# Patient Record
Sex: Female | Born: 1950 | Race: White | Hispanic: No | Marital: Married | Smoking: Never smoker
Health system: Southern US, Community
[De-identification: ages and names within clinical notes are randomized; demographics above are authoritative.]

## PROBLEM LIST (undated history)

## (undated) DIAGNOSIS — I10 Essential (primary) hypertension: Secondary | ICD-10-CM

## (undated) DIAGNOSIS — Z8719 Personal history of other diseases of the digestive system: Secondary | ICD-10-CM

## (undated) DIAGNOSIS — E7800 Pure hypercholesterolemia, unspecified: Secondary | ICD-10-CM

## (undated) DIAGNOSIS — M1990 Unspecified osteoarthritis, unspecified site: Secondary | ICD-10-CM

## (undated) DIAGNOSIS — E119 Type 2 diabetes mellitus without complications: Secondary | ICD-10-CM

## (undated) DIAGNOSIS — E785 Hyperlipidemia, unspecified: Secondary | ICD-10-CM

## (undated) HISTORY — PX: CHOLECYSTECTOMY: SHX55

## (undated) HISTORY — PX: TUBAL LIGATION: SHX77

---

## 2010-07-04 ENCOUNTER — Ambulatory Visit (HOSPITAL_COMMUNITY)
Admission: RE | Admit: 2010-07-04 | Discharge: 2010-07-04 | Payer: Self-pay | Source: Home / Self Care | Attending: Family Medicine | Admitting: Family Medicine

## 2010-07-18 ENCOUNTER — Other Ambulatory Visit: Payer: Self-pay | Admitting: Family Medicine

## 2010-07-18 DIAGNOSIS — R928 Other abnormal and inconclusive findings on diagnostic imaging of breast: Secondary | ICD-10-CM

## 2010-07-24 ENCOUNTER — Ambulatory Visit (HOSPITAL_COMMUNITY)
Admission: RE | Admit: 2010-07-24 | Discharge: 2010-07-24 | Disposition: A | Discharge status: Home / Self Care | Payer: BC Managed Care – PPO | Source: Ambulatory Visit | Attending: Family Medicine | Admitting: Family Medicine

## 2010-07-24 DIAGNOSIS — R928 Other abnormal and inconclusive findings on diagnostic imaging of breast: Secondary | ICD-10-CM

## 2011-02-06 ENCOUNTER — Other Ambulatory Visit: Payer: Self-pay | Admitting: Family Medicine

## 2011-02-06 DIAGNOSIS — R928 Other abnormal and inconclusive findings on diagnostic imaging of breast: Secondary | ICD-10-CM

## 2011-02-20 ENCOUNTER — Ambulatory Visit
Admission: RE | Admit: 2011-02-20 | Discharge: 2011-02-20 | Disposition: A | Discharge status: Home / Self Care | Payer: BC Managed Care – PPO | Source: Ambulatory Visit | Attending: Family Medicine | Admitting: Family Medicine

## 2011-02-20 DIAGNOSIS — R928 Other abnormal and inconclusive findings on diagnostic imaging of breast: Secondary | ICD-10-CM

## 2013-02-23 ENCOUNTER — Other Ambulatory Visit (HOSPITAL_COMMUNITY): Payer: Self-pay | Admitting: Family Medicine

## 2013-04-08 ENCOUNTER — Telehealth: Payer: Self-pay | Admitting: Family Medicine

## 2013-04-08 ENCOUNTER — Other Ambulatory Visit (HOSPITAL_COMMUNITY): Payer: Self-pay | Admitting: Family Medicine

## 2013-04-08 NOTE — Telephone Encounter (Signed)
Patient would like to know if Dr. Gerda Diss would refill lisinopril-hydrochlorothiazide (PRINZIDE,ZESTORETIC) 10-12.5 MG per tablet until the first of the year.  States her Insurance will be changing as well as she has an appointment elsewhere in November and she would like to bring those results with her as well to the office visit.  Call patient to discuss further.

## 2013-04-08 NOTE — Telephone Encounter (Signed)
Pt has not been seen since EPIC. We last refilled this prescription on 02/23/13 stating she needs an office visit for further refills.

## 2013-04-13 MED ORDER — LISINOPRIL-HYDROCHLOROTHIAZIDE 10-12.5 MG PO TABS: ORAL_TABLET | ORAL | Status: DC

## 2013-04-13 NOTE — Telephone Encounter (Signed)
Rx sent electronically to pharmacy. Patient notified. 

## 2013-04-13 NOTE — Telephone Encounter (Signed)
Tell her generally we want to see htn pts q 6 mos but will do this time

## 2013-05-04 ENCOUNTER — Encounter: Payer: Self-pay | Admitting: Family Medicine

## 2013-05-04 ENCOUNTER — Ambulatory Visit (INDEPENDENT_AMBULATORY_CARE_PROVIDER_SITE_OTHER): Payer: BC Managed Care – PPO | Admitting: Family Medicine

## 2013-05-04 VITALS — BP 132/80 | Ht 63.75 in | Wt 191.4 lb

## 2013-05-04 DIAGNOSIS — E781 Pure hyperglyceridemia: Secondary | ICD-10-CM | POA: Insufficient documentation

## 2013-05-04 DIAGNOSIS — Z23 Encounter for immunization: Secondary | ICD-10-CM

## 2013-05-04 DIAGNOSIS — E785 Hyperlipidemia, unspecified: Secondary | ICD-10-CM

## 2013-05-04 DIAGNOSIS — I10 Essential (primary) hypertension: Secondary | ICD-10-CM | POA: Insufficient documentation

## 2013-05-04 MED ORDER — LISINOPRIL-HYDROCHLOROTHIAZIDE 10-12.5 MG PO TABS: ORAL_TABLET | ORAL | Status: DC

## 2013-05-04 NOTE — Progress Notes (Signed)
  Subjective:    Patient ID: ARTRICE KRAKER, female    DOB: November 29, 1950, 62 y.o.   MRN: [id number]  HPI  Patient arrives to follow up on blood pressure. Bp elsewhere is genrally good when on the med, not when not. Trying to watch salt intake. Not exercising as much as she had hoped.  Not so good on diet,  Five and two keeps kids during the day.  No problems or concerns.  Exercising by staying active  History of elevated triglycerides and cholesterol. We have spoken with her multiple times about this in the past. She has always been reluctant to get further testing. There is family history of hyperlipidemia.  Review of Systems No chest pain no headache no back pain no abdominal pain ROS otherwise negative    Objective:   Physical Exam Alert HEENT normal. Lungs clear. Heart regular in rhythm. Ankles no significant edema pulses good sensation good       Assessment & Plan:  Impression 1 hypertension good control. #2 hyperlipidemia status uncertain long discussion held. Patient's triglycerides often exceed 4-500. This is definitely a big risk factor. Plan appropriate blood work your triglycerides still up patient will and a take medicine. 25 minutes spent most in discussion flu shot today. Check every 6 months WSL

## 2014-04-12 ENCOUNTER — Ambulatory Visit (INDEPENDENT_AMBULATORY_CARE_PROVIDER_SITE_OTHER): Payer: 59 | Admitting: Family Medicine

## 2014-04-12 ENCOUNTER — Encounter: Payer: Self-pay | Admitting: Family Medicine

## 2014-04-12 VITALS — BP 130/90 | Ht 63.75 in | Wt 190.5 lb

## 2014-04-12 DIAGNOSIS — Z23 Encounter for immunization: Secondary | ICD-10-CM

## 2014-04-12 DIAGNOSIS — J310 Chronic rhinitis: Secondary | ICD-10-CM

## 2014-04-12 DIAGNOSIS — I10 Essential (primary) hypertension: Secondary | ICD-10-CM

## 2014-04-12 DIAGNOSIS — J329 Chronic sinusitis, unspecified: Secondary | ICD-10-CM

## 2014-04-12 DIAGNOSIS — Z79899 Other long term (current) drug therapy: Secondary | ICD-10-CM

## 2014-04-12 DIAGNOSIS — E781 Pure hyperglyceridemia: Secondary | ICD-10-CM

## 2014-04-12 LAB — BASIC METABOLIC PANEL
BUN: 19 mg/dL (ref 6–23)
CHLORIDE: 101 meq/L (ref 96–112)
CO2: 28 mEq/L (ref 19–32)
Calcium: 10.2 mg/dL (ref 8.4–10.5)
Creat: 0.88 mg/dL (ref 0.50–1.10)
Glucose, Bld: 93 mg/dL (ref 70–99)
POTASSIUM: 5.1 meq/L (ref 3.5–5.3)
Sodium: 139 mEq/L (ref 135–145)

## 2014-04-12 LAB — LIPID PANEL
Cholesterol: 326 mg/dL — ABNORMAL HIGH (ref 0–200)
HDL: 45 mg/dL (ref >39)
TRIGLYCERIDES: 464 mg/dL — AB (ref <150)
Total CHOL/HDL Ratio: 7.2 Ratio

## 2014-04-12 LAB — HEPATIC FUNCTION PANEL
ALBUMIN: 4.6 g/dL (ref 3.5–5.2)
ALT: 18 U/L (ref 0–35)
AST: 19 U/L (ref 0–37)
Alkaline Phosphatase: 56 U/L (ref 39–117)
Bilirubin, Direct: <0.1 mg/dL (ref 0.0–0.3)
TOTAL PROTEIN: 7.4 g/dL (ref 6.0–8.3)
Total Bilirubin: 0.3 mg/dL (ref 0.2–1.2)

## 2014-04-12 MED ORDER — GENTAMICIN SULFATE 0.3 % OP SOLN: 2.0000 [drp] | Freq: Four times a day (QID) | OPHTHALMIC | Status: DC

## 2014-04-12 MED ORDER — LISINOPRIL-HYDROCHLOROTHIAZIDE 10-12.5 MG PO TABS: ORAL_TABLET | ORAL | Status: DC

## 2014-04-12 MED ORDER — AZITHROMYCIN 250 MG PO TABS: ORAL_TABLET | ORAL | Status: DC

## 2014-04-12 NOTE — Progress Notes (Signed)
   Subjective:    Patient ID: Diane Watkins, female    DOB: Jun 02, 1951, 63 y.o.   MRN: [id number]  Hypertension This is a chronic problem. The current episode started more than 1 year ago. The problem has been gradually improving since onset. The problem is controlled. There are no associated agents to hypertension. There are no known risk factors for coronary artery disease. Treatments tried: lisinopril-hctz. The current treatment provides significant improvement. There are no compliance problems.    Patient states that her left eye has redness that has been present for about 3 days. 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  Needs b w,   Hx of high triglycerides, not watching diet til recently. Not exercising that much. Compliant with diet only recently. Some family history of elevated triglycerides also.  Pt took flu shot  Tod flu shot    No knoewn exposureallergies act up,   Review of Systems No headache no chest pain and back pain abdominal pain no change in bowel habits no blood in stool ROS otherwise negative    Objective:   Physical Exam Alert no acute distress. Blood pressure good on repeat.HEENT moderate nasal congestion frontal tenderness left eye somewhat crusty pharynx normal neck supple. Lungs clear heart regular rate and rhythm.       Assessment & Plan:  Impression 1 hypertension good control #2 hyperlipidemia status uncertain. #3 rhinosinusitis with accompanying conjunctivitis discuss plan antibiotics prescribed. Symptomatically care discussed. Appropriate blood work. Medications refilled. Diet exercise discussed. WS L

## 2014-04-21 ENCOUNTER — Other Ambulatory Visit: Payer: Self-pay | Admitting: *Deleted

## 2014-04-21 DIAGNOSIS — E785 Hyperlipidemia, unspecified: Secondary | ICD-10-CM

## 2014-04-21 DIAGNOSIS — Z79899 Other long term (current) drug therapy: Secondary | ICD-10-CM

## 2014-04-21 MED ORDER — ATORVASTATIN CALCIUM 40 MG PO TABS: 40.0000 mg | ORAL_TABLET | Freq: Every day | ORAL | Status: DC

## 2014-04-24 ENCOUNTER — Telehealth: Payer: Self-pay | Admitting: Family Medicine

## 2014-04-24 NOTE — Telephone Encounter (Signed)
Her chol is more dangerous than her trigly, but we'll honor her choice. Fenofibrate 160 mg qhs numb thirty six ref

## 2014-04-24 NOTE — Telephone Encounter (Signed)
Pt states she did not fill her lipitor script but she has decided that she does not want to take this  Med and wants to try the generic for [place]

## 2014-04-25 MED ORDER — FENOFIBRATE 160 MG PO TABS: 160.0000 mg | ORAL_TABLET | Freq: Every day | ORAL | Status: DC

## 2014-04-26 NOTE — Telephone Encounter (Signed)
Fenofibrate was ordered on 11/10

## 2014-04-28 NOTE — Telephone Encounter (Signed)
Discussed with patient. Patient verbalized understanding. 

## 2014-05-03 ENCOUNTER — Telehealth: Payer: Self-pay | Admitting: Family Medicine

## 2014-05-03 NOTE — Telephone Encounter (Signed)
As long as pt understands her current chol and trigly level increases risk of stroke and heart attack we will honor her decision

## 2014-05-03 NOTE — Telephone Encounter (Signed)
Patient called stating medication you prescribe was too expensive to get and she was going to exercise more and eat more vegetable for now.

## 2014-05-03 NOTE — Telephone Encounter (Signed)
Patient advised her current chol and trigly level increases risk of stroke and heart attack. Patient verbalized understanding and stated she wants to hold on meds at this time.

## 2014-05-16 ENCOUNTER — Encounter: Payer: 59 | Admitting: Nurse Practitioner

## 2014-05-17 ENCOUNTER — Other Ambulatory Visit: Payer: Self-pay | Admitting: Nurse Practitioner

## 2014-05-17 ENCOUNTER — Ambulatory Visit (INDEPENDENT_AMBULATORY_CARE_PROVIDER_SITE_OTHER): Payer: 59 | Admitting: Nurse Practitioner

## 2014-05-17 ENCOUNTER — Encounter: Payer: Self-pay | Admitting: Nurse Practitioner

## 2014-05-17 VITALS — BP 128/82 | Ht 63.75 in | Wt 188.8 lb

## 2014-05-17 DIAGNOSIS — E781 Pure hyperglyceridemia: Secondary | ICD-10-CM

## 2014-05-17 DIAGNOSIS — M8580 Other specified disorders of bone density and structure, unspecified site: Secondary | ICD-10-CM

## 2014-05-17 DIAGNOSIS — Z124 Encounter for screening for malignant neoplasm of cervix: Secondary | ICD-10-CM

## 2014-05-17 DIAGNOSIS — Z1231 Encounter for screening mammogram for malignant neoplasm of breast: Secondary | ICD-10-CM

## 2014-05-17 DIAGNOSIS — Z0000 Encounter for general adult medical examination without abnormal findings: Secondary | ICD-10-CM

## 2014-05-17 NOTE — Progress Notes (Signed)
   Subjective:    Patient ID: Diane Watkins, female    DOB: 1951-05-25, 63 y.o.   MRN: [id number]  HPI presents for her wellness physical. Same sexual partner. No vaginal bleeding or pelvic pain. Regular vision and dental exams. Has had a skin cancer screening. Very active lifestyle. Overall healthy diet. Insurance will change in January, not sure what will be covered.    Review of Systems  Constitutional: Negative for fever, activity change, appetite change and fatigue.  HENT: Negative for dental problem, ear pain, sinus pressure and sore throat.   Respiratory: Negative for cough, chest tightness, shortness of breath and wheezing.   Cardiovascular: Negative for chest pain.  Gastrointestinal: Negative for nausea, vomiting, abdominal pain, diarrhea, constipation and abdominal distention.  Genitourinary: Negative for dysuria, urgency, frequency, vaginal bleeding, vaginal discharge, enuresis, difficulty urinating, genital sores, vaginal pain and pelvic pain.  Musculoskeletal: Positive for arthralgias.       Objective:   Physical Exam  Constitutional: She is oriented to person, place, and time. She appears well-developed. No distress.  HENT:  Right Ear: External ear normal.  Left Ear: External ear normal.  Mouth/Throat: Oropharynx is clear and moist.  Neck: Normal range of motion. Neck supple. No tracheal deviation present. No thyromegaly present.  Cardiovascular: Normal rate, regular rhythm and normal heart sounds.  Exam reveals no gallop.   No murmur heard. Pulmonary/Chest: Effort normal and breath sounds normal.  Abdominal: Soft. She exhibits no distension. There is no tenderness.  Genitourinary: Vagina normal and uterus normal. No vaginal discharge found.  External GU: pale and dry; vagina no discharge mild irritation.no CMT; bimanual exam: no tenderness or obvious masses; ovaries nonpalp but exam limited due to abd girth. Rectal exam: no masses; no stool for hemoccult.    Musculoskeletal: She exhibits no edema.  Lymphadenopathy:    She has no cervical adenopathy.  Neurological: She is alert and oriented to person, place, and time.  Skin: Skin is warm and dry. No rash noted.  Psychiatric: She has a normal mood and affect. Her behavior is normal. Thought content normal.  Vitals reviewed. Breasts: slightly dense tissue; no masses; axillae no adenopathy.        Assessment & Plan:   Problem List Items Addressed This Visit      Musculoskeletal and Integument   Osteopenia   Relevant Orders      DG Bone Density     Other   Hypertriglyceridemia    Other Visit Diagnoses    Routine general medical examination at a health care facility    -  Primary    Screening for cervical cancer        Relevant Orders       Pap IG w/ reflex to HPV when ASC-U       Lengthy discussion about TG. Minimal improvement in 3 years. Still above 400. Strongly recommend medication. Could not afford Fenofibrate. Reluctant to take Lipitor due to potential side effects. Explained that benefits outweigh risk. Risk of uncontrolled TG include stroke and MI. Also, considering her father's history this is probably genetic. May consider Crestor depending on cost. Will check into this. Defers colonoscopy and Zostavax but given Rx and info. Also plans to get DT at pharmacy. Recommend regular exercise, healthy low fat diet and weight loss. Continue daily low dose ASA.  Return in about 6 months (around 11/16/2014).

## 2014-05-18 LAB — PAP IG W/ RFLX HPV ASCU

## 2014-05-23 ENCOUNTER — Ambulatory Visit (HOSPITAL_COMMUNITY)
Admission: RE | Admit: 2014-05-23 | Discharge: 2014-05-23 | Disposition: A | Discharge status: Home / Self Care | Payer: 59 | Source: Ambulatory Visit | Attending: Nurse Practitioner | Admitting: Nurse Practitioner

## 2014-05-23 DIAGNOSIS — M8580 Other specified disorders of bone density and structure, unspecified site: Secondary | ICD-10-CM | POA: Diagnosis not present

## 2014-06-01 ENCOUNTER — Other Ambulatory Visit: Payer: Self-pay | Admitting: Nurse Practitioner

## 2014-06-01 ENCOUNTER — Ambulatory Visit
Admission: RE | Admit: 2014-06-01 | Discharge: 2014-06-01 | Disposition: A | Discharge status: Home / Self Care | Payer: 59 | Source: Ambulatory Visit | Attending: Nurse Practitioner | Admitting: Nurse Practitioner

## 2014-06-01 ENCOUNTER — Encounter (INDEPENDENT_AMBULATORY_CARE_PROVIDER_SITE_OTHER): Payer: Self-pay

## 2014-06-01 DIAGNOSIS — Z1231 Encounter for screening mammogram for malignant neoplasm of breast: Secondary | ICD-10-CM

## 2014-08-31 ENCOUNTER — Telehealth: Payer: Self-pay | Admitting: Family Medicine

## 2014-08-31 DIAGNOSIS — Z79899 Other long term (current) drug therapy: Secondary | ICD-10-CM

## 2014-08-31 DIAGNOSIS — E785 Hyperlipidemia, unspecified: Secondary | ICD-10-CM

## 2014-08-31 NOTE — Telephone Encounter (Signed)
[place] bw orders are in. Need to let pt know to go to [facility]

## 2014-08-31 NOTE — Telephone Encounter (Signed)
Pt has 6 month follow up, questions if she'll need lab work, please advise and call pt when done Appt here is 10/02/14

## 2014-08-31 NOTE — Telephone Encounter (Signed)
Last labs 04/12/14 lipid, liver, bmp. Was told to repeat lip and liver in 3 months. Does she need any additional labs

## 2014-08-31 NOTE — Telephone Encounter (Signed)
Ov lipo lliv

## 2014-09-01 NOTE — Telephone Encounter (Signed)
[facility]

## 2014-09-04 NOTE — Telephone Encounter (Signed)
Notified patient that blood work has been ordered and to report to [facility].

## 2014-09-27 LAB — LIPID PANEL
CHOL/HDL RATIO: 6.4 ratio — AB (ref 0.0–4.4)
Cholesterol, Total: 255 mg/dL — ABNORMAL HIGH (ref 100–199)
HDL: 40 mg/dL (ref >39)
LDL CALC: 144 mg/dL — AB (ref 0–99)
Triglycerides: 355 mg/dL — ABNORMAL HIGH (ref 0–149)
VLDL CHOLESTEROL CAL: 71 mg/dL — AB (ref 5–40)

## 2014-09-27 LAB — HEPATIC FUNCTION PANEL
ALBUMIN: 4.2 g/dL (ref 3.6–4.8)
ALK PHOS: 55 IU/L (ref 39–117)
ALT: 18 IU/L (ref 0–32)
AST: 20 IU/L (ref 0–40)
BILIRUBIN, DIRECT: 0.07 mg/dL (ref 0.00–0.40)
Bilirubin Total: 0.3 mg/dL (ref 0.0–1.2)
TOTAL PROTEIN: 6.6 g/dL (ref 6.0–8.5)

## 2014-10-02 ENCOUNTER — Encounter: Payer: Self-pay | Admitting: Family Medicine

## 2014-10-02 ENCOUNTER — Ambulatory Visit (INDEPENDENT_AMBULATORY_CARE_PROVIDER_SITE_OTHER): Payer: 59 | Admitting: Family Medicine

## 2014-10-02 ENCOUNTER — Ambulatory Visit (HOSPITAL_COMMUNITY)
Admission: RE | Admit: 2014-10-02 | Discharge: 2014-10-02 | Disposition: A | Discharge status: Home / Self Care | Payer: 59 | Source: Ambulatory Visit | Attending: Family Medicine | Admitting: Family Medicine

## 2014-10-02 VITALS — BP 132/80 | Ht 63.75 in | Wt 186.2 lb

## 2014-10-02 DIAGNOSIS — M25562 Pain in left knee: Secondary | ICD-10-CM

## 2014-10-02 DIAGNOSIS — E781 Pure hyperglyceridemia: Secondary | ICD-10-CM

## 2014-10-02 DIAGNOSIS — M25572 Pain in left ankle and joints of left foot: Secondary | ICD-10-CM

## 2014-10-02 DIAGNOSIS — M25472 Effusion, left ankle: Secondary | ICD-10-CM | POA: Insufficient documentation

## 2014-10-02 DIAGNOSIS — I10 Essential (primary) hypertension: Secondary | ICD-10-CM | POA: Diagnosis not present

## 2014-10-02 MED ORDER — DICLOFENAC SODIUM 75 MG PO TBEC: 75.0000 mg | DELAYED_RELEASE_TABLET | Freq: Two times a day (BID) | ORAL | Status: DC

## 2014-10-02 MED ORDER — LISINOPRIL-HYDROCHLOROTHIAZIDE 10-12.5 MG PO TABS: ORAL_TABLET | ORAL | Status: DC

## 2014-10-02 NOTE — Progress Notes (Signed)
   Subjective:    Patient ID: Diane Watkins, female    DOB: 18-Apr-1951, 64 y.o.   MRN: [id number]  Hypertension This is a chronic problem. The current episode started more than 1 year ago. The problem has been gradually improving since onset. The problem is controlled. There are no associated agents to hypertension. There are no known risk factors for coronary artery disease. Treatments tried: lisinopril-hctz. The current treatment provides significant improvement. There are no compliance problems.    Patient states that she has been having pain in her left knee and left ankle. This has been present for about 4 months now. Happened right after christmas, playing with the grandkids, seemed to twist knee, very painful, actually used a walker few days   Patient trying to work on cholesterol intake. Has cut down fats. Also taking omega supplements. Not able to exercise much with knee pain.   Still painful tho better, wear s knee brace,  otc med tried some ibuprofen and used prn, only when bad,   Mom has arthritis issues  Results for orders placed or performed in visit on 08/31/14  Lipid panel  Result Value Ref Range   Cholesterol, Total 255 (H) 100 - 199 mg/dL   Triglycerides 355 (H) 0 - 149 mg/dL   HDL 40 >39 mg/dL   VLDL Cholesterol Cal 71 (H) 5 - 40 mg/dL   LDL Calculated 144 (H) 0 - 99 mg/dL   Chol/HDL Ratio 6.4 (H) 0.0 - 4.4 ratio units  Hepatic function panel  Result Value Ref Range   Total Protein 6.6 6.0 - 8.5 g/dL   Albumin 4.2 3.6 - 4.8 g/dL   Bilirubin Total 0.3 0.0 - 1.2 mg/dL   Bilirubin, Direct 0.07 0.00 - 0.40 mg/dL   Alkaline Phosphatase 55 39 - 117 IU/L   AST 20 0 - 40 IU/L   ALT 18 0 - 32 IU/L     Review of Systems No headache no chest pain no back pain no abdominal pain no change in bowel habits    Objective:   Physical Exam  Alert no acute distress. HEENT normal. Lungs clear. Heart regular rate and rhythm. Ankles without edema Left knee positive  crepitations no obvious effusion or joint line tenderness or laxity     Assessment & Plan:  Impression #1 hypertension good control discussed #2 hyperlipidemia not good control also other risk factors and family history of heart disease discussed at length #3 progressive knee pain likely arthritis, though with flare occurring may well have experienced a meniscal or ligament strain plan patient declines cholesterol medicine. Maintain blood pressure medicine. X-ray involved knee. Voltaren twice a day with food when necessary. WSL

## 2015-04-03 ENCOUNTER — Ambulatory Visit: Payer: 59 | Admitting: Family Medicine

## 2015-05-01 ENCOUNTER — Ambulatory Visit (INDEPENDENT_AMBULATORY_CARE_PROVIDER_SITE_OTHER): Payer: 59 | Admitting: Family Medicine

## 2015-05-01 ENCOUNTER — Encounter: Payer: Self-pay | Admitting: Family Medicine

## 2015-05-01 VITALS — BP 128/82 | Ht 63.75 in | Wt 194.2 lb

## 2015-05-01 DIAGNOSIS — I10 Essential (primary) hypertension: Secondary | ICD-10-CM

## 2015-05-01 DIAGNOSIS — Z23 Encounter for immunization: Secondary | ICD-10-CM | POA: Diagnosis not present

## 2015-05-01 DIAGNOSIS — E785 Hyperlipidemia, unspecified: Secondary | ICD-10-CM

## 2015-05-01 DIAGNOSIS — M129 Arthropathy, unspecified: Secondary | ICD-10-CM | POA: Diagnosis not present

## 2015-05-01 DIAGNOSIS — M8580 Other specified disorders of bone density and structure, unspecified site: Secondary | ICD-10-CM | POA: Diagnosis not present

## 2015-05-01 DIAGNOSIS — M170 Bilateral primary osteoarthritis of knee: Secondary | ICD-10-CM

## 2015-05-01 MED ORDER — LISINOPRIL 20 MG PO TABS: 20.0000 mg | ORAL_TABLET | Freq: Every day | ORAL | Status: DC

## 2015-05-01 NOTE — Progress Notes (Signed)
   Subjective:    Patient ID: Diane Watkins, female    DOB: 07/12/1950, 64 y.o.   MRN: [id number]  Hypertension This is a chronic problem. The current episode started more than 1 year ago. Risk factors for coronary artery disease include post-menopausal state. Treatments tried: lisinopril/hctz. There are no compliance problems.     Patient would like to take off the hctz component to help improve her joint pain./  Has gained weight. Patient realizes she has a cholesterol issue. Unfortunately still eating a lot of fatty foods and not exercising.  Both knees aching and hurting  Using no sig meds, then if bad take s antiinflam. Both knees hurt although left more than right. At times needs even swell up. Wonders if HCTZ may be related  Tries not to take meds  Review of Systems No headache no chest pain no back pain abdominal pain no change in bowel habits ROS otherwise negative    Objective:   Physical Exam  Alert vital stable blood pressure 138/76 on repeat HEENT normal lungs clear heart regular in rhythm. Knees crepitations evident left greater than right no obvious effusion no joint laxity      Assessment & Plan:  Impression 1 hypertension decent control, meds reviewed #2 progressive arthritis discussed #3 perceived HCTZ side effect with patient claiming this causes increased arthritis pain #4 hyperlipidemia discussed multiple questions answered plan 25 minutes spent most in discussion. Stop HCTZ component though ACE inhibitor component diet exercise discussed Tylenol when necessary for knee pain prescription anti-inflammatory when necessary for more severe pain follow-up in 6 months lipid panel that if numbers no better will need medication rationale discussed WSL

## 2015-05-01 NOTE — Patient Instructions (Signed)
Call us about a week ahead of next visit so we can do fasting blood work

## 2015-05-16 ENCOUNTER — Other Ambulatory Visit: Payer: Self-pay

## 2015-05-16 DIAGNOSIS — Z1231 Encounter for screening mammogram for malignant neoplasm of breast: Secondary | ICD-10-CM

## 2015-06-13 ENCOUNTER — Ambulatory Visit: Payer: 59

## 2015-06-28 ENCOUNTER — Ambulatory Visit: Payer: 59

## 2015-07-02 ENCOUNTER — Ambulatory Visit (INDEPENDENT_AMBULATORY_CARE_PROVIDER_SITE_OTHER): Payer: BLUE CROSS/BLUE SHIELD | Admitting: Family Medicine

## 2015-07-02 ENCOUNTER — Encounter: Payer: Self-pay | Admitting: Family Medicine

## 2015-07-02 VITALS — BP 132/88 | Temp 98.7°F | Ht 63.0 in | Wt 197.0 lb

## 2015-07-02 DIAGNOSIS — R21 Rash and other nonspecific skin eruption: Secondary | ICD-10-CM

## 2015-07-02 MED ORDER — DOXYCYCLINE HYCLATE 100 MG PO TABS: 100.0000 mg | ORAL_TABLET | Freq: Two times a day (BID) | ORAL | Status: DC

## 2015-07-02 MED ORDER — TRIAMCINOLONE ACETONIDE 0.1 % EX CREA: 1.0000 "application " | TOPICAL_CREAM | Freq: Two times a day (BID) | CUTANEOUS | Status: DC

## 2015-07-02 NOTE — Progress Notes (Signed)
   Subjective:    Patient ID: Diane Watkins, female    DOB: 1950-11-01, 65 y.o.   MRN: [id number]  HPI Itchy rash on right arm. Came up 2 weeks before christmas. Using itch cream. Antibiotic cream and ointment, soap and water, and alcohol.    the original Spot started on left leg  Left a scar starts as a small bump. Erythematous. Sometimes tender swells more with the skin itchy slight discharge and skin breakdown X  Is now developed numerous spots on arms wonders whether she is getting bit by a spider etc.  Review of Systems  no fever no chills no cough no vomiting no diarrhea    Objective:   Physical Exam    alert vitals stable lungs clear heart rare rhythm H&T normal skin multiple discrete erythematous patches with excoriation and moderate amount of tenderness     Assessment & Plan:   impression skin structure infection/folliculitis with secondary cutaneous reaction plan antibiotics prescribed. Local measures discussed twice a day triamcinolone WSL

## 2015-11-19 ENCOUNTER — Ambulatory Visit: Payer: 59 | Admitting: Family Medicine

## 2015-11-20 ENCOUNTER — Telehealth: Payer: Self-pay | Admitting: Family Medicine

## 2015-11-20 DIAGNOSIS — I10 Essential (primary) hypertension: Secondary | ICD-10-CM

## 2015-11-20 DIAGNOSIS — E781 Pure hyperglyceridemia: Secondary | ICD-10-CM

## 2015-11-20 NOTE — Telephone Encounter (Signed)
Lip liv m7 

## 2015-11-20 NOTE — Telephone Encounter (Signed)
Pt is requesting lab orders to be sent over for an upcoming appt. Last labs per epic were: lipid and hepatic on 09/26/14

## 2015-11-20 NOTE — Telephone Encounter (Signed)
Blood work ordered in EPIC. Patient notified. 

## 2015-11-24 LAB — LIPID PANEL
CHOLESTEROL TOTAL: 315 mg/dL — AB (ref 100–199)
Chol/HDL Ratio: 7.7 ratio units — ABNORMAL HIGH (ref 0.0–4.4)
HDL: 41 mg/dL (ref >39)
Triglycerides: 421 mg/dL — ABNORMAL HIGH (ref 0–149)

## 2015-11-24 LAB — HEPATIC FUNCTION PANEL
ALBUMIN: 4.4 g/dL (ref 3.6–4.8)
ALK PHOS: 57 IU/L (ref 39–117)
ALT: 19 IU/L (ref 0–32)
AST: 20 IU/L (ref 0–40)
BILIRUBIN TOTAL: 0.3 mg/dL (ref 0.0–1.2)
BILIRUBIN, DIRECT: 0.09 mg/dL (ref 0.00–0.40)
Total Protein: 6.9 g/dL (ref 6.0–8.5)

## 2015-11-24 LAB — BASIC METABOLIC PANEL
BUN / CREAT RATIO: 21 (ref 12–28)
BUN: 19 mg/dL (ref 8–27)
CALCIUM: 10.2 mg/dL (ref 8.7–10.3)
CO2: 24 mmol/L (ref 18–29)
Chloride: 102 mmol/L (ref 96–106)
Creatinine, Ser: 0.92 mg/dL (ref 0.57–1.00)
GFR, EST AFRICAN AMERICAN: 77 mL/min/{1.73_m2} (ref >59)
GFR, EST NON AFRICAN AMERICAN: 67 mL/min/{1.73_m2} (ref >59)
Glucose: 88 mg/dL (ref 65–99)
POTASSIUM: 5.5 mmol/L — AB (ref 3.5–5.2)
SODIUM: 142 mmol/L (ref 134–144)

## 2015-11-28 ENCOUNTER — Ambulatory Visit (INDEPENDENT_AMBULATORY_CARE_PROVIDER_SITE_OTHER): Payer: BLUE CROSS/BLUE SHIELD | Admitting: Family Medicine

## 2015-11-28 ENCOUNTER — Encounter: Payer: Self-pay | Admitting: Family Medicine

## 2015-11-28 VITALS — BP 124/80 | Ht 63.0 in | Wt 193.2 lb

## 2015-11-28 DIAGNOSIS — E781 Pure hyperglyceridemia: Secondary | ICD-10-CM

## 2015-11-28 DIAGNOSIS — M129 Arthropathy, unspecified: Secondary | ICD-10-CM

## 2015-11-28 DIAGNOSIS — R21 Rash and other nonspecific skin eruption: Secondary | ICD-10-CM | POA: Diagnosis not present

## 2015-11-28 DIAGNOSIS — I10 Essential (primary) hypertension: Secondary | ICD-10-CM

## 2015-11-28 DIAGNOSIS — M170 Bilateral primary osteoarthritis of knee: Secondary | ICD-10-CM

## 2015-11-28 MED ORDER — LISINOPRIL 20 MG PO TABS: 20.0000 mg | ORAL_TABLET | Freq: Every day | ORAL | Status: DC

## 2015-11-28 MED ORDER — DICLOFENAC SODIUM 75 MG PO TBEC: 75.0000 mg | DELAYED_RELEASE_TABLET | Freq: Two times a day (BID) | ORAL | Status: DC

## 2015-11-28 NOTE — Progress Notes (Signed)
   Subjective:    Patient ID: Diane Watkins, female    DOB: [birth year], 65 y.o.   MRN: [id number]  Hypertension This is a chronic problem. The current episode started more than 1 year ago. The problem has been gradually improving since onset. There are no associated agents to hypertension. There are no known risk factors for coronary artery disease. Treatments tried: lisinopril. The current treatment provides moderate improvement. There are no compliance problems.    Patient states she has a tick bite on her left knee and she wants the doctor to take a look at it today.Left a small rash on the. No headache no fever no pain   bp meds faithfully , numb at home overall quite good 130 syst or low 80s. Does not miss a dose of meds. No obvious side effects from medications.  Realizes she has a difficult with lipid control left a small rash. Pruritic in nature no pain no fever no chills no headache. Admits to not working on her diet very well. Also not exercising  Results for orders placed or performed in visit on 11/20/15  Lipid panel  Result Value Ref Range   Cholesterol, Total 315 (H) 100 - 199 mg/dL   Triglycerides 421 (H) 0 - 149 mg/dL   HDL 41 >39 mg/dL   VLDL Cholesterol Cal Comment 5 - 40 mg/dL   LDL Calculated Comment 0 - 99 mg/dL   Chol/HDL Ratio 7.7 (H) 0.0 - 4.4 ratio units  Hepatic function panel  Result Value Ref Range   Total Protein 6.9 6.0 - 8.5 g/dL   Albumin 4.4 3.6 - 4.8 g/dL   Bilirubin Total 0.3 0.0 - 1.2 mg/dL   Bilirubin, Direct 0.09 0.00 - 0.40 mg/dL   Alkaline Phosphatase 57 39 - 117 IU/L   AST 20 0 - 40 IU/L   ALT 19 0 - 32 IU/L  Basic metabolic panel  Result Value Ref Range   Glucose 88 65 - 99 mg/dL   BUN 19 8 - 27 mg/dL   Creatinine, Ser 0.92 0.57 - 1.00 mg/dL   GFR calc non Af Amer 67 >59 mL/min/1.73   GFR calc Af Amer 77 >59 mL/min/1.73   BUN/Creatinine Ratio 21 12 - 28   Sodium 142 134 - 144 mmol/L   Potassium 5.5 (H) 3.5 - 5.2 mmol/L   Chloride  102 96 - 106 mmol/L   CO2 24 18 - 29 mmol/L   Calcium 10.2 8.7 - 10.3 mg/dL    Good sized tick on left kne Review of Systems No headache, no major weight loss or weight gain, no chest pain no back pain abdominal pain no change in bowel habits complete ROS otherwise negative     Objective:   Physical Exam  Alert vitals stable blood pressure excellent on repeat HEENT normal lungs clear heart rare rhythm hands [place] nodes knees positive crepitations left medial knee tick bite small erythematous patch immediately at site      Assessment & Plan:  Impression 1 hypertension good control discussed maintain same #2 osteoarthritis responding to diclofenac ongoing challenges discussed #3 tick bite warning signs discussed no treatment for current rash #4 hyperlipidemia discussed at length patient wishes to hold off on meds now claims she is can work hard on things plan appropriate diet exercise discussed. All medications refilled recheck in 6 months WSL

## 2016-04-05 IMAGING — DX DG ANKLE COMPLETE 3+V*L*
3 series · 3 of 3 positions shown · non-contrast
Comparison: None.

CLINICAL DATA: Left ankle pain and swelling for 4 months. No known
injury.

EXAM:
LEFT ANKLE COMPLETE - 3+ VIEW

[ankle ap]
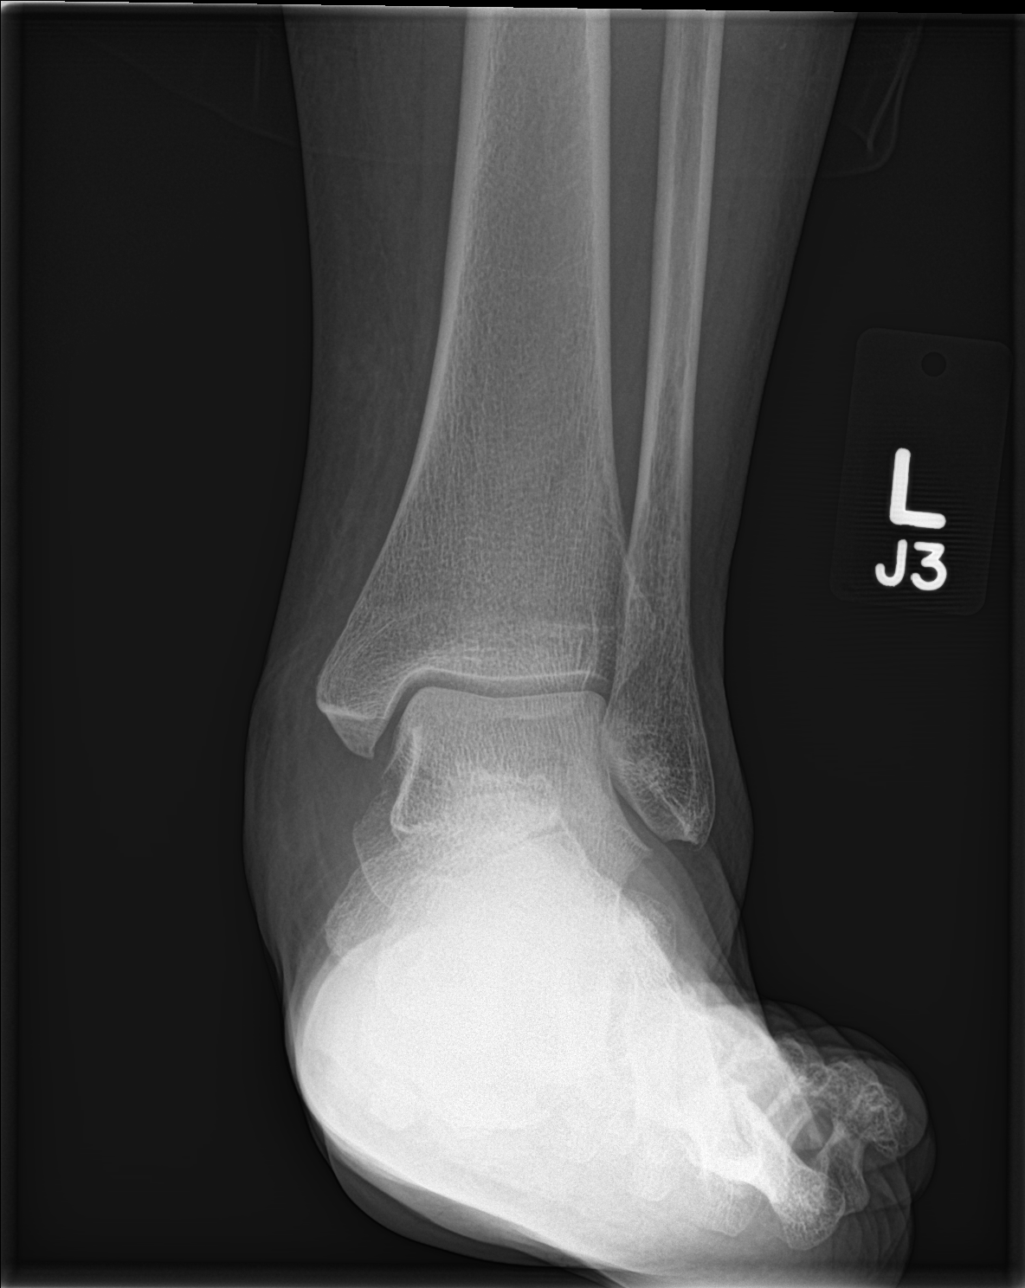

[ankle obl]
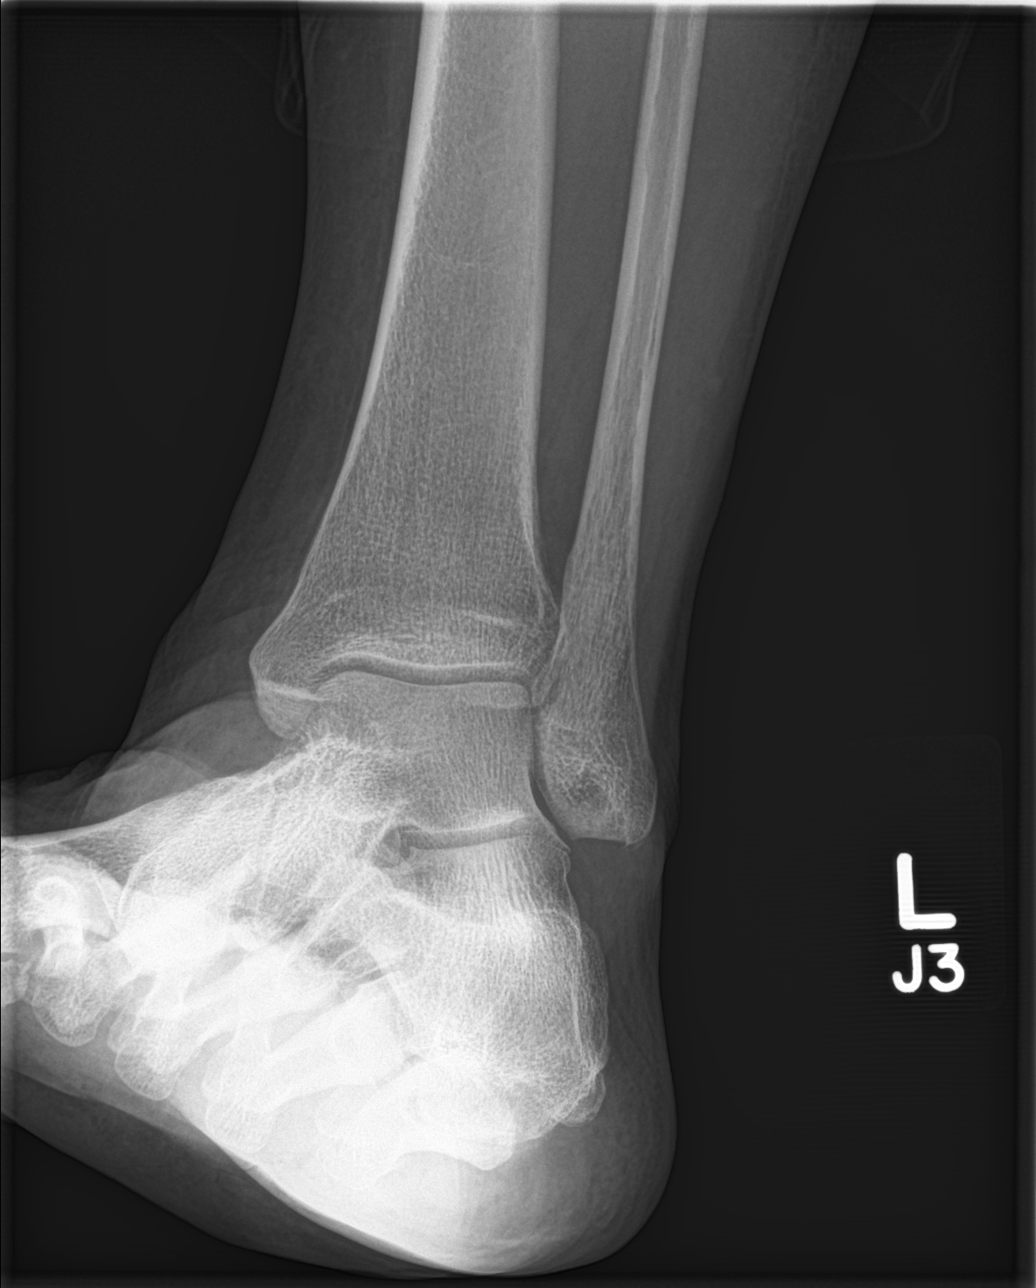

[ankle lat]
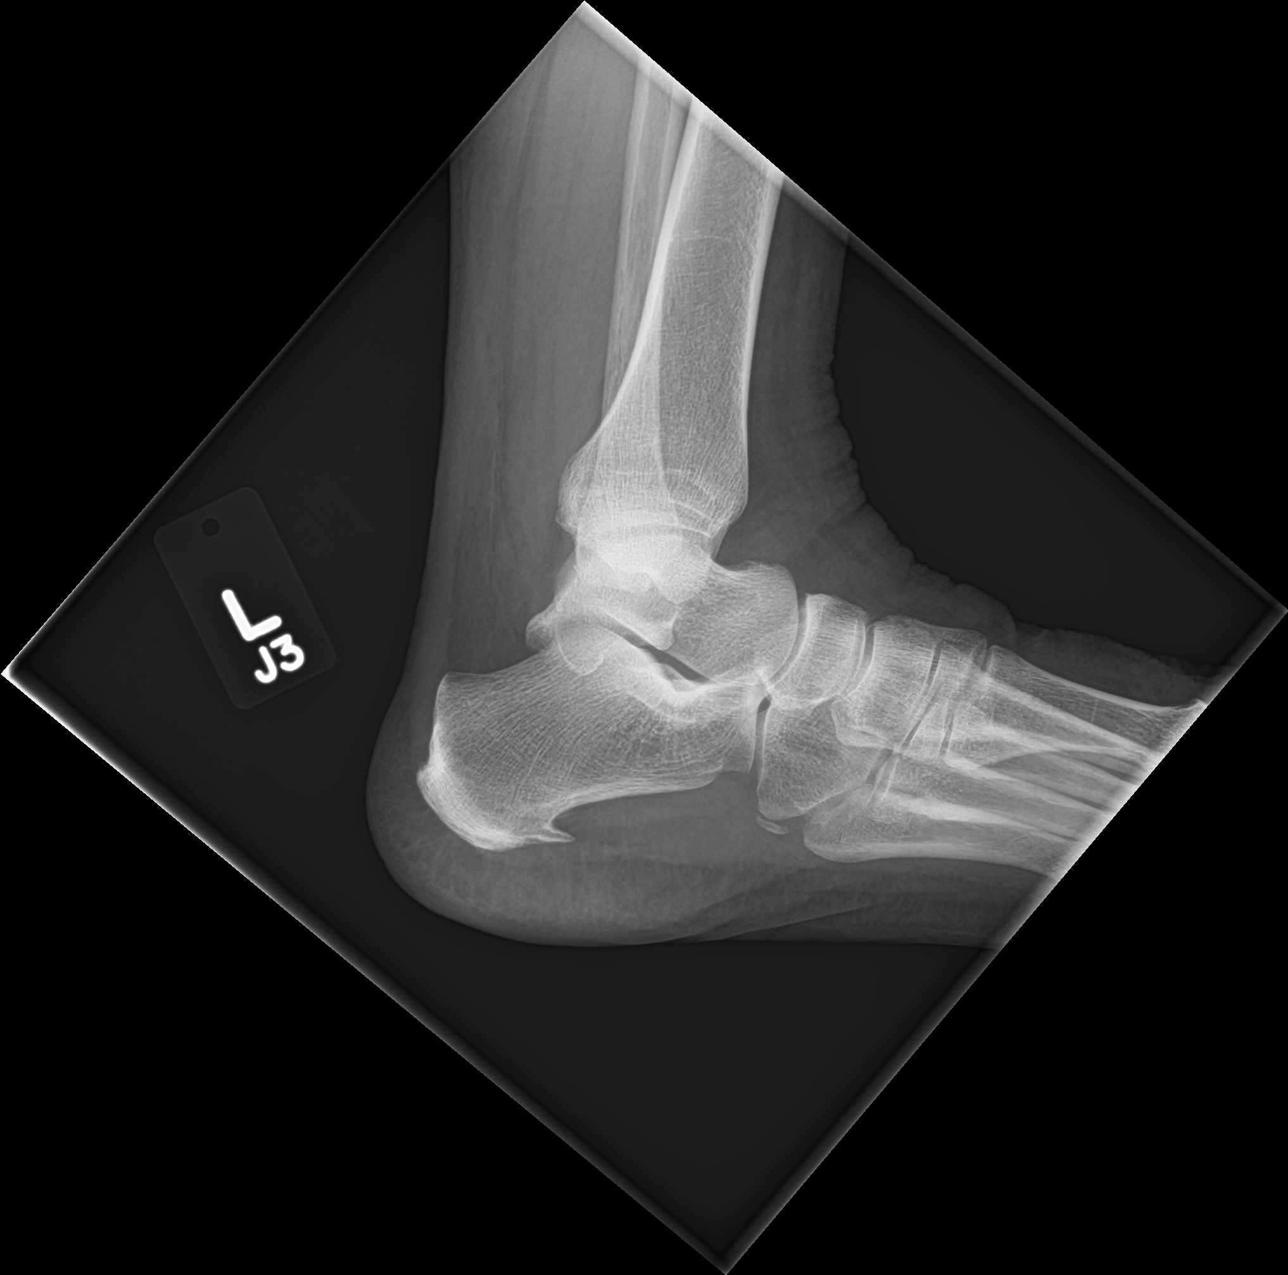

[3 of 3 positions shown; findings below may reference images not displayed]

FINDINGS: There is no evidence of fracture, subluxation or joint effusion.

The joint spaces are unremarkable.

A moderate calcaneal spur is present.

No other focal bony abnormalities are identified.

There may be soft tissue swelling present.
IMPRESSION: Question soft tissue swelling.

Moderate calcaneal spur.

No other abnormalities identified.

## 2016-07-08 ENCOUNTER — Other Ambulatory Visit: Payer: Self-pay | Admitting: Family Medicine

## 2016-07-12 ENCOUNTER — Other Ambulatory Visit: Payer: Self-pay | Admitting: Family Medicine

## 2016-07-15 ENCOUNTER — Other Ambulatory Visit: Payer: Self-pay | Admitting: Family Medicine

## 2016-12-05 ENCOUNTER — Telehealth: Payer: Self-pay | Admitting: Nurse Practitioner

## 2016-12-05 DIAGNOSIS — E781 Pure hyperglyceridemia: Secondary | ICD-10-CM

## 2016-12-05 DIAGNOSIS — I10 Essential (primary) hypertension: Secondary | ICD-10-CM

## 2016-12-05 DIAGNOSIS — Z79899 Other long term (current) drug therapy: Secondary | ICD-10-CM

## 2016-12-05 DIAGNOSIS — M8580 Other specified disorders of bone density and structure, unspecified site: Secondary | ICD-10-CM

## 2016-12-05 NOTE — Telephone Encounter (Signed)
Orders put in. Vit d put on separate order and pt advised to call insurance to see if they will cover before doing bloodwork.

## 2016-12-05 NOTE — Telephone Encounter (Signed)
Met 7, Lipid, liver (vitamin D if insurance will cover); fine to get labs anytime but recommend fasting if possible.

## 2016-12-05 NOTE — Telephone Encounter (Signed)
Patient has an appointment on 12/10/16 with Hoyle Sauer.  She is wanting to know if she needs to have blood work done and also, if she can have this done today?

## 2016-12-06 LAB — BASIC METABOLIC PANEL
BUN / CREAT RATIO: 19 (ref 12–28)
BUN: 18 mg/dL (ref 8–27)
CO2: 25 mmol/L (ref 20–29)
CREATININE: 0.96 mg/dL (ref 0.57–1.00)
Calcium: 10.1 mg/dL (ref 8.7–10.3)
Chloride: 101 mmol/L (ref 96–106)
GFR calc Af Amer: 73 mL/min/{1.73_m2} (ref >59)
GFR calc non Af Amer: 63 mL/min/{1.73_m2} (ref >59)
GLUCOSE: 91 mg/dL (ref 65–99)
Potassium: 5.3 mmol/L — ABNORMAL HIGH (ref 3.5–5.2)
Sodium: 140 mmol/L (ref 134–144)

## 2016-12-06 LAB — HEPATIC FUNCTION PANEL
ALT: 19 IU/L (ref 0–32)
AST: 19 IU/L (ref 0–40)
Albumin: 4.5 g/dL (ref 3.6–4.8)
Alkaline Phosphatase: 68 IU/L (ref 39–117)
BILIRUBIN TOTAL: 0.3 mg/dL (ref 0.0–1.2)
BILIRUBIN, DIRECT: 0.08 mg/dL (ref 0.00–0.40)
TOTAL PROTEIN: 7.2 g/dL (ref 6.0–8.5)

## 2016-12-06 LAB — LIPID PANEL
CHOL/HDL RATIO: 8.1 ratio — AB (ref 0.0–4.4)
Cholesterol, Total: 341 mg/dL — ABNORMAL HIGH (ref 100–199)
HDL: 42 mg/dL (ref >39)
Triglycerides: 438 mg/dL — ABNORMAL HIGH (ref 0–149)

## 2016-12-06 LAB — VITAMIN D 25 HYDROXY (VIT D DEFICIENCY, FRACTURES): Vit D, 25-Hydroxy: 31.2 ng/mL (ref 30.0–100.0)

## 2016-12-10 ENCOUNTER — Ambulatory Visit (INDEPENDENT_AMBULATORY_CARE_PROVIDER_SITE_OTHER): Payer: BLUE CROSS/BLUE SHIELD | Admitting: Nurse Practitioner

## 2016-12-10 ENCOUNTER — Encounter: Payer: Self-pay | Admitting: Nurse Practitioner

## 2016-12-10 VITALS — BP 116/88 | Temp 99.1°F | Ht 63.75 in | Wt 189.0 lb

## 2016-12-10 DIAGNOSIS — E781 Pure hyperglyceridemia: Secondary | ICD-10-CM

## 2016-12-10 DIAGNOSIS — Z79899 Other long term (current) drug therapy: Secondary | ICD-10-CM

## 2016-12-10 DIAGNOSIS — I10 Essential (primary) hypertension: Secondary | ICD-10-CM | POA: Diagnosis not present

## 2016-12-10 MED ORDER — ROSUVASTATIN CALCIUM 10 MG PO TABS: 10.0000 mg | ORAL_TABLET | Freq: Every day | ORAL | 2 refills | Status: DC

## 2016-12-10 MED ORDER — LISINOPRIL 20 MG PO TABS: 20.0000 mg | ORAL_TABLET | Freq: Every day | ORAL | 0 refills | Status: DC

## 2016-12-10 MED ORDER — OMEGA-3-ACID ETHYL ESTERS 1 G PO CAPS: 1.0000 g | ORAL_CAPSULE | Freq: Two times a day (BID) | ORAL | 2 refills | Status: DC

## 2016-12-11 ENCOUNTER — Encounter: Payer: Self-pay | Admitting: Nurse Practitioner

## 2016-12-11 NOTE — Progress Notes (Signed)
Subjective:  Presents for recheck on HTN. Compliant with medication. No CP/ischemic type pain or SOB. No edema. Active. Working in her garden.   Objective:   BP 116/88   Temp 99.1 F (37.3 C) (Oral)   Ht 5' 3.75" (1.619 m)   Wt 189 lb 0.6 oz (85.7 kg)   BMI 32.70 kg/m  NAD. Alert, oriented. Lungs clear. Heart RRR.  Results for orders placed or performed in visit on 12/05/16  VITAMIN D 25 Hydroxy (Vit-D Deficiency, Fractures)  Result Value Ref Range   Vit D, 25-Hydroxy 31.2 30.0 - 100.0 ng/mL  Lipid panel  Result Value Ref Range   Cholesterol, Total 341 (H) 100 - 199 mg/dL   Triglycerides 438 (H) 0 - 149 mg/dL   HDL 42 >39 mg/dL   VLDL Cholesterol Cal Comment 5 - 40 mg/dL   LDL Calculated Comment 0 - 99 mg/dL   Chol/HDL Ratio 8.1 (H) 0.0 - 4.4 ratio  Hepatic function panel  Result Value Ref Range   Total Protein 7.2 6.0 - 8.5 g/dL   Albumin 4.5 3.6 - 4.8 g/dL   Bilirubin Total 0.3 0.0 - 1.2 mg/dL   Bilirubin, Direct 0.08 0.00 - 0.40 mg/dL   Alkaline Phosphatase 68 39 - 117 IU/L   AST 19 0 - 40 IU/L   ALT 19 0 - 32 IU/L  Basic metabolic panel  Result Value Ref Range   Glucose 91 65 - 99 mg/dL   BUN 18 8 - 27 mg/dL   Creatinine, Ser 0.96 0.57 - 1.00 mg/dL   GFR calc non Af Amer 63 >59 mL/min/1.73   GFR calc Af Amer 73 >59 mL/min/1.73   BUN/Creatinine Ratio 19 12 - 28   Sodium 140 134 - 144 mmol/L   Potassium 5.3 (H) 3.5 - 5.2 mmol/L   Chloride 101 96 - 106 mmol/L   CO2 25 20 - 29 mmol/L   Calcium 10.1 8.7 - 10.3 mg/dL   TG have remain elevated over the past 2 years.   Assessment:   Problem List Items Addressed This Visit      Cardiovascular and Mediastinum   Essential hypertension, benign   Relevant Medications   omega-3 acid ethyl esters (LOVAZA) 1 g capsule   lisinopril (PRINIVIL,ZESTRIL) 20 MG tablet   rosuvastatin (CRESTOR) 10 MG tablet     Other   Hypertriglyceridemia - Primary   Relevant Medications   omega-3 acid ethyl esters (LOVAZA) 1 g capsule   lisinopril (PRINIVIL,ZESTRIL) 20 MG tablet   rosuvastatin (CRESTOR) 10 MG tablet   Other Relevant Orders   Lipid panel    Other Visit Diagnoses    High risk medication use       Relevant Orders   Hepatic function panel       Plan:   Meds ordered this encounter  Medications  . omega-3 acid ethyl esters (LOVAZA) 1 g capsule    Sig: Take 1 capsule (1 g total) by mouth 2 (two) times daily.    Dispense:  60 capsule    Refill:  2    Order Specific Question:   Supervising Provider    Answer:   Mikey Kirschner [[id number]]  . lisinopril (PRINIVIL,ZESTRIL) 20 MG tablet    Sig: Take 1 tablet (20 mg total) by mouth daily.    Dispense:  90 tablet    Refill:  0    Order Specific Question:   Supervising Provider    Answer:   Mikey Kirschner [[id number]]  . rosuvastatin (  CRESTOR) 10 MG tablet    Sig: Take 1 tablet (10 mg total) by mouth daily. For cholesterol    Dispense:  30 tablet    Refill:  2    Order Specific Question:   Supervising Provider    Answer:   Mikey Kirschner [[id number]]   Discussed risks associated with uncontrolled TG and lipids.  Agrees to start Crestor. Start Omega 3 either OTC or Lovaza depending on costs. Repeat labs in 6-8 weeks. Recommend preventive health physical. Return in about 6 months (around 06/11/2017) for BP and cholesterol check up; consider preventive health physical .

## 2017-02-13 LAB — HEPATIC FUNCTION PANEL
ALK PHOS: 55 IU/L (ref 39–117)
ALT: 18 IU/L (ref 0–32)
AST: 19 IU/L (ref 0–40)
Albumin: 4.5 g/dL (ref 3.6–4.8)
Bilirubin Total: 0.2 mg/dL (ref 0.0–1.2)
Bilirubin, Direct: 0.07 mg/dL (ref 0.00–0.40)
Total Protein: 7.1 g/dL (ref 6.0–8.5)

## 2017-02-13 LAB — LIPID PANEL
CHOLESTEROL TOTAL: 227 mg/dL — AB (ref 100–199)
Chol/HDL Ratio: 4.6 ratio — ABNORMAL HIGH (ref 0.0–4.4)
HDL: 49 mg/dL (ref >39)
LDL CALC: 115 mg/dL — AB (ref 0–99)
TRIGLYCERIDES: 314 mg/dL — AB (ref 0–149)
VLDL CHOLESTEROL CAL: 63 mg/dL — AB (ref 5–40)

## 2017-03-06 ENCOUNTER — Other Ambulatory Visit: Payer: Self-pay | Admitting: Nurse Practitioner

## 2017-03-22 ENCOUNTER — Other Ambulatory Visit: Payer: Self-pay | Admitting: Nurse Practitioner

## 2017-03-31 ENCOUNTER — Other Ambulatory Visit: Payer: Self-pay | Admitting: Nurse Practitioner

## 2017-06-17 ENCOUNTER — Telehealth: Payer: Self-pay | Admitting: Nurse Practitioner

## 2017-06-17 ENCOUNTER — Other Ambulatory Visit: Payer: Self-pay | Admitting: Family Medicine

## 2017-06-17 ENCOUNTER — Other Ambulatory Visit: Payer: Self-pay | Admitting: Nurse Practitioner

## 2017-06-17 DIAGNOSIS — Z79899 Other long term (current) drug therapy: Secondary | ICD-10-CM

## 2017-06-17 DIAGNOSIS — E781 Pure hyperglyceridemia: Secondary | ICD-10-CM

## 2017-06-17 NOTE — Telephone Encounter (Signed)
Lipid, liver and met 7. Thanks.

## 2017-06-17 NOTE — Telephone Encounter (Signed)
Orders sent

## 2017-06-17 NOTE — Telephone Encounter (Signed)
Pt has an upcoming physical scheduled for 07/15/17 with Hoyle Sauer and is needing lab orders sent over. Last labs per Epic were: hepatic and lipid on 02/12/17

## 2017-06-17 NOTE — Telephone Encounter (Signed)
Left a message asked that pt r/c.

## 2017-06-23 NOTE — Telephone Encounter (Signed)
Patient notified and verbalized understanding. 

## 2017-07-04 LAB — BASIC METABOLIC PANEL
BUN/Creatinine Ratio: 14 (ref 12–28)
BUN: 13 mg/dL (ref 8–27)
CALCIUM: 9.8 mg/dL (ref 8.7–10.3)
CO2: 24 mmol/L (ref 20–29)
CREATININE: 0.95 mg/dL (ref 0.57–1.00)
Chloride: 103 mmol/L (ref 96–106)
GFR calc Af Amer: 73 mL/min/{1.73_m2} (ref >59)
GFR, EST NON AFRICAN AMERICAN: 63 mL/min/{1.73_m2} (ref >59)
GLUCOSE: 102 mg/dL — AB (ref 65–99)
Potassium: 4.9 mmol/L (ref 3.5–5.2)
Sodium: 142 mmol/L (ref 134–144)

## 2017-07-04 LAB — LIPID PANEL
CHOLESTEROL TOTAL: 216 mg/dL — AB (ref 100–199)
Chol/HDL Ratio: 4.6 ratio — ABNORMAL HIGH (ref 0.0–4.4)
HDL: 47 mg/dL (ref >39)
LDL Calculated: 111 mg/dL — ABNORMAL HIGH (ref 0–99)
Triglycerides: 292 mg/dL — ABNORMAL HIGH (ref 0–149)
VLDL Cholesterol Cal: 58 mg/dL — ABNORMAL HIGH (ref 5–40)

## 2017-07-04 LAB — HEPATIC FUNCTION PANEL
ALK PHOS: 55 IU/L (ref 39–117)
ALT: 21 IU/L (ref 0–32)
AST: 19 IU/L (ref 0–40)
Albumin: 4.5 g/dL (ref 3.6–4.8)
Bilirubin Total: 0.2 mg/dL (ref 0.0–1.2)
Bilirubin, Direct: 0.06 mg/dL (ref 0.00–0.40)
Total Protein: 7 g/dL (ref 6.0–8.5)

## 2017-07-15 ENCOUNTER — Ambulatory Visit: Payer: BLUE CROSS/BLUE SHIELD | Admitting: Nurse Practitioner

## 2017-07-15 ENCOUNTER — Encounter: Payer: Self-pay | Admitting: Nurse Practitioner

## 2017-07-15 ENCOUNTER — Other Ambulatory Visit: Payer: Self-pay | Admitting: Nurse Practitioner

## 2017-07-15 VITALS — BP 122/82 | Ht 62.0 in | Wt 199.4 lb

## 2017-07-15 DIAGNOSIS — Z124 Encounter for screening for malignant neoplasm of cervix: Secondary | ICD-10-CM

## 2017-07-15 DIAGNOSIS — M8580 Other specified disorders of bone density and structure, unspecified site: Secondary | ICD-10-CM | POA: Diagnosis not present

## 2017-07-15 DIAGNOSIS — Z01419 Encounter for gynecological examination (general) (routine) without abnormal findings: Secondary | ICD-10-CM

## 2017-07-15 DIAGNOSIS — Z1151 Encounter for screening for human papillomavirus (HPV): Secondary | ICD-10-CM | POA: Diagnosis not present

## 2017-07-15 DIAGNOSIS — Z1231 Encounter for screening mammogram for malignant neoplasm of breast: Secondary | ICD-10-CM | POA: Diagnosis not present

## 2017-07-15 NOTE — Patient Instructions (Signed)
Low dose CT scan of the lungs

## 2017-07-17 LAB — PAP IG AND HPV HIGH-RISK
HPV, high-risk: NEGATIVE
PAP Smear Comment: 0

## 2017-07-18 ENCOUNTER — Encounter: Payer: Self-pay | Admitting: Nurse Practitioner

## 2017-07-18 NOTE — Progress Notes (Addendum)
Subjective:    Patient ID: Diane Watkins, female    DOB: 1951-04-10, 67 y.o.   MRN: [id number]  HPI Presents for her wellness exam. No vaginal bleeding or pelvic pain. Same sexual partner. Needs eye exam. Regular dental care. Just started regular exercise. Joined local [employer].     Review of Systems  Constitutional: Negative for activity change, appetite change and fatigue.  HENT: Negative for dental problem, ear pain, sinus pressure and sore throat.   Respiratory: Negative for cough, chest tightness, shortness of breath and wheezing.   Cardiovascular: Negative for chest pain.  Gastrointestinal: Negative for abdominal distention, abdominal pain, blood in stool, constipation, diarrhea, nausea and vomiting.  Genitourinary: Negative for difficulty urinating, dysuria, enuresis, frequency, genital sores, pelvic pain, urgency, vaginal bleeding and vaginal discharge.   Depression screen PHQ 2/9 07/18/2017  Decreased Interest 0  Down, Depressed, Hopeless 0  PHQ - 2 Score 0        Objective:   Physical Exam  Constitutional: She is oriented to person, place, and time. She appears well-developed. No distress.  Has gained 10 lbs since June.   HENT:  Right Ear: External ear normal.  Left Ear: External ear normal.  Mouth/Throat: Oropharynx is clear and moist.  Neck: Normal range of motion. Neck supple. No tracheal deviation present. No thyromegaly present.  Cardiovascular: Normal rate, regular rhythm and normal heart sounds. Exam reveals no gallop.  No murmur heard. Pulmonary/Chest: Effort normal and breath sounds normal. Right breast exhibits no inverted nipple, no mass, no skin change and no tenderness. Left breast exhibits no mass, no skin change and no tenderness. Breasts are symmetrical.  Axillae no adenopathy.   Abdominal: Soft. She exhibits no distension. There is no tenderness.  Genitourinary: Vagina normal and uterus normal. No vaginal discharge found.  Genitourinary Comments:  External GU: no rashes or lesions. Vagina: no discharge. Bimanual exam: no tenderness or obvious masses. Cervix: small pink polyp noted near os at 3 o'clock. Small open area of erythema on medial part. Sample of area included in PAP smear.   Musculoskeletal: She exhibits no edema.  Lymphadenopathy:    She has no cervical adenopathy.  Neurological: She is alert and oriented to person, place, and time.  Skin: Skin is warm and dry. No rash noted.  Psychiatric: She has a normal mood and affect. Her behavior is normal.  Vitals reviewed.  Recent Results (from the past 2160 hour(s))  Hepatic function panel     Status: None   Collection Time: 07/03/17  8:46 AM  Result Value Ref Range   Total Protein 7.0 6.0 - 8.5 g/dL   Albumin 4.5 3.6 - 4.8 g/dL   Bilirubin Total 0.2 0.0 - 1.2 mg/dL   Bilirubin, Direct 0.06 0.00 - 0.40 mg/dL   Alkaline Phosphatase 55 39 - 117 IU/L   AST 19 0 - 40 IU/L   ALT 21 0 - 32 IU/L  Lipid panel     Status: Abnormal   Collection Time: 07/03/17  8:46 AM  Result Value Ref Range   Cholesterol, Total 216 (H) 100 - 199 mg/dL   Triglycerides 292 (H) 0 - 149 mg/dL   HDL 47 >39 mg/dL   VLDL Cholesterol Cal 58 (H) 5 - 40 mg/dL   LDL Calculated 111 (H) 0 - 99 mg/dL   Chol/HDL Ratio 4.6 (H) 0.0 - 4.4 ratio    Comment:  T. Chol/HDL Ratio                                             Men  Women                               1/2 Avg.Risk  3.4    3.3                                   Avg.Risk  5.0    4.4                                2X Avg.Risk  9.6    7.1                                3X Avg.Risk 23.4   33.3   Basic metabolic panel     Status: Abnormal   Collection Time: 07/03/17  8:46 AM  Result Value Ref Range   Glucose 102 (H) 65 - 99 mg/dL   BUN 13 8 - 27 mg/dL   Creatinine, Ser 0.95 0.57 - 1.00 mg/dL   GFR calc non Af Amer 63 >59 mL/min/1.73   GFR calc Af Amer 73 >59 mL/min/1.73   BUN/Creatinine Ratio 14 12 - 28   Sodium 142 134 -  144 mmol/L   Potassium 4.9 3.5 - 5.2 mmol/L   Chloride 103 96 - 106 mmol/L   CO2 24 20 - 29 mmol/L   Calcium 9.8 8.7 - 10.3 mg/dL           Assessment & Plan:   Problem List Items Addressed This Visit      Musculoskeletal and Integument   Osteopenia   Relevant Orders   DG Bone Density    Other Visit Diagnoses    Well woman exam    -  Primary   Relevant Orders   Pap IG and HPV (high risk) DNA detection (Completed)   Screening for cervical cancer       Relevant Orders   Pap IG and HPV (high risk) DNA detection (Completed)   Screening for HPV (human papillomavirus)       Relevant Orders   Pap IG and HPV (high risk) DNA detection (Completed)   Screening mammogram, encounter for         TG continue to improve. Down from 438. Continue Crestor and repeat labs in 6 months to include A1C since FBS 102. Encouraged regular exercise, healthy diet and weight loss. Continue daily Omega 3 supplement. Declines iFOBT or colonoscopy.  Return in about 6 months (around 01/12/2018) for recheck.

## 2017-07-20 ENCOUNTER — Other Ambulatory Visit: Payer: Self-pay | Admitting: Family Medicine

## 2017-08-04 ENCOUNTER — Ambulatory Visit
Admission: RE | Admit: 2017-08-04 | Discharge: 2017-08-04 | Disposition: A | Discharge status: Home / Self Care | Payer: BLUE CROSS/BLUE SHIELD | Source: Ambulatory Visit | Attending: Nurse Practitioner | Admitting: Nurse Practitioner

## 2017-08-04 DIAGNOSIS — Z1231 Encounter for screening mammogram for malignant neoplasm of breast: Secondary | ICD-10-CM

## 2017-08-04 DIAGNOSIS — M8580 Other specified disorders of bone density and structure, unspecified site: Secondary | ICD-10-CM

## 2017-09-27 ENCOUNTER — Encounter (HOSPITAL_COMMUNITY): Payer: Self-pay | Admitting: Emergency Medicine

## 2017-09-27 ENCOUNTER — Emergency Department (HOSPITAL_COMMUNITY)
Admission: EM | Admit: 2017-09-27 | Discharge: 2017-09-27 | Disposition: A | Discharge status: Home / Self Care | Payer: BLUE CROSS/BLUE SHIELD | Attending: Emergency Medicine | Admitting: Emergency Medicine

## 2017-09-27 ENCOUNTER — Other Ambulatory Visit: Payer: Self-pay

## 2017-09-27 ENCOUNTER — Emergency Department (HOSPITAL_COMMUNITY): Payer: BLUE CROSS/BLUE SHIELD

## 2017-09-27 DIAGNOSIS — Y939 Activity, unspecified: Secondary | ICD-10-CM | POA: Diagnosis not present

## 2017-09-27 DIAGNOSIS — S7002XA Contusion of left hip, initial encounter: Secondary | ICD-10-CM | POA: Insufficient documentation

## 2017-09-27 DIAGNOSIS — I10 Essential (primary) hypertension: Secondary | ICD-10-CM | POA: Diagnosis not present

## 2017-09-27 DIAGNOSIS — Z79899 Other long term (current) drug therapy: Secondary | ICD-10-CM | POA: Diagnosis not present

## 2017-09-27 DIAGNOSIS — E7800 Pure hypercholesterolemia, unspecified: Secondary | ICD-10-CM | POA: Insufficient documentation

## 2017-09-27 DIAGNOSIS — R55 Syncope and collapse: Secondary | ICD-10-CM | POA: Diagnosis not present

## 2017-09-27 DIAGNOSIS — Z7982 Long term (current) use of aspirin: Secondary | ICD-10-CM | POA: Diagnosis not present

## 2017-09-27 DIAGNOSIS — Y92410 Unspecified street and highway as the place of occurrence of the external cause: Secondary | ICD-10-CM | POA: Diagnosis not present

## 2017-09-27 DIAGNOSIS — S40012A Contusion of left shoulder, initial encounter: Secondary | ICD-10-CM | POA: Insufficient documentation

## 2017-09-27 DIAGNOSIS — Y998 Other external cause status: Secondary | ICD-10-CM | POA: Diagnosis not present

## 2017-09-27 DIAGNOSIS — S79912A Unspecified injury of left hip, initial encounter: Secondary | ICD-10-CM | POA: Diagnosis present

## 2017-09-27 HISTORY — DX: Essential (primary) hypertension: I10

## 2017-09-27 HISTORY — DX: Pure hypercholesterolemia, unspecified: E78.00

## 2017-09-27 MED ORDER — TRAMADOL HCL 50 MG PO TABS: 50.0000 mg | ORAL_TABLET | Freq: Four times a day (QID) | ORAL | 0 refills | Status: DC | PRN

## 2017-09-27 NOTE — Discharge Instructions (Addendum)
Follow-up with your family doctor next week for recheck.  Take the Ultram if Tylenol or Motrin does not help with the discomfort
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
## 2017-10-19 ENCOUNTER — Encounter: Payer: Self-pay | Admitting: Nurse Practitioner

## 2017-10-19 ENCOUNTER — Ambulatory Visit: Payer: BLUE CROSS/BLUE SHIELD | Admitting: Nurse Practitioner

## 2017-10-19 VITALS — BP 162/84 | Ht 62.0 in | Wt 196.1 lb

## 2017-10-19 DIAGNOSIS — M25561 Pain in right knee: Secondary | ICD-10-CM

## 2017-10-19 MED ORDER — MELOXICAM 15 MG PO TABS: 15.0000 mg | ORAL_TABLET | Freq: Every day | ORAL | 0 refills | Status: DC

## 2017-10-19 NOTE — Patient Instructions (Signed)
Apply ice or heat Consider using Biofreeze or Lidocaine patch

## 2017-10-20 ENCOUNTER — Encounter: Payer: Self-pay | Admitting: Nurse Practitioner
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
## 2017-10-21 ENCOUNTER — Other Ambulatory Visit (HOSPITAL_COMMUNITY): Payer: Self-pay | Admitting: Orthopedic Surgery

## 2017-10-21 DIAGNOSIS — M25561 Pain in right knee: Secondary | ICD-10-CM

## 2017-10-27 ENCOUNTER — Ambulatory Visit (HOSPITAL_COMMUNITY)
Admission: RE | Admit: 2017-10-27 | Discharge: 2017-10-27 | Disposition: A | Discharge status: Home / Self Care | Payer: BLUE CROSS/BLUE SHIELD | Source: Ambulatory Visit | Attending: Orthopedic Surgery | Admitting: Orthopedic Surgery

## 2017-10-27 DIAGNOSIS — S83241A Other tear of medial meniscus, current injury, right knee, initial encounter: Secondary | ICD-10-CM | POA: Insufficient documentation

## 2017-10-27 DIAGNOSIS — M25461 Effusion, right knee: Secondary | ICD-10-CM | POA: Insufficient documentation

## 2017-10-27 DIAGNOSIS — X58XXXA Exposure to other specified factors, initial encounter: Secondary | ICD-10-CM | POA: Insufficient documentation

## 2017-10-27 DIAGNOSIS — M25561 Pain in right knee: Secondary | ICD-10-CM | POA: Diagnosis present

## 2017-10-27 DIAGNOSIS — M1711 Unilateral primary osteoarthritis, right knee: Secondary | ICD-10-CM | POA: Diagnosis not present

## 2017-11-15 ENCOUNTER — Other Ambulatory Visit: Payer: Self-pay | Admitting: Nurse Practitioner

## 2017-12-29 ENCOUNTER — Other Ambulatory Visit: Payer: Self-pay | Admitting: Nurse Practitioner

## 2018-01-01 ENCOUNTER — Other Ambulatory Visit: Payer: Self-pay | Admitting: Nurse Practitioner

## 2018-01-05 ENCOUNTER — Other Ambulatory Visit: Payer: Self-pay | Admitting: Nurse Practitioner

## 2018-03-17 NOTE — Patient Instructions (Signed)
Your procedure is scheduled on: 03/29/2018  Report to [facility] at   48   AM.  Call this number if you have problems the morning of surgery: [phone]   Do not eat food or drink liquids :After Midnight.      Take these medicines the morning of surgery with A SIP OF WATER: allegra, mobic( if needed).   Do not wear jewelry, make-up or nail polish.  Do not wear lotions, powders, or perfumes. You may wear deodorant.  Do not shave 48 hours prior to surgery.  Do not bring valuables to the hospital.  Contacts, dentures or bridgework may not be worn into surgery.  Leave suitcase in the car. After surgery it may be brought to your room.  For patients admitted to the hospital, checkout time is 11:00 AM the day of discharge.   Patients discharged the day of surgery will not be allowed to drive home.  :     Please read over the following fact sheets that you were given: Coughing and Deep Breathing, Surgical Site Infection Prevention, Anesthesia Post-op Instructions and Care and Recovery After Surgery    Cataract A cataract is a clouding of the lens of the eye. When a lens becomes cloudy, vision is reduced based on the degree and nature of the clouding. Many cataracts reduce vision to some degree. Some cataracts make people more near-sighted as they develop. Other cataracts increase glare. Cataracts that are ignored and become worse can sometimes look white. The white color can be seen through the pupil. CAUSES   Aging. However, cataracts may occur at any age, even in newborns.   Certain drugs.   Trauma to the eye.   Certain diseases such as diabetes.   Specific eye diseases such as chronic inflammation inside the eye or a sudden attack of a rare form of glaucoma.   Inherited or acquired medical problems.  SYMPTOMS   Gradual, progressive drop in vision in the affected eye.   Severe, rapid visual loss. This most often happens when trauma is the cause.  DIAGNOSIS  To detect a cataract,  an eye doctor examines the lens. Cataracts are best diagnosed with an exam of the eyes with the pupils enlarged (dilated) by drops.  TREATMENT  For an early cataract, vision may improve by using different eyeglasses or stronger lighting. If that does not help your vision, surgery is the only effective treatment. A cataract needs to be surgically removed when vision loss interferes with your everyday activities, such as driving, reading, or watching TV. A cataract may also have to be removed if it prevents examination or treatment of another eye problem. Surgery removes the cloudy lens and usually replaces it with a substitute lens (intraocular lens, IOL).  At a time when both you and your doctor agree, the cataract will be surgically removed. If you have cataracts in both eyes, only one is usually removed at a time. This allows the operated eye to heal and be out of danger from any possible problems after surgery (such as infection or poor wound healing). In rare cases, a cataract may be doing damage to your eye. In these cases, your caregiver may advise surgical removal right away. The vast majority of people who have cataract surgery have better vision afterward. HOME CARE INSTRUCTIONS  If you are not planning surgery, you may be asked to do the following:  Use different eyeglasses.   Use stronger or brighter lighting.   Ask your eye doctor about  reducing your medicine dose or changing medicines if it is thought that a medicine caused your cataract. Changing medicines does not make the cataract go away on its own.   Become familiar with your surroundings. Poor vision can lead to injury. Avoid bumping into things on the affected side. You are at a higher risk for tripping or falling.   Exercise extreme care when driving or operating machinery.   Wear sunglasses if you are sensitive to bright light or experiencing problems with glare.  SEEK IMMEDIATE MEDICAL CARE IF:   You have a worsening or  sudden vision loss.   You notice redness, swelling, or increasing pain in the eye.   You have a fever.  Document Released: 06/02/2005 Document Revised: 05/22/2011 Document Reviewed: 01/24/2011 [facility] Patient Information 2012 [place].PATIENT INSTRUCTIONS POST-ANESTHESIA  IMMEDIATELY FOLLOWING SURGERY:  Do not drive or operate machinery for the first twenty four hours after surgery.  Do not make any important decisions for twenty four hours after surgery or while taking narcotic pain medications or sedatives.  If you develop intractable nausea and vomiting or a severe headache please notify your doctor immediately.  FOLLOW-UP:  Please make an appointment with your surgeon as instructed. You do not need to follow up with anesthesia unless specifically instructed to do so.  WOUND CARE INSTRUCTIONS (if applicable):  Keep a dry clean dressing on the anesthesia/puncture wound site if there is drainage.  Once the wound has quit draining you may leave it open to air.  Generally you should leave the bandage intact for twenty four hours unless there is drainage.  If the epidural site drains for more than 36-48 hours please call the anesthesia department.  QUESTIONS?:  Please feel free to call your physician or the hospital operator if you have any questions, and they will be happy to assist you.

## 2018-03-19 ENCOUNTER — Other Ambulatory Visit: Payer: Self-pay | Admitting: Family Medicine

## 2018-03-19 ENCOUNTER — Other Ambulatory Visit: Payer: Self-pay | Admitting: *Deleted

## 2018-03-19 MED ORDER — ROSUVASTATIN CALCIUM 10 MG PO TABS: 10.0000 mg | ORAL_TABLET | Freq: Every day | ORAL | 0 refills | Status: DC

## 2018-03-22 ENCOUNTER — Encounter (HOSPITAL_COMMUNITY)
Admission: RE | Admit: 2018-03-22 | Discharge: 2018-03-22 | Disposition: A | Discharge status: Home / Self Care | Payer: PPO | Source: Ambulatory Visit | Attending: Ophthalmology | Admitting: Ophthalmology

## 2018-03-22 ENCOUNTER — Encounter (HOSPITAL_COMMUNITY): Payer: Self-pay

## 2018-03-22 DIAGNOSIS — Z01818 Encounter for other preprocedural examination: Secondary | ICD-10-CM | POA: Diagnosis not present

## 2018-03-22 HISTORY — DX: Unspecified osteoarthritis, unspecified site: M19.90

## 2018-03-22 LAB — BASIC METABOLIC PANEL
Anion gap: 8 (ref 5–15)
BUN: 15 mg/dL (ref 8–23)
CHLORIDE: 106 mmol/L (ref 98–111)
CO2: 27 mmol/L (ref 22–32)
Calcium: 9.3 mg/dL (ref 8.9–10.3)
Creatinine, Ser: 0.94 mg/dL (ref 0.44–1.00)
GFR calc non Af Amer: >60 mL/min (ref >60)
GLUCOSE: 96 mg/dL (ref 70–99)
Potassium: 4.4 mmol/L (ref 3.5–5.1)
Sodium: 141 mmol/L (ref 135–145)

## 2018-03-22 LAB — CBC
HEMATOCRIT: 41.1 % (ref 36.0–46.0)
HEMOGLOBIN: 13.6 g/dL (ref 12.0–15.0)
MCH: 31.5 pg (ref 26.0–34.0)
MCHC: 33.1 g/dL (ref 30.0–36.0)
MCV: 95.1 fL (ref 78.0–100.0)
Platelets: 268 10*3/uL (ref 150–400)
RBC: 4.32 MIL/uL (ref 3.87–5.11)
RDW: 13.3 % (ref 11.5–15.5)
WBC: 8.1 10*3/uL (ref 4.0–10.5)

## 2018-03-26 MED ORDER — LIDOCAINE HCL (PF) 1 % IJ SOLN
INTRAMUSCULAR | Status: AC
  Filled 2018-03-26: qty 2

## 2018-03-26 MED ORDER — CYCLOPENTOLATE-PHENYLEPHRINE 0.2-1 % OP SOLN
OPHTHALMIC | Status: AC
  Filled 2018-03-26: qty 2

## 2018-03-26 MED ORDER — TETRACAINE HCL 0.5 % OP SOLN
OPHTHALMIC | Status: AC
  Filled 2018-03-26: qty 4

## 2018-03-26 MED ORDER — PHENYLEPHRINE HCL 2.5 % OP SOLN
OPHTHALMIC | Status: AC
  Filled 2018-03-26: qty 15

## 2018-03-26 MED ORDER — NEOMYCIN-POLYMYXIN-DEXAMETH 3.5-10000-0.1 OP SUSP
OPHTHALMIC | Status: AC
Start: 2018-03-26 — End: 2018-03-26
  Filled 2018-03-26: qty 5

## 2018-03-29 ENCOUNTER — Ambulatory Visit (HOSPITAL_COMMUNITY): Payer: PPO | Admitting: Anesthesiology

## 2018-03-29 ENCOUNTER — Encounter (HOSPITAL_COMMUNITY): Payer: Self-pay | Admitting: Anesthesiology

## 2018-03-29 ENCOUNTER — Encounter (HOSPITAL_COMMUNITY)
Admission: RE | Disposition: A | Discharge status: Home / Self Care | Payer: Self-pay | Source: Ambulatory Visit | Attending: Ophthalmology

## 2018-03-29 ENCOUNTER — Ambulatory Visit (HOSPITAL_COMMUNITY)
Admission: RE | Admit: 2018-03-29 | Discharge: 2018-03-29 | Disposition: A | Discharge status: Home / Self Care | Payer: PPO | Source: Ambulatory Visit | Attending: Ophthalmology | Admitting: Ophthalmology

## 2018-03-29 DIAGNOSIS — H2512 Age-related nuclear cataract, left eye: Secondary | ICD-10-CM | POA: Insufficient documentation

## 2018-03-29 DIAGNOSIS — I10 Essential (primary) hypertension: Secondary | ICD-10-CM | POA: Insufficient documentation

## 2018-03-29 DIAGNOSIS — Z7982 Long term (current) use of aspirin: Secondary | ICD-10-CM | POA: Diagnosis not present

## 2018-03-29 DIAGNOSIS — Z79899 Other long term (current) drug therapy: Secondary | ICD-10-CM | POA: Insufficient documentation

## 2018-03-29 HISTORY — PX: CATARACT EXTRACTION W/PHACO: SHX586

## 2018-03-29 SURGERY — PHACOEMULSIFICATION, CATARACT, WITH IOL INSERTION
Anesthesia: Monitor Anesthesia Care | Site: Eye | Laterality: Left

## 2018-03-29 MED ORDER — LIDOCAINE HCL (PF) 1 % IJ SOLN
INTRAMUSCULAR | Status: DC | PRN
  Administered 2018-03-29: .5 mL

## 2018-03-29 MED ORDER — MIDAZOLAM HCL 5 MG/5ML IJ SOLN
INTRAMUSCULAR | Status: DC | PRN
  Administered 2018-03-29: 2 mg via INTRAVENOUS

## 2018-03-29 MED ORDER — LACTATED RINGERS IV SOLN
INTRAVENOUS | Status: DC | PRN
  Administered 2018-03-29: 07:00:00 via INTRAVENOUS

## 2018-03-29 MED ORDER — PROVISC 10 MG/ML IO SOLN
INTRAOCULAR | Status: DC | PRN
  Administered 2018-03-29: 0.85 mL via INTRAOCULAR

## 2018-03-29 MED ORDER — CYCLOPENTOLATE-PHENYLEPHRINE 0.2-1 % OP SOLN
1.0000 [drp] | OPHTHALMIC | Status: AC
  Administered 2018-03-29 (×3): 1 [drp] via OPHTHALMIC

## 2018-03-29 MED ORDER — MIDAZOLAM HCL 2 MG/2ML IJ SOLN
INTRAMUSCULAR | Status: AC
  Filled 2018-03-29: qty 2

## 2018-03-29 MED ORDER — EPINEPHRINE PF 1 MG/ML IJ SOLN
INTRAMUSCULAR | Status: AC
  Filled 2018-03-29: qty 1

## 2018-03-29 MED ORDER — BSS IO SOLN
INTRAOCULAR | Status: DC | PRN
  Administered 2018-03-29: 15 mL via INTRAOCULAR

## 2018-03-29 MED ORDER — NEOMYCIN-POLYMYXIN-DEXAMETH 3.5-10000-0.1 OP SUSP
OPHTHALMIC | Status: DC | PRN
  Administered 2018-03-29: 2 [drp] via OPHTHALMIC

## 2018-03-29 MED ORDER — TETRACAINE HCL 0.5 % OP SOLN
1.0000 [drp] | OPHTHALMIC | Status: AC
  Administered 2018-03-29 (×3): 1 [drp] via OPHTHALMIC

## 2018-03-29 MED ORDER — EPINEPHRINE PF 1 MG/ML IJ SOLN
INTRAOCULAR | Status: DC | PRN
  Administered 2018-03-29: 500 mL

## 2018-03-29 MED ORDER — LIDOCAINE HCL 3.5 % OP GEL
1.0000 "application " | Freq: Once | OPHTHALMIC | Status: AC
  Administered 2018-03-29: 1 via OPHTHALMIC

## 2018-03-29 MED ORDER — PHENYLEPHRINE HCL 2.5 % OP SOLN
1.0000 [drp] | OPHTHALMIC | Status: AC
  Administered 2018-03-29 (×3): 1 [drp] via OPHTHALMIC

## 2018-03-29 MED ORDER — POVIDONE-IODINE 5 % OP SOLN
OPHTHALMIC | Status: DC | PRN
  Administered 2018-03-29: 1 via OPHTHALMIC

## 2018-03-29 SURGICAL SUPPLY — 12 items
CLOTH BEACON ORANGE TIMEOUT ST (SAFETY) ×1 IMPLANT
EYE SHIELD UNIVERSAL CLEAR (GAUZE/BANDAGES/DRESSINGS) ×1 IMPLANT
GLOVE BIOGEL PI IND STRL 7.0 (GLOVE) IMPLANT
GLOVE BIOGEL PI INDICATOR 7.0 (GLOVE) ×2
LENS ALC ACRYL/TECN (Ophthalmic Related) ×1 IMPLANT
NDL HYPO 18GX1.5 BLUNT FILL (NEEDLE) IMPLANT
NEEDLE HYPO 18GX1.5 BLUNT FILL (NEEDLE) ×2 IMPLANT
PAD ARMBOARD 7.5X6 YLW CONV (MISCELLANEOUS) ×1 IMPLANT
SYRINGE LUER LOK 1CC (MISCELLANEOUS) ×1 IMPLANT
TAPE SURG TRANSPORE 1 IN (GAUZE/BANDAGES/DRESSINGS) IMPLANT
TAPE SURGICAL TRANSPORE 1 IN (GAUZE/BANDAGES/DRESSINGS) ×1
WATER STERILE IRR 250ML POUR (IV SOLUTION) ×1 IMPLANT

## 2018-03-29 NOTE — Discharge Instructions (Signed)

## 2018-03-29 NOTE — Anesthesia Preprocedure Evaluation (Addendum)
Anesthesia Evaluation  Patient identified by MRN, date of birth, ID band Patient awake    Reviewed: Allergy & Precautions, H&P , NPO status , Patient's Chart, lab work & pertinent test results, reviewed documented beta blocker date and time   Airway Mallampati: II  TM Distance: >3 FB Neck ROM: full    Dental no notable dental hx.    Pulmonary neg pulmonary ROS,    Pulmonary exam normal breath sounds clear to auscultation       Cardiovascular Exercise Tolerance: Good hypertension, negative cardio ROS   Rhythm:regular Rate:Normal     Neuro/Psych negative neurological ROS  negative psych ROS   GI/Hepatic negative GI ROS, Neg liver ROS,   Endo/Other  negative endocrine ROS  Renal/GU negative Renal ROS  negative genitourinary   Musculoskeletal  (+) Arthritis ,   Abdominal   Peds  Hematology negative hematology ROS (+)   Anesthesia Other Findings   Reproductive/Obstetrics negative OB ROS                            Anesthesia Physical Anesthesia Plan  ASA: II  Anesthesia Plan: MAC   Post-op Pain Management:    Induction:   PONV Risk Score and Plan:   Airway Management Planned:   Additional Equipment:   Intra-op Plan:   Post-operative Plan:   Informed Consent: I have reviewed the patients History and Physical, chart, labs and discussed the procedure including the risks, benefits and alternatives for the proposed anesthesia with the patient or authorized representative who has indicated his/her understanding and acceptance.   Dental Advisory Given  Plan Discussed with: CRNA  Anesthesia Plan Comments:        Anesthesia Quick Evaluation

## 2018-03-29 NOTE — H&P (Signed)
I have reviewed the H&P, the patient was re-examined, and I have identified no interval changes in medical condition and plan of care since the history and physical of record  

## 2018-03-29 NOTE — Transfer of Care (Signed)
Immediate Anesthesia Transfer of Care Note  Patient: Diane Watkins  Procedure(s) Performed: CATARACT EXTRACTION PHACO AND INTRAOCULAR LENS PLACEMENT ([facility]) (Left Eye)  Patient Location: Short Stay  Anesthesia Type:MAC  Level of Consciousness: awake  Airway & Oxygen Therapy: Patient Spontanous Breathing  Post-op Assessment: Report given to RN  Post vital signs: Reviewed  Last Vitals:  Vitals Value Taken Time  BP    Temp    Pulse    Resp    SpO2      Last Pain:  Vitals:   03/29/18 0640  TempSrc: Oral  PainSc: 0-No pain      Patients Stated Pain Goal: 8 (51/46/04 7998)  Complications: No apparent anesthesia complications

## 2018-03-29 NOTE — Anesthesia Postprocedure Evaluation (Signed)
Anesthesia Post Note  Patient: Diane Watkins  Procedure(s) Performed: CATARACT EXTRACTION PHACO AND INTRAOCULAR LENS PLACEMENT ([place]) (Left Eye)  Patient location during evaluation: Short Stay Anesthesia Type: MAC Level of consciousness: awake and alert and oriented Pain management: pain level controlled Vital Signs Assessment: post-procedure vital signs reviewed and stable Respiratory status: spontaneous breathing Cardiovascular status: blood pressure returned to baseline and stable Postop Assessment: no apparent nausea or vomiting Anesthetic complications: no     Last Vitals:  Vitals:   03/29/18 0640  BP: 111/78  Pulse: 72  Resp: 16  Temp: 37 C  SpO2: 93%    Last Pain:  Vitals:   03/29/18 0640  TempSrc: Oral  PainSc: 0-No pain                 Durene Dodge

## 2018-03-29 NOTE — Op Note (Signed)
Date of Admission: 03/29/2018  Date of Surgery: 03/29/2018  Pre-Op Dx: Cataract Left  Eye  Post-Op Dx: Senile Nuclear Cataract  Left  Eye,  Dx Code H25.12  Surgeon: [name], M.D.  Assistants: None  Anesthesia: Topical with MAC  Indications: Painless, progressive loss of vision with compromise of daily activities.  Surgery: Cataract Extraction with Intraocular lens Implant Left Eye  Discription: The patient had dilating drops and viscous lidocaine placed into the Left eye in the pre-op holding area. After transfer to the operating room, a time out was performed. The patient was then prepped and draped. Beginning with a 26m blade a paracentesis port was made at the surgeon's 2 o'clock position. The anterior chamber was then filled with 1% non-preserved lidocaine. This was followed by filling the anterior chamber with Provisc.  A 2.465mkeratome blade was used to make a clear corneal incision at the temporal limbus.  A bent cystatome needle was used to create a continuous tear capsulotomy. Hydrodissection was performed with balanced salt solution on a Fine canula. The lens nucleus was then removed using the phacoemulsification handpiece. Residual cortex was removed with the I&A handpiece. The anterior chamber and capsular bag were refilled with Provisc. A posterior chamber intraocular lens was placed into the capsular bag with it's injector. The implant was positioned with the Kuglan hook. The Provisc was then removed from the anterior chamber and capsular bag with the I&A handpiece. Stromal hydration of the main incision and paracentesis port was performed with BSS on a Fine canula. The wounds were tested for leak which was negative. The patient tolerated the procedure well. There were no operative complications. The patient was then transferred to the recovery room in stable condition.  Complications: None  Specimen: None  EBL: None  Prosthetic device: J&J Technis, PCB00, power 23.5, SN  [id number]

## 2018-03-30 ENCOUNTER — Encounter (HOSPITAL_COMMUNITY): Payer: Self-pay | Admitting: Ophthalmology

## 2018-07-07 ENCOUNTER — Other Ambulatory Visit: Payer: Self-pay | Admitting: *Deleted

## 2018-07-07 ENCOUNTER — Other Ambulatory Visit: Payer: Self-pay | Admitting: Family Medicine

## 2018-07-07 MED ORDER — OMEGA-3-ACID ETHYL ESTERS 1 G PO CAPS: 1.0000 | ORAL_CAPSULE | Freq: Two times a day (BID) | ORAL | 0 refills | Status: DC

## 2018-10-03 ENCOUNTER — Other Ambulatory Visit: Payer: Self-pay | Admitting: Family Medicine

## 2018-10-04 NOTE — Telephone Encounter (Signed)
Left message to return call 

## 2018-10-04 NOTE — Telephone Encounter (Signed)
Need to sched 6 mo virt vist

## 2018-10-08 NOTE — Telephone Encounter (Signed)
Patient scheduled virtual visit with Dr Richardson Landry next week.

## 2018-10-12 ENCOUNTER — Other Ambulatory Visit: Payer: Self-pay | Admitting: Family Medicine

## 2018-10-12 ENCOUNTER — Other Ambulatory Visit: Payer: Self-pay

## 2018-10-12 ENCOUNTER — Encounter: Payer: Self-pay | Admitting: Family Medicine

## 2018-10-12 ENCOUNTER — Ambulatory Visit (INDEPENDENT_AMBULATORY_CARE_PROVIDER_SITE_OTHER): Payer: PPO | Admitting: Family Medicine

## 2018-10-12 DIAGNOSIS — I10 Essential (primary) hypertension: Secondary | ICD-10-CM

## 2018-10-12 DIAGNOSIS — E781 Pure hyperglyceridemia: Secondary | ICD-10-CM | POA: Diagnosis not present

## 2018-10-12 MED ORDER — LISINOPRIL 20 MG PO TABS: 20.0000 mg | ORAL_TABLET | Freq: Every day | ORAL | 1 refills | Status: DC

## 2018-10-12 MED ORDER — OMEGA-3-ACID ETHYL ESTERS 1 G PO CAPS: 1.0000 | ORAL_CAPSULE | Freq: Two times a day (BID) | ORAL | 1 refills | Status: DC

## 2018-10-12 NOTE — Progress Notes (Signed)
   Subjective:    Patient ID: Diane Watkins, female    DOB: 09/06/1950, 68 y.o.   MRN: [id number] Format -phone  Patient present at home Provider present at office Consent for interaction obtained Coronavirus outbreak made virtual visit necessary  Hyperlipidemia  This is a chronic problem. The current episode started more than 1 year ago. Compliance problems: stopped taking crestor about two months ago due to joint pain.   not doing any exercise due to gym being closed. She is active outside.   Virtual Visit via Video Note  I connected with Diane Watkins on 10/12/18 at  2:00 PM EDT by a video enabled telemedicine application and verified that I am speaking with the correct person using two identifiers.   I discussed the limitations of evaluation and management by telemedicine and the availability of in person appointments. The patient expressed understanding and agreed to proceed.  History of Present Illness:    Observations/Objective:   Assessment and Plan:   Follow Up Instructions:    I discussed the assessment and treatment plan with the patient. The patient was provided an opportunity to ask questions and all were answered. The patient agreed with the plan and demonstrated an understanding of the instructions.   The patient was advised to call back or seek an in-person evaluation if the symptoms worsen or if the condition fails to improve as anticipated.  I provided 25 minutes of non-face-to-face time during this encounter.    Blood pressure medicine and blood pressure levels reviewed today with patient. Compliant with blood pressure medicine. States does not miss a dose. No obvious side effects. Blood pressure generally good when checked elsewhere. Watching salt intake.   Patient continues to take lipid medication regularly. Fish oil only No obvious side effects from it. Generally does not miss Prior blood work results are reviewed with patient. Patient continues  to work on fat intake in diet       Review of Systems No headache, no major weight loss or weight gain, no chest pain no back pain abdominal pain no change in bowel habits complete ROS otherwise negative     Objective:   Physical Exam   Virtual visit     Assessment & Plan:  Impression hypertension.  Good control.  Discussed.  Compliance discussed maintain same meds  2.  Hyperlipidemia.  Watching diet.  Maintaining meds.  Handling well.  We will hold off with blood work this time rationale discussed  3.  Coronavirus concerns discussed questions answered  Greater than 50% of this 25 minute face to face visit was spent in counseling and discussion and coordination of care regarding the above diagnosis/diagnosies

## 2018-10-12 NOTE — Progress Notes (Signed)
   Subjective:    Patient ID: Diane Watkins, female    DOB: 17-Aug-1950, 68 y.o.   MRN: [id number]  Hyperlipidemia       Review of Systems     Objective:   Physical Exam        Assessment & Plan:

## 2018-10-13 ENCOUNTER — Encounter: Payer: Self-pay | Admitting: Family Medicine

## 2018-10-13 NOTE — Telephone Encounter (Signed)
Done yest at visit

## 2019-01-13 ENCOUNTER — Other Ambulatory Visit: Payer: Self-pay

## 2019-03-08 ENCOUNTER — Other Ambulatory Visit: Payer: Self-pay

## 2019-03-08 DIAGNOSIS — Z20822 Contact with and (suspected) exposure to covid-19: Secondary | ICD-10-CM

## 2019-03-08 DIAGNOSIS — R6889 Other general symptoms and signs: Secondary | ICD-10-CM | POA: Diagnosis not present

## 2019-03-10 ENCOUNTER — Telehealth: Payer: Self-pay | Admitting: Family Medicine

## 2019-03-10 ENCOUNTER — Ambulatory Visit (INDEPENDENT_AMBULATORY_CARE_PROVIDER_SITE_OTHER): Payer: PPO | Admitting: Family Medicine

## 2019-03-10 DIAGNOSIS — U071 COVID-19: Secondary | ICD-10-CM

## 2019-03-10 LAB — NOVEL CORONAVIRUS, NAA: SARS-CoV-2, NAA: DETECTED — AB

## 2019-03-10 MED ORDER — BENZONATATE 100 MG PO CAPS: ORAL_CAPSULE | ORAL | 0 refills | Status: DC

## 2019-03-10 NOTE — Telephone Encounter (Signed)
The cough is directly due to COVID Be certain to make sure she is not short of breath I would recommend Tessalon 100 mg, 1 taken 3 times daily, #15, this can take the edge off the cough, I also recommend a virtual visit toward the end of today either video visit or phone visit If patient having any red flags such as severe shortness of breath she may have to go to the ER

## 2019-03-10 NOTE — Telephone Encounter (Signed)
Pt contacted and verbalized understanding. Tessalon sent to [facility] [address] for patient. Pt placed on schedule for this afternoon and was informed that Dr.Scott would get in touch with her between 4-5pm. Pt verbalized understanding.

## 2019-03-10 NOTE — Progress Notes (Signed)
   Subjective:    Patient ID: Diane Watkins, female    DOB: 06-May-1951, 68 y.o.   MRN: [id number]  HPI Pt tested positive for COVID. Pt received her results this morning. Pt contacted office to due wanting something to take for cough. Pt did have headache and diarrhea. Pt is having some shortness of breath and cough. Pt has only been eating soup. Pt states that husband was tested today for COVID due to he having symptoms. Patient relates that she is having some coughing with some shortness of breath when she moves around but no shortness of breath with sitting still she gets out of energy quickly she relates some sweats and chills she denies high fever she states the headache that she initially had is getting much better PMH benign she does have some underlying risk factors Virtual Visit via Video Note  I connected with Diane Watkins on 03/10/19 at  4:10 PM EDT by a video enabled telemedicine application and verified that I am speaking with the correct person using two identifiers.  Location: Patient: home Provider: office   I discussed the limitations of evaluation and management by telemedicine and the availability of in person appointments. The patient expressed understanding and agreed to proceed.  History of Present Illness:    Observations/Objective:   Assessment and Plan:   Follow Up Instructions:    I discussed the assessment and treatment plan with the patient. The patient was provided an opportunity to ask questions and all were answered. The patient agreed with the plan and demonstrated an understanding of the instructions.   The patient was advised to call back or seek an in-person evaluation if the symptoms worsen or if the condition fails to improve as anticipated.  I provided 17 minutes of non-face-to-face time during this encounter.   Vicente Males, LPN    Review of Systems  Constitutional: Positive for chills and fatigue. Negative for activity change and  fever.  HENT: Positive for congestion and rhinorrhea. Negative for ear pain.   Eyes: Negative for discharge.  Respiratory: Positive for cough. Negative for shortness of breath and wheezing.   Cardiovascular: Negative for chest pain.       Objective:   Physical Exam  Patient had virtual visit Appears to be in no distress Atraumatic Neuro able to relate and oriented No apparent resp distress Color normal Patient does not appear to be out of breath on exam but she does appear that she is just not feeling well      Assessment & Plan:  COVID infection Patient does not have access to my chart I talked with her about warning signs what to watch for I will call her again tomorrow to check in with her and see how she is doing If she starts having progressive shortness of breath or feel like she is going to pass out for other major issues immediately go to the ER or call 911

## 2019-03-10 NOTE — Telephone Encounter (Signed)
Pt received positive results this morning. Please advise. Thank you

## 2019-03-10 NOTE — Telephone Encounter (Signed)
Pt tested positive for Covid, was told to take Tylenol & Delsym  Pt states this is not helping her cough, wonders of we can prescribe something else  Please advise & call pt    [facility]

## 2019-03-11 ENCOUNTER — Ambulatory Visit: Payer: PPO | Admitting: Family Medicine

## 2019-03-12 ENCOUNTER — Inpatient Hospital Stay (HOSPITAL_COMMUNITY)
Admission: EM | Admit: 2019-03-12 | Discharge: 2019-03-17 | DRG: 177 | Disposition: A | Discharge status: Home / Self Care | Payer: PPO | Attending: Internal Medicine | Admitting: Internal Medicine

## 2019-03-12 ENCOUNTER — Encounter (HOSPITAL_COMMUNITY): Payer: Self-pay | Admitting: Emergency Medicine

## 2019-03-12 ENCOUNTER — Other Ambulatory Visit: Payer: Self-pay

## 2019-03-12 ENCOUNTER — Emergency Department (HOSPITAL_COMMUNITY): Payer: PPO

## 2019-03-12 DIAGNOSIS — Z79899 Other long term (current) drug therapy: Secondary | ICD-10-CM

## 2019-03-12 DIAGNOSIS — Z811 Family history of alcohol abuse and dependence: Secondary | ICD-10-CM | POA: Diagnosis not present

## 2019-03-12 DIAGNOSIS — R51 Headache: Secondary | ICD-10-CM | POA: Diagnosis not present

## 2019-03-12 DIAGNOSIS — J1289 Other viral pneumonia: Secondary | ICD-10-CM | POA: Diagnosis present

## 2019-03-12 DIAGNOSIS — I10 Essential (primary) hypertension: Secondary | ICD-10-CM | POA: Diagnosis present

## 2019-03-12 DIAGNOSIS — E7800 Pure hypercholesterolemia, unspecified: Secondary | ICD-10-CM | POA: Diagnosis present

## 2019-03-12 DIAGNOSIS — Z791 Long term (current) use of non-steroidal anti-inflammatories (NSAID): Secondary | ICD-10-CM | POA: Diagnosis not present

## 2019-03-12 DIAGNOSIS — Z6835 Body mass index (BMI) 35.0-35.9, adult: Secondary | ICD-10-CM

## 2019-03-12 DIAGNOSIS — M1990 Unspecified osteoarthritis, unspecified site: Secondary | ICD-10-CM | POA: Diagnosis present

## 2019-03-12 DIAGNOSIS — E669 Obesity, unspecified: Secondary | ICD-10-CM | POA: Diagnosis present

## 2019-03-12 DIAGNOSIS — R0602 Shortness of breath: Secondary | ICD-10-CM | POA: Diagnosis not present

## 2019-03-12 DIAGNOSIS — R509 Fever, unspecified: Secondary | ICD-10-CM | POA: Diagnosis present

## 2019-03-12 DIAGNOSIS — R519 Headache, unspecified: Secondary | ICD-10-CM | POA: Diagnosis present

## 2019-03-12 DIAGNOSIS — Z8249 Family history of ischemic heart disease and other diseases of the circulatory system: Secondary | ICD-10-CM

## 2019-03-12 DIAGNOSIS — U071 COVID-19: Secondary | ICD-10-CM | POA: Diagnosis present

## 2019-03-12 DIAGNOSIS — Z7982 Long term (current) use of aspirin: Secondary | ICD-10-CM

## 2019-03-12 HISTORY — DX: Hyperlipidemia, unspecified: E78.5

## 2019-03-12 LAB — LACTATE DEHYDROGENASE: LDH: 263 U/L — ABNORMAL HIGH (ref 98–192)

## 2019-03-12 LAB — TROPONIN I (HIGH SENSITIVITY): Troponin I (High Sensitivity): 5 ng/L (ref <18)

## 2019-03-12 LAB — COMPREHENSIVE METABOLIC PANEL
ALT: 27 U/L (ref 0–44)
AST: 37 U/L (ref 15–41)
Albumin: 4.3 g/dL (ref 3.5–5.0)
Alkaline Phosphatase: 49 U/L (ref 38–126)
Anion gap: 12 (ref 5–15)
BUN: 10 mg/dL (ref 8–23)
CO2: 25 mmol/L (ref 22–32)
Calcium: 9.1 mg/dL (ref 8.9–10.3)
Chloride: 100 mmol/L (ref 98–111)
Creatinine, Ser: 0.85 mg/dL (ref 0.44–1.00)
GFR calc Af Amer: >60 mL/min (ref >60)
GFR calc non Af Amer: >60 mL/min (ref >60)
Glucose, Bld: 121 mg/dL — ABNORMAL HIGH (ref 70–99)
Potassium: 3.9 mmol/L (ref 3.5–5.1)
Sodium: 137 mmol/L (ref 135–145)
Total Bilirubin: 0.6 mg/dL (ref 0.3–1.2)
Total Protein: 7.5 g/dL (ref 6.5–8.1)

## 2019-03-12 LAB — D-DIMER, QUANTITATIVE: D-Dimer, Quant: 0.51 ug/mL-FEU — ABNORMAL HIGH (ref 0.00–0.50)

## 2019-03-12 LAB — CBC
HCT: 44.5 % (ref 36.0–46.0)
Hemoglobin: 15.3 g/dL — ABNORMAL HIGH (ref 12.0–15.0)
MCH: 31.4 pg (ref 26.0–34.0)
MCHC: 34.4 g/dL (ref 30.0–36.0)
MCV: 91.4 fL (ref 80.0–100.0)
Platelets: 203 10*3/uL (ref 150–400)
RBC: 4.87 MIL/uL (ref 3.87–5.11)
RDW: 13.3 % (ref 11.5–15.5)
WBC: 4.5 10*3/uL (ref 4.0–10.5)
nRBC: 0 % (ref 0.0–0.2)

## 2019-03-12 LAB — PROCALCITONIN: Procalcitonin: <0.1 ng/mL

## 2019-03-12 MED ORDER — ENOXAPARIN SODIUM 40 MG/0.4ML ~~LOC~~ SOLN
40.0000 mg | SUBCUTANEOUS | Status: DC
  Administered 2019-03-12 – 2019-03-16 (×5): 40 mg via SUBCUTANEOUS
  Filled 2019-03-12 (×5): qty 0.4

## 2019-03-12 MED ORDER — SODIUM CHLORIDE 0.9 % IV SOLN
1.0000 g | INTRAVENOUS | Status: DC
  Administered 2019-03-12: 1 g via INTRAVENOUS
  Filled 2019-03-12: qty 10

## 2019-03-12 MED ORDER — SODIUM CHLORIDE 0.9 % IV SOLN
500.0000 mg | INTRAVENOUS | Status: DC
  Administered 2019-03-13: 500 mg via INTRAVENOUS
  Filled 2019-03-12: qty 500

## 2019-03-12 MED ORDER — PREDNISONE 50 MG PO TABS
60.0000 mg | ORAL_TABLET | Freq: Once | ORAL | Status: AC
  Administered 2019-03-12: 60 mg via ORAL
  Filled 2019-03-12: qty 1

## 2019-03-12 MED ORDER — HYDROCOD POLST-CPM POLST ER 10-8 MG/5ML PO SUER
5.0000 mL | Freq: Once | ORAL | Status: AC
  Administered 2019-03-12: 5 mL via ORAL
  Filled 2019-03-12: qty 5

## 2019-03-12 MED ORDER — ALBUTEROL SULFATE HFA 108 (90 BASE) MCG/ACT IN AERS
2.0000 | INHALATION_SPRAY | RESPIRATORY_TRACT | Status: DC | PRN
  Administered 2019-03-12: 2 via RESPIRATORY_TRACT
  Filled 2019-03-12: qty 6.7

## 2019-03-12 MED ORDER — BENZONATATE 100 MG PO CAPS
100.0000 mg | ORAL_CAPSULE | Freq: Three times a day (TID) | ORAL | Status: DC | PRN
  Administered 2019-03-13 – 2019-03-16 (×5): 100 mg via ORAL
  Filled 2019-03-12 (×5): qty 1

## 2019-03-12 MED ORDER — LISINOPRIL 20 MG PO TABS
20.0000 mg | ORAL_TABLET | Freq: Every day | ORAL | Status: DC
  Administered 2019-03-13 – 2019-03-16 (×4): 20 mg via ORAL
  Filled 2019-03-12 (×5): qty 1

## 2019-03-12 MED ORDER — POLYVINYL ALCOHOL 1.4 % OP SOLN
1.0000 [drp] | Freq: Every day | OPHTHALMIC | Status: DC | PRN
  Filled 2019-03-12: qty 15

## 2019-03-12 MED ORDER — DEXAMETHASONE SODIUM PHOSPHATE 10 MG/ML IJ SOLN
6.0000 mg | INTRAMUSCULAR | Status: DC
  Administered 2019-03-12: 6 mg via INTRAVENOUS
  Filled 2019-03-12: qty 1

## 2019-03-12 MED ORDER — DIPHENHYDRAMINE HCL 25 MG PO CAPS
25.0000 mg | ORAL_CAPSULE | Freq: Every day | ORAL | Status: DC | PRN
  Administered 2019-03-13: 25 mg via ORAL
  Filled 2019-03-12 (×3): qty 1

## 2019-03-12 MED ORDER — ASPIRIN EC 81 MG PO TBEC
81.0000 mg | DELAYED_RELEASE_TABLET | Freq: Every day | ORAL | Status: DC
  Administered 2019-03-12 – 2019-03-16 (×5): 81 mg via ORAL
  Filled 2019-03-12 (×5): qty 1

## 2019-03-12 NOTE — H&P (Signed)
[facility] H&P    Patient Demographics:    Diane Watkins, is a 68 y.o. female  MRN: [id number]  DOB - [birth year]  Admit Date - 03/12/2019  Referring MD/NP/PA:  Denton Meek  Outpatient Primary MD for the patient is Luking, Grace Bushy, MD  Patient coming from:  home  Chief complaint-  Fever, cough, dyspnea   HPI:    Diane Watkins  is a 68 y.o. female,  w hypertension, covid -19 + on 03/08/2019,  apparently presents with c/o fever, cough, dyspnea.  Pt is covid-19 positive.  Pt states has had fever for the past 10 days.  Pt states has had cough, w white sputum as well. Pt noted today that she became more dyspneic.  Slight loose stool.  Pt denies alteration in sense of taste or smell.  Pt presented to ED due to dyspnea. Pt is not sure how she contracted covid-19  In ED,  T 100.2 P 95  R 22 Bp 18395 pox 97% on RA 87% on RA Wt 90.3kg  CXR IMPRESSION: There may be minimal opacity in the periphery of the left lung which is not definitive. No other abnormalities.  Wbc 4.5, Hgb 15.3, Plt 203 Na 137, K 3.9, Bun 10, Creatinine 0.85   Ast 37, Alt 27  Pt will be admitted for Covid -19 infection.    Review of systems:    In addition to the HPI above,    No Headache, No changes with Vision or hearing, No problems swallowing food or Liquids, No Chest pain,  No Abdominal pain, No Nausea or Vomiting,  No Blood in stool or Urine, No dysuria, No new skin rashes or bruises, No new joints pains-aches,  No new weakness, tingling, numbness in any extremity, No recent weight gain or loss, No polyuria, polydypsia or polyphagia, No significant Mental Stressors.  All other systems reviewed and are negative.    Past History of the following :    Past Medical History:  Diagnosis Date  . Arthritis   . High cholesterol   . Hypertension       Past Surgical History:  Procedure Laterality Date  . CATARACT  EXTRACTION W/PHACO Left 03/29/2018   Procedure: CATARACT EXTRACTION PHACO AND INTRAOCULAR LENS PLACEMENT ([facility]);  Surgeon: [name], MD;  Location: [facility];  Service: Ophthalmology;  Laterality: Left;  CDE: 10.13  . CHOLECYSTECTOMY    . TUBAL LIGATION        Social History:      Social History   Tobacco Use  . Smoking status: Never Smoker  . Smokeless tobacco: Never Used  Substance Use Topics  . Alcohol use: No       Family History :     Family History  Problem Relation Age of Onset  . Alcohol abuse Father   . Heart disease Father 47       MI       Home Medications:   Prior to Admission medications   Medication Sig Start Date End Date Taking? Authorizing Provider  acetaminophen (TYLENOL) 650 MG  CR tablet Take 650 mg by mouth daily as needed for pain.   Yes [provider]  aspirin 81 MG tablet Take 81 mg by mouth at bedtime.    Yes [provider]  benzonatate (TESSALON) 100 MG capsule Take one capsule TID Patient taking differently: Take 100 mg by mouth 3 (three) times daily as needed for cough.  03/10/19  Yes Kathyrn Drown, MD  diphenhydrAMINE (BENADRYL) 25 MG tablet Take 25 mg by mouth daily as needed for sleep.   Yes [provider]  ibuprofen (ADVIL,MOTRIN) 200 MG tablet Take 400 mg by mouth daily as needed for mild pain or moderate pain.    Yes [provider]  lisinopril (ZESTRIL) 20 MG tablet Take 1 tablet (20 mg total) by mouth daily. 10/12/18  Yes Mikey Kirschner, MD  omega-3 acid ethyl esters (LOVAZA) 1 g capsule Take 1 capsule (1 g total) by mouth 2 (two) times daily. 10/12/18  Yes Mikey Kirschner, MD  Polyethyl Glycol-Propyl Glycol (SYSTANE) 0.4-0.3 % SOLN Apply 1-2 drops to eye daily as needed (FOR DRY EYE RELIEF).   Yes [provider]     Allergies:     Allergies  Allergen Reactions  . Amoxil [Amoxicillin]     headaches Has patient had a PCN reaction causing immediate rash, facial/tongue/throat  swelling, SOB or lightheadedness with hypotension: No Has patient had a PCN reaction causing severe rash involving mucus membranes or skin necrosis: No Has patient had a PCN reaction that required hospitalization: No Has patient had a PCN reaction occurring within the last 10 years: No If all of the above answers are "NO", then may proceed with Cephalosporin use.      Physical Exam:   Vitals  Blood pressure (!) 152/76, pulse (!) 103, temperature 100.2 F (37.9 C), temperature source Oral, resp. rate (!) 25, weight 90.3 kg, SpO2 91 %.  1.  General: axoxo3  2. Psychiatric: euthymic  3. Neurologic: cn2-12 intact, reflexes 2+ symmetric, diffuse with no clonus, motor 5/5 in all 4 ext  4. HEENMT:  Anicteric, pupils 1.36m symmetric, direct, consensual, near Intact Neck: no jvd  5. Respiratory : Slight crackles left lung base, no wheezing  6. Cardiovascular : rrr s1, s2,   7. Gastrointestinal:  Abd: soft, nt, nd, +bs  8. Skin:  Ext: no c/c/e,  No rash  9.Musculoskeletal:  Good ROM    Data Review:    CBC Recent Labs  Lab 03/12/19 2028  WBC 4.5  HGB 15.3*  HCT 44.5  PLT 203  MCV 91.4  MCH 31.4  MCHC 34.4  RDW 13.3   ------------------------------------------------------------------------------------------------------------------  Results for orders placed or performed during the hospital encounter of 03/12/19 (from the past 48 hour(s))  CBC     Status: Abnormal   Collection Time: 03/12/19  8:28 PM  Result Value Ref Range   WBC 4.5 4.0 - 10.5 K/uL   RBC 4.87 3.87 - 5.11 MIL/uL   Hemoglobin 15.3 (H) 12.0 - 15.0 g/dL   HCT 44.5 36.0 - 46.0 %   MCV 91.4 80.0 - 100.0 fL   MCH 31.4 26.0 - 34.0 pg   MCHC 34.4 30.0 - 36.0 g/dL   RDW 13.3 11.5 - 15.5 %   Platelets 203 150 - 400 K/uL   nRBC 0.0 0.0 - 0.2 %    Comment: [facility] [address] [place] [address] Comprehensive metabolic panel     Status: Abnormal   Collection  Time:  03/12/19  8:28 PM  Result Value Ref Range   Sodium 137 135 - 145 mmol/L   Potassium 3.9 3.5 - 5.1 mmol/L   Chloride 100 98 - 111 mmol/L   CO2 25 22 - 32 mmol/L   Glucose, Bld 121 (H) 70 - 99 mg/dL   BUN 10 8 - 23 mg/dL   Creatinine, Ser 0.85 0.44 - 1.00 mg/dL   Calcium 9.1 8.9 - 10.3 mg/dL   Total Protein 7.5 6.5 - 8.1 g/dL   Albumin 4.3 3.5 - 5.0 g/dL   AST 37 15 - 41 U/L   ALT 27 0 - 44 U/L   Alkaline Phosphatase 49 38 - 126 U/L   Total Bilirubin 0.6 0.3 - 1.2 mg/dL   GFR calc non Af Amer >60 >60 mL/min   GFR calc Af Amer >60 >60 mL/min   Anion gap 12 5 - 15    Comment: Performed at [facility], [address] [place], [place] [address]    Chemistries  Recent Labs  Lab 03/12/19 2028  NA 137  K 3.9  CL 100  CO2 25  GLUCOSE 121*  BUN 10  CREATININE 0.85  CALCIUM 9.1  AST 37  ALT 27  ALKPHOS 49  BILITOT 0.6   ------------------------------------------------------------------------------------------------------------------  ------------------------------------------------------------------------------------------------------------------ GFR: CrCl cannot be calculated (Unknown ideal weight.). Liver Function Tests: Recent Labs  Lab 03/12/19 2028  AST 37  ALT 27  ALKPHOS 49  BILITOT 0.6  PROT 7.5  ALBUMIN 4.3   No results for input(s): LIPASE, AMYLASE in the last 168 hours. No results for input(s): AMMONIA in the last 168 hours. Coagulation Profile: No results for input(s): INR, PROTIME in the last 168 hours. Cardiac Enzymes: No results for input(s): CKTOTAL, CKMB, CKMBINDEX, TROPONINI in the last 168 hours. BNP (last 3 results) No results for input(s): PROBNP in the last 8760 hours. HbA1C: No results for input(s): HGBA1C in the last 72 hours. CBG: No results for input(s): GLUCAP in the last 168 hours. Lipid Profile: No results for input(s): CHOL, HDL, LDLCALC, TRIG, CHOLHDL, LDLDIRECT in the last 72 hours. Thyroid Function Tests: No results for  input(s): TSH, T4TOTAL, FREET4, T3FREE, THYROIDAB in the last 72 hours. Anemia Panel: No results for input(s): VITAMINB12, FOLATE, FERRITIN, TIBC, IRON, RETICCTPCT in the last 72 hours.  --------------------------------------------------------------------------------------------------------------- Urine analysis: No results found for: COLORURINE, APPEARANCEUR, LABSPEC, PHURINE, GLUCOSEU, HGBUR, BILIRUBINUR, KETONESUR, PROTEINUR, UROBILINOGEN, NITRITE, LEUKOCYTESUR    Imaging Results:    Dg Chest Portable 1 View  Result Date: 03/12/2019 CLINICAL DATA:  Shortness of breath.  Recent COVID-19 diagnosis. EXAM: PORTABLE CHEST 1 VIEW COMPARISON:  None. FINDINGS: The heart, hila, and mediastinum are normal. Right lung is clear. There may be minimal opacity in the periphery of the left lung which is not definitive. No other abnormalities. IMPRESSION: There may be minimal opacity in the periphery of the left lung which is not definitive. No other abnormalities. Electronically Signed   By: Dorise Bullion III M.D   On: 03/12/2019 20:53   nsr at 95, Lad, RBBB, LAFB   Assessment & Plan:    Principal Problem:   COVID-19 virus infection Active Problems:   Essential hypertension, benign   Fever  Covid -19 infection Check crp, esr, Ldh, D dimer, Ferritin, IL-6 Dexamethasone 76m iv qday Remdesivir pharmacy to dose   ? Infiltrate / pneumonia Blood culture x2 Urine strep antigen Urine legionella antigen Rocephin 1gm iv qday Zithromax 5025miv qday  Hypertension Cont Lisinopril 2078mo  qday   DVT Prophylaxis-   Lovenox - SCDs   AM Labs Ordered, also please review Full Orders  Family Communication: Admission, patients condition and plan of care including tests being ordered have been discussed with the patient  who indicate understanding and agree with the plan and Code Status.  Code Status:  FULL CODE per patient, husband present in ED  Admission status: Observation: Based on patients  clinical presentation and evaluation of above clinical data, I have made determination that patient meets observation criteria at this time.  Time spent in minutes : 55   Jani Gravel M.D on 03/12/2019 at 10:58 PM

## 2019-03-12 NOTE — ED Notes (Signed)
Walked with patient around room. Her O2 level went from a 93 to 88 .

## 2019-03-12 NOTE — ED Triage Notes (Signed)
Pt C/O SOB that began today. Pt states she was Dx with COVID19 on Thursday. PT reports loss of appetite and cough. Pt unsure of fever due to taking tylenol regularly.

## 2019-03-12 NOTE — ED Provider Notes (Signed)
[facility] EMERGENCY DEPARTMENT Provider Note   CSN: [id number] Arrival date & time: 03/12/19  1950     History   Chief Complaint Chief Complaint  Patient presents with   Shortness of Breath    HPI Diane Watkins is a 68 y.o. female.     Patient complains of shortness of breath.  She was diagnosed with COVID 2 days ago  The history is provided by the patient. No language interpreter was used.  Shortness of Breath Severity:  Moderate Onset quality:  Sudden Timing:  Constant Progression:  Worsening Chronicity:  New Context: activity   Relieved by:  Nothing Worsened by:  Coughing Ineffective treatments:  None tried Associated symptoms: no abdominal pain, no chest pain, no cough, no headaches and no rash     Past Medical History:  Diagnosis Date   Arthritis    High cholesterol    Hypertension     Patient Active Problem List   Diagnosis Date Noted   Arthritis of both knees 05/01/2015   Osteopenia 05/17/2014   Essential hypertension, benign 05/04/2013   Hypertriglyceridemia 05/04/2013    Past Surgical History:  Procedure Laterality Date   CATARACT EXTRACTION W/PHACO Left 03/29/2018   Procedure: CATARACT EXTRACTION PHACO AND INTRAOCULAR LENS PLACEMENT ([place]);  Surgeon: [name], MD;  Location: [facility];  Service: Ophthalmology;  Laterality: Left;  CDE: 10.13   CHOLECYSTECTOMY     TUBAL LIGATION       OB History    Gravida      Para      Term      Preterm      AB      Living  2     SAB      TAB      Ectopic      Multiple      Live Births               Home Medications    Prior to Admission medications   Medication Sig Start Date End Date Taking? Authorizing Provider  acetaminophen (TYLENOL) 650 MG CR tablet Take 650 mg by mouth daily as needed for pain.   Yes [provider]  aspirin 81 MG tablet Take 81 mg by mouth at bedtime.    Yes [provider]  benzonatate (TESSALON) 100 MG capsule Take one  capsule TID Patient taking differently: Take 100 mg by mouth 3 (three) times daily as needed for cough.  03/10/19  Yes Kathyrn Drown, MD  diphenhydrAMINE (BENADRYL) 25 MG tablet Take 25 mg by mouth daily as needed for sleep.   Yes [provider]  ibuprofen (ADVIL,MOTRIN) 200 MG tablet Take 400 mg by mouth daily as needed for mild pain or moderate pain.    Yes [provider]  lisinopril (ZESTRIL) 20 MG tablet Take 1 tablet (20 mg total) by mouth daily. 10/12/18  Yes Mikey Kirschner, MD  omega-3 acid ethyl esters (LOVAZA) 1 g capsule Take 1 capsule (1 g total) by mouth 2 (two) times daily. 10/12/18  Yes Mikey Kirschner, MD  Polyethyl Glycol-Propyl Glycol (SYSTANE) 0.4-0.3 % SOLN Apply 1-2 drops to eye daily as needed (FOR DRY EYE RELIEF).   Yes [provider]    Family History Family History  Problem Relation Age of Onset   Alcohol abuse Father    Heart disease Father 6       MI    Social History Social History   Tobacco Use   Smoking  status: Never Smoker   Smokeless tobacco: Never Used  Substance Use Topics   Alcohol use: No   Drug use: No     Allergies   Amoxil [amoxicillin]   Review of Systems Review of Systems  Constitutional: Negative for appetite change and fatigue.  HENT: Negative for congestion, ear discharge and sinus pressure.   Eyes: Negative for discharge.  Respiratory: Positive for shortness of breath. Negative for cough.   Cardiovascular: Negative for chest pain.  Gastrointestinal: Negative for abdominal pain and diarrhea.  Genitourinary: Negative for frequency and hematuria.  Musculoskeletal: Negative for back pain.  Skin: Negative for rash.  Neurological: Negative for seizures and headaches.  Psychiatric/Behavioral: Negative for hallucinations.     Physical Exam Updated Vital Signs BP (!) 183/95 (BP Location: Right Arm)    Pulse 95    Temp 100.2 F (37.9 C) (Oral)    Resp (!) 22    Wt 90.3 kg    SpO2 97%    BMI  35.25 kg/m   Physical Exam Vitals signs and nursing note reviewed.  Constitutional:      Appearance: She is well-developed.  HENT:     Head: Normocephalic.     Mouth/Throat:     Mouth: Mucous membranes are moist.  Eyes:     General: No scleral icterus.    Conjunctiva/sclera: Conjunctivae normal.  Neck:     Musculoskeletal: Neck supple.     Thyroid: No thyromegaly.  Cardiovascular:     Rate and Rhythm: Normal rate and regular rhythm.     Heart sounds: No murmur. No friction rub. No gallop.   Pulmonary:     Breath sounds: No stridor. No wheezing or rales.  Chest:     Chest wall: No tenderness.  Abdominal:     General: There is no distension.     Tenderness: There is no abdominal tenderness. There is no rebound.  Musculoskeletal: Normal range of motion.  Lymphadenopathy:     Cervical: No cervical adenopathy.  Skin:    Findings: No erythema or rash.  Neurological:     Mental Status: She is oriented to person, place, and time.     Motor: No abnormal muscle tone.     Coordination: Coordination normal.  Psychiatric:        Behavior: Behavior normal.      ED Treatments / Results  Labs (all labs ordered are listed, but only abnormal results are displayed) Labs Reviewed  CBC - Abnormal; Notable for the following components:      Result Value   Hemoglobin 15.3 (*)    All other components within normal limits  COMPREHENSIVE METABOLIC PANEL - Abnormal; Notable for the following components:   Glucose, Bld 121 (*)    All other components within normal limits    EKG None  Radiology Dg Chest Portable 1 View  Result Date: 03/12/2019 CLINICAL DATA:  Shortness of breath.  Recent COVID-19 diagnosis. EXAM: PORTABLE CHEST 1 VIEW COMPARISON:  None. FINDINGS: The heart, hila, and mediastinum are normal. Right lung is clear. There may be minimal opacity in the periphery of the left lung which is not definitive. No other abnormalities. IMPRESSION: There may be minimal opacity in the  periphery of the left lung which is not definitive. No other abnormalities. Electronically Signed   By: Dorise Bullion III M.D   On: 03/12/2019 20:53    Procedures Procedures (including critical care time)  Medications Ordered in ED Medications  albuterol (VENTOLIN HFA) 108 (90 Base) MCG/ACT inhaler  2 puff (2 puffs Inhalation Given 03/12/19 2052)  predniSONE (DELTASONE) tablet 60 mg (60 mg Oral Given 03/12/19 2050)  chlorpheniramine-HYDROcodone (TUSSIONEX) 10-8 MG/5ML suspension 5 mL (5 mLs Oral Given 03/12/19 2051)     Initial Impression / Assessment and Plan / ED Course  I have reviewed the triage vital signs and the nursing notes.  Pertinent labs & imaging results that were available during my care of the patient were reviewed by me and considered in my medical decision making (see chart for details).  TRAMEKA KOOI was evaluated in Emergency Department on 03/12/2019 for the symptoms described in the history of present illness. She was evaluated in the context of the global COVID-19 pandemic, which necessitated consideration that the patient might be at risk for infection with the SARS-CoV-2 virus that causes COVID-19. Institutional protocols and algorithms that pertain to the evaluation of patients at risk for COVID-19 are in a state of rapid change based on information released by regulatory bodies including the CDC and federal and state organizations. These policies and algorithms were followed during the patient's care in the ED. Marland Kitchen Patient was covered infection.  Patient had her oxygen dropped to 88% when she ambulated without oxygen.  She will be admitted to medicine        Final Clinical Impressions(s) / ED Diagnoses   Final diagnoses:  None    ED Discharge Orders    None       Milton Ferguson, MD 03/12/19 2246

## 2019-03-13 ENCOUNTER — Observation Stay (HOSPITAL_COMMUNITY): Payer: PPO

## 2019-03-13 ENCOUNTER — Encounter (HOSPITAL_COMMUNITY): Payer: Self-pay | Admitting: Internal Medicine

## 2019-03-13 DIAGNOSIS — Z6835 Body mass index (BMI) 35.0-35.9, adult: Secondary | ICD-10-CM | POA: Diagnosis not present

## 2019-03-13 DIAGNOSIS — M1990 Unspecified osteoarthritis, unspecified site: Secondary | ICD-10-CM | POA: Diagnosis not present

## 2019-03-13 DIAGNOSIS — U071 COVID-19: Secondary | ICD-10-CM | POA: Diagnosis present

## 2019-03-13 DIAGNOSIS — J1289 Other viral pneumonia: Secondary | ICD-10-CM | POA: Diagnosis not present

## 2019-03-13 DIAGNOSIS — R51 Headache: Secondary | ICD-10-CM | POA: Diagnosis not present

## 2019-03-13 DIAGNOSIS — Z79899 Other long term (current) drug therapy: Secondary | ICD-10-CM | POA: Diagnosis not present

## 2019-03-13 DIAGNOSIS — Z8249 Family history of ischemic heart disease and other diseases of the circulatory system: Secondary | ICD-10-CM | POA: Diagnosis not present

## 2019-03-13 DIAGNOSIS — Z7982 Long term (current) use of aspirin: Secondary | ICD-10-CM | POA: Diagnosis not present

## 2019-03-13 DIAGNOSIS — I10 Essential (primary) hypertension: Secondary | ICD-10-CM | POA: Diagnosis not present

## 2019-03-13 DIAGNOSIS — E7800 Pure hypercholesterolemia, unspecified: Secondary | ICD-10-CM | POA: Diagnosis not present

## 2019-03-13 DIAGNOSIS — Z791 Long term (current) use of non-steroidal anti-inflammatories (NSAID): Secondary | ICD-10-CM | POA: Diagnosis not present

## 2019-03-13 DIAGNOSIS — R0602 Shortness of breath: Secondary | ICD-10-CM | POA: Diagnosis not present

## 2019-03-13 DIAGNOSIS — R519 Headache, unspecified: Secondary | ICD-10-CM | POA: Diagnosis present

## 2019-03-13 DIAGNOSIS — Z811 Family history of alcohol abuse and dependence: Secondary | ICD-10-CM | POA: Diagnosis not present

## 2019-03-13 DIAGNOSIS — E669 Obesity, unspecified: Secondary | ICD-10-CM | POA: Diagnosis not present

## 2019-03-13 LAB — ABO/RH: ABO/RH(D): A POS

## 2019-03-13 LAB — CK TOTAL AND CKMB (NOT AT ARMC)
CK, MB: 2.2 ng/mL (ref 0.5–5.0)
Relative Index: 0.4 (ref 0.0–2.5)
Total CK: 596 U/L — ABNORMAL HIGH (ref 38–234)

## 2019-03-13 LAB — C-REACTIVE PROTEIN: CRP: 1.9 mg/dL — ABNORMAL HIGH (ref <1.0)

## 2019-03-13 LAB — TROPONIN I (HIGH SENSITIVITY): Troponin I (High Sensitivity): 6 ng/L (ref <18)

## 2019-03-13 LAB — BRAIN NATRIURETIC PEPTIDE: B Natriuretic Peptide: 10 pg/mL (ref 0.0–100.0)

## 2019-03-13 LAB — FERRITIN: Ferritin: 205 ng/mL (ref 11–307)

## 2019-03-13 MED ORDER — SODIUM CHLORIDE 0.9 % IV SOLN
100.0000 mg | INTRAVENOUS | Status: AC
  Administered 2019-03-14 – 2019-03-17 (×4): 100 mg via INTRAVENOUS
  Filled 2019-03-13 (×4): qty 20

## 2019-03-13 MED ORDER — GUAIFENESIN 100 MG/5ML PO SOLN
5.0000 mL | ORAL | Status: DC | PRN
  Administered 2019-03-13 – 2019-03-14 (×3): 100 mg via ORAL
  Administered 2019-03-15: 10:00:00 5 mL via ORAL
  Administered 2019-03-15 – 2019-03-16 (×3): 100 mg via ORAL
  Filled 2019-03-13 (×7): qty 15

## 2019-03-13 MED ORDER — LABETALOL HCL 5 MG/ML IV SOLN: 10.0000 mg | INTRAVENOUS | Status: DC | PRN

## 2019-03-13 MED ORDER — SODIUM CHLORIDE 0.9 % IV SOLN
200.0000 mg | Freq: Once | INTRAVENOUS | Status: AC
  Administered 2019-03-13: 06:00:00 200 mg via INTRAVENOUS
  Filled 2019-03-13: qty 40

## 2019-03-13 MED ORDER — GUAIFENESIN ER 600 MG PO TB12
1200.0000 mg | ORAL_TABLET | Freq: Two times a day (BID) | ORAL | Status: DC
  Administered 2019-03-13 – 2019-03-17 (×7): 1200 mg via ORAL
  Filled 2019-03-13 (×10): qty 2

## 2019-03-13 MED ORDER — ENSURE ENLIVE PO LIQD
237.0000 mL | Freq: Two times a day (BID) | ORAL | Status: DC
  Administered 2019-03-13 – 2019-03-17 (×3): 237 mL via ORAL

## 2019-03-13 MED ORDER — ACETAMINOPHEN 325 MG PO TABS
650.0000 mg | ORAL_TABLET | Freq: Four times a day (QID) | ORAL | Status: DC | PRN
  Administered 2019-03-13 – 2019-03-15 (×3): 650 mg via ORAL
  Filled 2019-03-13 (×3): qty 2

## 2019-03-13 MED ORDER — BUTALBITAL-APAP-CAFFEINE 50-325-40 MG PO TABS: 1.0000 | ORAL_TABLET | ORAL | Status: DC | PRN

## 2019-03-13 MED ORDER — ASPIRIN-ACETAMINOPHEN-CAFFEINE 250-250-65 MG PO TABS
2.0000 | ORAL_TABLET | Freq: Four times a day (QID) | ORAL | Status: DC | PRN
  Administered 2019-03-13 – 2019-03-14 (×2): 2 via ORAL
  Filled 2019-03-13 (×4): qty 2

## 2019-03-13 MED ORDER — IOHEXOL 350 MG/ML SOLN
100.0000 mL | Freq: Once | INTRAVENOUS | Status: AC | PRN
  Administered 2019-03-13: 100 mL via INTRAVENOUS

## 2019-03-13 MED ORDER — ADULT MULTIVITAMIN W/MINERALS CH
1.0000 | ORAL_TABLET | Freq: Every day | ORAL | Status: DC
  Administered 2019-03-13 – 2019-03-17 (×5): 1 via ORAL
  Filled 2019-03-13 (×5): qty 1

## 2019-03-13 NOTE — Progress Notes (Signed)
Initial Nutrition Assessment  DOCUMENTATION CODES:   Obesity unspecified  INTERVENTION:   -Ensure Enlive po BID, each supplement provides 350 kcal and 20 grams of protein -Multivitamin with minerals daily  -Patient receiving Vital Cuisine BID and Magic Cups BID with meals.  NUTRITION DIAGNOSIS:   Increased nutrient needs related to acute illness as evidenced by estimated needs.  GOAL:   Patient will meet greater than or equal to 90% of their needs  MONITOR:   PO intake, Supplement acceptance, Labs, Weight trends, I & O's  REASON FOR ASSESSMENT:   Malnutrition Screening Tool    ASSESSMENT:   68 y.o. female,  w hypertension, covid -19 + on 03/08/2019,  apparently presents with c/o fever, cough, dyspnea.  Pt is covid-19 positive.  **RD working remotely**  Patient tested positive for COVID-19 on 9/24. Pt reports poor appetite and diarrhea PTA. Pt is already receiving Vital Cuisine and Magic Cups with meals. Will also add Ensure supplements and daily MVI given increased needs from COVID-19 infection.  Per weight records, pt's weight has remained stable. Weight tends to fluctuate between 193-198 lbs over the past year.   Labs reviewed. Medications reviewed.  NUTRITION - FOCUSED PHYSICAL EXAM:  Unable to perform -working remotely.  Diet Order:   Diet Order            Diet Heart Room service appropriate? Yes; Fluid consistency: Thin  Diet effective now              EDUCATION NEEDS:   No education needs have been identified at this time  Skin:  Skin Assessment: Reviewed RN Assessment  Last BM:  9/26  Height:   Ht Readings from Last 1 Encounters:  03/13/19 5\' 3"  (1.6 m)    Weight:   Wt Readings from Last 1 Encounters:  03/13/19 87.5 kg    Ideal Body Weight:  52.3 kg  BMI:  Body mass index is 34.19 kg/m.  Estimated Nutritional Needs:   Kcal:  1600-1800  Protein:  65-75g  Fluid:  1.8L/day   Clayton Bibles, MS, RD, LDN Inpatient Clinical  Dietitian Pager: [phone] After Hours Pager: [phone]

## 2019-03-13 NOTE — TOC Initial Note (Signed)
Transition of Care [facility]) - Initial/Assessment Note    Patient Details  Name: Diane Watkins MRN: [id number] Date of Birth: 01-29-1951  [facility] CM/SW Contact:    Ninfa Meeker, RN Phone Number: [phone] (working remotely) 03/13/2019, 10:18 AM  Clinical Narrative:  68 yr old female from home being treated for COVID 19. Patient on Remdesivir, IV steriods. Case manager will follow for needs as patient medically recovers. May she be blessed to do so.                        Patient Goals and CMS Choice        Expected Discharge Plan and Services                                                Prior Living Arrangements/Services                       Activities of Daily Living Home Assistive Devices/Equipment: None ADL Screening (condition at time of admission) Patient's cognitive ability adequate to safely complete daily activities?: Yes Is the patient deaf or have difficulty hearing?: No Does the patient have difficulty seeing, even when wearing glasses/contacts?: No Does the patient have difficulty concentrating, remembering, or making decisions?: No Patient able to express need for assistance with ADLs?: Yes Does the patient have difficulty dressing or bathing?: No Independently performs ADLs?: Yes (appropriate for developmental age) Does the patient have difficulty walking or climbing stairs?: No Weakness of Legs: None Weakness of Arms/Hands: None  Permission Sought/Granted                  Emotional Assessment              Admission diagnosis:  SOB (shortness of breath) [R06.02] Patient Active Problem List   Diagnosis Date Noted  . COVID-19 virus infection 03/12/2019  . Fever 03/12/2019  . Arthritis of both knees 05/01/2015  . Osteopenia 05/17/2014  . Essential hypertension, benign 05/04/2013  . Hypertriglyceridemia 05/04/2013   PCP:  Mikey Kirschner, MD Pharmacy:   [facility], [place] [address] AT [place] [place] [address] Phone: [phone] Fax: [phone]     Social Determinants of Health (SDOH) Interventions    Readmission Risk Interventions No flowsheet data found.

## 2019-03-13 NOTE — Progress Notes (Signed)
MEDICATION RELATED CONSULT NOTE - INITIAL   Pharmacy Consult for remdesivir Indication: COVID-19  Assessment: 68 yo F presents with SOB and cough. COVID-19 positive. On RA satting in low 90s. CT shows opacities. ALT wnl.  Plan:  Give remdesivir 200mg  IV x 1, then start remdesivir 100mg  IV x 4 days Monitor clinical progress and ALT  Elenor Quinones, PharmD, BCPS, BCIDP Clinical Pharmacist 03/13/2019 3:24 AM

## 2019-03-13 NOTE — Progress Notes (Signed)
Patient arrived on floor alert and oriented x4, vitals stable satting 95% on room air. Pt's daughter's phone number is in the chart for updates. Daughter Diane Watkins was called and updated on patient status and plan of care. All questions were answered.  0620- On call MD paged with request for more cough medicine. No response

## 2019-03-13 NOTE — Progress Notes (Addendum)
Provider Notification Note  Provider Notified: Dr. Waldron Labs  Notification Mode: Secure Chat  Reason: Patient c/o severe headache not resolved with p.o. tylenol. BP 186/95. Lisinopril administered per EMAR.  Response: Fioricet ordered for H/A. PRN Labetalol ordered with parameters.   Additional Information: Orders acknowledged and implemented. Will continue to monitor.  Signed: Beulah Gandy, RN

## 2019-03-13 NOTE — Progress Notes (Signed)
Pt is not on any O2 support. Based on the NIH guideline and the Recovery Trial, steroids are not recommended. Ok to dc dexamethasone per Dr. Waldron Labs.   Onnie Boer, PharmD, BCIDP, AAHIVP, CPP Infectious Disease Pharmacist 03/13/2019 2:06 PM

## 2019-03-13 NOTE — Progress Notes (Signed)
Updated daughter Colletta [place]. Questions encouraged and answered.

## 2019-03-13 NOTE — Plan of Care (Signed)
Problem: Elimination: Goal: Will not experience complications related to urinary retention 03/13/2019 [id number] by Rush Farmer, RN Outcome: Progressing 03/13/2019 123XX123 by Rush Farmer, RN Outcome: Progressing   Problem: Elimination: Goal: Will not experience complications related to urinary retention 03/13/2019 123XX123 by Rush Farmer, RN Outcome: Progressing 03/13/2019 123XX123 by Rush Farmer, RN Outcome: Progressing   Problem: Education: Goal: Knowledge of General Education information will improve Description: Including pain rating scale, medication(s)/side effects and non-pharmacologic comfort measures 03/13/2019 123XX123 by Rush Farmer, RN Outcome: Progressing 03/13/2019 123XX123 by Rush Farmer, RN Outcome: Progressing 03/13/2019 123XX123 by Rush Farmer, RN Outcome: Progressing   Problem: Health Behavior/Discharge Planning: Goal: Ability to manage health-related needs will improve 03/13/2019 123XX123 by Rush Farmer, RN Outcome: Progressing 03/13/2019 123XX123 by Rush Farmer, RN Outcome: Progressing 03/13/2019 123XX123 by Rush Farmer, RN Outcome: Progressing   Problem: Clinical Measurements: Goal: Ability to maintain clinical measurements within normal limits will improve 03/13/2019 123XX123 by Rush Farmer, RN Outcome: Progressing 03/13/2019 123XX123 by Rush Farmer, RN Outcome: Progressing 03/13/2019 123XX123 by Rush Farmer, RN Outcome: Progressing Goal: Will remain free from infection 03/13/2019 123XX123 by Rush Farmer, RN Outcome: Progressing 03/13/2019 123XX123 by Rush Farmer, RN Outcome: Progressing 03/13/2019 123XX123 by Rush Farmer, RN Outcome: Progressing Goal: Diagnostic test results will improve 03/13/2019 123XX123 by Rush Farmer, RN Outcome: Progressing 03/13/2019 123XX123 by Rush Farmer, RN Outcome: Progressing 03/13/2019 123XX123 by Rush Farmer, RN Outcome: Progressing Goal: Respiratory complications will improve 03/13/2019 123XX123 by Rush Farmer, RN Outcome:  Progressing 03/13/2019 123XX123 by Rush Farmer, RN Outcome: Progressing 03/13/2019 123XX123 by Rush Farmer, RN Outcome: Progressing Goal: Cardiovascular complication will be avoided 03/13/2019 123XX123 by Rush Farmer, RN Outcome: Progressing 03/13/2019 123XX123 by Rush Farmer, RN Outcome: Progressing 03/13/2019 123XX123 by Rush Farmer, RN Outcome: Progressing   Problem: Activity: Goal: Risk for activity intolerance will decrease 03/13/2019 123XX123 by Rush Farmer, RN Outcome: Progressing 03/13/2019 123XX123 by Rush Farmer, RN Outcome: Progressing 03/13/2019 123XX123 by Rush Farmer, RN Outcome: Progressing   Problem: Nutrition: Goal: Adequate nutrition will be maintained 03/13/2019 123XX123 by Rush Farmer, RN Outcome: Progressing 03/13/2019 123XX123 by Rush Farmer, RN Outcome: Progressing 03/13/2019 123XX123 by Rush Farmer, RN Outcome: Progressing   Problem: Elimination: Goal: Will not experience complications related to bowel motility 03/13/2019 123XX123 by Rush Farmer, RN Outcome: Progressing 03/13/2019 123XX123 by Rush Farmer, RN Outcome: Progressing 03/13/2019 123XX123 by Rush Farmer, RN Outcome: Progressing Goal: Will not experience complications related to urinary retention 03/13/2019 123XX123 by Rush Farmer, RN Outcome: Progressing 03/13/2019 123XX123 by Rush Farmer, RN Outcome: Progressing 03/13/2019 123XX123 by Rush Farmer, RN Outcome: Progressing   Problem: Pain Managment: Goal: General experience of comfort will improve 03/13/2019 123XX123 by Rush Farmer, RN Outcome: Progressing 03/13/2019 123XX123 by Rush Farmer, RN Outcome: Progressing 03/13/2019 123XX123 by Rush Farmer, RN Outcome: Progressing   Problem: Safety: Goal: Ability to remain free from injury will improve 03/13/2019 123XX123 by Rush Farmer, RN Outcome: Progressing 03/13/2019 123XX123 by Rush Farmer, RN Outcome: Progressing 03/13/2019 123XX123 by Rush Farmer, RN Outcome: Progressing   Problem: Skin Integrity: Goal: Risk for  impaired skin integrity will decrease 03/13/2019 123XX123 by Rush Farmer, RN Outcome: Progressing 03/13/2019 123XX123 by Rush Farmer, RN Outcome: Progressing 03/13/2019 123XX123 by Rush Farmer, RN Outcome: Progressing   Problem: Education: Goal: Knowledge of risk factors and measures for prevention of condition will improve 03/13/2019 123XX123 by Rush Farmer, RN Outcome: Progressing 03/13/2019 123XX123 by Rush Farmer, RN Outcome: Progressing 03/13/2019 123XX123 by Rush Farmer, RN Outcome: Progressing   Problem: Coping: Goal: Psychosocial and  spiritual needs will be supported 03/13/2019 123XX123 by Rush Farmer, RN Outcome: Progressing 03/13/2019 123XX123 by Rush Farmer, RN Outcome: Progressing 03/13/2019 123XX123 by Rush Farmer, RN Outcome: Progressing   Problem: Respiratory: Goal: Will maintain a patent airway 03/13/2019 123XX123 by Rush Farmer, RN Outcome: Progressing 03/13/2019 123XX123 by Rush Farmer, RN Outcome: Progressing 03/13/2019 123XX123 by Rush Farmer, RN Outcome: Progressing Goal: Complications related to the disease process, condition or treatment will be avoided or minimized 03/13/2019 123XX123 by Rush Farmer, RN Outcome: Progressing 03/13/2019 123XX123 by Rush Farmer, RN Outcome: Progressing 03/13/2019 123XX123 by Rush Farmer, RN Outcome: Progressing

## 2019-03-13 NOTE — Progress Notes (Signed)
PROGRESS NOTE                                                                                                                                                                                                             Patient Demographics:    Diane Watkins, is a 68 y.o. female, DOB - 1950/08/31, [id number]  Admit date - 03/12/2019   Admitting Physician Jani Gravel, MD  Outpatient Primary MD for the patient is Luking, Grace Bushy, MD  LOS - 0   Chief Complaint  Patient presents with   Shortness of Breath       Brief Narrative   68 y.o. female,  w hypertension, covid -19 + on 03/08/2019,  apparently presents with c/o fever, cough, dyspnea.  Pt is covid-19 positive.  Pt states has had fever for the past 10 days.  Pt states has had cough, w white sputum as well. Pt noted today that she became more dyspneic.  Slight loose stool.  Pt denies alteration in sense of taste or smell.  Pt presented to ED due to dyspnea. Pt is not sure how she contracted covid-19.  Patient was transferred to [facility] for further care.   Subjective:    Diane Watkins today reports significant weakness, cough, reports mild dyspnea .   Assessment  & Plan :    Principal Problem:   COVID-19 virus infection Active Problems:   Essential hypertension, benign   Fever   COVID-19 virus detected   COVID-19 for pneumonia -Chest significant for bilateral opacities, she is COVID-19 positive, CRP elevated at 1.9, is at risk to develop respiratory failure due to COVID-19 in the setting of her age, obesity and hypertension, she is empirically on IV Decadron, and Remdesivir, initially she becomes tachypneic at one point earlier today at 101, and tachypneic at 27 breaths/min. -Procalcitonin within normal limit, no indication for antibiotics, I will DC Rocephin and azithromycin   COVID-19 Labs  Recent Labs    03/12/19 2025 03/13/19 0019  DDIMER 0.51*  --   FERRITIN  --  205  LDH 263*   --   CRP  --  1.9*    Lab Results  Component Value Date   SARSCOV2NAA Detected (A) 03/08/2019   Hypertension -Continue with home meds.   Code Status : Full  Family Communication  : D/W patient  Disposition Plan  : Home when stable  Barriers For Discharge : Remains on IV Decadron, IV Remdesivir, she becomes tachypneic and tachycardic at 1 point, high risk for progression of ARDS/respiratory failure in setting of COVID-19 for pneumonia  Consults  :  None  Procedures  : None  DVT Prophylaxis  :  Sibley lovenox  Lab Results  Component Value Date   PLT 203 03/12/2019    Antibiotics  :    Anti-infectives (From admission, onward)   Start     Dose/Rate Route Frequency Ordered Stop   03/14/19 1000  remdesivir 100 mg in sodium chloride 0.9 % 250 mL IVPB     100 mg 500 mL/hr over 30 Minutes Intravenous Every 24 hours 03/13/19 0530 03/18/19 0959   03/13/19 0600  remdesivir 200 mg in sodium chloride 0.9 % 250 mL IVPB     200 mg 500 mL/hr over 30 Minutes Intravenous Once 03/13/19 0530 03/13/19 0652   03/12/19 2345  azithromycin (ZITHROMAX) 500 mg in sodium chloride 0.9 % 250 mL IVPB  Status:  Discontinued     500 mg 250 mL/hr over 60 Minutes Intravenous Every 24 hours 03/12/19 2339 03/13/19 0741   03/12/19 2345  cefTRIAXone (ROCEPHIN) 1 g in sodium chloride 0.9 % 100 mL IVPB  Status:  Discontinued     1 g 200 mL/hr over 30 Minutes Intravenous Every 24 hours 03/12/19 2339 03/13/19 0741        Objective:   Vitals:   03/13/19 0400 03/13/19 0430 03/13/19 0537 03/13/19 0733  BP: 131/73 126/76 (!) 147/81 131/89  Pulse: 90 90 83 77  Resp: (!) 24 20 (!) 22 18  Temp:   99 F (37.2 C) 99 F (37.2 C)  TempSrc:   Oral Axillary  SpO2: 90% 95% 93% 99%  Weight:   87.5 kg   Height:   5\' 3"  (1.6 m)     Wt Readings from Last 3 Encounters:  03/13/19 87.5 kg  03/22/18 90.3 kg  10/19/17 88.9 kg     Intake/Output Summary (Last 24 hours) at 03/13/2019 1232 Last data filed at 03/13/2019  0600 Gross per 24 hour  Intake 100 ml  Output --  Net 100 ml     Physical Exam  Awake Alert, frail, Oriented X 3, No new F.N deficits, Normal affect Symmetrical Chest wall movement, Good air movement bilaterally, CTAB RRR,No Gallops,Rubs or new Murmurs, No Parasternal Heave +ve B.Sounds, Abd Soft, No tenderness,No rebound - guarding or rigidity. No Cyanosis, Clubbing or edema, No new Rash or bruise     Data Review:    CBC Recent Labs  Lab 03/12/19 2028  WBC 4.5  HGB 15.3*  HCT 44.5  PLT 203  MCV 91.4  MCH 31.4  MCHC 34.4  RDW 13.3    Chemistries  Recent Labs  Lab 03/12/19 2028  NA 137  K 3.9  CL 100  CO2 25  GLUCOSE 121*  BUN 10  CREATININE 0.85  CALCIUM 9.1  AST 37  ALT 27  ALKPHOS 49  BILITOT 0.6   ------------------------------------------------------------------------------------------------------------------ No results for input(s): CHOL, HDL, LDLCALC, TRIG, CHOLHDL, LDLDIRECT in the last 72 hours.  No results found for: HGBA1C ------------------------------------------------------------------------------------------------------------------ No results for input(s): TSH, T4TOTAL, T3FREE, THYROIDAB in the last 72 hours.  Invalid input(s): FREET3 ------------------------------------------------------------------------------------------------------------------ Recent Labs    03/13/19 0019  FERRITIN 205    Coagulation profile No results for input(s): INR, PROTIME in the last 168 hours.  Recent Labs    03/12/19  2025  DDIMER 0.51*    Cardiac Enzymes Recent Labs  Lab 03/13/19 0019  CKMB 2.2   ------------------------------------------------------------------------------------------------------------------    Component Value Date/Time   BNP 10.0 03/13/2019 0019    Inpatient Medications  Scheduled Meds:  aspirin EC  81 mg Oral QHS   dexamethasone (DECADRON) injection  6 mg Intravenous Q24H   enoxaparin (LOVENOX) injection  40  mg Subcutaneous Q24H   guaiFENesin  1,200 mg Oral BID   lisinopril  20 mg Oral Daily   Continuous Infusions:  [START ON 03/14/2019] remdesivir 100 mg in NS 250 mL     PRN Meds:.albuterol, benzonatate, diphenhydrAMINE, guaiFENesin, polyvinyl alcohol  Micro Results Recent Results (from the past 240 hour(s))  Novel Coronavirus, NAA ([facility])     Status: Abnormal   Collection Time: 03/08/19  1:00 PM   Specimen: Nasopharyngeal(NP) swabs in vial transport medium   NASOPHARYNGE  TESTING  Result Value Ref Range Status   SARS-CoV-2, NAA Detected (A) Not Detected Final    Comment: This nucleic acid amplification test was developed and its performance characteristics determined by [employer]. Nucleic acid amplification tests include PCR and TMA. This test has not been FDA cleared or approved. This test has been authorized by FDA under an Emergency Use Authorization (EUA). This test is only authorized for the duration of time the declaration that circumstances exist justifying the authorization of the emergency use of in vitro diagnostic tests for detection of SARS-CoV-2 virus and/or diagnosis of COVID-19 infection under section 564(b)(1) of the Act, 21 U.S.C. [id number]) (1), unless the authorization is terminated or revoked sooner. When diagnostic testing is negative, the possibility of a false negative result should be considered in the context of a patient's recent exposures and the presence of clinical signs and symptoms consistent with COVID-19. An individual without symptoms of COVID-19 and who is not shedding SARS-CoV-2 virus would  expect to have a negative (not detected) result in this assay.   Culture, blood (routine x 2)     Status: None (Preliminary result)   Collection Time: 03/13/19 12:19 AM   Specimen: Left Antecubital; Blood  Result Value Ref Range Status   Specimen Description LEFT ANTECUBITAL  Final   Special Requests   Final    BOTTLES DRAWN AEROBIC AND  ANAEROBIC Blood Culture adequate volume   Culture   Final    NO GROWTH < 12 HOURS Performed at [facility], [address] [place], [place] [address]    Report Status PENDING  Incomplete  Culture, blood (routine x 2)     Status: None (Preliminary result)   Collection Time: 03/13/19 12:38 AM   Specimen: BLOOD RIGHT HAND  Result Value Ref Range Status   Specimen Description BLOOD RIGHT HAND  Final   Special Requests   Final    BOTTLES DRAWN AEROBIC AND ANAEROBIC Blood Culture adequate volume   Culture   Final    NO GROWTH < 12 HOURS Performed at [facility], [address], [place], [place] [address]    Report Status PENDING  Incomplete    Radiology Reports Ct Angio Chest Pe W Or Wo Contrast  Result Date: 03/13/2019 CLINICAL DATA:  Chest pain and shortness of breath. COVID-19 positive. EXAM: CT ANGIOGRAPHY CHEST WITH CONTRAST TECHNIQUE: Multidetector CT imaging of the chest was performed using the standard protocol during bolus administration of intravenous contrast. Multiplanar CT image reconstructions and MIPs were obtained to evaluate the vascular anatomy. CONTRAST:  150mL OMNIPAQUE IOHEXOL 350 MG/ML SOLN COMPARISON:  Radiograph  yesterday. FINDINGS: Cardiovascular: Breathing motion artifact through the lung bases significantly limits assessment. Allowing for this, no obvious filling defects in the pulmonary arteries to suggest pulmonary embolus. The thoracic aorta is normal in caliber. Mild aortic atherosclerosis. No dissection. Heart is normal in size. No pericardial effusion. Mediastinum/Nodes: No enlarged mediastinal or hilar lymph nodes. Esophagus is decompressed. No visualized thyroid nodule. Lungs/Pleura: Multifocal bilateral patchy ground-glass opacities in the basilar and peripheral predominant distribution. No confluent airspace disease. No septal thickening. Trachea and mainstem bronchi are patent. Upper Abdomen: Possible hepatic steatosis. Cholecystectomy. No acute upper abdominal  findings. Musculoskeletal: There are no acute or suspicious osseous abnormalities. Review of the MIP images confirms the above findings. IMPRESSION: 1. No pulmonary embolus allowing for moderate motion artifact. 2. Multifocal patchy ground-glass opacities in a pattern consistent with COVID-19 pneumonia. Aortic Atherosclerosis (ICD10-I70.0). Electronically Signed   By: Keith Rake M.D.   On: 03/13/2019 01:42   Dg Chest Portable 1 View  Result Date: 03/12/2019 CLINICAL DATA:  Shortness of breath.  Recent COVID-19 diagnosis. EXAM: PORTABLE CHEST 1 VIEW COMPARISON:  None. FINDINGS: The heart, hila, and mediastinum are normal. Right lung is clear. There may be minimal opacity in the periphery of the left lung which is not definitive. No other abnormalities. IMPRESSION: There may be minimal opacity in the periphery of the left lung which is not definitive. No other abnormalities. Electronically Signed   By: Dorise Bullion III M.D   On: 03/12/2019 20:53      Phillips Climes M.D on 03/13/2019 at 12:32 PM  Between 7am to 7pm - Pager - [phone]  After 7pm go to [email or web address] - password [facility]  [facility] -  Office  [phone]

## 2019-03-13 NOTE — Plan of Care (Signed)

## 2019-03-14 ENCOUNTER — Inpatient Hospital Stay (HOSPITAL_COMMUNITY): Payer: PPO

## 2019-03-14 LAB — FERRITIN: Ferritin: 233 ng/mL (ref 11–307)

## 2019-03-14 LAB — COMPREHENSIVE METABOLIC PANEL
ALT: 35 U/L (ref 0–44)
AST: 69 U/L — ABNORMAL HIGH (ref 15–41)
Albumin: 4.2 g/dL (ref 3.5–5.0)
Alkaline Phosphatase: 41 U/L (ref 38–126)
Anion gap: 15 (ref 5–15)
BUN: 22 mg/dL (ref 8–23)
CO2: 24 mmol/L (ref 22–32)
Calcium: 9.3 mg/dL (ref 8.9–10.3)
Chloride: 99 mmol/L (ref 98–111)
Creatinine, Ser: 1.11 mg/dL — ABNORMAL HIGH (ref 0.44–1.00)
GFR calc Af Amer: >60 mL/min (ref >60)
GFR calc non Af Amer: 52 mL/min — ABNORMAL LOW (ref >60)
Glucose, Bld: 95 mg/dL (ref 70–99)
Potassium: 3.8 mmol/L (ref 3.5–5.1)
Sodium: 138 mmol/L (ref 135–145)
Total Bilirubin: 0.6 mg/dL (ref 0.3–1.2)
Total Protein: 7 g/dL (ref 6.5–8.1)

## 2019-03-14 LAB — C-REACTIVE PROTEIN: CRP: 0.9 mg/dL (ref <1.0)

## 2019-03-14 LAB — HIV ANTIBODY (ROUTINE TESTING W REFLEX): HIV Screen 4th Generation wRfx: NONREACTIVE

## 2019-03-14 MED ORDER — KETOROLAC TROMETHAMINE 15 MG/ML IJ SOLN
15.0000 mg | Freq: Once | INTRAMUSCULAR | Status: DC
  Filled 2019-03-14: qty 1

## 2019-03-14 MED ORDER — LOPERAMIDE HCL 2 MG PO CAPS: 2.0000 mg | ORAL_CAPSULE | ORAL | Status: DC | PRN

## 2019-03-14 MED ORDER — PROCHLORPERAZINE EDISYLATE 10 MG/2ML IJ SOLN
5.0000 mg | Freq: Once | INTRAMUSCULAR | Status: DC
  Filled 2019-03-14: qty 2

## 2019-03-14 MED ORDER — DIPHENHYDRAMINE HCL 50 MG/ML IJ SOLN
12.5000 mg | Freq: Once | INTRAMUSCULAR | Status: DC
  Filled 2019-03-14: qty 1

## 2019-03-14 MED ORDER — LOPERAMIDE HCL 2 MG PO CAPS
2.0000 mg | ORAL_CAPSULE | Freq: Four times a day (QID) | ORAL | Status: DC | PRN
  Administered 2019-03-15: 2 mg via ORAL
  Filled 2019-03-14: qty 1

## 2019-03-14 NOTE — Plan of Care (Signed)
  Problem: Elimination: Goal: Will not experience complications related to bowel motility Outcome: Not Progressing  Patient c/o diarrhea. PRN available if needed.

## 2019-03-14 NOTE — Plan of Care (Signed)
Improved activity tolerance and subjective feeling of wellness. Problem: Education: Goal: Knowledge of General Education information will improve Description: Including pain rating scale, medication(s)/side effects and non-pharmacologic comfort measures Outcome: Progressing   Problem: Health Behavior/Discharge Planning: Goal: Ability to manage health-related needs will improve Outcome: Progressing   Problem: Clinical Measurements: Goal: Ability to maintain clinical measurements within normal limits will improve Outcome: Progressing Goal: Will remain free from infection Outcome: Progressing Goal: Diagnostic test results will improve Outcome: Progressing Goal: Respiratory complications will improve Outcome: Progressing Goal: Cardiovascular complication will be avoided Outcome: Progressing   Problem: Activity: Goal: Risk for activity intolerance will decrease Outcome: Progressing   Problem: Nutrition: Goal: Adequate nutrition will be maintained Outcome: Progressing   Problem: Coping: Goal: Level of anxiety will decrease Outcome: Progressing   Problem: Elimination: Goal: Will not experience complications related to bowel motility Outcome: Progressing Goal: Will not experience complications related to urinary retention Outcome: Progressing   Problem: Pain Managment: Goal: General experience of comfort will improve Outcome: Progressing   Problem: Safety: Goal: Ability to remain free from injury will improve Outcome: Progressing   Problem: Skin Integrity: Goal: Risk for impaired skin integrity will decrease Outcome: Progressing   Problem: Education: Goal: Knowledge of risk factors and measures for prevention of condition will improve Outcome: Progressing   Problem: Coping: Goal: Psychosocial and spiritual needs will be supported Outcome: Progressing   Problem: Respiratory: Goal: Will maintain a patent airway Outcome: Progressing Goal: Complications related to  the disease process, condition or treatment will be avoided or minimized Outcome: Progressing

## 2019-03-14 NOTE — Progress Notes (Addendum)
0839: Medication given four cough.  1025: Medication given for migraine.   1100: Call to daughter Colletta [place]. Updated on patient condition.   1500: Patient does not want medications for h/a at this time. Okay to give later or D/C if patient headaches remain stable per MD.  1655: Patient down to CT.

## 2019-03-14 NOTE — Progress Notes (Signed)
2030: Patient walked down to husband's room to visit. No SOB with exertion and patient reports feeling much stronger.  2055: Patient's daughter called and updated on patient status and plan of care. Was happy to hear that patient has been able to visit her husband. All questions answered.

## 2019-03-14 NOTE — Progress Notes (Signed)
PROGRESS NOTE                                                                                                                                                                                                             Patient Demographics:    Diane Watkins, is a 68 y.o. female, DOB - 1950/10/14, [id number]  Admit date - 03/12/2019   Admitting Physician Jani Gravel, MD  Outpatient Primary MD for the patient is Luking, Grace Bushy, MD  LOS - 1   Chief Complaint  Patient presents with   Shortness of Breath       Brief Narrative   68 y.o. female,  w hypertension, covid -19 + on 03/08/2019,  apparently presents with c/o fever, cough, dyspnea.  Pt is covid-19 positive.  Pt states has had fever for the past 10 days.  Pt states has had cough, w white sputum as well. Pt noted today that she became more dyspneic.  Slight loose stool.  Pt denies alteration in sense of taste or smell.  Pt presented to ED due to dyspnea. Pt is not sure how she contracted covid-19.  Patient was transferred to [facility] for further care.   Subjective:    Diane Watkins today has any dyspnea or chest pain, but she complains of significant headache.   Assessment  & Plan :    Principal Problem:   COVID-19 virus infection Active Problems:   Essential hypertension, benign   Fever   COVID-19 virus detected   COVID-19 for pneumonia -Chest significant for bilateral opacities, she is COVID-19 positive, CRP elevated at 1.9, is at risk to develop respiratory failure due to COVID-19 in the setting of her age, obesity and hypertension, she is empirically on IV Decadron, and Remdesivir, CRP has normalized which is reassuring, ferritin within normal limit, no tachypnea or respiratory symptoms today which is reassuring . -Procalcitonin within normal limit, no indication for antibiotics, I will DC Rocephin and azithromycin   COVID-19 Labs  Recent Labs    03/12/19 2025 03/13/19 0019  03/14/19 0425  DDIMER 0.51*  --   --   FERRITIN  --  205 233  LDH 263*  --   --   CRP  --  1.9* 0.9    Lab Results  Component Value Date   SARSCOV2NAA Detected (A) 03/08/2019   Hypertension -  Continue with home meds.  Headache -Patient continues to complain of headache, no focal deficits, will obtain CT head to rule out acute findings, and significant by being on Fioricet/Excedrin, will try one-time of IV Benadryl/IV Compazine/IV Toradol cocktail.   Code Status : Full  Family Communication  : D/W patient  Disposition Plan  : Home when stable  Consults  :  None  Procedures  : None  DVT Prophylaxis  :  [place] lovenox  Lab Results  Component Value Date   PLT 203 03/12/2019    Antibiotics  :    Anti-infectives (From admission, onward)   Start     Dose/Rate Route Frequency Ordered Stop   03/14/19 1000  remdesivir 100 mg in sodium chloride 0.9 % 250 mL IVPB     100 mg 500 mL/hr over 30 Minutes Intravenous Every 24 hours 03/13/19 0530 03/18/19 0959   03/13/19 0600  remdesivir 200 mg in sodium chloride 0.9 % 250 mL IVPB     200 mg 500 mL/hr over 30 Minutes Intravenous Once 03/13/19 0530 03/13/19 0655   03/12/19 2345  azithromycin (ZITHROMAX) 500 mg in sodium chloride 0.9 % 250 mL IVPB  Status:  Discontinued     500 mg 250 mL/hr over 60 Minutes Intravenous Every 24 hours 03/12/19 2339 03/13/19 0741   03/12/19 2345  cefTRIAXone (ROCEPHIN) 1 g in sodium chloride 0.9 % 100 mL IVPB  Status:  Discontinued     1 g 200 mL/hr over 30 Minutes Intravenous Every 24 hours 03/12/19 2339 03/13/19 0741        Objective:   Vitals:   03/13/19 2000 03/14/19 0409 03/14/19 0725 03/14/19 0757  BP:  (!) 144/85 116/65   Pulse:  90 64   Resp:  17 17   Temp: 98.8 F (37.1 C) (!) 97.5 F (36.4 C) 98.3 F (36.8 C)   TempSrc: Oral Oral    SpO2:  96% 94% 96%  Weight:      Height:        Wt Readings from Last 3 Encounters:  03/13/19 87.5 kg  03/22/18 90.3 kg  10/19/17 88.9 kg      Intake/Output Summary (Last 24 hours) at 03/14/2019 1412 Last data filed at 03/14/2019 1100 Gross per 24 hour  Intake 276.39 ml  Output --  Net 276.39 ml     Physical Exam  Awake Alert, Oriented X 3, No new F.N deficits, Normal affect Symmetrical Chest wall movement, Good air movement bilaterally, CTAB RRR,No Gallops,Rubs or new Murmurs, No Parasternal Heave +ve B.Sounds, Abd Soft, No tenderness, No rebound - guarding or rigidity. No Cyanosis, Clubbing or edema, No new Rash or bruise      Data Review:    CBC Recent Labs  Lab 03/12/19 2028  WBC 4.5  HGB 15.3*  HCT 44.5  PLT 203  MCV 91.4  MCH 31.4  MCHC 34.4  RDW 13.3    Chemistries  Recent Labs  Lab 03/12/19 2028 03/14/19 0425  NA 137 138  K 3.9 3.8  CL 100 99  CO2 25 24  GLUCOSE 121* 95  BUN 10 22  CREATININE 0.85 1.11*  CALCIUM 9.1 9.3  AST 37 69*  ALT 27 35  ALKPHOS 49 41  BILITOT 0.6 0.6   ------------------------------------------------------------------------------------------------------------------ No results for input(s): CHOL, HDL, LDLCALC, TRIG, CHOLHDL, LDLDIRECT in the last 72 hours.  No results found for: HGBA1C ------------------------------------------------------------------------------------------------------------------ No results for input(s): TSH, T4TOTAL, T3FREE, THYROIDAB in the last 72 hours.  Invalid input(s): FREET3 ------------------------------------------------------------------------------------------------------------------  Recent Labs    03/13/19 0019 03/14/19 0425  FERRITIN 205 233    Coagulation profile No results for input(s): INR, PROTIME in the last 168 hours.  Recent Labs    03/12/19 2025  DDIMER 0.51*    Cardiac Enzymes Recent Labs  Lab 03/13/19 0019  CKMB 2.2   ------------------------------------------------------------------------------------------------------------------    Component Value Date/Time   BNP 10.0 03/13/2019 0019     Inpatient Medications  Scheduled Meds:  aspirin EC  81 mg Oral QHS   enoxaparin (LOVENOX) injection  40 mg Subcutaneous Q24H   feeding supplement (ENSURE ENLIVE)  237 mL Oral BID BM   guaiFENesin  1,200 mg Oral BID   lisinopril  20 mg Oral Daily   multivitamin with minerals  1 tablet Oral Daily   Continuous Infusions:  remdesivir 100 mg in NS 250 mL Stopped (03/14/19 1003)   PRN Meds:.acetaminophen, albuterol, aspirin-acetaminophen-caffeine, benzonatate, diphenhydrAMINE, guaiFENesin, labetalol, polyvinyl alcohol  Micro Results Recent Results (from the past 240 hour(s))  Novel Coronavirus, NAA ([facility])     Status: Abnormal   Collection Time: 03/08/19  1:00 PM   Specimen: Nasopharyngeal(NP) swabs in vial transport medium   NASOPHARYNGE  TESTING  Result Value Ref Range Status   SARS-CoV-2, NAA Detected (A) Not Detected Final    Comment: This nucleic acid amplification test was developed and its performance characteristics determined by [employer]. Nucleic acid amplification tests include PCR and TMA. This test has not been FDA cleared or approved. This test has been authorized by FDA under an Emergency Use Authorization (EUA). This test is only authorized for the duration of time the declaration that circumstances exist justifying the authorization of the emergency use of in vitro diagnostic tests for detection of SARS-CoV-2 virus and/or diagnosis of COVID-19 infection under section 564(b)(1) of the Act, 21 U.S.C. [id number]) (1), unless the authorization is terminated or revoked sooner. When diagnostic testing is negative, the possibility of a false negative result should be considered in the context of a patient's recent exposures and the presence of clinical signs and symptoms consistent with COVID-19. An individual without symptoms of COVID-19 and who is not shedding SARS-CoV-2 virus would  expect to have a negative (not detected) result in this  assay.   Culture, blood (routine x 2)     Status: None (Preliminary result)   Collection Time: 03/13/19 12:19 AM   Specimen: Left Antecubital; Blood  Result Value Ref Range Status   Specimen Description LEFT ANTECUBITAL  Final   Special Requests   Final    BOTTLES DRAWN AEROBIC AND ANAEROBIC Blood Culture adequate volume   Culture   Final    NO GROWTH 1 DAY Performed at [facility], [address] [place], [place] [address]    Report Status PENDING  Incomplete  Culture, blood (routine x 2)     Status: None (Preliminary result)   Collection Time: 03/13/19 12:38 AM   Specimen: BLOOD RIGHT HAND  Result Value Ref Range Status   Specimen Description BLOOD RIGHT HAND  Final   Special Requests   Final    BOTTLES DRAWN AEROBIC AND ANAEROBIC Blood Culture adequate volume   Culture   Final    NO GROWTH 1 DAY Performed at [facility], [address] [place], [place] [address]    Report Status PENDING  Incomplete    Radiology Reports Ct Angio Chest Pe W Or Wo Contrast  Result Date: 03/13/2019 CLINICAL DATA:  Chest pain and shortness of breath. COVID-19 positive. EXAM: CT  ANGIOGRAPHY CHEST WITH CONTRAST TECHNIQUE: Multidetector CT imaging of the chest was performed using the standard protocol during bolus administration of intravenous contrast. Multiplanar CT image reconstructions and MIPs were obtained to evaluate the vascular anatomy. CONTRAST:  150mL OMNIPAQUE IOHEXOL 350 MG/ML SOLN COMPARISON:  Radiograph yesterday. FINDINGS: Cardiovascular: Breathing motion artifact through the lung bases significantly limits assessment. Allowing for this, no obvious filling defects in the pulmonary arteries to suggest pulmonary embolus. The thoracic aorta is normal in caliber. Mild aortic atherosclerosis. No dissection. Heart is normal in size. No pericardial effusion. Mediastinum/Nodes: No enlarged mediastinal or hilar lymph nodes. Esophagus is decompressed. No visualized thyroid nodule. Lungs/Pleura:  Multifocal bilateral patchy ground-glass opacities in the basilar and peripheral predominant distribution. No confluent airspace disease. No septal thickening. Trachea and mainstem bronchi are patent. Upper Abdomen: Possible hepatic steatosis. Cholecystectomy. No acute upper abdominal findings. Musculoskeletal: There are no acute or suspicious osseous abnormalities. Review of the MIP images confirms the above findings. IMPRESSION: 1. No pulmonary embolus allowing for moderate motion artifact. 2. Multifocal patchy ground-glass opacities in a pattern consistent with COVID-19 pneumonia. Aortic Atherosclerosis (ICD10-I70.0). Electronically Signed   By: Keith Rake M.D.   On: 03/13/2019 01:42   Dg Chest Portable 1 View  Result Date: 03/12/2019 CLINICAL DATA:  Shortness of breath.  Recent COVID-19 diagnosis. EXAM: PORTABLE CHEST 1 VIEW COMPARISON:  None. FINDINGS: The heart, hila, and mediastinum are normal. Right lung is clear. There may be minimal opacity in the periphery of the left lung which is not definitive. No other abnormalities. IMPRESSION: There may be minimal opacity in the periphery of the left lung which is not definitive. No other abnormalities. Electronically Signed   By: Dorise Bullion III M.D   On: 03/12/2019 20:53      Phillips Climes M.D on 03/14/2019 at 2:12 PM  Between 7am to 7pm - Pager - [phone]  After 7pm go to [email or web address] - password [facility]  [facility] -  Office  [phone]

## 2019-03-15 LAB — CBC
HCT: 40.5 % (ref 36.0–46.0)
Hemoglobin: 13.6 g/dL (ref 12.0–15.0)
MCH: 30.7 pg (ref 26.0–34.0)
MCHC: 33.6 g/dL (ref 30.0–36.0)
MCV: 91.4 fL (ref 80.0–100.0)
Platelets: 238 10*3/uL (ref 150–400)
RBC: 4.43 MIL/uL (ref 3.87–5.11)
RDW: 13.5 % (ref 11.5–15.5)
WBC: 6.3 10*3/uL (ref 4.0–10.5)
nRBC: 0 % (ref 0.0–0.2)

## 2019-03-15 LAB — COMPREHENSIVE METABOLIC PANEL
ALT: 33 U/L (ref 0–44)
AST: 62 U/L — ABNORMAL HIGH (ref 15–41)
Albumin: 3.7 g/dL (ref 3.5–5.0)
Alkaline Phosphatase: 44 U/L (ref 38–126)
Anion gap: 12 (ref 5–15)
BUN: 21 mg/dL (ref 8–23)
CO2: 24 mmol/L (ref 22–32)
Calcium: 8.6 mg/dL — ABNORMAL LOW (ref 8.9–10.3)
Chloride: 102 mmol/L (ref 98–111)
Creatinine, Ser: 0.97 mg/dL (ref 0.44–1.00)
GFR calc Af Amer: >60 mL/min (ref >60)
GFR calc non Af Amer: >60 mL/min (ref >60)
Glucose, Bld: 133 mg/dL — ABNORMAL HIGH (ref 70–99)
Potassium: 3.9 mmol/L (ref 3.5–5.1)
Sodium: 138 mmol/L (ref 135–145)
Total Bilirubin: 0.6 mg/dL (ref 0.3–1.2)
Total Protein: 6.2 g/dL — ABNORMAL LOW (ref 6.5–8.1)

## 2019-03-15 LAB — C-REACTIVE PROTEIN: CRP: 3.1 mg/dL — ABNORMAL HIGH (ref <1.0)

## 2019-03-15 LAB — FERRITIN: Ferritin: 228 ng/mL (ref 11–307)

## 2019-03-15 MED ORDER — DEXAMETHASONE SODIUM PHOSPHATE 10 MG/ML IJ SOLN
6.0000 mg | Freq: Every day | INTRAMUSCULAR | Status: DC
  Administered 2019-03-15 – 2019-03-17 (×3): 6 mg via INTRAVENOUS
  Filled 2019-03-15 (×3): qty 1

## 2019-03-15 NOTE — Plan of Care (Signed)

## 2019-03-15 NOTE — Progress Notes (Signed)
Patient's daughter Colletta [place] called and updated on plan of care. All questions answered. Patient walked down to husband's room for visit

## 2019-03-15 NOTE — Progress Notes (Signed)
1058: Call to daughter Colletta [place]. Updated on patient condition. Aware of patient potential discharge. Will call if anything changes.

## 2019-03-15 NOTE — Progress Notes (Signed)
PROGRESS NOTE                                                                                                                                                                                                             Patient Demographics:    Diane Watkins, is a 68 y.o. female, DOB - [birth year], [id number]  Admit date - 03/12/2019   Admitting Physician Jani Gravel, MD  Outpatient Primary MD for the patient is Luking, Grace Bushy, MD  LOS - 2   Chief Complaint  Patient presents with  . Shortness of Breath       Brief Narrative   68 y.o. female,  w hypertension, covid -19 + on 03/08/2019,  apparently presents with c/o fever, cough, dyspnea.  Pt is covid-19 positive.  Pt states has had fever for the past 10 days.  Pt states has had cough, w white sputum as well. Pt noted today that she became more dyspneic.  Slight loose stool.  Pt denies alteration in sense of taste or smell.  Pt presented to ED due to dyspnea. Pt is not sure how she contracted covid-19.  Patient was transferred to [facility] for further care.   Subjective:    Diane Watkins today reports she is feeling weak, headache has improved, denies any dyspnea today.   Assessment  & Plan :    Principal Problem:   COVID-19 virus infection Active Problems:   Essential hypertension, benign   Fever   COVID-19 virus detected   COVID-19 for pneumonia -Chest significant for bilateral opacities, she is COVID-19 positive, CRP elevated at 1.9 on admission,she  is at risk to develop respiratory failure due to COVID-19 in the setting of her age, obesity and hypertension, she is empirically on IV Decadron, and Remdesivir, CRP initially has normalized, but it more than doubled today at 3.1, so we will continue with current regimen of the severe and steroids. -Procalcitonin within normal limit, no indication for antibiotics, I will DC Rocephin and azithromycin   COVID-19 Labs  Recent Labs    03/12/19  2025 03/13/19 0019 03/14/19 0425 03/15/19 0530  DDIMER 0.51*  --   --   --   FERRITIN  --  205 233 228  LDH 263*  --   --   --   CRP  --  1.9* 0.9 3.1*  Lab Results  Component Value Date   SARSCOV2NAA Detected (A) 03/08/2019   Hypertension -Continue with home meds.  Headache -Patient had significant headache since admission, CT head was obtained, with no acute finding, headache significantly improved.  .    Code Status : Full  Family Communication  : D/W patient  Disposition Plan  : Home when stable  Consults  :  None  Procedures  : None  DVT Prophylaxis  :  [place] lovenox  Lab Results  Component Value Date   PLT 238 03/15/2019    Antibiotics  :    Anti-infectives (From admission, onward)   Start     Dose/Rate Route Frequency Ordered Stop   03/14/19 1000  remdesivir 100 mg in sodium chloride 0.9 % 250 mL IVPB     100 mg 500 mL/hr over 30 Minutes Intravenous Every 24 hours 03/13/19 0530 03/18/19 0959   03/13/19 0600  remdesivir 200 mg in sodium chloride 0.9 % 250 mL IVPB     200 mg 500 mL/hr over 30 Minutes Intravenous Once 03/13/19 0530 03/13/19 0655   03/12/19 2345  azithromycin (ZITHROMAX) 500 mg in sodium chloride 0.9 % 250 mL IVPB  Status:  Discontinued     500 mg 250 mL/hr over 60 Minutes Intravenous Every 24 hours 03/12/19 2339 03/13/19 0741   03/12/19 2345  cefTRIAXone (ROCEPHIN) 1 g in sodium chloride 0.9 % 100 mL IVPB  Status:  Discontinued     1 g 200 mL/hr over 30 Minutes Intravenous Every 24 hours 03/12/19 2339 03/13/19 0741        Objective:   Vitals:   03/14/19 1650 03/14/19 2002 03/15/19 0400 03/15/19 0800  BP: 126/88 127/87 131/77 116/69  Pulse: 81 92 93 91  Resp:   18   Temp: 98.5 F (36.9 C) 98.2 F (36.8 C) 99.2 F (37.3 C) 100 F (37.8 C)  TempSrc:  Oral Oral Oral  SpO2: 98% 94% 95% 95%  Weight:      Height:        Wt Readings from Last 3 Encounters:  03/13/19 87.5 kg  03/22/18 90.3 kg  10/19/17 88.9 kg      Intake/Output Summary (Last 24 hours) at 03/15/2019 1350 Last data filed at 03/15/2019 1110 Gross per 24 hour  Intake 1075.56 ml  Output -  Net 1075.56 ml     Physical Exam  Awake Alert, Oriented X 3, No new F.N deficits, Normal affect Symmetrical Chest wall movement, Good air movement bilaterally, CTAB RRR,No Gallops,Rubs or new Murmurs, No Parasternal Heave +ve B.Sounds, Abd Soft, No tenderness, No rebound - guarding or rigidity. No Cyanosis, Clubbing or edema, No new Rash or bruise       Data Review:    CBC Recent Labs  Lab 03/12/19 2028 03/15/19 0530  WBC 4.5 6.3  HGB 15.3* 13.6  HCT 44.5 40.5  PLT 203 238  MCV 91.4 91.4  MCH 31.4 30.7  MCHC 34.4 33.6  RDW 13.3 13.5    Chemistries  Recent Labs  Lab 03/12/19 2028 03/14/19 0425 03/15/19 0530  NA 137 138 138  K 3.9 3.8 3.9  CL 100 99 102  CO2 25 24 24   GLUCOSE 121* 95 133*  BUN 10 22 21   CREATININE 0.85 1.11* 0.97  CALCIUM 9.1 9.3 8.6*  AST 37 69* 62*  ALT 27 35 33  ALKPHOS 49 41 44  BILITOT 0.6 0.6 0.6   ------------------------------------------------------------------------------------------------------------------ No results for input(s): CHOL, HDL, LDLCALC, TRIG, CHOLHDL, LDLDIRECT in  the last 72 hours.  No results found for: HGBA1C ------------------------------------------------------------------------------------------------------------------ No results for input(s): TSH, T4TOTAL, T3FREE, THYROIDAB in the last 72 hours.  Invalid input(s): FREET3 ------------------------------------------------------------------------------------------------------------------ Recent Labs    03/14/19 0425 03/15/19 0530  FERRITIN 233 228    Coagulation profile No results for input(s): INR, PROTIME in the last 168 hours.  Recent Labs    03/12/19 2025  DDIMER 0.51*    Cardiac Enzymes Recent Labs  Lab 03/13/19 0019  CKMB 2.2    ------------------------------------------------------------------------------------------------------------------    Component Value Date/Time   BNP 10.0 03/13/2019 0019    Inpatient Medications  Scheduled Meds: . aspirin EC  81 mg Oral QHS  . enoxaparin (LOVENOX) injection  40 mg Subcutaneous Q24H  . feeding supplement (ENSURE ENLIVE)  237 mL Oral BID BM  . guaiFENesin  1,200 mg Oral BID  . lisinopril  20 mg Oral Daily  . multivitamin with minerals  1 tablet Oral Daily   Continuous Infusions: . remdesivir 100 mg in NS 250 mL Stopped (03/15/19 1029)   PRN Meds:.acetaminophen, albuterol, aspirin-acetaminophen-caffeine, benzonatate, diphenhydrAMINE, guaiFENesin, labetalol, loperamide, polyvinyl alcohol  Micro Results Recent Results (from the past 240 hour(s))  Novel Coronavirus, NAA ([facility])     Status: Abnormal   Collection Time: 03/08/19  1:00 PM   Specimen: Nasopharyngeal(NP) swabs in vial transport medium   NASOPHARYNGE  TESTING  Result Value Ref Range Status   SARS-CoV-2, NAA Detected (A) Not Detected Final    Comment: This nucleic acid amplification test was developed and its performance characteristics determined by [employer]. Nucleic acid amplification tests include PCR and TMA. This test has not been FDA cleared or approved. This test has been authorized by FDA under an Emergency Use Authorization (EUA). This test is only authorized for the duration of time the declaration that circumstances exist justifying the authorization of the emergency use of in vitro diagnostic tests for detection of SARS-CoV-2 virus and/or diagnosis of COVID-19 infection under section 564(b)(1) of the Act, 21 U.S.C. [id number]) (1), unless the authorization is terminated or revoked sooner. When diagnostic testing is negative, the possibility of a false negative result should be considered in the context of a patient's recent exposures and the presence of clinical signs and  symptoms consistent with COVID-19. An individual without symptoms of COVID-19 and who is not shedding SARS-CoV-2 virus would  expect to have a negative (not detected) result in this assay.   Culture, blood (routine x 2)     Status: None (Preliminary result)   Collection Time: 03/13/19 12:19 AM   Specimen: Left Antecubital; Blood  Result Value Ref Range Status   Specimen Description LEFT ANTECUBITAL  Final   Special Requests   Final    BOTTLES DRAWN AEROBIC AND ANAEROBIC Blood Culture adequate volume   Culture   Final    NO GROWTH 2 DAYS Performed at [facility] [address], [place], [place] [address]    Report Status PENDING  Incomplete  Culture, blood (routine x 2)     Status: None (Preliminary result)   Collection Time: 03/13/19 12:38 AM   Specimen: BLOOD RIGHT HAND  Result Value Ref Range Status   Specimen Description BLOOD RIGHT HAND  Final   Special Requests   Final    BOTTLES DRAWN AEROBIC AND ANAEROBIC Blood Culture adequate volume   Culture   Final    NO GROWTH 2 DAYS Performed at [facility], [address], [place], [place] [address]    Report Status PENDING  Incomplete  Radiology Reports Ct Head Wo Contrast  Result Date: 03/14/2019 CLINICAL DATA:  C/o headache yesterday. No dizziness or blurry vision. No hx of stroke EXAM: CT HEAD WITHOUT CONTRAST TECHNIQUE: Contiguous axial images were obtained from the base of the skull through the vertex without intravenous contrast. COMPARISON:  None. FINDINGS: Brain: No evidence of acute infarction, hemorrhage, hydrocephalus, extra-axial collection or mass lesion/mass effect. Vascular: No hyperdense vessel or unexpected calcification. Skull: Normal. Negative for fracture or focal lesion. Sinuses/Orbits: No acute finding. Other: None. IMPRESSION: No acute intracranial process. Electronically Signed   By: Audie Pinto M.D.   On: 03/14/2019 19:08   Ct Angio Chest Pe W Or Wo Contrast  Result Date: 03/13/2019 CLINICAL  DATA:  Chest pain and shortness of breath. COVID-19 positive. EXAM: CT ANGIOGRAPHY CHEST WITH CONTRAST TECHNIQUE: Multidetector CT imaging of the chest was performed using the standard protocol during bolus administration of intravenous contrast. Multiplanar CT image reconstructions and MIPs were obtained to evaluate the vascular anatomy. CONTRAST:  154mL OMNIPAQUE IOHEXOL 350 MG/ML SOLN COMPARISON:  Radiograph yesterday. FINDINGS: Cardiovascular: Breathing motion artifact through the lung bases significantly limits assessment. Allowing for this, no obvious filling defects in the pulmonary arteries to suggest pulmonary embolus. The thoracic aorta is normal in caliber. Mild aortic atherosclerosis. No dissection. Heart is normal in size. No pericardial effusion. Mediastinum/Nodes: No enlarged mediastinal or hilar lymph nodes. Esophagus is decompressed. No visualized thyroid nodule. Lungs/Pleura: Multifocal bilateral patchy ground-glass opacities in the basilar and peripheral predominant distribution. No confluent airspace disease. No septal thickening. Trachea and mainstem bronchi are patent. Upper Abdomen: Possible hepatic steatosis. Cholecystectomy. No acute upper abdominal findings. Musculoskeletal: There are no acute or suspicious osseous abnormalities. Review of the MIP images confirms the above findings. IMPRESSION: 1. No pulmonary embolus allowing for moderate motion artifact. 2. Multifocal patchy ground-glass opacities in a pattern consistent with COVID-19 pneumonia. Aortic Atherosclerosis (ICD10-I70.0). Electronically Signed   By: Keith Rake M.D.   On: 03/13/2019 01:42   Dg Chest Portable 1 View  Result Date: 03/12/2019 CLINICAL DATA:  Shortness of breath.  Recent COVID-19 diagnosis. EXAM: PORTABLE CHEST 1 VIEW COMPARISON:  None. FINDINGS: The heart, hila, and mediastinum are normal. Right lung is clear. There may be minimal opacity in the periphery of the left lung which is not definitive. No other  abnormalities. IMPRESSION: There may be minimal opacity in the periphery of the left lung which is not definitive. No other abnormalities. Electronically Signed   By: Dorise Bullion III M.D   On: 03/12/2019 20:53      Phillips Climes M.D on 03/15/2019 at 1:50 PM  Between 7am to 7pm - Pager - [phone]  After 7pm go to [email or web address] - password [facility]  [facility] -  Office  [phone]

## 2019-03-16 DIAGNOSIS — J1289 Other viral pneumonia: Secondary | ICD-10-CM

## 2019-03-16 LAB — COMPREHENSIVE METABOLIC PANEL
ALT: 35 U/L (ref 0–44)
AST: 54 U/L — ABNORMAL HIGH (ref 15–41)
Albumin: 3.6 g/dL (ref 3.5–5.0)
Alkaline Phosphatase: 44 U/L (ref 38–126)
Anion gap: 19 — ABNORMAL HIGH (ref 5–15)
BUN: 17 mg/dL (ref 8–23)
CO2: 15 mmol/L — ABNORMAL LOW (ref 22–32)
Calcium: 8.9 mg/dL (ref 8.9–10.3)
Chloride: 103 mmol/L (ref 98–111)
Creatinine, Ser: 0.91 mg/dL (ref 0.44–1.00)
GFR calc Af Amer: >60 mL/min (ref >60)
GFR calc non Af Amer: >60 mL/min (ref >60)
Glucose, Bld: 182 mg/dL — ABNORMAL HIGH (ref 70–99)
Potassium: 4.8 mmol/L (ref 3.5–5.1)
Sodium: 137 mmol/L (ref 135–145)
Total Bilirubin: 0.4 mg/dL (ref 0.3–1.2)
Total Protein: 6.4 g/dL — ABNORMAL LOW (ref 6.5–8.1)

## 2019-03-16 LAB — FERRITIN: Ferritin: 229 ng/mL (ref 11–307)

## 2019-03-16 LAB — C-REACTIVE PROTEIN: CRP: 5.3 mg/dL — ABNORMAL HIGH (ref <1.0)

## 2019-03-16 NOTE — Progress Notes (Signed)
PROGRESS NOTE                                                                                                                                                                                                             Patient Demographics:    Diane Watkins, is a 68 y.o. female, DOB - [birth year], [id number]  Admit date - 03/12/2019   Admitting Physician Jani Gravel, MD  Outpatient Primary MD for the patient is Luking, Grace Bushy, MD  LOS - 3   Chief Complaint  Patient presents with   Shortness of Breath       Brief Narrative   68 y.o. female,  w hypertension, covid -19 + on 03/08/2019,  apparently presents with c/o fever, cough, dyspnea.  Pt is covid-19 positive.  Pt states has had fever for the past 10 days.  Pt states has had cough, w white sputum as well. Pt noted today that she became more dyspneic.  Slight loose stool.  Pt denies alteration in sense of taste or smell.  Pt presented to ED due to dyspnea. Pt is not sure how she contracted covid-19.  Patient was transferred to [facility] for further care.   Subjective:   Patient states that she is feeling better.  Not as short of breath as before.  No nausea vomiting.  Concerned about her husband was also hospitalized for COVID-19.   Assessment  & Plan :   Pneumonia due to COVID-19  Patient did not require oxygen.  She did have bilateral opacities on chest x-ray and CT angiogram.  No PE was noted.  She did have elevation in her inflammatory markers.  Patient was started on steroids and Remdesivir.  Increased and CRP levels noted.  Reason for this is not entirely clear.  Patient is feeling better from a symptom standpoint.  Will not make any changes to her treatment plan at this time.  Today will be day 4 of her Remdesivir.  Procalcitonin was within normal limits.  Antibiotics were discontinued.  Continue mobilization, prone positioning as much as possible, incentive spirometry.  Trend inflammatory  markers.  COVID-19 Labs  Recent Labs    03/14/19 0425 03/15/19 0530 03/16/19 0046  FERRITIN 233 228 229  CRP 0.9 3.1* 5.3*    Lab Results  Component Value Date   SARSCOV2NAA Detected (A) 03/08/2019  Essential hypertension Blood pressure is reasonably well controlled.  Continue with home medications.  She is on lisinopril.  Headache Headache appears to have improved.  CT scan done at the time of admission did not show any acute findings.    DVT Prophylaxis  :  [place] lovenox Code Status : Full Family Communication  : D/W patient.  Will discuss with family later today. Disposition Plan  : Home when stable  Consults  :  None  Procedures  : None    Lab Results  Component Value Date   PLT 238 03/15/2019    Antibiotics  :    Anti-infectives (From admission, onward)   Start     Dose/Rate Route Frequency Ordered Stop   03/14/19 1000  remdesivir 100 mg in sodium chloride 0.9 % 250 mL IVPB     100 mg 500 mL/hr over 30 Minutes Intravenous Every 24 hours 03/13/19 0530 03/18/19 0959   03/13/19 0600  remdesivir 200 mg in sodium chloride 0.9 % 250 mL IVPB     200 mg 500 mL/hr over 30 Minutes Intravenous Once 03/13/19 0530 03/13/19 0655   03/12/19 2345  azithromycin (ZITHROMAX) 500 mg in sodium chloride 0.9 % 250 mL IVPB  Status:  Discontinued     500 mg 250 mL/hr over 60 Minutes Intravenous Every 24 hours 03/12/19 2339 03/13/19 0741   03/12/19 2345  cefTRIAXone (ROCEPHIN) 1 g in sodium chloride 0.9 % 100 mL IVPB  Status:  Discontinued     1 g 200 mL/hr over 30 Minutes Intravenous Every 24 hours 03/12/19 2339 03/13/19 0741        Objective:   Vitals:   03/15/19 1940 03/16/19 0350 03/16/19 0735 03/16/19 0822  BP: (!) 145/66 137/85 (!) 141/77   Pulse: 83 76 64   Resp: (!) 21     Temp: 98.3 F (36.8 C) (!) 97.5 F (36.4 C) 98.4 F (36.9 C)   TempSrc: Oral Oral    SpO2: 92% 96% 96% 96%  Weight:      Height:        Wt Readings from Last 3 Encounters:  03/13/19 87.5  kg  03/22/18 90.3 kg  10/19/17 88.9 kg     Intake/Output Summary (Last 24 hours) at 03/16/2019 1223 Last data filed at 03/16/2019 1131 Gross per 24 hour  Intake 1367.64 ml  Output --  Net 1367.64 ml     Physical Exam  General appearance: Awake alert.  In no distress Resp: Normal effort at rest.  Coarse breath sounds bilaterally with few crackles at the bases.  No wheezing or rhonchi. Cardio: S1-S2 is normal regular.  No S3-S4.  No rubs murmurs or bruit GI: Abdomen is soft.  Nontender nondistended.  Bowel sounds are present normal.  No masses organomegaly Extremities: No edema.  Full range of motion of lower extremities. Neurologic: Alert and oriented x3.  No focal neurological deficits.     Data Review:    CBC Recent Labs  Lab 03/12/19 2028 03/15/19 0530  WBC 4.5 6.3  HGB 15.3* 13.6  HCT 44.5 40.5  PLT 203 238  MCV 91.4 91.4  MCH 31.4 30.7  MCHC 34.4 33.6  RDW 13.3 13.5    Chemistries  Recent Labs  Lab 03/12/19 2028 03/14/19 0425 03/15/19 0530 03/16/19 0046  NA 137 138 138 137  K 3.9 3.8 3.9 4.8  CL 100 99 102 103  CO2 25 24 24  15*  GLUCOSE 121* 95 133* 182*  BUN 10 22 21  17  CREATININE 0.85 1.11* 0.97 0.91  CALCIUM 9.1 9.3 8.6* 8.9  AST 37 69* 62* 54*  ALT 27 35 33 35  ALKPHOS 49 41 44 44  BILITOT 0.6 0.6 0.6 0.4     Inpatient Medications  Scheduled Meds:  aspirin EC  81 mg Oral QHS   dexamethasone (DECADRON) injection  6 mg Intravenous Q breakfast   enoxaparin (LOVENOX) injection  40 mg Subcutaneous Q24H   feeding supplement (ENSURE ENLIVE)  237 mL Oral BID BM   guaiFENesin  1,200 mg Oral BID   lisinopril  20 mg Oral Daily   multivitamin with minerals  1 tablet Oral Daily   Continuous Infusions:  remdesivir 100 mg in NS 250 mL Stopped (03/16/19 0937)   PRN Meds:.acetaminophen, albuterol, aspirin-acetaminophen-caffeine, benzonatate, diphenhydrAMINE, guaiFENesin, labetalol, loperamide, polyvinyl alcohol  Micro Results Recent  Results (from the past 240 hour(s))  Novel Coronavirus, NAA ([facility])     Status: Abnormal   Collection Time: 03/08/19  1:00 PM   Specimen: Nasopharyngeal(NP) swabs in vial transport medium   NASOPHARYNGE  TESTING  Result Value Ref Range Status   SARS-CoV-2, NAA Detected (A) Not Detected Final    Comment: This nucleic acid amplification test was developed and its performance characteristics determined by [employer]. Nucleic acid amplification tests include PCR and TMA. This test has not been FDA cleared or approved. This test has been authorized by FDA under an Emergency Use Authorization (EUA). This test is only authorized for the duration of time the declaration that circumstances exist justifying the authorization of the emergency use of in vitro diagnostic tests for detection of SARS-CoV-2 virus and/or diagnosis of COVID-19 infection under section 564(b)(1) of the Act, 21 U.S.C. [id number]) (1), unless the authorization is terminated or revoked sooner. When diagnostic testing is negative, the possibility of a false negative result should be considered in the context of a patient's recent exposures and the presence of clinical signs and symptoms consistent with COVID-19. An individual without symptoms of COVID-19 and who is not shedding SARS-CoV-2 virus would  expect to have a negative (not detected) result in this assay.   Culture, blood (routine x 2)     Status: None (Preliminary result)   Collection Time: 03/13/19 12:19 AM   Specimen: Left Antecubital; Blood  Result Value Ref Range Status   Specimen Description LEFT ANTECUBITAL  Final   Special Requests   Final    BOTTLES DRAWN AEROBIC AND ANAEROBIC Blood Culture adequate volume   Culture   Final    NO GROWTH 3 DAYS Performed at [facility], [address], [place], [place] [address]    Report Status PENDING  Incomplete  Culture, blood (routine x 2)     Status: None (Preliminary result)   Collection Time:  03/13/19 12:38 AM   Specimen: BLOOD RIGHT HAND  Result Value Ref Range Status   Specimen Description BLOOD RIGHT HAND  Final   Special Requests   Final    BOTTLES DRAWN AEROBIC AND ANAEROBIC Blood Culture adequate volume   Culture   Final    NO GROWTH 3 DAYS Performed at [facility], [address], [place], [place] [address]    Report Status PENDING  Incomplete    Radiology Reports Ct Head Wo Contrast  Result Date: 03/14/2019 CLINICAL DATA:  C/o headache yesterday. No dizziness or blurry vision. No hx of stroke EXAM: CT HEAD WITHOUT CONTRAST TECHNIQUE: Contiguous axial images were obtained from the base of the skull through the vertex without intravenous contrast.  COMPARISON:  None. FINDINGS: Brain: No evidence of acute infarction, hemorrhage, hydrocephalus, extra-axial collection or mass lesion/mass effect. Vascular: No hyperdense vessel or unexpected calcification. Skull: Normal. Negative for fracture or focal lesion. Sinuses/Orbits: No acute finding. Other: None. IMPRESSION: No acute intracranial process. Electronically Signed   By: Audie Pinto M.D.   On: 03/14/2019 19:08   Ct Angio Chest Pe W Or Wo Contrast  Result Date: 03/13/2019 CLINICAL DATA:  Chest pain and shortness of breath. COVID-19 positive. EXAM: CT ANGIOGRAPHY CHEST WITH CONTRAST TECHNIQUE: Multidetector CT imaging of the chest was performed using the standard protocol during bolus administration of intravenous contrast. Multiplanar CT image reconstructions and MIPs were obtained to evaluate the vascular anatomy. CONTRAST:  160mL OMNIPAQUE IOHEXOL 350 MG/ML SOLN COMPARISON:  Radiograph yesterday. FINDINGS: Cardiovascular: Breathing motion artifact through the lung bases significantly limits assessment. Allowing for this, no obvious filling defects in the pulmonary arteries to suggest pulmonary embolus. The thoracic aorta is normal in caliber. Mild aortic atherosclerosis. No dissection. Heart is normal in size. No  pericardial effusion. Mediastinum/Nodes: No enlarged mediastinal or hilar lymph nodes. Esophagus is decompressed. No visualized thyroid nodule. Lungs/Pleura: Multifocal bilateral patchy ground-glass opacities in the basilar and peripheral predominant distribution. No confluent airspace disease. No septal thickening. Trachea and mainstem bronchi are patent. Upper Abdomen: Possible hepatic steatosis. Cholecystectomy. No acute upper abdominal findings. Musculoskeletal: There are no acute or suspicious osseous abnormalities. Review of the MIP images confirms the above findings. IMPRESSION: 1. No pulmonary embolus allowing for moderate motion artifact. 2. Multifocal patchy ground-glass opacities in a pattern consistent with COVID-19 pneumonia. Aortic Atherosclerosis (ICD10-I70.0). Electronically Signed   By: Keith Rake M.D.   On: 03/13/2019 01:42   Dg Chest Portable 1 View  Result Date: 03/12/2019 CLINICAL DATA:  Shortness of breath.  Recent COVID-19 diagnosis. EXAM: PORTABLE CHEST 1 VIEW COMPARISON:  None. FINDINGS: The heart, hila, and mediastinum are normal. Right lung is clear. There may be minimal opacity in the periphery of the left lung which is not definitive. No other abnormalities. IMPRESSION: There may be minimal opacity in the periphery of the left lung which is not definitive. No other abnormalities. Electronically Signed   By: Dorise Bullion III M.D   On: 03/12/2019 20:53      Bonnielee Haff M.D on 03/16/2019 at 12:23 PM  Between 7am to 7pm - Pager - [phone]  After 7pm go to [email or web address] - password [facility]  [facility] -  Office  [phone]

## 2019-03-16 NOTE — Plan of Care (Signed)

## 2019-03-16 NOTE — Progress Notes (Addendum)
1145: Call to daughter Colletta [place]. Updated on patient condition. Wants to know if patient will be d/c'ed. Message sent to MD.  1147: Call back to daughter Colletta [place]. Updated, patient not to d/c today.  1725: Patient walked in hallway for approximately 5 min. Sats stayed between 97-100% with a brief drop to 94%.

## 2019-03-17 LAB — CBC
HCT: 39.1 % (ref 36.0–46.0)
Hemoglobin: 13.3 g/dL (ref 12.0–15.0)
MCH: 31.3 pg (ref 26.0–34.0)
MCHC: 34 g/dL (ref 30.0–36.0)
MCV: 92 fL (ref 80.0–100.0)
Platelets: 272 10*3/uL (ref 150–400)
RBC: 4.25 MIL/uL (ref 3.87–5.11)
RDW: 13.2 % (ref 11.5–15.5)
WBC: 8.4 10*3/uL (ref 4.0–10.5)
nRBC: 0 % (ref 0.0–0.2)

## 2019-03-17 LAB — COMPREHENSIVE METABOLIC PANEL
ALT: 37 U/L (ref 0–44)
AST: 42 U/L — ABNORMAL HIGH (ref 15–41)
Albumin: 3.5 g/dL (ref 3.5–5.0)
Alkaline Phosphatase: 42 U/L (ref 38–126)
Anion gap: 8 (ref 5–15)
BUN: 22 mg/dL (ref 8–23)
CO2: 25 mmol/L (ref 22–32)
Calcium: 9 mg/dL (ref 8.9–10.3)
Chloride: 105 mmol/L (ref 98–111)
Creatinine, Ser: 0.79 mg/dL (ref 0.44–1.00)
GFR calc Af Amer: >60 mL/min (ref >60)
GFR calc non Af Amer: >60 mL/min (ref >60)
Glucose, Bld: 141 mg/dL — ABNORMAL HIGH (ref 70–99)
Potassium: 4.8 mmol/L (ref 3.5–5.1)
Sodium: 138 mmol/L (ref 135–145)
Total Bilirubin: 0.4 mg/dL (ref 0.3–1.2)
Total Protein: 6.2 g/dL — ABNORMAL LOW (ref 6.5–8.1)

## 2019-03-17 LAB — C-REACTIVE PROTEIN: CRP: 3.4 mg/dL — ABNORMAL HIGH (ref <1.0)

## 2019-03-17 MED ORDER — DEXAMETHASONE 2 MG PO TABS: ORAL_TABLET | ORAL | 0 refills | Status: DC

## 2019-03-17 NOTE — Discharge Summary (Signed)
[facility]  Physician Discharge Summary   Patient ID: Diane Watkins MRN: [id number] DOB/AGE: 01/31/1951 67 y.o.  Admit date: 03/12/2019 Discharge date: 03/17/2019  PCP: Mikey Kirschner, MD  DISCHARGE DIAGNOSES:  Pneumonia due to COVID-19, improved Essential hypertension   RECOMMENDATIONS FOR OUTPATIENT FOLLOW UP: 1. Patient instructed to follow-up with outpatient provider.    Home Health: None Equipment/Devices: None  CODE STATUS: Full code  DISCHARGE CONDITION: fair  Diet recommendation: As before  INITIAL HISTORY: 68 y.o.female,w hypertension, covid -19 + on 03/08/2019, apparently presents with c/o fever, cough, dyspnea. Pt is covid-19 positive. Pt states has had fever for the past 10 days. Pt states has had cough, w white sputum as well. Pt noted today that she became more dyspneic. Slight loose stool. Pt denies alteration in sense of taste or smell. Pt presented to ED due to dyspnea. Pt is not sure how she contracted covid-19.  Patient was transferred to [facility] for further care.    HOSPITAL COURSE:   Pneumonia due to COVID-19  Patient did have a bilateral opacities on chest x-ray and CT angiogram.  No PE was noted.  Patient was short of breath.  But she did not require oxygen during her stay.  She was started on Remdesivir and steroids with which she has improved.  She will complete course of Remdesivir today.  Inflammatory markers were noted to be elevated and have improved.  Symptomatically she is feeling much better.  She has been ambulating in the hallways without any difficulties.  Okay for discharge home today.  She will be discharged on a steroid taper.  Essential hypertension Blood pressure is reasonably well controlled.  Continue with home medications.  She is on lisinopril.  Headache Headache appears to have improved.  CT scan done at the time of admission did not show any acute findings.    Morbid obesity Estimated body mass index is  34.19 kg/m as calculated from the following:   Height as of this encounter: 5\' 3"  (1.6 m).   Weight as of this encounter: 87.5 kg.  Overall stable.  Okay for discharge home today.   PERTINENT LABS:  The results of significant diagnostics from this hospitalization (including imaging, microbiology, ancillary and laboratory) are listed below for reference.    Microbiology: Recent Results (from the past 240 hour(s))  Novel Coronavirus, NAA ([facility])     Status: Abnormal   Collection Time: 03/08/19  1:00 PM   Specimen: Nasopharyngeal(NP) swabs in vial transport medium   NASOPHARYNGE  TESTING  Result Value Ref Range Status   SARS-CoV-2, NAA Detected (A) Not Detected Final    Comment: This nucleic acid amplification test was developed and its performance characteristics determined by [employer]. Nucleic acid amplification tests include PCR and TMA. This test has not been FDA cleared or approved. This test has been authorized by FDA under an Emergency Use Authorization (EUA). This test is only authorized for the duration of time the declaration that circumstances exist justifying the authorization of the emergency use of in vitro diagnostic tests for detection of SARS-CoV-2 virus and/or diagnosis of COVID-19 infection under section 564(b)(1) of the Act, 21 U.S.C. [id number]) (1), unless the authorization is terminated or revoked sooner. When diagnostic testing is negative, the possibility of a false negative result should be considered in the context of a patient's recent exposures and the presence of clinical signs and symptoms consistent with COVID-19. An individual without symptoms of COVID-19 and who is not shedding SARS-CoV-2 virus would  expect to have a negative (not detected) result in this assay.   Culture, blood (routine x 2)     Status: None (Preliminary result)   Collection Time: 03/13/19 12:19 AM   Specimen: Left Antecubital; Blood  Result Value Ref Range  Status   Specimen Description LEFT ANTECUBITAL  Final   Special Requests   Final    BOTTLES DRAWN AEROBIC AND ANAEROBIC Blood Culture adequate volume   Culture   Final    NO GROWTH 4 DAYS Performed at [facility], [address], [place], [place] [address]    Report Status PENDING  Incomplete  Culture, blood (routine x 2)     Status: None (Preliminary result)   Collection Time: 03/13/19 12:38 AM   Specimen: BLOOD RIGHT HAND  Result Value Ref Range Status   Specimen Description BLOOD RIGHT HAND  Final   Special Requests   Final    BOTTLES DRAWN AEROBIC AND ANAEROBIC Blood Culture adequate volume   Culture   Final    NO GROWTH 4 DAYS Performed at [facility], [address] [place], [place] [address]    Report Status PENDING  Incomplete     Labs:  COVID-19 Labs  Recent Labs    03/15/19 0530 03/16/19 0046 03/17/19 0122  FERRITIN 228 229  --   CRP 3.1* 5.3* 3.4*    Lab Results  Component Value Date   SARSCOV2NAA Detected (A) 03/08/2019     Basic Metabolic Panel: Recent Labs  Lab 03/12/19 2028 03/14/19 0425 03/15/19 0530 03/16/19 0046 03/17/19 0122  NA 137 138 138 137 138  K 3.9 3.8 3.9 4.8 4.8  CL 100 99 102 103 105  CO2 25 24 24  15* 25  GLUCOSE 121* 95 133* 182* 141*  BUN 10 22 21 17 22   CREATININE 0.85 1.11* 0.97 0.91 0.79  CALCIUM 9.1 9.3 8.6* 8.9 9.0   Liver Function Tests: Recent Labs  Lab 03/12/19 2028 03/14/19 0425 03/15/19 0530 03/16/19 0046 03/17/19 0122  AST 37 69* 62* 54* 42*  ALT 27 35 33 35 37  ALKPHOS 49 41 44 44 42  BILITOT 0.6 0.6 0.6 0.4 0.4  PROT 7.5 7.0 6.2* 6.4* 6.2*  ALBUMIN 4.3 4.2 3.7 3.6 3.5   CBC: Recent Labs  Lab 03/12/19 2028 03/15/19 0530 03/17/19 0122  WBC 4.5 6.3 8.4  HGB 15.3* 13.6 13.3  HCT 44.5 40.5 39.1  MCV 91.4 91.4 92.0  PLT 203 238 272   Cardiac Enzymes: Recent Labs  Lab 03/13/19 0019  CKTOTAL 596*  CKMB 2.2   BNP: BNP (last 3 results) Recent Labs    03/13/19 0019  BNP 10.0       IMAGING STUDIES Ct Head Wo Contrast  Result Date: 03/14/2019 CLINICAL DATA:  C/o headache yesterday. No dizziness or blurry vision. No hx of stroke EXAM: CT HEAD WITHOUT CONTRAST TECHNIQUE: Contiguous axial images were obtained from the base of the skull through the vertex without intravenous contrast. COMPARISON:  None. FINDINGS: Brain: No evidence of acute infarction, hemorrhage, hydrocephalus, extra-axial collection or mass lesion/mass effect. Vascular: No hyperdense vessel or unexpected calcification. Skull: Normal. Negative for fracture or focal lesion. Sinuses/Orbits: No acute finding. Other: None. IMPRESSION: No acute intracranial process. Electronically Signed   By: Audie Pinto M.D.   On: 03/14/2019 19:08   Ct Angio Chest Pe W Or Wo Contrast  Result Date: 03/13/2019 CLINICAL DATA:  Chest pain and shortness of breath. COVID-19 positive. EXAM: CT ANGIOGRAPHY CHEST WITH CONTRAST TECHNIQUE: Multidetector CT  imaging of the chest was performed using the standard protocol during bolus administration of intravenous contrast. Multiplanar CT image reconstructions and MIPs were obtained to evaluate the vascular anatomy. CONTRAST:  122mL OMNIPAQUE IOHEXOL 350 MG/ML SOLN COMPARISON:  Radiograph yesterday. FINDINGS: Cardiovascular: Breathing motion artifact through the lung bases significantly limits assessment. Allowing for this, no obvious filling defects in the pulmonary arteries to suggest pulmonary embolus. The thoracic aorta is normal in caliber. Mild aortic atherosclerosis. No dissection. Heart is normal in size. No pericardial effusion. Mediastinum/Nodes: No enlarged mediastinal or hilar lymph nodes. Esophagus is decompressed. No visualized thyroid nodule. Lungs/Pleura: Multifocal bilateral patchy ground-glass opacities in the basilar and peripheral predominant distribution. No confluent airspace disease. No septal thickening. Trachea and mainstem bronchi are patent. Upper Abdomen: Possible  hepatic steatosis. Cholecystectomy. No acute upper abdominal findings. Musculoskeletal: There are no acute or suspicious osseous abnormalities. Review of the MIP images confirms the above findings. IMPRESSION: 1. No pulmonary embolus allowing for moderate motion artifact. 2. Multifocal patchy ground-glass opacities in a pattern consistent with COVID-19 pneumonia. Aortic Atherosclerosis (ICD10-I70.0). Electronically Signed   By: Keith Rake M.D.   On: 03/13/2019 01:42   Dg Chest Portable 1 View  Result Date: 03/12/2019 CLINICAL DATA:  Shortness of breath.  Recent COVID-19 diagnosis. EXAM: PORTABLE CHEST 1 VIEW COMPARISON:  None. FINDINGS: The heart, hila, and mediastinum are normal. Right lung is clear. There may be minimal opacity in the periphery of the left lung which is not definitive. No other abnormalities. IMPRESSION: There may be minimal opacity in the periphery of the left lung which is not definitive. No other abnormalities. Electronically Signed   By: Dorise Bullion III M.D   On: 03/12/2019 20:53    DISCHARGE EXAMINATION: Vitals:   03/16/19 1615 03/16/19 2050 03/17/19 0323 03/17/19 0745  BP: 137/81 (!) 138/94 134/84 107/68  Pulse: 79 82 70 78  Resp: 16 17 20 16   Temp: 98.7 F (37.1 C) 97.9 F (36.6 C) 98.4 F (36.9 C) 98.9 F (37.2 C)  TempSrc:  Oral Oral   SpO2: 95% 98% 96% 94%  Weight:      Height:       General appearance: Awake alert.  In no distress Resp: Clear to auscultation bilaterally.  Normal effort Cardio: S1-S2 is normal regular.  No S3-S4.  No rubs murmurs or bruit GI: Abdomen is soft.  Nontender nondistended.  Bowel sounds are present normal.  No masses organomegaly Extremities: No edema.  Full range of motion of lower extremities. Neurologic: Alert and oriented x3.  No focal neurological deficits.    DISPOSITION: Home  Discharge Instructions    Call MD for:  difficulty breathing, headache or visual disturbances   Complete by: As directed    Call MD  for:  extreme fatigue   Complete by: As directed    Call MD for:  persistant dizziness or light-headedness   Complete by: As directed    Call MD for:  persistant nausea and vomiting   Complete by: As directed    Call MD for:  temperature >100.4   Complete by: As directed    Discharge instructions   Complete by: As directed    COVID 19 INSTRUCTIONS  - You are felt to be stable enough to no longer require inpatient monitoring, testing, and treatment, though you will need to follow the recommendations below: - Based on the CDC's non-test criteria for ending self-isolation: You may not return to work/leave the home until at least 21 days since  symptom onset AND 3 days without a fever (without taking tylenol, ibuprofen, etc.) AND have improvement in respiratory symptoms. - Do not take NSAID medications (including, but not limited to, ibuprofen, advil, motrin, naproxen, aleve, goody's powder, etc.) - Follow up with your doctor in the next week via telehealth or seek medical attention right away if your symptoms get WORSE.  - Consider donating plasma after you have recovered (either 14 days after a negative test or 28 days after symptoms have completely resolved) because your antibodies to this virus may be helpful to give to others with life-threatening infections. Please go to the website www.[email or web address] if you would like to consider volunteering for plasma donation.    Directions for you at home:  Wear a facemask You should wear a facemask that covers your nose and mouth when you are in the same room with other people and when you visit a healthcare provider. People who live with or visit you should also wear a facemask while they are in the same room with you.  Separate yourself from other people in your home As much as possible, you should stay in a different room from other people in your home. Also, you should use a separate bathroom, if available.  Avoid sharing household items You  should not share dishes, drinking glasses, cups, eating utensils, towels, bedding, or other items with other people in your home. After using these items, you should wash them thoroughly with soap and water.  Cover your coughs and sneezes Cover your mouth and nose with a tissue when you cough or sneeze, or you can cough or sneeze into your sleeve. Throw used tissues in a lined trash can, and immediately wash your hands with soap and water for at least 20 seconds or use an alcohol-based hand rub.  Wash your [facility] your hands often and thoroughly with soap and water for at least 20 seconds. You can use an alcohol-based hand sanitizer if soap and water are not available and if your hands are not visibly dirty. Avoid touching your eyes, nose, and mouth with unwashed hands.  Directions for those who live with, or provide care at home for you:  Limit the number of people who have contact with the patient If possible, have only one caregiver for the patient. Other household members should stay in another home or place of residence. If this is not possible, they should stay in another room, or be separated from the patient as much as possible. Use a separate bathroom, if available. Restrict visitors who do not have an essential need to be in the home.  Ensure good ventilation Make sure that shared spaces in the home have good air flow, such as from an air conditioner or an opened window, weather permitting.  Wash your hands often Wash your hands often and thoroughly with soap and water for at least 20 seconds. You can use an alcohol based hand sanitizer if soap and water are not available and if your hands are not visibly dirty. Avoid touching your eyes, nose, and mouth with unwashed hands. Use disposable paper towels to dry your hands. If not available, use dedicated cloth towels and replace them when they become wet.  Wear a facemask and gloves Wear a disposable facemask at all times in  the room and gloves when you touch or have contact with the patients blood, body fluids, and/or secretions or excretions, such as sweat, saliva, sputum, nasal mucus, vomit, urine, or feces.  Ensure the  mask fits over your nose and mouth tightly, and do not touch it during use. Throw out disposable facemasks and gloves after using them. Do not reuse. Wash your hands immediately after removing your facemask and gloves. If your personal clothing becomes contaminated, carefully remove clothing and launder. Wash your hands after handling contaminated clothing. Place all used disposable facemasks, gloves, and other waste in a lined container before disposing them with other household waste. Remove gloves and wash your hands immediately after handling these items.  Do not share dishes, glasses, or other household items with the patient Avoid sharing household items. You should not share dishes, drinking glasses, cups, eating utensils, towels, bedding, or other items with a patient who is confirmed to have, or being evaluated for, COVID-19 infection. After the person uses these items, you should wash them thoroughly with soap and water.  Wash laundry thoroughly Immediately remove and wash clothes or bedding that have blood, body fluids, and/or secretions or excretions, such as sweat, saliva, sputum, nasal mucus, vomit, urine, or feces, on them. Wear gloves when handling laundry from the patient. Read and follow directions on labels of laundry or clothing items and detergent. In general, wash and dry with the warmest temperatures recommended on the label.  Clean all areas the individual has used often Clean all touchable surfaces, such as counters, tabletops, doorknobs, bathroom fixtures, toilets, phones, keyboards, tablets, and bedside tables, every day. Also, clean any surfaces that may have blood, body fluids, and/or secretions or excretions on them. Wear gloves when cleaning surfaces the patient has  come in contact with. Use a diluted bleach solution (e.g., dilute bleach with 1 part bleach and 10 parts water) or a household disinfectant with a label that says EPA-registered for coronaviruses. To make a bleach solution at home, add 1 tablespoon of bleach to 1 quart (4 cups) of water. For a larger supply, add  cup of bleach to 1 gallon (16 cups) of water. Read labels of cleaning products and follow recommendations provided on product labels. Labels contain instructions for safe and effective use of the cleaning product including precautions you should take when applying the product, such as wearing gloves or eye protection and making sure you have good ventilation during use of the product. Remove gloves and wash hands immediately after cleaning.  Monitor yourself for signs and symptoms of illness Caregivers and household members are considered close contacts, should monitor their health, and will be asked to limit movement outside of the home to the extent possible. Follow the monitoring steps for close contacts listed on the symptom monitoring form.   If you have additional questions, contact your local health department or call the epidemiologist on call at [phone] (available 24/7). This guidance is subject to change. For the most up-to-date guidance from [facility], please refer to their website: YouBlogs.pl   You were cared for by a hospitalist during your hospital stay. If you have any questions about your discharge medications or the care you received while you were in the hospital after you are discharged, you can call the unit and asked to speak with the hospitalist on call if the hospitalist that took care of you is not available. Once you are discharged, your primary care physician will handle any further medical issues. Please note that NO REFILLS for any discharge medications will be authorized once you are discharged, as  it is imperative that you return to your primary care physician (or establish a relationship with a primary care physician if  you do not have one) for your aftercare needs so that they can reassess your need for medications and monitor your lab values. If you do not have a primary care physician, you can call [phone] for a physician referral.   Increase activity slowly   Complete by: As directed          Allergies as of 03/17/2019      Reactions   Amoxil [amoxicillin]    headaches Has patient had a PCN reaction causing immediate rash, facial/tongue/throat swelling, SOB or lightheadedness with hypotension: No Has patient had a PCN reaction causing severe rash involving mucus membranes or skin necrosis: No Has patient had a PCN reaction that required hospitalization: No Has patient had a PCN reaction occurring within the last 10 years: No If all of the above answers are "NO", then may proceed with Cephalosporin use.      Medication List    TAKE these medications   acetaminophen 650 MG CR tablet Commonly known as: TYLENOL Take 650 mg by mouth daily as needed for pain.   aspirin 81 MG tablet Take 81 mg by mouth at bedtime.   benzonatate 100 MG capsule Commonly known as: TESSALON Take one capsule TID What changed:   how much to take  how to take this  when to take this  reasons to take this  additional instructions   dexamethasone 2 MG tablet Commonly known as: DECADRON Take 2 tablets daily for 3 days, then 1 tablet daily for 3 days, then STOP.   diphenhydrAMINE 25 MG tablet Commonly known as: BENADRYL Take 25 mg by mouth daily as needed for sleep.   ibuprofen 200 MG tablet Commonly known as: ADVIL Take 400 mg by mouth daily as needed for mild pain or moderate pain.   lisinopril 20 MG tablet Commonly known as: ZESTRIL Take 1 tablet (20 mg total) by mouth daily.   omega-3 acid ethyl esters 1 g capsule Commonly known as: LOVAZA Take 1 capsule (1 g total) by  mouth 2 (two) times daily.   Systane 0.4-0.3 % Soln Generic drug: Polyethyl Glycol-Propyl Glycol Apply 1-2 drops to eye daily as needed (FOR DRY EYE RELIEF).        Follow-up Information    Mikey Kirschner, MD. Schedule an appointment as soon as possible for a visit in 1 week(s).   Specialty: Family Medicine Contact information: [address] [place] [address] [phone]           TOTAL DISCHARGE TIME: 56 minutes  [facility] Pager on [email or web address]  03/17/2019, 11:20 AM

## 2019-03-17 NOTE — TOC Transition Note (Signed)
Transition of Care [facility]) - CM/SW Discharge Note   Patient Details  Name: Diane Watkins MRN: [id number] Date of Birth: October 09, 1950  [facility] CM/SW Contact:  Ninfa Meeker, RN Phone Number:  [phone] (working remotely) 03/17/2019, 1:08 PM   Clinical Narrative:   Case manager spoke with patient earlier via telephone. Thankfully she and her husband are improving after COVID treatment. Patient has no discharge needs at this time.     Final next level of care: Home/Self Care Barriers to Discharge: No Barriers Identified   Patient Goals and CMS Choice     Choice offered to / list presented to : NA  Discharge Placement                       Discharge Plan and Services                                     Social Determinants of Health (SDOH) Interventions     Readmission Risk Interventions No flowsheet data found.    k

## 2019-03-17 NOTE — Progress Notes (Signed)
Written and verbal discharge orders given to patient.  Patient verbalized understanding of discharge orders.  Discharged patient to home via private vehicle.

## 2019-03-17 NOTE — Discharge Instructions (Signed)

## 2019-03-17 NOTE — Plan of Care (Signed)
Pt slept well during the night. No complaints of pain verbalized. Alert and oriented. Vitals stable on RA. Minimal dyspnea on exertion and productive cough noted, prn Robitussin given. Independent with ADLs. Skin assessed, intact. IV SL. Tolerated prone positioning intermittently overnight. Ambulates the halls with no difficulty. No other issues, planning for possible discharge today. Will monitor.   Problem: Respiratory: Goal: Will maintain a patent airway Outcome: Progressing   Problem: Respiratory: Goal: Complications related to the disease process, condition or treatment will be avoided or minimized Outcome: Progressing   Problem: Coping: Goal: Psychosocial and spiritual needs will be supported Outcome: Progressing   Problem: Education: Goal: Knowledge of risk factors and measures for prevention of condition will improve Outcome: Progressing   Problem: Activity: Goal: Risk for activity intolerance will decrease Outcome: Progressing   Problem: Clinical Measurements: Goal: Respiratory complications will improve Outcome: Progressing   Problem: Clinical Measurements: Goal: Cardiovascular complication will be avoided Outcome: Progressing

## 2019-03-17 NOTE — Care Management Important Message (Signed)
Important Message  Patient Details  Name: Diane Watkins MRN: [id number] Date of Birth: 1951/05/17   Medicare Important Message Given:  Yes - Important Message mailed due to current National Emergency  Verbal consent obtained due to current National Emergency  Relationship to patient: Self Contact Name: Imari Quirindongo Call Date: 03/17/19  Time: 1422   Outcome: Spoke with contact Important Message mailed to: Patient address on file   [place] 03/17/2019, 2:22 PM

## 2019-03-18 LAB — CULTURE, BLOOD (ROUTINE X 2)
Culture: NO GROWTH
Culture: NO GROWTH
Special Requests: ADEQUATE
Special Requests: ADEQUATE
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
## 2019-04-15 ENCOUNTER — Ambulatory Visit: Payer: PPO | Admitting: Family Medicine

## 2019-07-08 ENCOUNTER — Encounter: Payer: Self-pay | Admitting: Nurse Practitioner

## 2019-07-08 ENCOUNTER — Ambulatory Visit (INDEPENDENT_AMBULATORY_CARE_PROVIDER_SITE_OTHER): Payer: PPO | Admitting: Nurse Practitioner

## 2019-07-08 ENCOUNTER — Other Ambulatory Visit: Payer: Self-pay

## 2019-07-08 VITALS — BP 132/88 | Temp 96.0°F | Wt 198.6 lb

## 2019-07-08 DIAGNOSIS — Z0000 Encounter for general adult medical examination without abnormal findings: Secondary | ICD-10-CM | POA: Diagnosis not present

## 2019-07-08 DIAGNOSIS — Z79899 Other long term (current) drug therapy: Secondary | ICD-10-CM

## 2019-07-08 DIAGNOSIS — Z23 Encounter for immunization: Secondary | ICD-10-CM

## 2019-07-08 DIAGNOSIS — E781 Pure hyperglyceridemia: Secondary | ICD-10-CM

## 2019-07-08 DIAGNOSIS — I10 Essential (primary) hypertension: Secondary | ICD-10-CM

## 2019-07-08 DIAGNOSIS — Z1231 Encounter for screening mammogram for malignant neoplasm of breast: Secondary | ICD-10-CM

## 2019-07-08 DIAGNOSIS — M8580 Other specified disorders of bone density and structure, unspecified site: Secondary | ICD-10-CM | POA: Diagnosis not present

## 2019-07-08 NOTE — Progress Notes (Signed)
Subjective:    Patient ID: Diane Watkins, female    DOB: July 11, 1950, 69 y.o.   MRN: [id number]  HPI The patient comes in today for a wellness visit.  A review of their health history was completed.  A review of medications was also completed.  Any needed refills   Eating habits: not healthy  Falls/  MVA accidents in past few months: none  Regular exercise: trying to   Specialist pt sees on regular basis: no  Preventative health issues were discussed.   Additional concerns:  Same sexual partner. No vaginal bleeding or pelvic pain. Has had Zostavax. Stopped her cholesterol medicine due to "joint pain" although this has persisted. Has been diagnosed with OA. Defers colonoscopy.   Depression screen [facility] 2/9 07/09/2019 07/18/2017  Decreased Interest 0 0  Down, Depressed, Hopeless 0 0  PHQ - 2 Score 0 0       Review of Systems  Constitutional: Negative for activity change, appetite change and fever.  HENT: Negative for sore throat and trouble swallowing.   Respiratory: Negative for cough, chest tightness, shortness of breath and wheezing.   Cardiovascular: Negative for chest pain.  Gastrointestinal: Negative for abdominal distention, abdominal pain, blood in stool, constipation, diarrhea, nausea and vomiting.  Genitourinary: Negative for difficulty urinating, dysuria, enuresis, frequency, genital sores, pelvic pain, urgency, vaginal bleeding and vaginal discharge.          Physical Exam Constitutional:      General: She is not in acute distress.    Appearance: She is well-developed.  Neck:     Thyroid: No thyromegaly.     Trachea: No tracheal deviation.  Cardiovascular:     Rate and Rhythm: Normal rate and regular rhythm.     Heart sounds: Normal heart sounds. No murmur. No gallop.   Pulmonary:     Effort: Pulmonary effort is normal.     Breath sounds: Normal breath sounds.  Chest:     Breasts:        Right: Normal. No inverted nipple, mass, skin change or  tenderness.        Left: Normal. No inverted nipple, mass, skin change or tenderness.  Abdominal:     General: There is no distension.     Palpations: Abdomen is soft.     Tenderness: There is no abdominal tenderness.  Genitourinary:    Vagina: Normal. No vaginal discharge.     Comments: External GU: normal color, no rashes or lesions. Vagina: no discharge. Cervix normal in appearance. No CMT. Bimanual exam: no tenderness or obvious masses. Exam limited due to abdominal girth.  Musculoskeletal:     Cervical back: Normal range of motion and neck supple.  Lymphadenopathy:     Cervical: No cervical adenopathy.     Upper Body:     Right upper body: No supraclavicular, axillary or pectoral adenopathy.     Left upper body: No supraclavicular, axillary or pectoral adenopathy.  Skin:    General: Skin is warm and dry.     Findings: No rash.  Neurological:     Mental Status: She is alert and oriented to person, place, and time.  Psychiatric:        Mood and Affect: Mood normal.        Behavior: Behavior normal.        Thought Content: Thought content normal.        Judgment: Judgment normal.           Assessment & Plan:  Problem List Items Addressed This Visit      Cardiovascular and Mediastinum   Essential hypertension, benign   Relevant Orders   CBC with Differential (Completed)   TSH (Completed)   COMPLETE METABOLIC PANEL WITH GFR   Lipid Profile (Completed)   Hepatitis C Antibody (Completed)     Musculoskeletal and Integument   Osteopenia   Relevant Orders   DG Bone Density     Other   Hypertriglyceridemia   Relevant Orders   CBC with Differential (Completed)   TSH (Completed)   COMPLETE METABOLIC PANEL WITH GFR   Lipid Profile (Completed)   Hepatitis C Antibody (Completed)    Other Visit Diagnoses    Routine general medical examination at a health care facility    -  Primary   Relevant Orders   CBC with Differential (Completed)   TSH (Completed)   COMPLETE  METABOLIC PANEL WITH GFR   Lipid Profile (Completed)   Hepatitis C Antibody (Completed)   Need for vaccination       Relevant Orders   Flu Vaccine QUAD 6+ mos PF IM (Fluarix Quad PF) (Completed)   Screening mammogram, encounter for       Relevant Orders   MM 3D SCREEN BREAST BILATERAL   High risk medication use       Relevant Orders   CBC with Differential (Completed)   TSH (Completed)   COMPLETE METABOLIC PANEL WITH GFR   Lipid Profile (Completed)   Hepatitis C Antibody (Completed)     Recommend healthy diet, regular walking program and weight loss.  Discussed pneumonia vaccine and colonoscopy but she defers at this time.  Labs ordered. Further follow up based on results.  Return in about 6 months (around 01/05/2020).

## 2019-07-09 ENCOUNTER — Encounter: Payer: Self-pay | Admitting: Nurse Practitioner

## 2019-07-09 DIAGNOSIS — E785 Hyperlipidemia, unspecified: Secondary | ICD-10-CM | POA: Insufficient documentation

## 2019-07-09 LAB — CBC WITH DIFFERENTIAL/PLATELET
Basophils Absolute: 0 10*3/uL (ref 0.0–0.2)
Basos: 1 %
EOS (ABSOLUTE): 0.2 10*3/uL (ref 0.0–0.4)
Eos: 2 %
Hematocrit: 43.3 % (ref 34.0–46.6)
Hemoglobin: 14.8 g/dL (ref 11.1–15.9)
Immature Grans (Abs): 0 10*3/uL (ref 0.0–0.1)
Immature Granulocytes: 0 %
Lymphocytes Absolute: 1.9 10*3/uL (ref 0.7–3.1)
Lymphs: 27 %
MCH: 31.6 pg (ref 26.6–33.0)
MCHC: 34.2 g/dL (ref 31.5–35.7)
MCV: 92 fL (ref 79–97)
Monocytes Absolute: 0.5 10*3/uL (ref 0.1–0.9)
Monocytes: 7 %
Neutrophils Absolute: 4.5 10*3/uL (ref 1.4–7.0)
Neutrophils: 63 %
Platelets: 305 10*3/uL (ref 150–450)
RBC: 4.69 x10E6/uL (ref 3.77–5.28)
RDW: 13.3 % (ref 11.7–15.4)
WBC: 7 10*3/uL (ref 3.4–10.8)

## 2019-07-09 LAB — COMPREHENSIVE METABOLIC PANEL
ALT: 23 IU/L (ref 0–32)
AST: 28 IU/L (ref 0–40)
Albumin/Globulin Ratio: 2 (ref 1.2–2.2)
Albumin: 4.7 g/dL (ref 3.8–4.8)
Alkaline Phosphatase: 60 IU/L (ref 39–117)
BUN/Creatinine Ratio: 17 (ref 12–28)
BUN: 17 mg/dL (ref 8–27)
Bilirubin Total: 0.3 mg/dL (ref 0.0–1.2)
CO2: 25 mmol/L (ref 20–29)
Calcium: 10.3 mg/dL (ref 8.7–10.3)
Chloride: 103 mmol/L (ref 96–106)
Creatinine, Ser: 1.02 mg/dL — ABNORMAL HIGH (ref 0.57–1.00)
GFR calc Af Amer: 66 mL/min/{1.73_m2} (ref >59)
GFR calc non Af Amer: 57 mL/min/{1.73_m2} — ABNORMAL LOW (ref >59)
Globulin, Total: 2.4 g/dL (ref 1.5–4.5)
Glucose: 106 mg/dL — ABNORMAL HIGH (ref 65–99)
Potassium: 5.1 mmol/L (ref 3.5–5.2)
Sodium: 143 mmol/L (ref 134–144)
Total Protein: 7.1 g/dL (ref 6.0–8.5)

## 2019-07-09 LAB — LIPID PANEL
Chol/HDL Ratio: 8.8 ratio — ABNORMAL HIGH (ref 0.0–4.4)
Cholesterol, Total: 377 mg/dL — ABNORMAL HIGH (ref 100–199)
HDL: 43 mg/dL (ref >39)
LDL Chol Calc (NIH): 263 mg/dL — ABNORMAL HIGH (ref 0–99)
Triglycerides: 319 mg/dL — ABNORMAL HIGH (ref 0–149)
VLDL Cholesterol Cal: 71 mg/dL — ABNORMAL HIGH (ref 5–40)

## 2019-07-09 LAB — TSH: TSH: 2.83 u[IU]/mL (ref 0.450–4.500)

## 2019-07-09 LAB — SPECIMEN STATUS REPORT

## 2019-07-09 LAB — HEPATITIS C ANTIBODY: Hep C Virus Ab: <0.1 s/co ratio (ref 0.0–0.9)

## 2019-07-15 ENCOUNTER — Other Ambulatory Visit: Payer: Self-pay | Admitting: Nurse Practitioner

## 2019-07-15 ENCOUNTER — Other Ambulatory Visit: Payer: Self-pay | Admitting: *Deleted

## 2019-07-15 DIAGNOSIS — E785 Hyperlipidemia, unspecified: Secondary | ICD-10-CM

## 2019-07-15 DIAGNOSIS — Z79899 Other long term (current) drug therapy: Secondary | ICD-10-CM

## 2019-07-15 DIAGNOSIS — E781 Pure hyperglyceridemia: Secondary | ICD-10-CM

## 2019-07-15 MED ORDER — ATORVASTATIN CALCIUM 20 MG PO TABS: 20.0000 mg | ORAL_TABLET | Freq: Every day | ORAL | 2 refills | Status: DC

## 2019-07-21 ENCOUNTER — Encounter: Payer: Self-pay | Admitting: Family Medicine

## 2019-08-08 ENCOUNTER — Ambulatory Visit (HOSPITAL_COMMUNITY)
Admission: RE | Admit: 2019-08-08 | Discharge: 2019-08-08 | Disposition: A | Discharge status: Home / Self Care | Payer: PPO | Source: Ambulatory Visit | Attending: Nurse Practitioner | Admitting: Nurse Practitioner

## 2019-08-08 ENCOUNTER — Other Ambulatory Visit: Payer: Self-pay

## 2019-08-08 DIAGNOSIS — M1990 Unspecified osteoarthritis, unspecified site: Secondary | ICD-10-CM | POA: Insufficient documentation

## 2019-08-08 DIAGNOSIS — Z780 Asymptomatic menopausal state: Secondary | ICD-10-CM | POA: Insufficient documentation

## 2019-08-08 DIAGNOSIS — M85852 Other specified disorders of bone density and structure, left thigh: Secondary | ICD-10-CM | POA: Insufficient documentation

## 2019-08-08 DIAGNOSIS — M8580 Other specified disorders of bone density and structure, unspecified site: Secondary | ICD-10-CM | POA: Diagnosis present

## 2019-08-16 ENCOUNTER — Other Ambulatory Visit: Payer: Self-pay

## 2019-08-16 ENCOUNTER — Ambulatory Visit
Admission: RE | Admit: 2019-08-16 | Discharge: 2019-08-16 | Disposition: A | Discharge status: Home / Self Care | Payer: PPO | Source: Ambulatory Visit | Attending: Nurse Practitioner | Admitting: Nurse Practitioner

## 2019-08-16 DIAGNOSIS — Z1231 Encounter for screening mammogram for malignant neoplasm of breast: Secondary | ICD-10-CM | POA: Diagnosis not present

## 2019-08-19 ENCOUNTER — Other Ambulatory Visit: Payer: Self-pay | Admitting: Nurse Practitioner

## 2019-08-19 DIAGNOSIS — R928 Other abnormal and inconclusive findings on diagnostic imaging of breast: Secondary | ICD-10-CM

## 2019-08-22 ENCOUNTER — Other Ambulatory Visit: Payer: Self-pay | Admitting: Family Medicine

## 2019-09-01 ENCOUNTER — Ambulatory Visit: Payer: PPO

## 2019-09-01 ENCOUNTER — Ambulatory Visit
Admission: RE | Admit: 2019-09-01 | Discharge: 2019-09-01 | Disposition: A | Discharge status: Home / Self Care | Payer: PPO | Source: Ambulatory Visit | Attending: Nurse Practitioner | Admitting: Nurse Practitioner

## 2019-09-01 ENCOUNTER — Other Ambulatory Visit: Payer: Self-pay

## 2019-09-01 DIAGNOSIS — R928 Other abnormal and inconclusive findings on diagnostic imaging of breast: Secondary | ICD-10-CM

## 2019-09-01 DIAGNOSIS — R922 Inconclusive mammogram: Secondary | ICD-10-CM | POA: Diagnosis not present

## 2019-09-19 ENCOUNTER — Telehealth: Payer: Self-pay | Admitting: Family Medicine

## 2019-09-19 NOTE — Telephone Encounter (Signed)
Patient is requesting to switch back to Crestor 20 mg and not Lipitor 20 mg stating its too strong. Last seen 07/08/2019 [facility]

## 2019-09-19 NOTE — Telephone Encounter (Signed)
Please advise. Thank you

## 2019-09-23 ENCOUNTER — Other Ambulatory Visit: Payer: Self-pay | Admitting: Nurse Practitioner

## 2019-09-23 MED ORDER — ROSUVASTATIN CALCIUM 20 MG PO TABS: 20.0000 mg | ORAL_TABLET | Freq: Every day | ORAL | 2 refills | Status: DC

## 2019-09-23 NOTE — Telephone Encounter (Signed)
Left message to return call 

## 2019-09-23 NOTE — Telephone Encounter (Signed)
Medication switched. Please ask her to get her labs done around mid May. Orders are in the system already. If any problems, let us know. Thanks.

## 2019-09-26 NOTE — Telephone Encounter (Signed)
Patient advised Diane Watkins states the medication was switched as requested.  Please ask her to get her labs done around mid May. Orders are in the system already. If any problems, let us know. Patient verbalized understanding.

## 2019-10-17 ENCOUNTER — Other Ambulatory Visit: Payer: Self-pay | Admitting: Family Medicine

## 2019-10-27 DIAGNOSIS — Z79899 Other long term (current) drug therapy: Secondary | ICD-10-CM | POA: Diagnosis not present

## 2019-10-27 DIAGNOSIS — E781 Pure hyperglyceridemia: Secondary | ICD-10-CM | POA: Diagnosis not present

## 2019-10-27 DIAGNOSIS — E785 Hyperlipidemia, unspecified: Secondary | ICD-10-CM | POA: Diagnosis not present

## 2019-10-28 LAB — HEPATIC FUNCTION PANEL
ALT: 24 IU/L (ref 0–32)
AST: 24 IU/L (ref 0–40)
Albumin: 5.2 g/dL — ABNORMAL HIGH (ref 3.8–4.8)
Alkaline Phosphatase: 60 IU/L (ref 39–117)
Bilirubin Total: 0.4 mg/dL (ref 0.0–1.2)
Bilirubin, Direct: 0.12 mg/dL (ref 0.00–0.40)
Total Protein: 6.9 g/dL (ref 6.0–8.5)

## 2019-10-28 LAB — LIPID PANEL
Chol/HDL Ratio: 4.9 ratio — ABNORMAL HIGH (ref 0.0–4.4)
Cholesterol, Total: 212 mg/dL — ABNORMAL HIGH (ref 100–199)
HDL: 43 mg/dL
LDL Chol Calc (NIH): 106 mg/dL — ABNORMAL HIGH (ref 0–99)
Triglycerides: 368 mg/dL — ABNORMAL HIGH (ref 0–149)
VLDL Cholesterol Cal: 63 mg/dL — ABNORMAL HIGH (ref 5–40)

## 2019-10-31 ENCOUNTER — Other Ambulatory Visit: Payer: Self-pay | Admitting: Nurse Practitioner

## 2019-10-31 DIAGNOSIS — E785 Hyperlipidemia, unspecified: Secondary | ICD-10-CM

## 2019-11-27 ENCOUNTER — Other Ambulatory Visit: Payer: Self-pay | Admitting: Family Medicine

## 2019-12-16 ENCOUNTER — Other Ambulatory Visit: Payer: Self-pay | Admitting: Family Medicine

## 2019-12-16 ENCOUNTER — Telehealth: Payer: Self-pay | Admitting: Family Medicine

## 2019-12-16 ENCOUNTER — Other Ambulatory Visit: Payer: Self-pay | Admitting: Nurse Practitioner

## 2019-12-16 NOTE — Telephone Encounter (Signed)
Last labs completed 10/27/19 Hepatic and Lipid. Please advise. Thank you

## 2019-12-16 NOTE — Telephone Encounter (Signed)
Pt has called and needs refill on all her meds

## 2019-12-16 NOTE — Telephone Encounter (Signed)
Pt has follow up on July 22nd and would like to know if lab work needs to be ordered.

## 2019-12-16 NOTE — Telephone Encounter (Signed)
Pt.notified

## 2019-12-16 NOTE — Telephone Encounter (Signed)
Is due for repeat lipid in August. Recommend A1C here in the office during her visit. No other labs at this time. Please make sure she is taking her Omega 3 supplement; try to take 2 pills twice a day. Thanks.

## 2019-12-20 ENCOUNTER — Telehealth: Payer: Self-pay | Admitting: Family Medicine

## 2019-12-20 NOTE — Telephone Encounter (Signed)
Patient states her lovaza was increased to 2 tabs twice a day in May with her blood work results but it was sent in as once twice a day and she has run out early and needs new script sent in for correct amount and instructions- [facility] [address]

## 2019-12-20 NOTE — Telephone Encounter (Signed)
Pt requested refill last week with [facility] Sauer never called in Pt runs out of meds in two days omega-3 acid ethyl esters (LOVAZA) 1 g capsule

## 2019-12-22 MED ORDER — OMEGA-3-ACID ETHYL ESTERS 1 G PO CAPS: ORAL_CAPSULE | ORAL | 1 refills | Status: DC

## 2019-12-22 NOTE — Telephone Encounter (Signed)
Pt is out of medication and is checking on status of refill.

## 2019-12-22 NOTE — Telephone Encounter (Signed)
Medication sent in and pt is aware  

## 2019-12-22 NOTE — Telephone Encounter (Signed)
Please send a new prescription for two capsules twice daily patient may have 30-day with 5 refills or 90-day with 1 refill her preference

## 2020-01-05 ENCOUNTER — Other Ambulatory Visit: Payer: Self-pay

## 2020-01-05 ENCOUNTER — Ambulatory Visit (INDEPENDENT_AMBULATORY_CARE_PROVIDER_SITE_OTHER): Payer: PPO | Admitting: Nurse Practitioner

## 2020-01-05 ENCOUNTER — Encounter: Payer: Self-pay | Admitting: Nurse Practitioner

## 2020-01-05 VITALS — BP 142/82 | Temp 96.4°F | Wt 197.4 lb

## 2020-01-05 DIAGNOSIS — E785 Hyperlipidemia, unspecified: Secondary | ICD-10-CM

## 2020-01-05 DIAGNOSIS — I10 Essential (primary) hypertension: Secondary | ICD-10-CM | POA: Diagnosis not present

## 2020-01-05 DIAGNOSIS — E781 Pure hyperglyceridemia: Secondary | ICD-10-CM | POA: Diagnosis not present

## 2020-01-05 NOTE — Progress Notes (Signed)
Subjective:    Patient ID: Diane Watkins, female    DOB: [birth year], 69 y.o.   MRN: [id number]  HPI Pt here today for 6 month follow up. Pt states she is doing OK except for knees bothering her. Pt has been taking Ibuprofen for knee pain at times. No questions or concerns.  Has been on Lovaza for about a month for TG.   Review of Systems  Constitutional: Negative for fatigue and fever.  Eyes: Negative for visual disturbance.  Respiratory: Negative for cough and shortness of breath.   Cardiovascular: Positive for leg swelling. Negative for chest pain and palpitations.  Gastrointestinal: Negative for abdominal pain.  Musculoskeletal:       Bilateral knee pain due to osteoarthritis.    Neurological: Negative for speech difficulty, weakness and numbness.       Objective:   Physical Exam Vitals reviewed.  Constitutional:      Appearance: She is obese.  Neck:     Vascular: No carotid bruit.  Cardiovascular:     Rate and Rhythm: Normal rate and regular rhythm.     Pulses: Normal pulses.     Heart sounds: Normal heart sounds. No murmur heard.  No friction rub. No gallop.   Pulmonary:     Effort: No respiratory distress.     Breath sounds: Normal breath sounds. No stridor. No wheezing, rhonchi or rales.  Abdominal:     Palpations: Abdomen is soft. There is no mass.     Tenderness: There is no abdominal tenderness. There is no guarding.  Musculoskeletal:     Left lower leg: Edema present.  Neurological:     Mental Status: She is alert.   Lt lower leg: 1+ pitting edema; Rt lower leg: trace pitting edema. Venous stasis color changes noted anterior lower legs bilat.  Today's Vitals   01/05/20 1026  BP: (!) 142/82  Temp: (!) 96.4 F (35.8 C)  Weight: 89.5 kg   Body mass index is 34.97 kg/m.; Recent Results (from the past 2160 hour(s))  Lipid panel     Status: Abnormal   Collection Time: 10/27/19 11:55 AM  Result Value Ref Range   Cholesterol, Total 212 (H) 100 -  199 mg/dL   Triglycerides 368 (H) 0 - 149 mg/dL   HDL 43 >39 mg/dL   VLDL Cholesterol Cal 63 (H) 5 - 40 mg/dL   LDL Chol Calc (NIH) 106 (H) 0 - 99 mg/dL   Chol/HDL Ratio 4.9 (H) 0.0 - 4.4 ratio    Comment:                                   T. Chol/HDL Ratio                                             Men  Women                               1/2 Avg.Risk  3.4    3.3                                   Avg.Risk  5.0    4.4  2X Avg.Risk  9.6    7.1                                3X Avg.Risk 23.4   11.0   Hepatic function panel     Status: Abnormal   Collection Time: 10/27/19 11:55 AM  Result Value Ref Range   Total Protein 6.9 6.0 - 8.5 g/dL   Albumin 5.2 (H) 3.8 - 4.8 g/dL   Bilirubin Total 0.4 0.0 - 1.2 mg/dL   Bilirubin, Direct 0.12 0.00 - 0.40 mg/dL   Alkaline Phosphatase 60 39 - 117 IU/L   AST 24 0 - 40 IU/L   ALT 24 0 - 32 IU/L        Assessment & Plan:   Problem List Items Addressed This Visit      Cardiovascular and Mediastinum   Essential hypertension, benign - Primary   Relevant Medications   hydrochlorothiazide (HYDRODIURIL) 25 MG tablet     Other   Hyperlipidemia   Relevant Medications   hydrochlorothiazide (HYDRODIURIL) 25 MG tablet   Hypertriglyceridemia   Relevant Medications   hydrochlorothiazide (HYDRODIURIL) 25 MG tablet      Recheck lipid profile in another month (2 months after starting Lovaza).  Increase walking.  Discussed weight loss benefits for knee pain.  Use ice/heat, biofreeze.or lidocaine for knee pain.    Return in about 6 months (around 07/07/2020) for yearly exam . Call back sooner if needed.

## 2020-01-06 ENCOUNTER — Telehealth: Payer: Self-pay | Admitting: Family Medicine

## 2020-01-06 MED ORDER — HYDROCHLOROTHIAZIDE 25 MG PO TABS: 25.0000 mg | ORAL_TABLET | Freq: Every day | ORAL | 2 refills | Status: DC

## 2020-01-06 NOTE — Telephone Encounter (Signed)
Done

## 2020-01-06 NOTE — Telephone Encounter (Signed)
Pt said she was in her yesterday and HCTZ Hydrochlorizide 25 mg was going to be sent to [facility] on [facility] and nothing has been sent. Pt said this is a new med that the Dr was going to put her on.   Pt Call back number [phone]

## 2020-01-08 ENCOUNTER — Encounter: Payer: Self-pay | Admitting: Nurse Practitioner

## 2020-01-08 NOTE — Progress Notes (Signed)
   Subjective:    Patient ID: Diane Watkins, female    DOB: October 06, 1950, 69 y.o.   MRN: [id number]  HPI  See other note.   Review of Systems     Objective:   Physical Exam        Assessment & Plan:

## 2020-03-12 ENCOUNTER — Other Ambulatory Visit: Payer: Self-pay | Admitting: Nurse Practitioner

## 2020-03-27 ENCOUNTER — Other Ambulatory Visit: Payer: Self-pay | Admitting: Family Medicine

## 2020-03-28 NOTE — Telephone Encounter (Signed)
Pt was supposed to get repeat lipid panel in sept.  pls have pt go to see if the lovaza helped with the TGs. Fasting lipids already in.  Is she out of cholesterol medication, if so can give her some till her labs.   Thx.   Dr. Lovena Le

## 2020-03-28 NOTE — Telephone Encounter (Signed)
Pt was supposed to get repeat lipid panel in sept.  pls have pt go to see if the lovaza helped with the TGs. Fasting lipids already in.   Is she out of cholesterol medication, if so can give her some till her labs.    Thx.    Dr. Lovena Le

## 2020-03-29 NOTE — Telephone Encounter (Signed)
Lmtc

## 2020-03-30 DIAGNOSIS — E785 Hyperlipidemia, unspecified: Secondary | ICD-10-CM | POA: Diagnosis not present

## 2020-03-30 MED ORDER — OMEGA-3-ACID ETHYL ESTERS 1 G PO CAPS: ORAL_CAPSULE | ORAL | 0 refills | Status: DC

## 2020-03-30 MED ORDER — OMEGA-3-ACID ETHYL ESTERS 1 G PO CAPS
ORAL_CAPSULE | ORAL | 0 refills | Status: DC
Start: 2020-03-30 — End: 2020-06-22

## 2020-03-30 NOTE — Telephone Encounter (Signed)
Pt returned call. Pt states she can go this morning to have her lab work done. Pt would like refill of Lovaza due to being out. Please advise. Thank you

## 2020-03-30 NOTE — Addendum Note (Signed)
Addended by: Dairl Ponder on: 03/30/2020 04:37 PM   Modules accepted: Orders

## 2020-03-30 NOTE — Addendum Note (Signed)
Addended by: Erven Colla on: 03/30/2020 11:51 AM   Modules accepted: Orders

## 2020-03-31 LAB — LIPID PANEL
Chol/HDL Ratio: 4.1 ratio (ref 0.0–4.4)
Cholesterol, Total: 206 mg/dL — ABNORMAL HIGH (ref 100–199)
HDL: 50 mg/dL (ref >39)
LDL Chol Calc (NIH): 118 mg/dL — ABNORMAL HIGH (ref 0–99)
Triglycerides: 220 mg/dL — ABNORMAL HIGH (ref 0–149)
VLDL Cholesterol Cal: 38 mg/dL (ref 5–40)

## 2020-06-21 ENCOUNTER — Telehealth: Payer: Self-pay

## 2020-06-21 ENCOUNTER — Other Ambulatory Visit: Payer: Self-pay | Admitting: Family Medicine

## 2020-06-21 DIAGNOSIS — I10 Essential (primary) hypertension: Secondary | ICD-10-CM

## 2020-06-21 DIAGNOSIS — Z79899 Other long term (current) drug therapy: Secondary | ICD-10-CM

## 2020-06-21 NOTE — Telephone Encounter (Signed)
Pt had lipid and liver in May and lipid in Oct.

## 2020-06-21 NOTE — Telephone Encounter (Signed)
Pt schedule Med Check appt with Eber Jones needs blood work ordered :?  Pt call back [phone]

## 2020-06-22 NOTE — Telephone Encounter (Signed)
Just needs CMP and CBC. Other labs are up to date. Thanks.

## 2020-06-25 NOTE — Telephone Encounter (Signed)
Bw orders put in and pt was notified.

## 2020-07-06 ENCOUNTER — Ambulatory Visit: Payer: PPO | Admitting: Nurse Practitioner

## 2020-07-24 DIAGNOSIS — I10 Essential (primary) hypertension: Secondary | ICD-10-CM | POA: Diagnosis not present

## 2020-07-24 DIAGNOSIS — Z79899 Other long term (current) drug therapy: Secondary | ICD-10-CM | POA: Diagnosis not present

## 2020-07-25 LAB — CBC WITH DIFFERENTIAL/PLATELET
Basophils Absolute: 0 10*3/uL (ref 0.0–0.2)
Basos: 1 %
EOS (ABSOLUTE): 0.1 10*3/uL (ref 0.0–0.4)
Eos: 2 %
Hematocrit: 43 % (ref 34.0–46.6)
Hemoglobin: 14.7 g/dL (ref 11.1–15.9)
Immature Grans (Abs): 0 10*3/uL (ref 0.0–0.1)
Immature Granulocytes: 0 %
Lymphocytes Absolute: 2.9 10*3/uL (ref 0.7–3.1)
Lymphs: 36 %
MCH: 31.5 pg (ref 26.6–33.0)
MCHC: 34.2 g/dL (ref 31.5–35.7)
MCV: 92 fL (ref 79–97)
Monocytes Absolute: 0.5 10*3/uL (ref 0.1–0.9)
Monocytes: 7 %
Neutrophils Absolute: 4.3 10*3/uL (ref 1.4–7.0)
Neutrophils: 54 %
Platelets: 269 10*3/uL (ref 150–450)
RBC: 4.67 x10E6/uL (ref 3.77–5.28)
RDW: 12.9 % (ref 11.7–15.4)
WBC: 7.9 10*3/uL (ref 3.4–10.8)

## 2020-07-25 LAB — COMPREHENSIVE METABOLIC PANEL
ALT: 22 IU/L (ref 0–32)
AST: 21 IU/L (ref 0–40)
Albumin/Globulin Ratio: 2.1 (ref 1.2–2.2)
Albumin: 4.6 g/dL (ref 3.8–4.8)
Alkaline Phosphatase: 58 IU/L (ref 44–121)
BUN/Creatinine Ratio: 21 (ref 12–28)
BUN: 19 mg/dL (ref 8–27)
Bilirubin Total: 0.3 mg/dL (ref 0.0–1.2)
CO2: 22 mmol/L (ref 20–29)
Calcium: 10.2 mg/dL (ref 8.7–10.3)
Chloride: 103 mmol/L (ref 96–106)
Creatinine, Ser: 0.9 mg/dL (ref 0.57–1.00)
GFR calc Af Amer: 77 mL/min/{1.73_m2} (ref >59)
GFR calc non Af Amer: 66 mL/min/{1.73_m2} (ref >59)
Globulin, Total: 2.2 g/dL (ref 1.5–4.5)
Glucose: 113 mg/dL — ABNORMAL HIGH (ref 65–99)
Potassium: 4.9 mmol/L (ref 3.5–5.2)
Sodium: 143 mmol/L (ref 134–144)
Total Protein: 6.8 g/dL (ref 6.0–8.5)

## 2020-07-27 ENCOUNTER — Other Ambulatory Visit: Payer: Self-pay

## 2020-07-27 ENCOUNTER — Ambulatory Visit (INDEPENDENT_AMBULATORY_CARE_PROVIDER_SITE_OTHER): Payer: PPO | Admitting: Nurse Practitioner

## 2020-07-27 ENCOUNTER — Encounter: Payer: Self-pay | Admitting: Nurse Practitioner

## 2020-07-27 VITALS — BP 126/82 | HR 89 | Temp 97.6°F | Ht 62.0 in | Wt 201.0 lb

## 2020-07-27 DIAGNOSIS — M170 Bilateral primary osteoarthritis of knee: Secondary | ICD-10-CM | POA: Diagnosis not present

## 2020-07-27 DIAGNOSIS — I10 Essential (primary) hypertension: Secondary | ICD-10-CM | POA: Diagnosis not present

## 2020-07-27 DIAGNOSIS — F419 Anxiety disorder, unspecified: Secondary | ICD-10-CM | POA: Diagnosis not present

## 2020-07-27 MED ORDER — ESCITALOPRAM OXALATE 10 MG PO TABS: 10.0000 mg | ORAL_TABLET | Freq: Every day | ORAL | 0 refills | Status: DC

## 2020-07-27 MED ORDER — NAPROXEN 375 MG PO TABS: 375.0000 mg | ORAL_TABLET | Freq: Two times a day (BID) | ORAL | 0 refills | Status: DC

## 2020-07-27 NOTE — Progress Notes (Signed)
   Subjective:    Patient ID: Diane Watkins, female    DOB: Dec 30, 1950, 70 y.o.   MRN: [id number]  Hypertension This is a chronic problem. The current episode started more than 1 year ago. Risk factors for coronary artery disease include dyslipidemia. Treatments tried: zesteril, hctz. There are no compliance problems.       Review of Systems     Objective:   Physical Exam        Assessment & Plan:

## 2020-07-27 NOTE — Progress Notes (Signed)
Subjective:    Patient ID: Diane Watkins, female    DOB: Oct 15, 1950, 70 y.o.   MRN: [id number]  HPI  Pt here for a lab work follow up. She is concerned about arthritic pain in hips and knees. Has been getting worse over time. Unable to walk for more than 15 mins without persistent pain. Had to cut back on exercising.  Knees are often swollen when in pain. Takes ibuprofen and tylenol to alleviate it. Mainly in both hips and knees. No erythema or warmth. Swelling of the knees at times.   Pt has also been grieving the passing of mother and feeling overwhelmed by many life stressors. States she has been feeling more sad than usual, stays up late watching tv to occupy her mind. Not coping as well as she usually does. Has a good support system including her sister.   Health promotion: Pt is not a smoker. Do not consume alcohol. Do not exercise regularly. Not sexually active. Due for an eye exam and dental care. Declined COVID vaccine.    Review of Systems  Constitutional: Positive for activity change and fatigue. Negative for appetite change, chills, diaphoresis, fever and unexpected weight change.       Less activity to reduce arthritic pain   Respiratory: Negative for cough, chest tightness, shortness of breath and wheezing.   Cardiovascular: Negative for chest pain and leg swelling.  Gastrointestinal: Negative for abdominal pain and constipation.  Genitourinary: Negative for difficulty urinating.  Musculoskeletal: Positive for arthralgias, back pain and joint swelling.       Bilateral hips and knees. Better in the morning and worsen has the day goes on  Psychiatric/Behavioral:       Stressed and anxious  Patient denies chest pain/ischemic type pain or SOB. Denies visual changes, difficulty speaking or swallowing or numbness or weakness of the face, arms or legs.   Depression screen [facility] 2/9 07/27/2020 07/27/2020 07/09/2019 07/18/2017  Decreased Interest 0 0 0 0  Down, Depressed,  Hopeless 0 0 0 0  PHQ - 2 Score 0 0 0 0  Altered sleeping 2 - - -  Tired, decreased energy 0 - - -  Change in appetite 0 - - -  Feeling bad or failure about yourself  0 - - -  Trouble concentrating 0 - - -  Moving slowly or fidgety/restless 0 - - -  Suicidal thoughts 0 - - -  PHQ-9 Score 2 - - -  Difficult doing work/chores Somewhat difficult - - -   GAD 7 : Generalized Anxiety Score 07/27/2020  Nervous, Anxious, on Edge 2  Control/stop worrying 2  Worry too much - different things 2  Trouble relaxing 0  Restless 0  Easily annoyed or irritable 1  Afraid - awful might happen 1  Total GAD 7 Score 8  Anxiety Difficulty Somewhat difficult   Denies suicidal or homicidal thoughts or ideation.       Objective:   Physical Exam Constitutional:      General: She is not in acute distress.    Appearance: Normal appearance. She is not ill-appearing, toxic-appearing or diaphoretic.  Neck:     Vascular: No carotid bruit.  Cardiovascular:     Rate and Rhythm: Normal rate and regular rhythm.     Heart sounds: Normal heart sounds. No murmur heard. No gallop.      Comments: Normal heart tones, S1 & S2 Pulmonary:     Effort: Pulmonary effort is normal. No respiratory distress.  Breath sounds: Normal breath sounds.     Comments: Clear and equal in upper and lower lobes.  Musculoskeletal:     Right lower leg: No edema.     Left lower leg: No edema.     Comments:  Heberden's and bouchard's nodes present on all fingers;  bone density study showed pre osteopenia. Knees minimal edema; no crepitus or joint laxity. Has difficulty getting on and off the exam table.   Lymphadenopathy:     Cervical: No cervical adenopathy.  Neurological:     Mental Status: She is alert and oriented to person, place, and time. Mental status is at baseline.  Psychiatric:        Behavior: Behavior normal.        Thought Content: Thought content normal.        Judgment: Judgment normal.     Comments: Tearful  and crying during most of the visit. Making good eye contact. Dressed appropriately.     Today's Vitals   07/27/20 1340  BP: 126/82  Pulse: 89  Temp: 97.6 F (36.4 C)  TempSrc: Oral  SpO2: 98%  Weight: 201 lb (91.2 kg)  Height: 5\' 2"  (1.575 m)   Body mass index is 36.76 kg/m.;  Results for orders placed or performed in visit on 06/21/20  CBC with Differential/Platelet  Result Value Ref Range   WBC 7.9 3.4 - 10.8 x10E3/uL   RBC 4.67 3.77 - 5.28 x10E6/uL   Hemoglobin 14.7 11.1 - 15.9 g/dL   Hematocrit 43.0 34.0 - 46.6 %   MCV 92 79 - 97 fL   MCH 31.5 26.6 - 33.0 pg   MCHC 34.2 31.5 - 35.7 g/dL   RDW 12.9 11.7 - 15.4 %   Platelets 269 150 - 450 x10E3/uL   Neutrophils 54 Not Estab. %   Lymphs 36 Not Estab. %   Monocytes 7 Not Estab. %   Eos 2 Not Estab. %   Basos 1 Not Estab. %   Neutrophils Absolute 4.3 1.4 - 7.0 x10E3/uL   Lymphocytes Absolute 2.9 0.7 - 3.1 x10E3/uL   Monocytes Absolute 0.5 0.1 - 0.9 x10E3/uL   EOS (ABSOLUTE) 0.1 0.0 - 0.4 x10E3/uL   Basophils Absolute 0.0 0.0 - 0.2 x10E3/uL   Immature Granulocytes 0 Not Estab. %   Immature Grans (Abs) 0.0 0.0 - 0.1 x10E3/uL  Comprehensive metabolic panel  Result Value Ref Range   Glucose 113 (H) 65 - 99 mg/dL   BUN 19 8 - 27 mg/dL   Creatinine, Ser 0.90 0.57 - 1.00 mg/dL   GFR calc non Af Amer 66 >59 mL/min/1.73   GFR calc Af Amer 77 >59 mL/min/1.73   BUN/Creatinine Ratio 21 12 - 28   Sodium 143 134 - 144 mmol/L   Potassium 4.9 3.5 - 5.2 mmol/L   Chloride 103 96 - 106 mmol/L   CO2 22 20 - 29 mmol/L   Calcium 10.2 8.7 - 10.3 mg/dL   Total Protein 6.8 6.0 - 8.5 g/dL   Albumin 4.6 3.8 - 4.8 g/dL   Globulin, Total 2.2 1.5 - 4.5 g/dL   Albumin/Globulin Ratio 2.1 1.2 - 2.2   Bilirubin Total 0.3 0.0 - 1.2 mg/dL   Alkaline Phosphatase 58 44 - 121 IU/L   AST 21 0 - 40 IU/L   ALT 22 0 - 32 IU/L   Reviewed labs with patient.      Assessment & Plan:   Problem List Items Addressed This Visit      Cardiovascular  and Mediastinum   Essential hypertension, benign     Musculoskeletal and Integument   Primary osteoarthritis of both knees - Primary   Relevant Medications   naproxen (NAPROSYN) 375 MG tablet   Other Relevant Orders   Ambulatory referral to Physical Therapy    Other Visit Diagnoses    Anxiety       Relevant Medications   escitalopram (LEXAPRO) 10 MG tablet     Meds ordered this encounter  Medications  . escitalopram (LEXAPRO) 10 MG tablet    Sig: Take 1 tablet (10 mg total) by mouth daily.    Dispense:  30 tablet    Refill:  0    Order Specific Question:   Supervising Provider    Answer:   Sallee Lange A [[id number]]  . naproxen (NAPROSYN) 375 MG tablet    Sig: Take 1 tablet (375 mg total) by mouth 2 (two) times daily with a meal. PRN pain    Dispense:  60 tablet    Refill:  0    Order Specific Question:   Supervising Provider    Answer:   Kathyrn Drown [[id number]]    Plan:  -Recommended bereavement counseling Referred to physical therapy  Discussed stress reduction -Education on lexapro, if medication cause any thoughts of suicide or worsening of symptoms -stop it and call asap -Naproxen prn arthritis pain with food; DC if any stomach upset -A trial sample of Voltaren gel given  Return in about 1 month (around 08/24/2020) for Virtual or phone is fine. Marland Kitchen

## 2020-07-28 ENCOUNTER — Encounter: Payer: Self-pay | Admitting: Nurse Practitioner

## 2020-08-09 ENCOUNTER — Other Ambulatory Visit: Payer: Self-pay

## 2020-08-09 ENCOUNTER — Ambulatory Visit (HOSPITAL_COMMUNITY): Payer: PPO | Attending: Nurse Practitioner | Admitting: Physical Therapy

## 2020-08-09 DIAGNOSIS — R262 Difficulty in walking, not elsewhere classified: Secondary | ICD-10-CM

## 2020-08-09 DIAGNOSIS — M5442 Lumbago with sciatica, left side: Secondary | ICD-10-CM | POA: Insufficient documentation

## 2020-08-09 DIAGNOSIS — M25552 Pain in left hip: Secondary | ICD-10-CM | POA: Diagnosis not present

## 2020-08-09 DIAGNOSIS — M25551 Pain in right hip: Secondary | ICD-10-CM

## 2020-08-09 DIAGNOSIS — G8929 Other chronic pain: Secondary | ICD-10-CM

## 2020-08-09 DIAGNOSIS — M5441 Lumbago with sciatica, right side: Secondary | ICD-10-CM | POA: Diagnosis not present

## 2020-08-09 NOTE — Therapy (Signed)
[facility] [address] [place], [place], [address] Phone: [phone]   Fax:  [phone]  Physical Therapy Evaluation  Patient Details  Name: Diane Watkins MRN: [id number] Date of Birth: 07-24-1950 Referring Provider (PT): Emmit Alexanders   Encounter Date: 08/09/2020   PT End of Session - 08/09/20 1156    Visit Number 1    Number of Visits 12    Date for PT Re-Evaluation 09/20/20    Authorization Type [employer] no auth no V, [employer] no auth or VL    Progress Note Due on Visit 10    PT Start Time 1130    PT Stop Time 1210    PT Time Calculation (min) 40 min    Activity Tolerance Patient tolerated treatment well    Behavior During Therapy [facility] for tasks assessed/performed           Past Medical History:  Diagnosis Date  . Arthritis   . High cholesterol   . Hyperlipidemia   . Hypertension     Past Surgical History:  Procedure Laterality Date  . CATARACT EXTRACTION W/PHACO Left 03/29/2018   Procedure: CATARACT EXTRACTION PHACO AND INTRAOCULAR LENS PLACEMENT (IOC);  Surgeon: [name], MD;  Location: [facility];  Service: Ophthalmology;  Laterality: Left;  CDE: 10.13  . CHOLECYSTECTOMY    . TUBAL LIGATION      There were no vitals filed for this visit.    Subjective Assessment - 08/09/20 1137    Subjective States that she has been having hip and knee pain for years mostly going up and down stairs and when she walks for a long distance. States that laying down and rest makes It feel better. States that it is worse at the end of the day. States that sometimes her left hurts more but it's not constant. STates that most of her pain is in her hips.    Limitations Standing;Walking;House hold activities    How long can you stand comfortably? morning one hour, evenings 10 minutes    Patient Stated Goals to have less pain    Currently in Pain? Yes    Pain Score 1     Pain Location Hip    Pain Orientation  Left;Right    Pain Descriptors / Indicators Aching    Pain Type Chronic pain    Pain Onset More than a month ago    Pain Frequency Constant    Aggravating Factors  standing and walking stairs    Pain Relieving Factors naproxen and laying down              [facility] PT Assessment - 08/09/20 0001      Assessment   Medical Diagnosis B hip and knee pain    Referring Provider (PT) Crolyn C Hoskins    Prior Therapy no      Balance Screen   Has the patient fallen in the past 6 months Yes    How many times? 1   arm was asleep and she couldn't grab onto anything   Has the patient had a decrease in activity level because of a fear of falling?  Yes    Is the patient reluctant to leave their home because of a fear of falling?  No      Home Social worker Private residence    Living Arrangements Spouse/significant other    Available Help at Discharge Family    Type of [place]  Home Access Stairs to enter    Entrance Stairs-Number of Steps 2    Entrance Stairs-Rails Can reach both    Home Layout One level    [place] - 2 wheels;Kasandra Knudsen - single point      Prior Function   Level of Independence Independent    Leisure work outside in the garden      Cognition   Overall Cognitive Status Within Functional Limits for tasks assessed      Observation/Other Assessments   Focus on Therapeutic Outcomes (FOTO)  35.66% function      ROM / Strength   AROM / PROM / Strength AROM;Strength      AROM   AROM Assessment Site Lumbar;Hip;Knee    Right/Left Hip Right;Left    Right Hip Flexion 115   no pain   Right Hip External Rotation  45   no pain   Right Hip Internal Rotation  30   no pain   Left Hip Flexion 115   minor hip pain   Left Hip External Rotation  50   no pain   Left Hip Internal Rotation  30   no pain   Right/Left Knee Right;Left    Right Knee Extension 0    Right Knee Flexion 125    Left Knee Extension 0    Left Knee Flexion 125    Lumbar  Flexion 50% limited   reduction in hip pain   Lumbar Extension 75% limited   slight pain in lower back wiht return upright, no pain in hips   Lumbar - Right Side Bend 50% limited   no pain   Lumbar - Left Side Bend 50% limited   no pain noted   Lumbar - Right Rotation 50% limited   no pain just stretching   Lumbar - Left Rotation 50% limited   no pain just stretching     Strength   Strength Assessment Site Hip;Knee;Ankle    Right/Left Hip Right;Left    Right Hip Flexion 4+/5    Right Hip Extension 3-/5   cramping in hamstring   Right Hip ABduction 4/5    Left Hip Flexion 4+/5    Left Hip Extension 4-/5    Left Hip ABduction 4+/5    Right/Left Knee Right;Left    Right Knee Flexion 4-/5    Right Knee Extension 4/5    Left Knee Flexion 4-/5    Left Knee Extension 4/5    Right/Left Ankle Right;Left    Right Ankle Dorsiflexion 5/5    Left Ankle Dorsiflexion 5/5      Flexibility   Soft Tissue Assessment /Muscle Length yes    Quadriceps on right ely'sable to get <6 inches from buttocks, on left cannot break 90 degrees knee flexion - tightness in thigh      Ambulation/Gait   Ambulation/Gait Yes    Ambulation Distance (Feet) 420 Feet    Gait Pattern Decreased arm swing - left;Decreased step length - right;Decreased step length - left;Step-through pattern;Decreased trunk rotation    Ambulation Surface Level;Indoor    Gait velocity decreased    Gait Comments 2MW, pain noted to increase by end of 2 minutes                      Objective measurements completed on examination: See above findings.       [facility] Adult PT Treatment/Exercise - 08/09/20 0001      Exercises   Exercises Lumbar  Lumbar Exercises: Stretches   Other Lumbar Stretch Exercise trunk rotation x5 5" holds bilaterally                  PT Education - 08/09/20 1304    Education Details on current Presentation, POC and HEP    Person(s) Educated Patient    Methods Explanation     Comprehension Verbalized understanding            PT Short Term Goals - 08/09/20 1249      PT SHORT TERM GOAL #1   Title Patient will be able to demonstrate at least 50% ROM in all directions of lumbar spine to demonstrate improved functional mobility    Time 3    Period Weeks    Status New    Target Date 08/30/20      PT SHORT TERM GOAL #2   Title Patient will be independent in self management strategies to improve quality of life and functional outcomes.    Time 3    Period Weeks    Status New    Target Date 08/30/20      PT SHORT TERM GOAL #3   Title Patient will report at least 50% improvement in overall symptoms and/or function to demonstrate improved functional mobility    Time 3    Period Weeks    Status New    Target Date 08/30/20             PT Long Term Goals - 08/09/20 1302      PT LONG TERM GOAL #1   Title Patient will be able to stand or walk for at least 30 minutes without onset of pain to demonstrate impoved function.    Time 6    Period Weeks    Status New    Target Date 09/20/20      PT LONG TERM GOAL #2   Title Patient will improve on FOTO score to meet predicted outcomes to demonstrate improved functional mobility.    Time 6    Period Weeks    Status New    Target Date 09/20/20      PT LONG TERM GOAL #3   Title Patient will report at least 75% improvement in overall symptoms and/or function to demonstrate improved functional mobility    Time 6    Period Weeks    Status New    Target Date 09/20/20                  Plan - 08/09/20 1250    Clinical Impression Statement Patient presents to therapy with complaints of bilateral hip and knee pain after standing and walking for long periods of time. Minimal pain noted during session but patient usually with increased pain at the end of the day. Within normal limits of hip and knee ROM and hip pain reduced with lumbar movements suggesting possible lumbar pathology as cause of current  symptoms. Patient educated in findings and would benefit from skilled physical therapy to improve quality of life and return her to optimal function.    Personal Factors and Comorbidities Age    Examination-Activity Limitations Stairs;Stand;Sit;Locomotion Level;Lift    Examination-Participation Restrictions Cleaning;Community Activity    Stability/Clinical Decision Making Stable/Uncomplicated    Clinical Decision Making Low    Rehab Potential Good    PT Frequency 2x / week    PT Duration 6 weeks    PT Treatment/Interventions ADLs/Self Care Home Management;Aquatic Therapy;Electrical Stimulation;Cryotherapy;Moist Heat;Traction;Balance training;Therapeutic exercise;Therapeutic activities;Functional mobility training;Stair  training;Gait training;Neuromuscular re-education;Patient/family education;Manual techniques;Passive range of motion;Dry needling;Joint Manipulations    PT Next Visit Plan lumbar mobility, core and hip strengthening    PT Home Exercise Plan trunk rotation -seated    Consulted and Agree with Plan of Care Patient           Patient will benefit from skilled therapeutic intervention in order to improve the following deficits and impairments:  Decreased endurance,Pain,Difficulty walking,Decreased range of motion,Decreased mobility,Decreased strength,Decreased activity tolerance  Visit Diagnosis: Difficulty in walking, not elsewhere classified  Chronic midline low back pain with bilateral sciatica  Pain in left hip  Pain in right hip     Problem List Patient Active Problem List   Diagnosis Date Noted  . Hyperlipidemia 07/09/2019  . COVID-19 virus detected 03/13/2019  . COVID-19 virus infection 03/12/2019  . Fever 03/12/2019  . Primary osteoarthritis of both knees 05/01/2015  . Osteopenia 05/17/2014  . Essential hypertension, benign 05/04/2013  . Hypertriglyceridemia 05/04/2013    1:33 PM, 08/09/20 Jerene Pitch, DPT Physical Therapy with [facility]  [phone] office  [place] [address] [place], [place], [address] Phone: [phone]   Fax:  [phone]  Name: Diane Watkins MRN: [id number] Date of Birth: 1951/03/17

## 2020-08-14 ENCOUNTER — Other Ambulatory Visit: Payer: Self-pay

## 2020-08-14 ENCOUNTER — Encounter (HOSPITAL_COMMUNITY): Payer: Self-pay | Admitting: Physical Therapy

## 2020-08-14 ENCOUNTER — Ambulatory Visit (HOSPITAL_COMMUNITY): Payer: PPO | Attending: Nurse Practitioner | Admitting: Physical Therapy

## 2020-08-14 DIAGNOSIS — M25551 Pain in right hip: Secondary | ICD-10-CM | POA: Diagnosis not present

## 2020-08-14 DIAGNOSIS — R262 Difficulty in walking, not elsewhere classified: Secondary | ICD-10-CM | POA: Diagnosis not present

## 2020-08-14 DIAGNOSIS — M25552 Pain in left hip: Secondary | ICD-10-CM | POA: Diagnosis not present

## 2020-08-14 DIAGNOSIS — M5442 Lumbago with sciatica, left side: Secondary | ICD-10-CM | POA: Diagnosis not present

## 2020-08-14 DIAGNOSIS — M5441 Lumbago with sciatica, right side: Secondary | ICD-10-CM | POA: Diagnosis not present

## 2020-08-14 DIAGNOSIS — G8929 Other chronic pain: Secondary | ICD-10-CM | POA: Insufficient documentation

## 2020-08-14 NOTE — Therapy (Signed)
[place], [place], [address] Phone: [phone]   Fax:  [phone]  Physical Therapy Treatment  Patient Details  Name: Diane Watkins MRN: [id number] Date of Birth: May 14, 1951 Referring Provider (PT): Emmit Alexanders   Encounter Date: 08/14/2020   PT End of Session - 08/14/20 0919    Visit Number 2    Number of Visits 12    Date for PT Re-Evaluation 09/20/20    Authorization Type [employer] no auth no V, [employer] no auth or VL    Progress Note Due on Visit 10    PT Start Time 0920    PT Stop Time 0958    PT Time Calculation (min) 38 min    Activity Tolerance Patient tolerated treatment well    Behavior During Therapy [facility] for tasks assessed/performed           Past Medical History:  Diagnosis Date  . Arthritis   . High cholesterol   . Hyperlipidemia   . Hypertension     Past Surgical History:  Procedure Laterality Date  . CATARACT EXTRACTION W/PHACO Left 03/29/2018   Procedure: CATARACT EXTRACTION PHACO AND INTRAOCULAR LENS PLACEMENT (IOC);  Surgeon: [name], MD;  Location: [facility];  Service: Ophthalmology;  Laterality: Left;  CDE: 10.13  . CHOLECYSTECTOMY    . TUBAL LIGATION      There were no vitals filed for this visit.   Subjective Assessment - 08/14/20 0924    Subjective States she moved a certain way on Sunday after sitting playing cards and she felt like she pulled something wrong way and then yesterday she was scrubbing the carpet which made it worse. States that she only did the exercise one day as she was busy. States that she tried different lumbar exercises and it felt better.    Limitations Standing;Walking;House hold activities    How long can you stand comfortably? morning one hour, evenings 10 minutes    Patient Stated Goals to have less pain    Currently in Pain? Yes    Pain Location Knee    Pain Orientation Left    Pain Descriptors / Indicators Other  (Comment)   grabbing pain with movement.   Pain Type Chronic pain    Pain Radiating Towards in both knees wtih L>R,    Pain Onset More than a month ago              [facility] PT Assessment - 08/14/20 0001      Assessment   Medical Diagnosis B hip and knee pain    Referring Provider (PT) Emmit Alexanders                         [facility] Adult PT Treatment/Exercise - 08/14/20 0001      Lumbar Exercises: Stretches   Single Knee to Chest Stretch Left;Right   x10 5" holds   Double Knee to Chest Stretch --   x15 5" holds   Lower Trunk Rotation --   x15 bilateral 5" holds   Other Lumbar Stretch Exercise seated lumbar felxion x10 5" holds    Other Lumbar Stretch Exercise seated trunk rotation x5 5" holds bilaterally      Lumbar Exercises: Supine   Bridge 5 seconds   x30 reps                 PT Education - 08/14/20 0952    Education Details on  importance of movement/stretching after being in static position, on anatomy and frequency of exercises/stretches.    Person(s) Educated Patient    Methods Explanation    Comprehension Verbalized understanding            PT Short Term Goals - 08/09/20 1249      PT SHORT TERM GOAL #1   Title Patient will be able to demonstrate at least 50% ROM in all directions of lumbar spine to demonstrate improved functional mobility    Time 3    Period Weeks    Status New    Target Date 08/30/20      PT SHORT TERM GOAL #2   Title Patient will be independent in self management strategies to improve quality of life and functional outcomes.    Time 3    Period Weeks    Status New    Target Date 08/30/20      PT SHORT TERM GOAL #3   Title Patient will report at least 50% improvement in overall symptoms and/or function to demonstrate improved functional mobility    Time 3    Period Weeks    Status New    Target Date 08/30/20             PT Long Term Goals - 08/09/20 1302      PT LONG TERM GOAL #1   Title Patient will  be able to stand or walk for at least 30 minutes without onset of pain to demonstrate impoved function.    Time 6    Period Weeks    Status New    Target Date 09/20/20      PT LONG TERM GOAL #2   Title Patient will improve on FOTO score to meet predicted outcomes to demonstrate improved functional mobility.    Time 6    Period Weeks    Status New    Target Date 09/20/20      PT LONG TERM GOAL #3   Title Patient will report at least 75% improvement in overall symptoms and/or function to demonstrate improved functional mobility    Time 6    Period Weeks    Status New    Target Date 09/20/20                 Plan - 08/14/20 0953    Clinical Impression Statement Patient tolerated session well. Focused on lumbar mobility and education on importance of stretching throughout the day. Reduced leg and back symptoms noted with lumbar stretches. No pain noted end of session, will continue with lumbar mobility and strengthening as tolerated.    Personal Factors and Comorbidities Age    Examination-Activity Limitations Stairs;Stand;Sit;Locomotion Level;Lift    Examination-Participation Restrictions Cleaning;Community Activity    Stability/Clinical Decision Making Stable/Uncomplicated    Rehab Potential Good    PT Frequency 2x / week    PT Duration 6 weeks    PT Treatment/Interventions ADLs/Self Care Home Management;Aquatic Therapy;Electrical Stimulation;Cryotherapy;Moist Heat;Traction;Balance training;Therapeutic exercise;Therapeutic activities;Functional mobility training;Stair training;Gait training;Neuromuscular re-education;Patient/family education;Manual techniques;Passive range of motion;Dry needling;Joint Manipulations    PT Next Visit Plan lumbar mobility, core and hip strengthening    PT Home Exercise Plan trunk rotation -seated, 3/1 lumbar flexion, DKC, SKC, lumbar rotation, bridges    Consulted and Agree with Plan of Care Patient           Patient will benefit from skilled  therapeutic intervention in order to improve the following deficits and impairments:  Decreased endurance,Pain,Difficulty walking,Decreased range of motion,Decreased  mobility,Decreased strength,Decreased activity tolerance  Visit Diagnosis: Difficulty in walking, not elsewhere classified  Chronic midline low back pain with bilateral sciatica  Pain in left hip  Pain in right hip     Problem List Patient Active Problem List   Diagnosis Date Noted  . Hyperlipidemia 07/09/2019  . COVID-19 virus detected 03/13/2019  . COVID-19 virus infection 03/12/2019  . Fever 03/12/2019  . Primary osteoarthritis of both knees 05/01/2015  . Osteopenia 05/17/2014  . Essential hypertension, benign 05/04/2013  . Hypertriglyceridemia 05/04/2013   9:58 AM, 08/14/20 Jerene Pitch, DPT Physical Therapy with [facility]  [phone] office  [place] [address] [place], [place], [address] Phone: [phone]   Fax:  [phone]  Name: Diane Watkins MRN: [id number] Date of Birth: 04/16/1951

## 2020-08-15 ENCOUNTER — Ambulatory Visit (HOSPITAL_COMMUNITY): Payer: PPO | Admitting: Physical Therapy

## 2020-08-15 ENCOUNTER — Encounter (HOSPITAL_COMMUNITY): Payer: Self-pay | Admitting: Physical Therapy

## 2020-08-15 DIAGNOSIS — G8929 Other chronic pain: Secondary | ICD-10-CM

## 2020-08-15 DIAGNOSIS — M5441 Lumbago with sciatica, right side: Secondary | ICD-10-CM

## 2020-08-15 DIAGNOSIS — M25551 Pain in right hip: Secondary | ICD-10-CM

## 2020-08-15 DIAGNOSIS — M25552 Pain in left hip: Secondary | ICD-10-CM

## 2020-08-15 DIAGNOSIS — R262 Difficulty in walking, not elsewhere classified: Secondary | ICD-10-CM | POA: Diagnosis not present

## 2020-08-15 NOTE — Patient Instructions (Signed)
Access Code: [email or web address] URL: [email or web address] Date: 08/15/2020 Prepared by: Mitzi Hansen Modesty Rudy  Exercises Sidelying Hip Abduction - 1 x daily - 7 x weekly - 2 sets - 10 reps Seated Long Arc Quad - 1 x daily - 7 x weekly - 10 reps - 10 second hold Heel rises with counter support - 1 x daily - 7 x weekly - 2 sets - 15 reps

## 2020-08-15 NOTE — Therapy (Signed)
[place] [address] [place], [place], [address] Phone: [phone]   Fax:  [phone]  Physical Therapy Treatment  Patient Details  Name: Diane Watkins MRN: [id number] Date of Birth: Aug 24, 1950 Referring Provider (PT): Emmit Alexanders   Encounter Date: 08/15/2020   PT End of Session - 08/15/20 1001    Visit Number 3    Number of Visits 12    Date for PT Re-Evaluation 09/20/20    Authorization Type [employer] no auth no V, [employer] no auth or VL    Progress Note Due on Visit 10    PT Start Time 1002    PT Stop Time 1042    PT Time Calculation (min) 40 min    Activity Tolerance Patient tolerated treatment well    Behavior During Therapy [facility] for tasks assessed/performed           Past Medical History:  Diagnosis Date  . Arthritis   . High cholesterol   . Hyperlipidemia   . Hypertension     Past Surgical History:  Procedure Laterality Date  . CATARACT EXTRACTION W/PHACO Left 03/29/2018   Procedure: CATARACT EXTRACTION PHACO AND INTRAOCULAR LENS PLACEMENT (IOC);  Surgeon: [name], MD;  Location: [facility];  Service: Ophthalmology;  Laterality: Left;  CDE: 10.13  . CHOLECYSTECTOMY    . TUBAL LIGATION      There were no vitals filed for this visit.   Subjective Assessment - 08/15/20 1002    Subjective Patient states she likes doing the exercises. Her exercises have been helpful. Walking is still troublesome and she wants to go walking with people but isnt able to because she gets in pain.    Limitations Standing;Walking;House hold activities    How long can you stand comfortably? morning one hour, evenings 10 minutes    Patient Stated Goals to have less pain    Currently in Pain? No/denies    Pain Onset More than a month ago                             [facility] Adult PT Treatment/Exercise - 08/15/20 0001      Lumbar Exercises: Stretches   Single Knee to Chest Stretch Right;Left     Single Knee to Chest Stretch Limitations 10 reps 5 second holds bilateral    Lower Trunk Rotation Limitations 15x 5 second holds bilateral      Lumbar Exercises: Standing   Heel Raises 15 reps      Lumbar Exercises: Seated   Long Arc Quad on Chair 1 set;10 reps;Both    LAQ on Chair Limitations 10 second holds      Lumbar Exercises: Supine   Bridge 15 reps;5 seconds    Other Supine Lumbar Exercises DKTC with ball under feet 10x 5 second holds      Lumbar Exercises: Sidelying   Hip Abduction 10 reps    Hip Abduction Limitations bilateral                  PT Education - 08/15/20 1002    Education Details HEP, exercise mechanics    Person(s) Educated Patient    Methods Explanation;Demonstration    Comprehension Verbalized understanding;Returned demonstration            PT Short Term Goals - 08/09/20 1249      PT SHORT TERM GOAL #1   Title Patient will be able to demonstrate at least  50% ROM in all directions of lumbar spine to demonstrate improved functional mobility    Time 3    Period Weeks    Status New    Target Date 08/30/20      PT SHORT TERM GOAL #2   Title Patient will be independent in self management strategies to improve quality of life and functional outcomes.    Time 3    Period Weeks    Status New    Target Date 08/30/20      PT SHORT TERM GOAL #3   Title Patient will report at least 50% improvement in overall symptoms and/or function to demonstrate improved functional mobility    Time 3    Period Weeks    Status New    Target Date 08/30/20             PT Long Term Goals - 08/09/20 1302      PT LONG TERM GOAL #1   Title Patient will be able to stand or walk for at least 30 minutes without onset of pain to demonstrate impoved function.    Time 6    Period Weeks    Status New    Target Date 09/20/20      PT LONG TERM GOAL #2   Title Patient will improve on FOTO score to meet predicted outcomes to demonstrate improved functional  mobility.    Time 6    Period Weeks    Status New    Target Date 09/20/20      PT LONG TERM GOAL #3   Title Patient will report at least 75% improvement in overall symptoms and/or function to demonstrate improved functional mobility    Time 6    Period Weeks    Status New    Target Date 09/20/20                 Plan - 08/15/20 1002    Clinical Impression Statement Patient requires minimal cueing for previously completed stretches and exercise. Patient tolerates addition of DKTC with feet on ball for additional core strengthening. Patient given manual, tactile, and verbal cueing for proper mechanics of hip abduction exercise. Patient stating intermittent knee pain with activity, began LE strengthening as patient was showing deficits. Patient fatigued at end of session. Patient will continue to benefit from skilled physical therapy in order to reduce impairment and improve function.    Personal Factors and Comorbidities Age    Examination-Activity Limitations Stairs;Stand;Sit;Locomotion Level;Lift    Examination-Participation Restrictions Cleaning;Community Activity    Stability/Clinical Decision Making Stable/Uncomplicated    Rehab Potential Good    PT Frequency 2x / week    PT Duration 6 weeks    PT Treatment/Interventions ADLs/Self Care Home Management;Aquatic Therapy;Electrical Stimulation;Cryotherapy;Moist Heat;Traction;Balance training;Therapeutic exercise;Therapeutic activities;Functional mobility training;Stair training;Gait training;Neuromuscular re-education;Patient/family education;Manual techniques;Passive range of motion;Dry needling;Joint Manipulations    PT Next Visit Plan lumbar mobility, core and hip strengthening    PT Home Exercise Plan trunk rotation -seated, 3/1 lumbar flexion, DKC, SKC, lumbar rotation, bridges    Consulted and Agree with Plan of Care Patient           Patient will benefit from skilled therapeutic intervention in order to improve the  following deficits and impairments:  Decreased endurance,Pain,Difficulty walking,Decreased range of motion,Decreased mobility,Decreased strength,Decreased activity tolerance  Visit Diagnosis: Difficulty in walking, not elsewhere classified  Chronic midline low back pain with bilateral sciatica  Pain in left hip  Pain in right hip  Problem List Patient Active Problem List   Diagnosis Date Noted  . Hyperlipidemia 07/09/2019  . COVID-19 virus detected 03/13/2019  . COVID-19 virus infection 03/12/2019  . Fever 03/12/2019  . Primary osteoarthritis of both knees 05/01/2015  . Osteopenia 05/17/2014  . Essential hypertension, benign 05/04/2013  . Hypertriglyceridemia 05/04/2013    10:44 AM, 08/15/20 Mearl Latin PT, DPT Physical Therapist at [facility] [place], [place], [address] Phone: [phone]   Fax:  [phone]  Name: Diane Watkins MRN: [id number] Date of Birth: 1951/04/04

## 2020-08-21 ENCOUNTER — Ambulatory Visit (HOSPITAL_COMMUNITY): Payer: PPO | Admitting: Physical Therapy

## 2020-08-21 ENCOUNTER — Other Ambulatory Visit: Payer: Self-pay

## 2020-08-21 ENCOUNTER — Encounter (HOSPITAL_COMMUNITY): Payer: Self-pay | Admitting: Physical Therapy

## 2020-08-21 DIAGNOSIS — R262 Difficulty in walking, not elsewhere classified: Secondary | ICD-10-CM

## 2020-08-21 DIAGNOSIS — M25551 Pain in right hip: Secondary | ICD-10-CM

## 2020-08-21 DIAGNOSIS — M25552 Pain in left hip: Secondary | ICD-10-CM

## 2020-08-21 DIAGNOSIS — G8929 Other chronic pain: Secondary | ICD-10-CM

## 2020-08-21 NOTE — Therapy (Signed)
[place], [place], [address] Phone: [phone]   Fax:  [phone]  Physical Therapy Treatment  Patient Details  Name: Diane Watkins MRN: [id number] Date of Birth: Sep 14, 1950 Referring Provider (PT): Emmit Alexanders   Encounter Date: 08/21/2020   PT End of Session - 08/21/20 1403    Visit Number 4    Number of Visits 12    Date for PT Re-Evaluation 09/20/20    Authorization Type [employer] no auth no V, [employer] no auth or VL    Progress Note Due on Visit 10    PT Start Time 1404    PT Stop Time 1442    PT Time Calculation (min) 38 min    Activity Tolerance Patient tolerated treatment well    Behavior During Therapy [facility] for tasks assessed/performed           Past Medical History:  Diagnosis Date  . Arthritis   . High cholesterol   . Hyperlipidemia   . Hypertension     Past Surgical History:  Procedure Laterality Date  . CATARACT EXTRACTION W/PHACO Left 03/29/2018   Procedure: CATARACT EXTRACTION PHACO AND INTRAOCULAR LENS PLACEMENT (IOC);  Surgeon: [name], MD;  Location: [facility];  Service: Ophthalmology;  Laterality: Left;  CDE: 10.13  . CHOLECYSTECTOMY    . TUBAL LIGATION      There were no vitals filed for this visit.   Subjective Assessment - 08/21/20 1409    Subjective States that exercises are going well and are helping with hip and right knee pain. States she did a lot of yard work Thursday and Saturday with no issues but turned a certain way Sunday and then again on Monday and her left knee started bothering her. States that it has been swelling and she has needed to use her brace on her left knee. States that her pain in her left knee is currently 6/10 with walking described as "feeling like it is coming out of the joint."    Limitations Standing;Walking;House hold activities    How long can you stand comfortably? morning one hour, evenings 10 minutes    Patient Stated  Goals to have less pain    Pain Onset More than a month ago              [facility] PT Assessment - 08/21/20 0001      Assessment   Medical Diagnosis B hip and knee pain    Referring Provider (PT) Emmit Alexanders                         [facility] Adult PT Treatment/Exercise - 08/21/20 0001      Lumbar Exercises: Stretches   Lower Trunk Rotation --   2x10 B 5" holds     Lumbar Exercises: Standing   Other Standing Lumbar Exercises lateral steps with cues to keep feet straight x5 laps of 10 feet      Lumbar Exercises: Seated   Other Seated Lumbar Exercises lumbar flexion x10 5" holds, self mobilization with massage stick to left lower leg    Other Seated Lumbar Exercises trunk twists x15 5" holds      Manual Therapy   Manual Therapy Soft tissue mobilization;Joint mobilization    Manual therapy comments all manual interventions performed independently of other interventions    Joint Mobilization left tib/femur joint mobilizations AP/PA grade II/III tolerated well    Soft tissue mobilization  STM to left lower leg                  PT Education - 08/21/20 1436    Education Details on importance of elevation, how to elevate, swelling and causes of swelling    Person(s) Educated Patient    Methods Explanation    Comprehension Verbalized understanding            PT Short Term Goals - 08/09/20 1249      PT SHORT TERM GOAL #1   Title Patient will be able to demonstrate at least 50% ROM in all directions of lumbar spine to demonstrate improved functional mobility    Time 3    Period Weeks    Status New    Target Date 08/30/20      PT SHORT TERM GOAL #2   Title Patient will be independent in self management strategies to improve quality of life and functional outcomes.    Time 3    Period Weeks    Status New    Target Date 08/30/20      PT SHORT TERM GOAL #3   Title Patient will report at least 50% improvement in overall symptoms and/or function to  demonstrate improved functional mobility    Time 3    Period Weeks    Status New    Target Date 08/30/20             PT Long Term Goals - 08/09/20 1302      PT LONG TERM GOAL #1   Title Patient will be able to stand or walk for at least 30 minutes without onset of pain to demonstrate impoved function.    Time 6    Period Weeks    Status New    Target Date 09/20/20      PT LONG TERM GOAL #2   Title Patient will improve on FOTO score to meet predicted outcomes to demonstrate improved functional mobility.    Time 6    Period Weeks    Status New    Target Date 09/20/20      PT LONG TERM GOAL #3   Title Patient will report at least 75% improvement in overall symptoms and/or function to demonstrate improved functional mobility    Time 6    Period Weeks    Status New    Target Date 09/20/20                 Plan - 08/21/20 1403    Clinical Impression Statement Patient with reports of increased knee pain today. Improved knee extension noted with soft tissue mobilization and joint mobilization. Educated patient on self mobilization with massage stick and on elevation secondary to swelling and tightness in left lower leg. No pain with standing and walking noted end of session. Patient would continue to benefit from skilled physical therapy    Personal Factors and Comorbidities Age    Examination-Activity Limitations Stairs;Stand;Sit;Locomotion Level;Lift    Examination-Participation Restrictions Cleaning;Community Activity    Stability/Clinical Decision Making Stable/Uncomplicated    Rehab Potential Good    PT Frequency 2x / week    PT Duration 6 weeks    PT Treatment/Interventions ADLs/Self Care Home Management;Aquatic Therapy;Electrical Stimulation;Cryotherapy;Moist Heat;Traction;Balance training;Therapeutic exercise;Therapeutic activities;Functional mobility training;Stair training;Gait training;Neuromuscular re-education;Patient/family education;Manual techniques;Passive  range of motion;Dry needling;Joint Manipulations    PT Next Visit Plan lumbar mobility, core and hip strengthening, f/u with left knee pain    PT Home Exercise Plan trunk rotation -seated,  3/1 lumbar flexion, DKC, SKC, lumbar rotation, bridges; 3/8 elevation and self mobilization with massage stick    Consulted and Agree with Plan of Care Patient           Patient will benefit from skilled therapeutic intervention in order to improve the following deficits and impairments:  Decreased endurance,Pain,Difficulty walking,Decreased range of motion,Decreased mobility,Decreased strength,Decreased activity tolerance  Visit Diagnosis: Difficulty in walking, not elsewhere classified  Chronic midline low back pain with bilateral sciatica  Pain in left hip  Pain in right hip     Problem List Patient Active Problem List   Diagnosis Date Noted  . Hyperlipidemia 07/09/2019  . COVID-19 virus detected 03/13/2019  . COVID-19 virus infection 03/12/2019  . Fever 03/12/2019  . Primary osteoarthritis of both knees 05/01/2015  . Osteopenia 05/17/2014  . Essential hypertension, benign 05/04/2013  . Hypertriglyceridemia 05/04/2013    2:42 PM, 08/21/20 Jerene Pitch, DPT Physical Therapy with [facility]  [phone] office  [place] [address] [place], [place], [address] Phone: [phone]   Fax:  [phone]  Name: Diane Watkins MRN: [id number] Date of Birth: 1950/08/05

## 2020-08-22 ENCOUNTER — Ambulatory Visit (HOSPITAL_COMMUNITY): Payer: PPO | Admitting: Physical Therapy

## 2020-08-22 ENCOUNTER — Encounter (HOSPITAL_COMMUNITY): Payer: Self-pay | Admitting: Physical Therapy

## 2020-08-22 DIAGNOSIS — M25551 Pain in right hip: Secondary | ICD-10-CM

## 2020-08-22 DIAGNOSIS — M5442 Lumbago with sciatica, left side: Secondary | ICD-10-CM

## 2020-08-22 DIAGNOSIS — G8929 Other chronic pain: Secondary | ICD-10-CM

## 2020-08-22 DIAGNOSIS — R262 Difficulty in walking, not elsewhere classified: Secondary | ICD-10-CM | POA: Diagnosis not present

## 2020-08-22 DIAGNOSIS — M25552 Pain in left hip: Secondary | ICD-10-CM

## 2020-08-22 NOTE — Therapy (Signed)
[place], [place], [address] Phone: [phone]   Fax:  [phone]  Physical Therapy Treatment  Patient Details  Name: Diane Watkins MRN: [id number] Date of Birth: 01/17/1951 Referring Provider (PT): Emmit Alexanders   Encounter Date: 08/22/2020   PT End of Session - 08/22/20 0913    Visit Number 5    Number of Visits 12    Date for PT Re-Evaluation 09/20/20    Authorization Type [employer] no auth no V, [employer] no auth or VL    Progress Note Due on Visit 10    PT Start Time 0914    PT Stop Time 0954    PT Time Calculation (min) 40 min    Activity Tolerance Patient tolerated treatment well    Behavior During Therapy [facility] for tasks assessed/performed           Past Medical History:  Diagnosis Date  . Arthritis   . High cholesterol   . Hyperlipidemia   . Hypertension     Past Surgical History:  Procedure Laterality Date  . CATARACT EXTRACTION W/PHACO Left 03/29/2018   Procedure: CATARACT EXTRACTION PHACO AND INTRAOCULAR LENS PLACEMENT (IOC);  Surgeon: [name], MD;  Location: [facility];  Service: Ophthalmology;  Laterality: Left;  CDE: 10.13  . CHOLECYSTECTOMY    . TUBAL LIGATION      There were no vitals filed for this visit.   Subjective Assessment - 08/22/20 0914    Subjective Patient states she is feeling better today. Patient states everything usually  hurts a little. Her home exercises are going well.    Limitations Standing;Walking;House hold activities    How long can you stand comfortably? morning one hour, evenings 10 minutes    Patient Stated Goals to have less pain    Currently in Pain? Yes    Pain Score 1     Pain Location Knee    Pain Orientation Left    Pain Onset More than a month ago                             [facility] Adult PT Treatment/Exercise - 08/22/20 0001      Lumbar Exercises: Standing   Heel Raises 10 reps    Heel Raises  Limitations 2 sets    Other Standing Lumbar Exercises step up 4 inch step 2x 10 bilateral, palof press 2x10 bilateral with green band    Other Standing Lumbar Exercises lateral steps with cues to keep feet straight x4 laps of 15 feet, hamstring curls 2x 10 bilateral 2#      Lumbar Exercises: Seated   Sit to Stand 10 reps    Other Seated Lumbar Exercises lumbar flexion x10 5" holds with blue ball, flexion stretch with diagonals  with ball 10x 5 second holds each side                  PT Education - 08/22/20 0914    Education Details HEP, exercise mechanics, probable muscle soreness    Person(s) Educated Patient    Methods Explanation;Demonstration;Handout    Comprehension Verbalized understanding;Returned demonstration            PT Short Term Goals - 08/09/20 1249      PT SHORT TERM GOAL #1   Title Patient will be able to demonstrate at least 50% ROM in all directions of lumbar spine to demonstrate improved functional mobility  Time 3    Period Weeks    Status New    Target Date 08/30/20      PT SHORT TERM GOAL #2   Title Patient will be independent in self management strategies to improve quality of life and functional outcomes.    Time 3    Period Weeks    Status New    Target Date 08/30/20      PT SHORT TERM GOAL #3   Title Patient will report at least 50% improvement in overall symptoms and/or function to demonstrate improved functional mobility    Time 3    Period Weeks    Status New    Target Date 08/30/20             PT Long Term Goals - 08/09/20 1302      PT LONG TERM GOAL #1   Title Patient will be able to stand or walk for at least 30 minutes without onset of pain to demonstrate impoved function.    Time 6    Period Weeks    Status New    Target Date 09/20/20      PT LONG TERM GOAL #2   Title Patient will improve on FOTO score to meet predicted outcomes to demonstrate improved functional mobility.    Time 6    Period Weeks    Status New     Target Date 09/20/20      PT LONG TERM GOAL #3   Title Patient will report at least 75% improvement in overall symptoms and/or function to demonstrate improved functional mobility    Time 6    Period Weeks    Status New    Target Date 09/20/20                 Plan - 08/22/20 0914    Clinical Impression Statement Began session with LE strengthening as patient has deficits throughout bilateral LE. Patient completes step up with ease on RLE but increased difficulty on LLE lacking TKE. She requires bilateral UE support for balance and strength deficits. Patient requires minimal for LE positioning with lateral stepping. She tolerates addition of STS without c/o knee pain or increase in symptoms. Patient will continue to benefit from skilled physical therapy in order to reduce impairment and improve function.    Personal Factors and Comorbidities Age    Examination-Activity Limitations Stairs;Stand;Sit;Locomotion Level;Lift    Examination-Participation Restrictions Cleaning;Community Activity    Stability/Clinical Decision Making Stable/Uncomplicated    Rehab Potential Good    PT Frequency 2x / week    PT Duration 6 weeks    PT Treatment/Interventions ADLs/Self Care Home Management;Aquatic Therapy;Electrical Stimulation;Cryotherapy;Moist Heat;Traction;Balance training;Therapeutic exercise;Therapeutic activities;Functional mobility training;Stair training;Gait training;Neuromuscular re-education;Patient/family education;Manual techniques;Passive range of motion;Dry needling;Joint Manipulations    PT Next Visit Plan lumbar mobility, core and hip strengthening, f/u with left knee pain    PT Home Exercise Plan trunk rotation -seated, 3/1 lumbar flexion, DKC, SKC, lumbar rotation, bridges; 3/8 elevation and self mobilization with massage stick 3/9 STS, HR, lateral stepping    Consulted and Agree with Plan of Care Patient           Patient will benefit from skilled therapeutic  intervention in order to improve the following deficits and impairments:  Decreased endurance,Pain,Difficulty walking,Decreased range of motion,Decreased mobility,Decreased strength,Decreased activity tolerance  Visit Diagnosis: Difficulty in walking, not elsewhere classified  Chronic midline low back pain with bilateral sciatica  Pain in left hip  Pain in right hip  Problem List Patient Active Problem List   Diagnosis Date Noted  . Hyperlipidemia 07/09/2019  . COVID-19 virus detected 03/13/2019  . COVID-19 virus infection 03/12/2019  . Fever 03/12/2019  . Primary osteoarthritis of both knees 05/01/2015  . Osteopenia 05/17/2014  . Essential hypertension, benign 05/04/2013  . Hypertriglyceridemia 05/04/2013    9:55 AM, 08/22/20 Mearl Latin PT, DPT Physical Therapist at [place], [place], [address] Phone: [phone]   Fax:  [phone]  Name: Diane Watkins MRN: [id number] Date of Birth: [birth year]

## 2020-08-22 NOTE — Patient Instructions (Signed)
Access Code: [email or web address] URL: [email or web address] Date: 08/22/2020 Prepared by: Mitzi Hansen Dwayne Begay  Exercises Sit to Stand with Arms Crossed - 1 x daily - 7 x weekly - 1-2 sets - 10 reps Side Stepping with Counter Support - 1 x daily - 7 x weekly - 3 sets - 10 reps Heel rises with counter support - 1 x daily - 7 x weekly - 2 sets - 10 reps

## 2020-08-24 ENCOUNTER — Other Ambulatory Visit: Payer: Self-pay

## 2020-08-24 ENCOUNTER — Telehealth: Payer: Self-pay | Admitting: Family Medicine

## 2020-08-24 ENCOUNTER — Encounter: Payer: Self-pay | Admitting: Nurse Practitioner

## 2020-08-24 ENCOUNTER — Telehealth (INDEPENDENT_AMBULATORY_CARE_PROVIDER_SITE_OTHER): Payer: PPO | Admitting: Nurse Practitioner

## 2020-08-24 DIAGNOSIS — M170 Bilateral primary osteoarthritis of knee: Secondary | ICD-10-CM | POA: Diagnosis not present

## 2020-08-24 DIAGNOSIS — I10 Essential (primary) hypertension: Secondary | ICD-10-CM | POA: Diagnosis not present

## 2020-08-24 NOTE — Telephone Encounter (Signed)
[name], picazo are scheduled for a virtual visit with your provider today.    Just as we do with appointments in the office, we must obtain your consent to participate.  Your consent will be active for this visit and any virtual visit you may have with one of our providers in the next 365 days.    If you have a MyChart account, I can also send a copy of this consent to you electronically.  All virtual visits are billed to your insurance company just like a traditional visit in the office.  As this is a virtual visit, video technology does not allow for your provider to perform a traditional examination.  This may limit your provider's ability to fully assess your condition.  If your provider identifies any concerns that need to be evaluated in person or the need to arrange testing such as labs, EKG, etc, we will make arrangements to do so.    Although advances in technology are sophisticated, we cannot ensure that it will always work on either your end or our end.  If the connection with a video visit is poor, we may have to switch to a telephone visit.  With either a video or telephone visit, we are not always able to ensure that we have a secure connection.   I need to obtain your verbal consent now.   Are you willing to proceed with your visit today?   Diane Watkins has provided verbal consent on 08/24/2020 for a virtual visit (video or telephone).   Vicente Males, LPN 2/95/6213  08:65 AM

## 2020-08-24 NOTE — Progress Notes (Signed)
   Subjective:    Patient ID: Diane Watkins, female    DOB: May 21, 1951, 70 y.o.   MRN: [id number]  HPI Pt here for one month follow up. Pt last seen 07/27/20 for osteoarthritis and HTN. Pt states she is doing therapy twice per week.   Virtual Visit via Telephone Note  I connected with Diane Watkins on 08/24/20 at 11:20 AM EST by telephone and verified that I am speaking with the correct person using two identifiers.  Location: Patient: home Provider: office   I discussed the limitations, risks, security and privacy concerns of performing an evaluation and management service by telephone and the availability of in person appointments. I also discussed with the patient that there may be a patient responsible charge related to this service. The patient expressed understanding and agreed to proceed.   History of Present Illness: Presents for follow-up see previous note.  Patient has decided to hold off on Lexapro for now.  States she felt like she was having a bad day the last time she was here.  Agrees to start this up if needed. Blood pressures at home are running 130s over 80s. Has started physical therapy, this "feels good".  Has been very active working in her yard.  Continues to have significant left knee pain, states she twisted her knee and has been having swelling and pain since then.  Has seen an orthopedic specialist in the [facility] in the past, has received injections in her other knee which greatly helped.   Observations/Objective: Today's visit was via telephone Physical exam was not possible for this visit Alert, oriented.  Cheerful affect.  Assessment and Plan: Problem List Items Addressed This Visit      Cardiovascular and Mediastinum   Essential hypertension, benign     Musculoskeletal and Integument   Primary osteoarthritis of both knees - Primary       Follow Up Instructions: Patient elects to hold on Lexapro 10 mg for now.  Agrees to start this up  if needed. She also elects to continue physical therapy for now since it does help her chronic pain.  Recommend that patient contact her orthopedic specialist in [facility] for a recheck and possible injection in the affected knee.  She agrees with this plan. Return in about 5 months (around 01/24/2021) for Follow up in August for routine check up. Call back sooner if needed.   I discussed the assessment and treatment plan with the patient. The patient was provided an opportunity to ask questions and all were answered. The patient agreed with the plan and demonstrated an understanding of the instructions.   The patient was advised to call back or seek an in-person evaluation if the symptoms worsen or if the condition fails to improve as anticipated.  I provided 15 minutes of non-face-to-face time during this encounter.       Review of Systems     Objective:   Physical Exam        Assessment & Plan:

## 2020-08-28 ENCOUNTER — Other Ambulatory Visit: Payer: Self-pay

## 2020-08-28 ENCOUNTER — Encounter (HOSPITAL_COMMUNITY): Payer: Self-pay | Admitting: Physical Therapy

## 2020-08-28 ENCOUNTER — Ambulatory Visit (HOSPITAL_COMMUNITY): Payer: PPO | Admitting: Physical Therapy

## 2020-08-28 DIAGNOSIS — M25552 Pain in left hip: Secondary | ICD-10-CM

## 2020-08-28 DIAGNOSIS — G8929 Other chronic pain: Secondary | ICD-10-CM

## 2020-08-28 DIAGNOSIS — R262 Difficulty in walking, not elsewhere classified: Secondary | ICD-10-CM | POA: Diagnosis not present

## 2020-08-28 DIAGNOSIS — M5442 Lumbago with sciatica, left side: Secondary | ICD-10-CM

## 2020-08-28 DIAGNOSIS — M25551 Pain in right hip: Secondary | ICD-10-CM

## 2020-08-28 NOTE — Therapy (Signed)
[place], [place], [address] Phone: [phone]   Fax:  [phone]  Physical Therapy Treatment  Patient Details  Name: Diane Watkins MRN: [id number] Date of Birth: 24-Feb-1951 Referring Provider (PT): Emmit Alexanders   Encounter Date: 08/28/2020   PT End of Session - 08/28/20 1313    Visit Number 6    Number of Visits 12    Date for PT Re-Evaluation 09/20/20    Authorization Type [employer] no auth no V, [employer] no auth or VL    Progress Note Due on Visit 10    PT Start Time 1315    PT Stop Time 1355    PT Time Calculation (min) 40 min    Activity Tolerance Patient tolerated treatment well    Behavior During Therapy [facility] for tasks assessed/performed           Past Medical History:  Diagnosis Date  . Arthritis   . High cholesterol   . Hyperlipidemia   . Hypertension     Past Surgical History:  Procedure Laterality Date  . CATARACT EXTRACTION W/PHACO Left 03/29/2018   Procedure: CATARACT EXTRACTION PHACO AND INTRAOCULAR LENS PLACEMENT (IOC);  Surgeon: [name], MD;  Location: [facility];  Service: Ophthalmology;  Laterality: Left;  CDE: 10.13  . CHOLECYSTECTOMY    . TUBAL LIGATION      There were no vitals filed for this visit.   Subjective Assessment - 08/28/20 1319    Subjective States hip and back are feeling good but does have intermittent pain in the hip with walking long distances. States that her knees are really bothering her and she thinks she needs to follow up with her orthopedic to see if he recommends an injection. States she hasn't been doing her exercises as much as she should because she has been doing a lot of spring cleaning.    Limitations Standing;Walking;House hold activities    How long can you stand comfortably? morning one hour, evenings 10 minutes    Patient Stated Goals to have less pain    Currently in Pain? Yes    Pain Score 3     Pain Location Knee     Pain Orientation Left    Pain Descriptors / Indicators Tightness;Aching    Pain Type Chronic pain    Pain Onset More than a month ago              [facility] PT Assessment - 08/28/20 0001      Assessment   Medical Diagnosis B hip and knee pain    Referring Provider (PT) Emmit Alexanders                         [facility] Adult PT Treatment/Exercise - 08/28/20 0001      Lumbar Exercises: Standing   Heel Raises 10 reps;5 seconds   3 sets bilateral   Other Standing Lumbar Exercises DF with back up against wall 2x15 B    Other Standing Lumbar Exercises chair pose x5 10" holds      Lumbar Exercises: Seated   Long Arc Quad on Chair 10 reps;Both;3 sets    Sit to Stand --   25 reps - elevated surface from black foam   Other Seated Lumbar Exercises lumbar flexion x10 5" holds    Other Seated Lumbar Exercises trunk twists x10 5" holds  PT Education - 08/28/20 1351    Education Details on HEP  and anatomy    Person(s) Educated Patient    Methods Explanation    Comprehension Verbalized understanding            PT Short Term Goals - 08/09/20 1249      PT SHORT TERM GOAL #1   Title Patient will be able to demonstrate at least 50% ROM in all directions of lumbar spine to demonstrate improved functional mobility    Time 3    Period Weeks    Status New    Target Date 08/30/20      PT SHORT TERM GOAL #2   Title Patient will be independent in self management strategies to improve quality of life and functional outcomes.    Time 3    Period Weeks    Status New    Target Date 08/30/20      PT SHORT TERM GOAL #3   Title Patient will report at least 50% improvement in overall symptoms and/or function to demonstrate improved functional mobility    Time 3    Period Weeks    Status New    Target Date 08/30/20             PT Long Term Goals - 08/09/20 1302      PT LONG TERM GOAL #1   Title Patient will be able to stand or walk for at least 30  minutes without onset of pain to demonstrate impoved function.    Time 6    Period Weeks    Status New    Target Date 09/20/20      PT LONG TERM GOAL #2   Title Patient will improve on FOTO score to meet predicted outcomes to demonstrate improved functional mobility.    Time 6    Period Weeks    Status New    Target Date 09/20/20      PT LONG TERM GOAL #3   Title Patient will report at least 75% improvement in overall symptoms and/or function to demonstrate improved functional mobility    Time 6    Period Weeks    Status New    Target Date 09/20/20                 Plan - 08/28/20 1313    Clinical Impression Statement Continued with lower extremity strengthening as tolerated. Fatigue in calves noted with heel raises. Educated patient on importance of strengthening exercises to reduce hyper extension episodes at the knee with patient reports causes her pain. Slight increase in tension in the back of the knee with DF, but this resolved with rest. Discussed with patient continued LE strengthening to work on knee pain as back is feeling much better.    Personal Factors and Comorbidities Age    Examination-Activity Limitations Stairs;Stand;Sit;Locomotion Level;Lift    Examination-Participation Restrictions Cleaning;Community Activity    Stability/Clinical Decision Making Stable/Uncomplicated    Rehab Potential Good    PT Frequency 2x / week    PT Duration 6 weeks    PT Treatment/Interventions ADLs/Self Care Home Management;Aquatic Therapy;Electrical Stimulation;Cryotherapy;Moist Heat;Traction;Balance training;Therapeutic exercise;Therapeutic activities;Functional mobility training;Stair training;Gait training;Neuromuscular re-education;Patient/family education;Manual techniques;Passive range of motion;Dry needling;Joint Manipulations    PT Next Visit Plan lumbar mobility, core and hip strengthening, f/u with left knee pain    PT Home Exercise Plan trunk rotation -seated, 3/1 lumbar  flexion, DKC, SKC, lumbar rotation, bridges; 3/8 elevation and self mobilization with massage stick 3/9 STS,  HR, lateral stepping; 3/15 heel raise    Consulted and Agree with Plan of Care Patient           Patient will benefit from skilled therapeutic intervention in order to improve the following deficits and impairments:  Decreased endurance,Pain,Difficulty walking,Decreased range of motion,Decreased mobility,Decreased strength,Decreased activity tolerance  Visit Diagnosis: Difficulty in walking, not elsewhere classified  Chronic midline low back pain with bilateral sciatica  Pain in left hip  Pain in right hip     Problem List Patient Active Problem List   Diagnosis Date Noted  . Hyperlipidemia 07/09/2019  . COVID-19 virus detected 03/13/2019  . COVID-19 virus infection 03/12/2019  . Fever 03/12/2019  . Primary osteoarthritis of both knees 05/01/2015  . Osteopenia 05/17/2014  . Essential hypertension, benign 05/04/2013  . Hypertriglyceridemia 05/04/2013     1:57 PM, 08/28/20 Jerene Pitch, DPT Physical Therapy with [facility]  [phone] office  [facility] [address] [place], [place], [address] Phone: [phone]   Fax:  [phone]  Name: Diane Watkins MRN: [id number] Date of Birth: 07/17/1950

## 2020-08-30 ENCOUNTER — Encounter (HOSPITAL_COMMUNITY): Payer: Self-pay | Admitting: Physical Therapy

## 2020-08-30 ENCOUNTER — Other Ambulatory Visit: Payer: Self-pay

## 2020-08-30 ENCOUNTER — Ambulatory Visit (HOSPITAL_COMMUNITY): Payer: PPO | Admitting: Physical Therapy

## 2020-08-30 DIAGNOSIS — R262 Difficulty in walking, not elsewhere classified: Secondary | ICD-10-CM

## 2020-08-30 DIAGNOSIS — G8929 Other chronic pain: Secondary | ICD-10-CM

## 2020-08-30 DIAGNOSIS — M25552 Pain in left hip: Secondary | ICD-10-CM

## 2020-08-30 DIAGNOSIS — M25551 Pain in right hip: Secondary | ICD-10-CM

## 2020-08-30 NOTE — Therapy (Signed)
[place] [address] [place], [place], [address] Phone: [phone]   Fax:  [phone]  Physical Therapy Treatment  Patient Details  Name: Diane Watkins MRN: [id number] Date of Birth: February 16, 1951 Referring Provider (PT): Emmit Alexanders   Encounter Date: 08/30/2020   PT End of Session - 08/30/20 1530    Visit Number 7    Number of Visits 12    Date for PT Re-Evaluation 09/20/20    Authorization Type [employer] no auth no V, [employer] no auth or VL    Progress Note Due on Visit 10    PT Start Time 8563    PT Stop Time 1610    PT Time Calculation (min) 40 min    Activity Tolerance Patient tolerated treatment well    Behavior During Therapy [facility] for tasks assessed/performed           Past Medical History:  Diagnosis Date  . Arthritis   . High cholesterol   . Hyperlipidemia   . Hypertension     Past Surgical History:  Procedure Laterality Date  . CATARACT EXTRACTION W/PHACO Left 03/29/2018   Procedure: CATARACT EXTRACTION PHACO AND INTRAOCULAR LENS PLACEMENT (IOC);  Surgeon: [name], MD;  Location: [facility];  Service: Ophthalmology;  Laterality: Left;  CDE: 10.13  . CHOLECYSTECTOMY    . TUBAL LIGATION      There were no vitals filed for this visit.   Subjective Assessment - 08/30/20 1535    Subjective States that her back had a couple of catches with certain ways she turned. States that after last session you were very sore along the muscles but felt better the next day and did not have as much soreness when she did the exercises a second time. Current pain with walking is 1/10 along medial left knee.    Limitations Standing;Walking;House hold activities    How long can you stand comfortably? morning one hour, evenings 10 minutes    Patient Stated Goals to have less pain    Currently in Pain? Yes    Pain Location Knee    Pain Orientation Left;Anterior    Pain Descriptors / Indicators Aching    Pain  Onset More than a month ago              [facility] PT Assessment - 08/30/20 0001      Assessment   Medical Diagnosis B hip and knee pain    Referring Provider (PT) Emmit Alexanders                         [facility] Adult PT Treatment/Exercise - 08/30/20 0001      Exercises   Exercises Knee/Hip      Lumbar Exercises: Stretches   Other Lumbar Stretch Exercise seated lumbar flexion, extension, rotation and side bending x10 each      Lumbar Exercises: Standing   Heel Raises 5 seconds;15 reps   slow lower   Other Standing Lumbar Exercises DF with back up against wall x10 B, 5" holds    Other Standing Lumbar Exercises staggered stance with one leg on step - 4 inch step x2 30" holds B      Lumbar Exercises: Seated   Long Arc Quad on Chair 5 reps;Both;Strengthening   5 sets   LAQ on Chair Weights (lbs) 1# ankle weights- cues to not fully extend knee      Knee/Hip Exercises: Standing   Lateral Step  Up 3 sets;5 reps;Hand Hold: 0;Step Height: 4";Both    Forward Step Up 5 sets;5 reps;Hand Hold: 0;Step Height: 4";Both   focus on not hyperextending                   PT Short Term Goals - 08/09/20 1249      PT SHORT TERM GOAL #1   Title Patient will be able to demonstrate at least 50% ROM in all directions of lumbar spine to demonstrate improved functional mobility    Time 3    Period Weeks    Status New    Target Date 08/30/20      PT SHORT TERM GOAL #2   Title Patient will be independent in self management strategies to improve quality of life and functional outcomes.    Time 3    Period Weeks    Status New    Target Date 08/30/20      PT SHORT TERM GOAL #3   Title Patient will report at least 50% improvement in overall symptoms and/or function to demonstrate improved functional mobility    Time 3    Period Weeks    Status New    Target Date 08/30/20             PT Long Term Goals - 08/09/20 1302      PT LONG TERM GOAL #1   Title Patient will be  able to stand or walk for at least 30 minutes without onset of pain to demonstrate impoved function.    Time 6    Period Weeks    Status New    Target Date 09/20/20      PT LONG TERM GOAL #2   Title Patient will improve on FOTO score to meet predicted outcomes to demonstrate improved functional mobility.    Time 6    Period Weeks    Status New    Target Date 09/20/20      PT LONG TERM GOAL #3   Title Patient will report at least 75% improvement in overall symptoms and/or function to demonstrate improved functional mobility    Time 6    Period Weeks    Status New    Target Date 09/20/20                 Plan - 08/30/20 1530    Clinical Impression Statement Reviewed home exercises and patient performing eccentric portion of heel raise/lowers too quickly, cued for slower lower and patient able to do this well. Increased soreness in muscles noted with exercises but no increase in back or knee pain. Cues to keep knee soft (slightly bent) with standing and straightening exercises. This helped a little bit with pain. Will continue to work on LE strengthening and reducing overall tendency to hyperextend her knees.    Personal Factors and Comorbidities Age    Examination-Activity Limitations Stairs;Stand;Sit;Locomotion Level;Lift    Examination-Participation Restrictions Cleaning;Community Activity    Stability/Clinical Decision Making Stable/Uncomplicated    Rehab Potential Good    PT Frequency 2x / week    PT Duration 6 weeks    PT Treatment/Interventions ADLs/Self Care Home Management;Aquatic Therapy;Electrical Stimulation;Cryotherapy;Moist Heat;Traction;Balance training;Therapeutic exercise;Therapeutic activities;Functional mobility training;Stair training;Gait training;Neuromuscular re-education;Patient/family education;Manual techniques;Passive range of motion;Dry needling;Joint Manipulations    PT Next Visit Plan try step ups at 6" height, LE strengtheing -focus on limiting  hyperextension in knees which causes pain in knees. lumbar mobility, core and hip strengthening, f/u with left knee pain  PT Home Exercise Plan trunk rotation -seated, 3/1 lumbar flexion, DKC, SKC, lumbar rotation, bridges; 3/8 elevation and self mobilization with massage stick 3/9 STS, HR, lateral stepping; 3/15 heel raise    Consulted and Agree with Plan of Care Patient           Patient will benefit from skilled therapeutic intervention in order to improve the following deficits and impairments:  Decreased endurance,Pain,Difficulty walking,Decreased range of motion,Decreased mobility,Decreased strength,Decreased activity tolerance  Visit Diagnosis: Difficulty in walking, not elsewhere classified  Chronic midline low back pain with bilateral sciatica  Pain in left hip  Pain in right hip     Problem List Patient Active Problem List   Diagnosis Date Noted  . Hyperlipidemia 07/09/2019  . COVID-19 virus detected 03/13/2019  . COVID-19 virus infection 03/12/2019  . Fever 03/12/2019  . Primary osteoarthritis of both knees 05/01/2015  . Osteopenia 05/17/2014  . Essential hypertension, benign 05/04/2013  . Hypertriglyceridemia 05/04/2013    4:15 PM, 08/30/20 Jerene Pitch, DPT Physical Therapy with [facility]  [phone] office   [place] [address] [place], [place], [address] Phone: [phone]   Fax:  [phone]  Name: Kyarah Enamorado MRN: [id number] Date of Birth: 1950-08-07

## 2020-09-04 ENCOUNTER — Encounter (HOSPITAL_COMMUNITY): Payer: Self-pay | Admitting: Physical Therapy

## 2020-09-04 ENCOUNTER — Ambulatory Visit (HOSPITAL_COMMUNITY): Payer: PPO | Admitting: Physical Therapy

## 2020-09-04 ENCOUNTER — Other Ambulatory Visit: Payer: Self-pay

## 2020-09-04 DIAGNOSIS — M25552 Pain in left hip: Secondary | ICD-10-CM

## 2020-09-04 DIAGNOSIS — R262 Difficulty in walking, not elsewhere classified: Secondary | ICD-10-CM | POA: Diagnosis not present

## 2020-09-04 DIAGNOSIS — M25551 Pain in right hip: Secondary | ICD-10-CM

## 2020-09-04 DIAGNOSIS — M5442 Lumbago with sciatica, left side: Secondary | ICD-10-CM

## 2020-09-04 DIAGNOSIS — M170 Bilateral primary osteoarthritis of knee: Secondary | ICD-10-CM | POA: Diagnosis not present

## 2020-09-04 DIAGNOSIS — G8929 Other chronic pain: Secondary | ICD-10-CM

## 2020-09-04 NOTE — Patient Instructions (Signed)
Access Code: [email or web address] URL: [email or web address] Date: 09/04/2020 Prepared by: Mitzi Hansen Mahala Rommel  Exercises Standing Anti-Rotation Press with Anchored Resistance - 1 x daily - 7 x weekly - 2 sets - 10 reps

## 2020-09-04 NOTE — Therapy (Signed)
[place], [place], [address] Phone: [phone]   Fax:  [phone]  Physical Therapy Treatment  Patient Details  Name: Diane Watkins MRN: [id number] Date of Birth: Dec 20, 1950 Referring Provider (PT): Emmit Alexanders   Encounter Date: 09/04/2020   PT End of Session - 09/04/20 1001    Visit Number 8    Number of Visits 12    Date for PT Re-Evaluation 09/20/20    Authorization Type [employer] no auth no V, [employer] no auth or VL    Progress Note Due on Visit 10    PT Start Time 1001    PT Stop Time 1040    PT Time Calculation (min) 39 min    Activity Tolerance Patient tolerated treatment well    Behavior During Therapy [facility] for tasks assessed/performed           Past Medical History:  Diagnosis Date  . Arthritis   . High cholesterol   . Hyperlipidemia   . Hypertension     Past Surgical History:  Procedure Laterality Date  . CATARACT EXTRACTION W/PHACO Left 03/29/2018   Procedure: CATARACT EXTRACTION PHACO AND INTRAOCULAR LENS PLACEMENT (IOC);  Surgeon: [name], MD;  Location: [facility];  Service: Ophthalmology;  Laterality: Left;  CDE: 10.13  . CHOLECYSTECTOMY    . TUBAL LIGATION      There were no vitals filed for this visit.   Subjective Assessment - 09/04/20 1002    Subjective Patient thinks her knees are getting better. She has to be careful with her knees so she doesn't tweak it again. Sometimes turning a certain way hurts her knee for a short while.    Limitations Standing;Walking;House hold activities    How long can you stand comfortably? morning one hour, evenings 10 minutes    Patient Stated Goals to have less pain    Currently in Pain? No/denies    Pain Onset More than a month ago                             [facility] Adult PT Treatment/Exercise - 09/04/20 0001      Lumbar Exercises: Standing   Heel Raises 5 seconds;20 reps    Other Standing Lumbar  Exercises palof press in tandem stance 2x10 bilateral      Knee/Hip Exercises: Standing   Forward Step Up Both;10 reps;Hand Hold: 1;Step Height: 6";3 sets                  PT Education - 09/04/20 1002    Education Details HEP, exercise mechanics    Person(s) Educated Patient    Methods Explanation;Demonstration    Comprehension Verbalized understanding;Returned demonstration            PT Short Term Goals - 08/09/20 1249      PT SHORT TERM GOAL #1   Title Patient will be able to demonstrate at least 50% ROM in all directions of lumbar spine to demonstrate improved functional mobility    Time 3    Period Weeks    Status New    Target Date 08/30/20      PT SHORT TERM GOAL #2   Title Patient will be independent in self management strategies to improve quality of life and functional outcomes.    Time 3    Period Weeks    Status New    Target Date 08/30/20  PT SHORT TERM GOAL #3   Title Patient will report at least 50% improvement in overall symptoms and/or function to demonstrate improved functional mobility    Time 3    Period Weeks    Status New    Target Date 08/30/20             PT Long Term Goals - 08/09/20 1302      PT LONG TERM GOAL #1   Title Patient will be able to stand or walk for at least 30 minutes without onset of pain to demonstrate impoved function.    Time 6    Period Weeks    Status New    Target Date 09/20/20      PT LONG TERM GOAL #2   Title Patient will improve on FOTO score to meet predicted outcomes to demonstrate improved functional mobility.    Time 6    Period Weeks    Status New    Target Date 09/20/20      PT LONG TERM GOAL #3   Title Patient will report at least 75% improvement in overall symptoms and/or function to demonstrate improved functional mobility    Time 6    Period Weeks    Status New    Target Date 09/20/20                 Plan - 09/04/20 1001    Clinical Impression Statement Patient able to  progress to 6 inch step today for step up exercise with cueing to avoid hyperextension and requires unilateral UE support for balance and impaired strength. Patient given intermittent cueing for sequencing with stair exercises. Patient with min/mod unsteadiness with palof in tandem and is shown how to perform at home. Patient will continue to benefit from skilled physical therapy in order to reduce impairment and improve function.    Personal Factors and Comorbidities Age    Examination-Activity Limitations Stairs;Stand;Sit;Locomotion Level;Lift    Examination-Participation Restrictions Cleaning;Community Activity    Stability/Clinical Decision Making Stable/Uncomplicated    Rehab Potential Good    PT Frequency 2x / week    PT Duration 6 weeks    PT Treatment/Interventions ADLs/Self Care Home Management;Aquatic Therapy;Electrical Stimulation;Cryotherapy;Moist Heat;Traction;Balance training;Therapeutic exercise;Therapeutic activities;Functional mobility training;Stair training;Gait training;Neuromuscular re-education;Patient/family education;Manual techniques;Passive range of motion;Dry needling;Joint Manipulations    PT Next Visit Plan LE strengtheing -focus on limiting hyperextension in knees which causes pain in knees. lumbar mobility, core and hip strengthening, f/u with left knee pain    PT Home Exercise Plan trunk rotation -seated, 3/1 lumbar flexion, DKC, SKC, lumbar rotation, bridges; 3/8 elevation and self mobilization with massage stick 3/9 STS, HR, lateral stepping; 3/15 heel raise    Consulted and Agree with Plan of Care Patient           Patient will benefit from skilled therapeutic intervention in order to improve the following deficits and impairments:  Decreased endurance,Pain,Difficulty walking,Decreased range of motion,Decreased mobility,Decreased strength,Decreased activity tolerance  Visit Diagnosis: Difficulty in walking, not elsewhere classified  Chronic midline low back  pain with bilateral sciatica  Pain in left hip  Pain in right hip     Problem List Patient Active Problem List   Diagnosis Date Noted  . Hyperlipidemia 07/09/2019  . COVID-19 virus detected 03/13/2019  . COVID-19 virus infection 03/12/2019  . Fever 03/12/2019  . Primary osteoarthritis of both knees 05/01/2015  . Osteopenia 05/17/2014  . Essential hypertension, benign 05/04/2013  . Hypertriglyceridemia 05/04/2013    10:43 AM, 09/04/20 Mitzi Hansen  Juanita Laster PT, DPT Physical Therapist at [place], [place], [address] Phone: [phone]   Fax:  [phone]  Name: Diane Watkins MRN: [id number] Date of Birth: [birth year]

## 2020-09-06 ENCOUNTER — Other Ambulatory Visit: Payer: Self-pay

## 2020-09-06 ENCOUNTER — Encounter (HOSPITAL_COMMUNITY): Payer: Self-pay | Admitting: Physical Therapy

## 2020-09-06 ENCOUNTER — Ambulatory Visit (HOSPITAL_COMMUNITY): Payer: PPO | Admitting: Physical Therapy

## 2020-09-06 DIAGNOSIS — G8929 Other chronic pain: Secondary | ICD-10-CM

## 2020-09-06 DIAGNOSIS — M25551 Pain in right hip: Secondary | ICD-10-CM

## 2020-09-06 DIAGNOSIS — R262 Difficulty in walking, not elsewhere classified: Secondary | ICD-10-CM | POA: Diagnosis not present

## 2020-09-06 DIAGNOSIS — M25552 Pain in left hip: Secondary | ICD-10-CM

## 2020-09-06 NOTE — Therapy (Signed)
[facility] [address] [place], [place], [address] Phone: [phone]   Fax:  [phone]  Physical Therapy Treatment and Progress Note  Patient Details  Name: LOVELLA HARDIE MRN: [id number] Date of Birth: [birth year] Referring Provider (PT): Emmit Alexanders   Progress Note Reporting Period 08/09/20 to 09/06/20  See note below for Objective Data and Assessment of Progress/Goals.       Encounter Date: 09/06/2020   PT End of Session - 09/06/20 1535    Visit Number 9    Number of Visits 12    Date for PT Re-Evaluation 09/20/20    Authorization Type [employer] no auth no V, [employer] no auth or VL    Progress Note Due on Visit 19    PT Start Time 1535   pt late to session   PT Stop Time 1613    PT Time Calculation (min) 38 min    Activity Tolerance Patient tolerated treatment well    Behavior During Therapy WFL for tasks assessed/performed           Past Medical History:  Diagnosis Date  . Arthritis   . High cholesterol   . Hyperlipidemia   . Hypertension     Past Surgical History:  Procedure Laterality Date  . CATARACT EXTRACTION W/PHACO Left 03/29/2018   Procedure: CATARACT EXTRACTION PHACO AND INTRAOCULAR LENS PLACEMENT (IOC);  Surgeon: [name], MD;  Location: [facility];  Service: Ophthalmology;  Laterality: Left;  CDE: 10.13  . CHOLECYSTECTOMY    . TUBAL LIGATION      There were no vitals filed for this visit.   Subjective Assessment - 09/06/20 1540    Subjective States she got injections in both knees and her MD said to not go on long walks anymore secondary to the severe bone on bone. States that her knees feels pretty good but that her left ankle is a little sore. States she has been working in the yard everyday.  States that she occasionally has discomfort in her back but she does her exercises and her back feels better. States overall she feels 25% better since the start of PT.    Limitations  Standing;Walking;House hold activities    How long can you stand comfortably? morning one hour, evenings 10 minutes    Patient Stated Goals to have less pain    Currently in Pain? Yes    Pain Score 0-No pain   discomfort in left ankle.   Pain Location Ankle    Pain Orientation Left    Pain Onset More than a month ago              [facility] PT Assessment - 09/06/20 0001      Assessment   Medical Diagnosis B hip and knee pain    Referring Provider (PT) Crolyn C Hoskins      Observation/Other Assessments   Focus on Therapeutic Outcomes (FOTO)  49% function (predicted 51% funciton)      AROM   Right Hip Flexion 120   feels good   Right Hip External Rotation  50   feels good   Right Hip Internal Rotation  30   feels good   Left Hip Flexion 120   feels good   Left Hip External Rotation  50   feels good   Left Hip Internal Rotation  30   feels good   Lumbar Flexion 25% limited   stretching   Lumbar Extension 25% limited  feels good   Lumbar - Right Side Bend 25% limited    Lumbar - Left Side Bend 25%limited    Lumbar - Right Rotation 25% limited    Lumbar - Left Rotation 25% limited      Strength   Right Hip Flexion 4+/5    Right Hip Extension 3+/5    Left Hip Flexion 4+/5    Left Hip Extension 4-/5    Right Knee Flexion 4-/5    Right Knee Extension 4/5    Left Knee Flexion 4/5    Left Knee Extension 4/5    Right Ankle Dorsiflexion 5/5    Right Ankle Plantar Flexion 2+/5    Left Ankle Dorsiflexion 5/5    Left Ankle Plantar Flexion 2/5                         [facility] Adult PT Treatment/Exercise - 09/06/20 0001      Lumbar Exercises: Stretches   Other Lumbar Stretch Exercise seated lumbar flexion, extension, rotation and side bending x5 each 5" holds      Lumbar Exercises: Standing   Heel Raises 5 seconds;20 reps    Other Standing Lumbar Exercises tandem static on floor bare foot x2 30" holds Bilateral, --> then with head turns 3x5 Bilateral  and in each  position      Knee/Hip Exercises: Supine   Straight Leg Raises 10 reps;AROM;Strengthening;Both;2 sets                  PT Education - 09/06/20 1605    Education Details FOTO score, on progress made, focus of PT and POC moving forward    Person(s) Educated Patient    Methods Explanation    Comprehension Verbalized understanding            PT Short Term Goals - 09/06/20 1555      PT SHORT TERM GOAL #1   Title Patient will be able to demonstrate at least 50% ROM in all directions of lumbar spine to demonstrate improved functional mobility    Time 3    Period Weeks    Status Achieved    Target Date 08/30/20      PT SHORT TERM GOAL #2   Title Patient will be independent in self management strategies to improve quality of life and functional outcomes.    Time 3    Period Weeks    Status On-going    Target Date 08/30/20      PT SHORT TERM GOAL #3   Title Patient will report at least 50% improvement in overall symptoms and/or function to demonstrate improved functional mobility    Time 3    Period Weeks    Status On-going    Target Date 08/30/20             PT Long Term Goals - 09/06/20 1555      PT LONG TERM GOAL #1   Title Patient will be able to stand or walk for at least 30 minutes without onset of pain to demonstrate impoved function.    Baseline 30 minutes on level ground    Time 6    Period Weeks    Status Achieved      PT LONG TERM GOAL #2   Title Patient will improve on FOTO score to meet predicted outcomes to demonstrate improved functional mobility.    Time 6    Period Weeks    Status On-going  PT LONG TERM GOAL #3   Title Patient will report at least 75% improvement in overall symptoms and/or function to demonstrate improved functional mobility    Time 6    Period Weeks    Status On-going                 Plan - 09/06/20 1603    Clinical Impression Statement Patient present for a progress note on this date. She has made good  progress towards her goals and has met 1 of 3 short term goals and 1 of 3 long term goals. Reviewed HEP and current presentation. Discussed focusing more on dynamic balance and total body exercises that target core and lower legs. Will continue with current POC as tolerated by patient.    Personal Factors and Comorbidities Age    Examination-Activity Limitations Stairs;Stand;Sit;Locomotion Level;Lift    Examination-Participation Restrictions Cleaning;Community Activity    Stability/Clinical Decision Making Stable/Uncomplicated    Rehab Potential Good    PT Frequency 2x / week    PT Duration 6 weeks    PT Treatment/Interventions ADLs/Self Care Home Management;Aquatic Therapy;Electrical Stimulation;Cryotherapy;Moist Heat;Traction;Balance training;Therapeutic exercise;Therapeutic activities;Functional mobility training;Stair training;Gait training;Neuromuscular re-education;Patient/family education;Manual techniques;Passive range of motion;Dry needling;Joint Manipulations    PT Next Visit Plan LE strengtheing -focus on limiting hyperextension in knees which causes pain in knees. dynamic balance where core is challenged, total body exercises    PT Home Exercise Plan trunk rotation -seated, 3/1 lumbar flexion, DKC, SKC, lumbar rotation, bridges; 3/8 elevation and self mobilization with massage stick 3/9 STS, HR, lateral stepping; 3/15 heel raise;    Consulted and Agree with Plan of Care Patient           Patient will benefit from skilled therapeutic intervention in order to improve the following deficits and impairments:  Decreased endurance,Pain,Difficulty walking,Decreased range of motion,Decreased mobility,Decreased strength,Decreased activity tolerance  Visit Diagnosis: Difficulty in walking, not elsewhere classified  Chronic midline low back pain with bilateral sciatica  Pain in left hip  Pain in right hip     Problem List Patient Active Problem List   Diagnosis Date Noted  .  Hyperlipidemia 07/09/2019  . COVID-19 virus detected 03/13/2019  . COVID-19 virus infection 03/12/2019  . Fever 03/12/2019  . Primary osteoarthritis of both knees 05/01/2015  . Osteopenia 05/17/2014  . Essential hypertension, benign 05/04/2013  . Hypertriglyceridemia 05/04/2013    4:13 PM, 09/06/20 Jerene Pitch, DPT Physical Therapy with [facility]  [phone] office  [facility] [address] [place], [place], [address] Phone: [phone]   Fax:  [phone]  Name: SHAELYN DECARLI MRN: [id number] Date of Birth: 25-Apr-1951

## 2020-09-11 ENCOUNTER — Ambulatory Visit (HOSPITAL_COMMUNITY): Payer: PPO | Admitting: Physical Therapy

## 2020-09-11 ENCOUNTER — Encounter (HOSPITAL_COMMUNITY): Payer: Self-pay | Admitting: Physical Therapy

## 2020-09-11 ENCOUNTER — Other Ambulatory Visit: Payer: Self-pay

## 2020-09-11 DIAGNOSIS — R262 Difficulty in walking, not elsewhere classified: Secondary | ICD-10-CM | POA: Diagnosis not present

## 2020-09-11 DIAGNOSIS — G8929 Other chronic pain: Secondary | ICD-10-CM

## 2020-09-11 DIAGNOSIS — M25552 Pain in left hip: Secondary | ICD-10-CM

## 2020-09-11 DIAGNOSIS — M25551 Pain in right hip: Secondary | ICD-10-CM

## 2020-09-11 NOTE — Patient Instructions (Signed)
Access Code: [email or web address] URL: [email or web address] Date: 09/11/2020 Prepared by: Yetta Glassman  Exercises Clamshell - 3 sets - 10 reps

## 2020-09-11 NOTE — Therapy (Signed)
[facility] [address] [place], [place], [address] Phone: [phone]   Fax:  [phone]  Physical Therapy Treatment  Patient Details  Name: RAMONICA GRIGG MRN: [id number] Date of Birth: 1950/08/19 Referring Provider (PT): Emmit Alexanders   Encounter Date: 09/11/2020   PT End of Session - 09/11/20 1401    Visit Number 10    Number of Visits 12    Date for PT Re-Evaluation 09/20/20    Authorization Type [employer] no auth no V, [employer] no auth or VL    Progress Note Due on Visit 19    PT Start Time 1402    PT Stop Time 1440    PT Time Calculation (min) 38 min    Activity Tolerance Patient tolerated treatment well    Behavior During Therapy [facility] for tasks assessed/performed           Past Medical History:  Diagnosis Date  . Arthritis   . High cholesterol   . Hyperlipidemia   . Hypertension     Past Surgical History:  Procedure Laterality Date  . CATARACT EXTRACTION W/PHACO Left 03/29/2018   Procedure: CATARACT EXTRACTION PHACO AND INTRAOCULAR LENS PLACEMENT (IOC);  Surgeon: [name], MD;  Location: [facility];  Service: Ophthalmology;  Laterality: Left;  CDE: 10.13  . CHOLECYSTECTOMY    . TUBAL LIGATION      There were no vitals filed for this visit.   Subjective Assessment - 09/11/20 1408    Subjective States that she hasn't been doing her exercises as she has been feeling depressed thinking about things in the past. States that her knees feel weak. State her back feels good but she hasn't been doing anything. Reports no current pain just the odd weakness feeling in her knees.    Limitations Standing;Walking;House hold activities    How long can you stand comfortably? morning one hour, evenings 10 minutes    Patient Stated Goals to have less pain    Currently in Pain? No/denies    Pain Onset More than a month ago              [facility] PT Assessment - 09/11/20 0001      Assessment   Medical Diagnosis B  hip and knee pain    Referring Provider (PT) Emmit Alexanders                         [facility] Adult PT Treatment/Exercise - 09/11/20 0001      Lumbar Exercises: Stretches   Other Lumbar Stretch Exercise seated lumbar flexion, extension, rotation and side bending x15 each 5" holds      Lumbar Exercises: Supine   Bridge 5 seconds;10 reps   3 sets   Straight Leg Raise 10 reps;2 seconds   3 sets, bilateral     Lumbar Exercises: Sidelying   Clam Both;10 reps;2 seconds   3 sets                 PT Education - 09/11/20 1410    Education Details Educated patient on benefits of talking to counselor about coping strategies for depression, encouraged patient to follow up with MD.    Terence Lux) Educated Patient    Methods Explanation    Comprehension Verbalized understanding            PT Short Term Goals - 09/06/20 1555      PT SHORT TERM GOAL #1   Title  Patient will be able to demonstrate at least 50% ROM in all directions of lumbar spine to demonstrate improved functional mobility    Time 3    Period Weeks    Status Achieved    Target Date 08/30/20      PT SHORT TERM GOAL #2   Title Patient will be independent in self management strategies to improve quality of life and functional outcomes.    Time 3    Period Weeks    Status On-going    Target Date 08/30/20      PT SHORT TERM GOAL #3   Title Patient will report at least 50% improvement in overall symptoms and/or function to demonstrate improved functional mobility    Time 3    Period Weeks    Status On-going    Target Date 08/30/20             PT Long Term Goals - 09/06/20 1555      PT LONG TERM GOAL #1   Title Patient will be able to stand or walk for at least 30 minutes without onset of pain to demonstrate impoved function.    Baseline 30 minutes on level ground    Time 6    Period Weeks    Status Achieved      PT LONG TERM GOAL #2   Title Patient will improve on FOTO score to meet  predicted outcomes to demonstrate improved functional mobility.    Time 6    Period Weeks    Status On-going      PT LONG TERM GOAL #3   Title Patient will report at least 75% improvement in overall symptoms and/or function to demonstrate improved functional mobility    Time 6    Period Weeks    Status On-going                 Plan - 09/11/20 1435    Clinical Impression Statement Educated patient on benefits of talking to counselor about coping strategies for depression, encouraged patient to follow up with MD. Patient reports she likes the laying down exercises as they feel good but that she doesn't like to lay on the floor. Educated patient in using bed to perform supine exercises in bed or on couch. Reviewed exercises and added clamshell to HEP. answered all questions end of session, patient reporting soreness but no pain.    Personal Factors and Comorbidities Age    Examination-Activity Limitations Stairs;Stand;Sit;Locomotion Level;Lift    Examination-Participation Restrictions Cleaning;Community Activity    Stability/Clinical Decision Making Stable/Uncomplicated    Rehab Potential Good    PT Frequency 2x / week    PT Duration 6 weeks    PT Treatment/Interventions ADLs/Self Care Home Management;Aquatic Therapy;Electrical Stimulation;Cryotherapy;Moist Heat;Traction;Balance training;Therapeutic exercise;Therapeutic activities;Functional mobility training;Stair training;Gait training;Neuromuscular re-education;Patient/family education;Manual techniques;Passive range of motion;Dry needling;Joint Manipulations    PT Next Visit Plan LE strengtheing -focus on limiting hyperextension in knees which causes pain in knees. dynamic balance where core is challenged, total body exercises    PT Home Exercise Plan trunk rotation -seated, 3/1 lumbar flexion, DKC, SKC, lumbar rotation, bridges; 3/8 elevation and self mobilization with massage stick 3/9 STS, HR, lateral stepping; 3/15 heel raise;  3/29 clamshell    Consulted and Agree with Plan of Care Patient           Patient will benefit from skilled therapeutic intervention in order to improve the following deficits and impairments:  Decreased endurance,Pain,Difficulty walking,Decreased range of motion,Decreased mobility,Decreased strength,Decreased activity  tolerance  Visit Diagnosis: Difficulty in walking, not elsewhere classified  Pain in left hip  Chronic midline low back pain with bilateral sciatica  Pain in right hip     Problem List Patient Active Problem List   Diagnosis Date Noted  . Hyperlipidemia 07/09/2019  . COVID-19 virus detected 03/13/2019  . COVID-19 virus infection 03/12/2019  . Fever 03/12/2019  . Primary osteoarthritis of both knees 05/01/2015  . Osteopenia 05/17/2014  . Essential hypertension, benign 05/04/2013  . Hypertriglyceridemia 05/04/2013  2:40 PM, 09/11/20 Jerene Pitch, DPT Physical Therapy with [facility]  [phone] office  [facility] [address] [place], [place], [address] Phone: [phone]   Fax:  [phone]  Name: QUIDA GLASSER MRN: [id number] Date of Birth: Dec 25, 1950

## 2020-09-13 ENCOUNTER — Ambulatory Visit (HOSPITAL_COMMUNITY): Payer: PPO | Admitting: Physical Therapy

## 2020-09-13 ENCOUNTER — Encounter (HOSPITAL_COMMUNITY): Payer: Self-pay | Admitting: Physical Therapy

## 2020-09-13 ENCOUNTER — Other Ambulatory Visit: Payer: Self-pay

## 2020-09-13 DIAGNOSIS — G8929 Other chronic pain: Secondary | ICD-10-CM

## 2020-09-13 DIAGNOSIS — M25551 Pain in right hip: Secondary | ICD-10-CM

## 2020-09-13 DIAGNOSIS — M25552 Pain in left hip: Secondary | ICD-10-CM

## 2020-09-13 DIAGNOSIS — R262 Difficulty in walking, not elsewhere classified: Secondary | ICD-10-CM | POA: Diagnosis not present

## 2020-09-13 IMAGING — CR DG CHEST 1V PORT
1 series · 1 of 1 positions shown · non-contrast
Comparison: None.

CLINICAL DATA: Shortness of breath.  Recent QMR0E-RY diagnosis.

EXAM:
PORTABLE CHEST 1 VIEW

[portable]
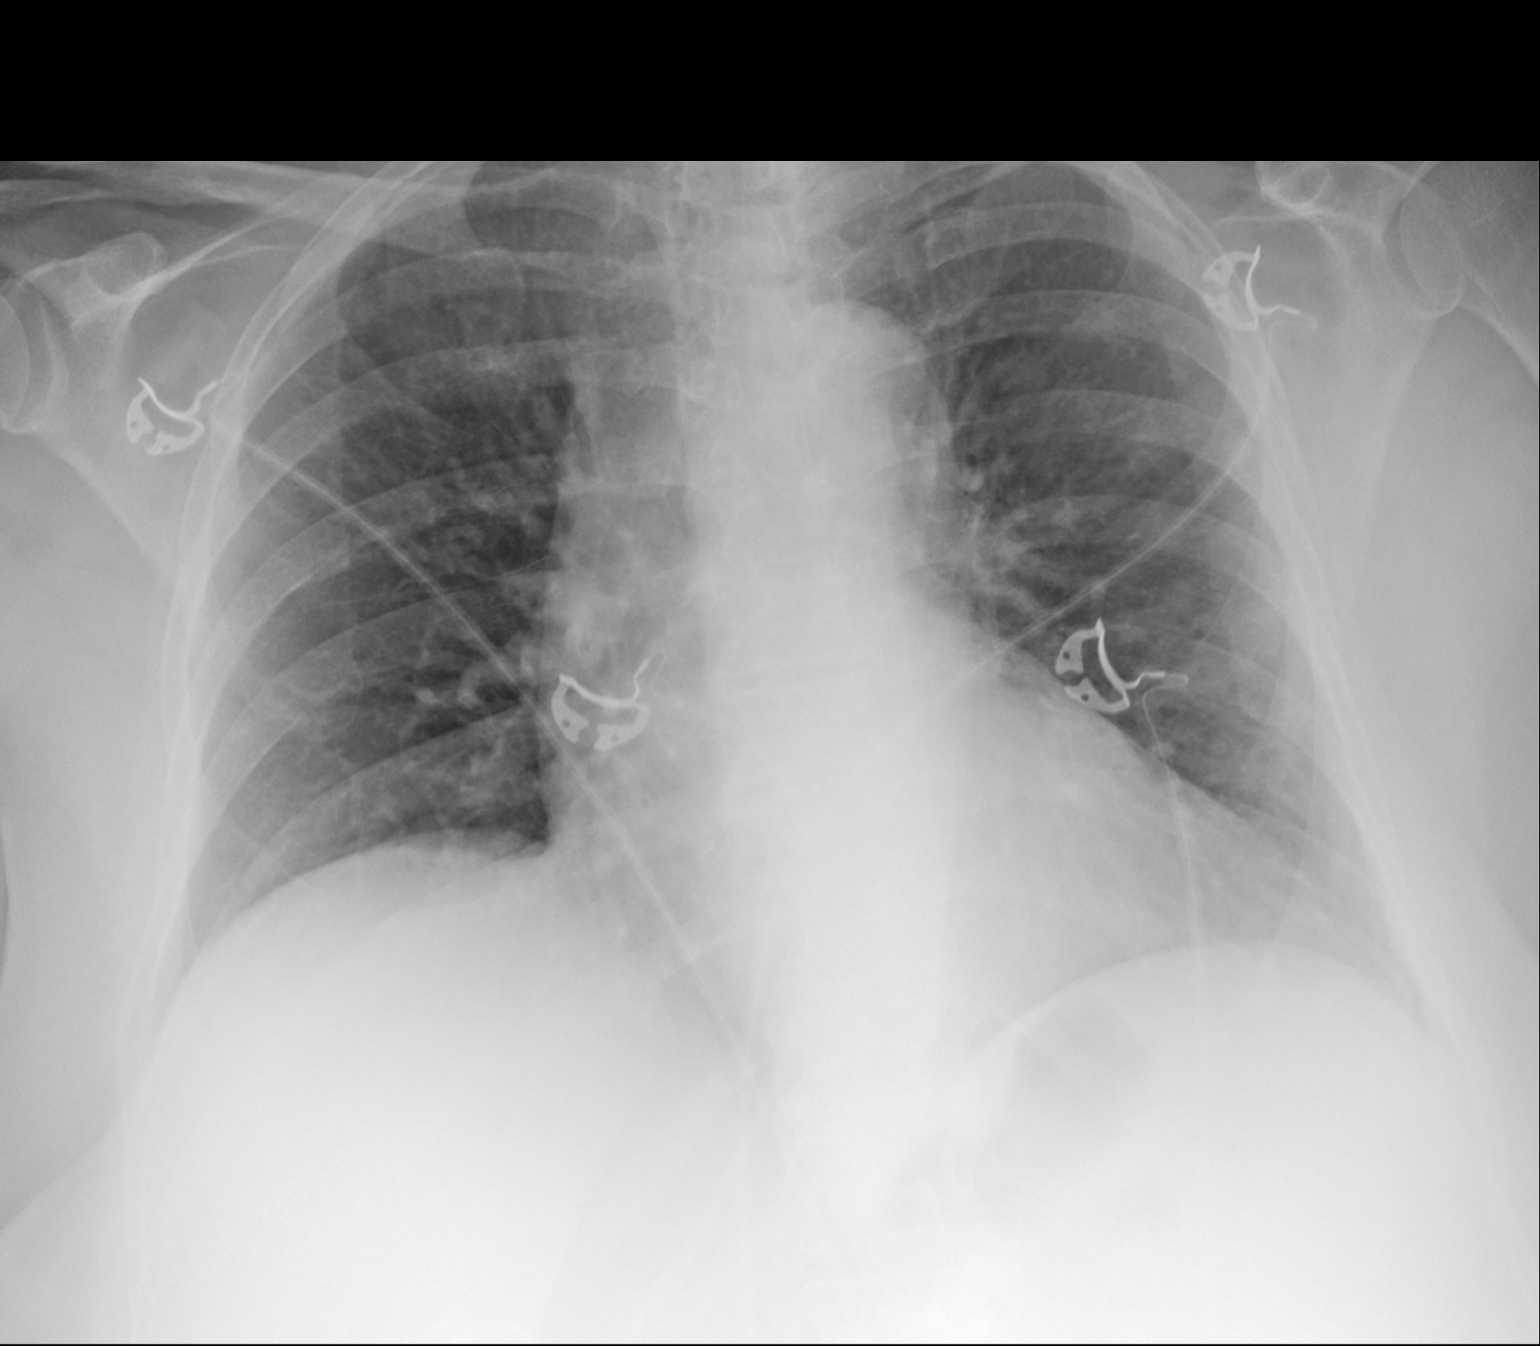

[1 of 1 positions shown; findings below may reference images not displayed]

FINDINGS: The heart, hila, and mediastinum are normal. Right lung is clear.
There may be minimal opacity in the periphery of the left lung which
is not definitive. No other abnormalities.
IMPRESSION: There may be minimal opacity in the periphery of the left lung which
is not definitive. No other abnormalities.

## 2020-09-13 NOTE — Therapy (Signed)
[facility] [address] [place], [place], [address] Phone: [phone]   Fax:  [phone]  Physical Therapy Treatment  Patient Details  Name: Diane Watkins MRN: [id number] Date of Birth: Jul 01, 1950 Referring Provider (PT): Emmit Alexanders   Encounter Date: 09/13/2020   PT End of Session - 09/13/20 1530    Visit Number 11    Number of Visits 12    Date for PT Re-Evaluation 09/20/20    Authorization Type [employer] no auth no V, [employer] no auth or VL    Progress Note Due on Visit 19    PT Start Time 0354    PT Stop Time 1610    PT Time Calculation (min) 40 min    Activity Tolerance Patient tolerated treatment well    Behavior During Therapy [facility] for tasks assessed/performed           Past Medical History:  Diagnosis Date  . Arthritis   . High cholesterol   . Hyperlipidemia   . Hypertension     Past Surgical History:  Procedure Laterality Date  . CATARACT EXTRACTION W/PHACO Left 03/29/2018   Procedure: CATARACT EXTRACTION PHACO AND INTRAOCULAR LENS PLACEMENT (IOC);  Surgeon: [name], MD;  Location: [facility];  Service: Ophthalmology;  Laterality: Left;  CDE: 10.13  . CHOLECYSTECTOMY    . TUBAL LIGATION      There were no vitals filed for this visit.   Subjective Assessment - 09/13/20 1531    Subjective The exercises she did last time made her hips sore and they are still sore. Patient still has problems with walking.    Limitations Standing;Walking;House hold activities    How long can you stand comfortably? morning one hour, evenings 10 minutes    Patient Stated Goals to have less pain    Currently in Pain? No/denies    Pain Onset More than a month ago                             [facility] Adult PT Treatment/Exercise - 09/13/20 0001      Lumbar Exercises: Supine   Bridge 5 seconds;10 reps    Bridge Limitations 2 sets    Straight Leg Raise 10 reps;2 seconds      Knee/Hip Exercises:  Standing   SLS 2x 30 seconds bilateral    SLS with Vectors 5x 5 second holds bilateral    Other Standing Knee Exercises tandem stance 2x 30 seconds on foam      Knee/Hip Exercises: Seated   Long Arc Quad Both;1 set;10 reps    Long Arc Quad Weight 2 lbs.    Long [employer] Limitations 3 second holds                  PT Education - 09/13/20 1531    Education Details HEP, exercise mechanics, beginning a walking program    Person(s) Educated Patient    Methods Explanation;Demonstration    Comprehension Verbalized understanding;Returned demonstration            PT Short Term Goals - 09/06/20 1555      PT SHORT TERM GOAL #1   Title Patient will be able to demonstrate at least 50% ROM in all directions of lumbar spine to demonstrate improved functional mobility    Time 3    Period Weeks    Status Achieved    Target Date 08/30/20  PT SHORT TERM GOAL #2   Title Patient will be independent in self management strategies to improve quality of life and functional outcomes.    Time 3    Period Weeks    Status On-going    Target Date 08/30/20      PT SHORT TERM GOAL #3   Title Patient will report at least 50% improvement in overall symptoms and/or function to demonstrate improved functional mobility    Time 3    Period Weeks    Status On-going    Target Date 08/30/20             PT Long Term Goals - 09/06/20 1555      PT LONG TERM GOAL #1   Title Patient will be able to stand or walk for at least 30 minutes without onset of pain to demonstrate impoved function.    Baseline 30 minutes on level ground    Time 6    Period Weeks    Status Achieved      PT LONG TERM GOAL #2   Title Patient will improve on FOTO score to meet predicted outcomes to demonstrate improved functional mobility.    Time 6    Period Weeks    Status On-going      PT LONG TERM GOAL #3   Title Patient will report at least 75% improvement in overall symptoms and/or function to demonstrate  improved functional mobility    Time 6    Period Weeks    Status On-going                 Plan - 09/13/20 1530    Clinical Impression Statement Patient educated on beginning a walking program to improve endurance and walking tolerance. Patient completes hip and core strengthening exercises without c/o increase in symptoms. Patient states continued difficulty with balance so continued with static and dynamic balance training. She requires intermittent HHA with balance exercises. Discussed POC with patient and anticipate d/c next session. Patient will continue to benefit from skilled physical therapy in order to reduce impairment and improve function.    Personal Factors and Comorbidities Age    Examination-Activity Limitations Stairs;Stand;Sit;Locomotion Level;Lift    Examination-Participation Restrictions Cleaning;Community Activity    Stability/Clinical Decision Making Stable/Uncomplicated    Rehab Potential Good    PT Frequency 2x / week    PT Duration 6 weeks    PT Treatment/Interventions ADLs/Self Care Home Management;Aquatic Therapy;Electrical Stimulation;Cryotherapy;Moist Heat;Traction;Balance training;Therapeutic exercise;Therapeutic activities;Functional mobility training;Stair training;Gait training;Neuromuscular re-education;Patient/family education;Manual techniques;Passive range of motion;Dry needling;Joint Manipulations    PT Next Visit Plan LE strengtheing -focus on limiting hyperextension in knees which causes pain in knees. dynamic balance where core is challenged, total body exercises; anticipate d/c    PT Home Exercise Plan trunk rotation -seated, 3/1 lumbar flexion, DKC, SKC, lumbar rotation, bridges; 3/8 elevation and self mobilization with massage stick 3/9 STS, HR, lateral stepping; 3/15 heel raise; 3/29 clamshell    Consulted and Agree with Plan of Care Patient           Patient will benefit from skilled therapeutic intervention in order to improve the following  deficits and impairments:  Decreased endurance,Pain,Difficulty walking,Decreased range of motion,Decreased mobility,Decreased strength,Decreased activity tolerance  Visit Diagnosis: Difficulty in walking, not elsewhere classified  Pain in left hip  Chronic midline low back pain with bilateral sciatica  Pain in right hip     Problem List Patient Active Problem List   Diagnosis Date Noted  . Hyperlipidemia 07/09/2019  .  COVID-19 virus detected 03/13/2019  . COVID-19 virus infection 03/12/2019  . Fever 03/12/2019  . Primary osteoarthritis of both knees 05/01/2015  . Osteopenia 05/17/2014  . Essential hypertension, benign 05/04/2013  . Hypertriglyceridemia 05/04/2013    4:13 PM, 09/13/20 Mearl Latin PT, DPT Physical Therapist at [facility] [place], [place], [address] Phone: [phone]   Fax:  [phone]  Name: TEANNA ELEM MRN: [id number] Date of Birth: 10-20-1950

## 2020-09-18 ENCOUNTER — Other Ambulatory Visit: Payer: Self-pay

## 2020-09-18 ENCOUNTER — Encounter (HOSPITAL_COMMUNITY): Payer: Self-pay | Admitting: Physical Therapy

## 2020-09-18 ENCOUNTER — Ambulatory Visit (HOSPITAL_COMMUNITY): Payer: PPO | Attending: Nurse Practitioner | Admitting: Physical Therapy

## 2020-09-18 DIAGNOSIS — M25551 Pain in right hip: Secondary | ICD-10-CM | POA: Diagnosis not present

## 2020-09-18 DIAGNOSIS — G8929 Other chronic pain: Secondary | ICD-10-CM | POA: Insufficient documentation

## 2020-09-18 DIAGNOSIS — R262 Difficulty in walking, not elsewhere classified: Secondary | ICD-10-CM | POA: Insufficient documentation

## 2020-09-18 DIAGNOSIS — M5442 Lumbago with sciatica, left side: Secondary | ICD-10-CM | POA: Insufficient documentation

## 2020-09-18 DIAGNOSIS — M25552 Pain in left hip: Secondary | ICD-10-CM | POA: Insufficient documentation

## 2020-09-18 DIAGNOSIS — M5441 Lumbago with sciatica, right side: Secondary | ICD-10-CM | POA: Insufficient documentation

## 2020-09-18 NOTE — Therapy (Signed)
[facility] [address] [place], [place], [address] Phone: [phone]   Fax:  [phone]  Physical Therapy Treatment and Discharge Note  Patient Details  Name: Diane Watkins MRN: [id number] Date of Birth: 1950/07/23 Referring Provider (PT): Emmit Alexanders   Encounter Date: 09/18/2020  PHYSICAL THERAPY DISCHARGE SUMMARY  Visits from Start of Care: 12  Current functional level related to goals / functional outcomes: See below   Remaining deficits: See below   Education / Equipment: See below  Plan: Patient agrees to discharge.  Patient goals were partially met. Patient is being discharged due to being pleased with the current functional level.  ?????       PT End of Session - 09/18/20 1314    Visit Number 12    Number of Visits 12    Date for PT Re-Evaluation 09/20/20    Authorization Type [employer] no auth no V, [employer] no auth or VL    Progress Note Due on Visit 19    PT Start Time 1315    PT Stop Time 1350    PT Time Calculation (min) 35 min    Activity Tolerance Patient tolerated treatment well    Behavior During Therapy WFL for tasks assessed/performed           Past Medical History:  Diagnosis Date  . Arthritis   . High cholesterol   . Hyperlipidemia   . Hypertension     Past Surgical History:  Procedure Laterality Date  . CATARACT EXTRACTION W/PHACO Left 03/29/2018   Procedure: CATARACT EXTRACTION PHACO AND INTRAOCULAR LENS PLACEMENT (IOC);  Surgeon: [name], MD;  Location: [facility];  Service: Ophthalmology;  Laterality: Left;  CDE: 10.13  . CHOLECYSTECTOMY    . TUBAL LIGATION      There were no vitals filed for this visit.   Subjective Assessment - 09/18/20 1323    Subjective Overall she feels about 55% better. States that she hasn't exercised much because her daughter and granddaughter have been visiting. States that her hip pain is still there especially getting up and down with  the baby.    Limitations Standing;Walking;House hold activities    How long can you stand comfortably? morning one hour, evenings 10 minutes    Patient Stated Goals to have less pain    Currently in Pain? No/denies    Pain Onset More than a month ago              [facility] PT Assessment - 09/18/20 0001      Assessment   Medical Diagnosis B hip and knee pain    Referring Provider (PT) Crolyn C Hoskins      Observation/Other Assessments   Focus on Therapeutic Outcomes (FOTO)  49% function (predicted 51% funciton)      AROM   Lumbar Flexion 25% limited    Lumbar Extension 25% limited   feels good   Lumbar - Right Side Bend 0% limited    Lumbar - Left Side Bend 0%limited    Lumbar - Right Rotation 25% limited    Lumbar - Left Rotation 25% limited      Strength   Right Hip Flexion 4+/5    Right Hip Extension 3+/5    Left Hip Flexion 4+/5    Left Hip Extension 4-/5    Right Knee Flexion 4/5    Right Knee Extension 4+/5    Left Knee Flexion 4/5    Left Knee Extension 4+/5  Right Ankle Dorsiflexion 5/5    Right Ankle Plantar Flexion 2+/5    Left Ankle Dorsiflexion 5/5    Left Ankle Plantar Flexion 2+/5                         [facility] Adult PT Treatment/Exercise - 09/18/20 0001      Lumbar Exercises: Stretches   Other Lumbar Stretch Exercise seated lumbar flexion, extension, rotation and side bending x5 each 5" holds                  PT Education - 09/18/20 1334    Education Details on HEP, on external cues, on incorporating exercises into routine, on FOTO score. on strategies to work towards weight loss goals.    Person(s) Educated Patient    Methods Explanation    Comprehension Verbalized understanding            PT Short Term Goals - 09/18/20 1323      PT SHORT TERM GOAL #1   Title Patient will be able to demonstrate at least 50% ROM in all directions of lumbar spine to demonstrate improved functional mobility    Time 3    Period Weeks     Status Achieved    Target Date 08/30/20      PT SHORT TERM GOAL #2   Title Patient will be independent in self management strategies to improve quality of life and functional outcomes.    Time 3    Period Weeks    Status On-going    Target Date 08/30/20      PT SHORT TERM GOAL #3   Title Patient will report at least 50% improvement in overall symptoms and/or function to demonstrate improved functional mobility    Time 3    Period Weeks    Status Achieved    Target Date 08/30/20             PT Long Term Goals - 09/18/20 1336      PT LONG TERM GOAL #1   Title Patient will be able to stand or walk for at least 30 minutes without onset of pain to demonstrate impoved function.    Baseline 30 minutes on level ground    Time 6    Period Weeks    Status Achieved      PT LONG TERM GOAL #2   Title Patient will improve on FOTO score to meet predicted outcomes to demonstrate improved functional mobility.    Time 6    Period Weeks    Status Achieved      PT LONG TERM GOAL #3   Title Patient will report at least 75% improvement in overall symptoms and/or function to demonstrate improved functional mobility    Time 6    Period Weeks    Status On-going                 Plan - 09/18/20 1355    Clinical Impression Statement Overall patient has met 2 of 3 short and long term goal and independent in HEP but needs continued external cues to help with adherence to HEP. Focused on education and developing strategies to continue to work towards goals and improving overall function and quality of life. Answered all questions. Patient to discharge from PT to HEP at this time secondary to progress made while in therapy.    Personal Factors and Comorbidities Age    Examination-Activity Limitations Stairs;Stand;Sit;Locomotion Level;Lift  Examination-Participation Restrictions Cleaning;Community Activity    Stability/Clinical Decision Making Stable/Uncomplicated    Rehab Potential  Good    PT Frequency 2x / week    PT Duration 6 weeks    PT Treatment/Interventions ADLs/Self Care Home Management;Aquatic Therapy;Electrical Stimulation;Cryotherapy;Moist Heat;Traction;Balance training;Therapeutic exercise;Therapeutic activities;Functional mobility training;Stair training;Gait training;Neuromuscular re-education;Patient/family education;Manual techniques;Passive range of motion;Dry needling;Joint Manipulations    PT Next Visit Plan DC to HEP    PT Home Exercise Plan trunk rotation -seated, 3/1 lumbar flexion, DKC, SKC, lumbar rotation, bridges; 3/8 elevation and self mobilization with massage stick 3/9 STS, HR, lateral stepping; 3/15 heel raise; 3/29 clamshell    Consulted and Agree with Plan of Care Patient           Patient will benefit from skilled therapeutic intervention in order to improve the following deficits and impairments:  Decreased endurance,Pain,Difficulty walking,Decreased range of motion,Decreased mobility,Decreased strength,Decreased activity tolerance  Visit Diagnosis: Difficulty in walking, not elsewhere classified  Pain in left hip  Pain in right hip  Chronic midline low back pain with bilateral sciatica     Problem List Patient Active Problem List   Diagnosis Date Noted  . Hyperlipidemia 07/09/2019  . COVID-19 virus detected 03/13/2019  . COVID-19 virus infection 03/12/2019  . Fever 03/12/2019  . Primary osteoarthritis of both knees 05/01/2015  . Osteopenia 05/17/2014  . Essential hypertension, benign 05/04/2013  . Hypertriglyceridemia 05/04/2013   1:56 PM, 09/18/20 Jerene Pitch, DPT Physical Therapy with [facility]  [phone] office  [facility] [address] [place], [place], [address] Phone: [phone]   Fax:  [phone]  Name: Diane Watkins MRN: [id number] Date of Birth: September 10, 1950

## 2020-09-20 ENCOUNTER — Encounter (HOSPITAL_COMMUNITY): Payer: PPO | Admitting: Physical Therapy

## 2020-09-23 ENCOUNTER — Other Ambulatory Visit: Payer: Self-pay | Admitting: Nurse Practitioner

## 2020-09-26 ENCOUNTER — Other Ambulatory Visit: Payer: Self-pay

## 2020-09-26 ENCOUNTER — Ambulatory Visit (INDEPENDENT_AMBULATORY_CARE_PROVIDER_SITE_OTHER): Payer: PPO | Admitting: Family Medicine

## 2020-09-26 ENCOUNTER — Encounter: Payer: Self-pay | Admitting: Family Medicine

## 2020-09-26 VITALS — BP 123/79 | HR 111 | Temp 93.2°F | Ht 62.0 in | Wt 199.2 lb

## 2020-09-26 DIAGNOSIS — N950 Postmenopausal bleeding: Secondary | ICD-10-CM | POA: Diagnosis not present

## 2020-09-26 DIAGNOSIS — N841 Polyp of cervix uteri: Secondary | ICD-10-CM | POA: Diagnosis not present

## 2020-09-26 DIAGNOSIS — R1031 Right lower quadrant pain: Secondary | ICD-10-CM

## 2020-09-26 NOTE — Progress Notes (Signed)
Patient ID: Diane Watkins, female    DOB: 18-Feb-1951, 70 y.o.   MRN: [id number]   Chief Complaint  Patient presents with  . Abdominal Cramping  . postmenopausal bleeding   Subjective:    HPI   Pt has been having right sides abdominal cramps and into lower abdominal area.  Pinkish spotting. Has not had to wear a pad. Just enough to when she wipes she can see it. Sometimes just colorless discharge. 4th or 5th day.   Lower abd cramping and radiation to the back and occ up to ruq.  Feeling like a "period cramping" Still has all female organs.  Noticing spotting.  Noticing started 4-5 days ago.  Pink coloring discharge from vaginal area. Noticing more in vaginal area.  Not seeing blood in toilet.  Seeing small amt pink color with wiping. Not pain with urination or vaginal area. Feb '22 blood count normal.  Pt had been a while since pap. No history of abnormal pap.  Pain scale 4/10- rt lower abd pain. Not needing to take pain meds. No vomiting, fever, or severe tenderness in rt lower abd.  Medical History Diane Watkins has a past medical history of Arthritis, High cholesterol, Hyperlipidemia, and Hypertension.   Outpatient Encounter Medications as of 09/26/2020  Medication Sig  . aspirin 81 MG tablet Take 81 mg by mouth at bedtime.   . diphenhydrAMINE (BENADRYL) 25 MG tablet Take 25 mg by mouth daily as needed for sleep.  Marland Kitchen escitalopram (LEXAPRO) 10 MG tablet Take 1 tablet (10 mg total) by mouth daily.  . hydrochlorothiazide (HYDRODIURIL) 25 MG tablet Take 1 tablet (25 mg total) by mouth daily.  Marland Kitchen ibuprofen (ADVIL,MOTRIN) 200 MG tablet Take 400 mg by mouth daily as needed for mild pain or moderate pain.   Marland Kitchen lisinopril (ZESTRIL) 20 MG tablet TAKE 1 TABLET(20 MG) BY MOUTH DAILY  . naproxen (NAPROSYN) 375 MG tablet Take 1 tablet (375 mg total) by mouth 2 (two) times daily with a meal. PRN pain  . omega-3 acid ethyl esters (LOVAZA) 1 g capsule TAKE 2 CAPSULES BY MOUTH TWICE DAILY   . Polyethyl Glycol-Propyl Glycol 0.4-0.3 % SOLN Apply 1-2 drops to eye daily as needed (FOR DRY EYE RELIEF).  Marland Kitchen rosuvastatin (CRESTOR) 20 MG tablet TAKE 1 TABLET(20 MG) BY MOUTH DAILY FOR CHOLESTEROL   No facility-administered encounter medications on file as of 09/26/2020.     Review of Systems  Constitutional: Negative for chills and fever.  HENT: Negative for congestion, rhinorrhea and sore throat.   Respiratory: Negative for cough, shortness of breath and wheezing.   Cardiovascular: Negative for chest pain and leg swelling.  Gastrointestinal: Negative for abdominal pain (rt lower quadrant, intermittent), diarrhea, nausea and vomiting.  Genitourinary: Negative for dysuria and frequency.  Musculoskeletal: Negative for arthralgias and back pain.  Skin: Negative for rash.  Neurological: Negative for dizziness, weakness and headaches.     Vitals BP 123/79   Pulse (!) 111   Temp (!) 93.2 F (34 C)   Ht 5\' 2"  (1.575 m)   Wt 199 lb 3.2 oz (90.4 kg)   SpO2 94%   BMI 36.43 kg/m   Objective:   Physical Exam Vitals and nursing note reviewed.  Constitutional:      Appearance: Normal appearance.  HENT:     Head: Normocephalic and atraumatic.     Nose: Nose normal.     Mouth/Throat:     Mouth: Mucous membranes are moist.     Pharynx:  Oropharynx is clear.  Eyes:     Extraocular Movements: Extraocular movements intact.     Conjunctiva/sclera: Conjunctivae normal.     Pupils: Pupils are equal, round, and reactive to light.  Cardiovascular:     Rate and Rhythm: Regular rhythm. Tachycardia present.     Pulses: Normal pulses.     Heart sounds: Normal heart sounds.  Pulmonary:     Effort: Pulmonary effort is normal. No respiratory distress.     Breath sounds: Normal breath sounds. No wheezing, rhonchi or rales.  Abdominal:     General: Abdomen is flat. Bowel sounds are normal. There is no distension.     Palpations: Abdomen is soft. There is no mass.     Tenderness: There is  abdominal tenderness (mild in rt lower quadrant). There is no guarding or rebound.     Hernia: No hernia is present.  Genitourinary:    General: Normal vulva.     Vagina: No vaginal discharge.     Comments: +0.5cm cervial polyp at cervical os. No bleeding or discharge in vagina. No CMT. No other vaginal lesions. Musculoskeletal:        General: Normal range of motion.     Right lower leg: No edema.     Left lower leg: No edema.  Skin:    General: Skin is warm and dry.     Findings: No lesion or rash.  Neurological:     General: No focal deficit present.     Mental Status: She is alert and oriented to person, place, and time.  Psychiatric:        Mood and Affect: Mood normal.        Behavior: Behavior normal.      Assessment and Plan   1. Postmenopausal bleeding - Ambulatory referral to Obstetrics / Gynecology - IGP, Aptima HPV  2. Right lower quadrant pain - Ambulatory referral to Obstetrics / Gynecology  3. Cervical polyp - Ambulatory referral to Obstetrics / Gynecology - IGP, Aptima HPV   Call back if severe bleeding, pain, fever, or more pan in rt lower quadrant. Tylenol or naproxen safe to take prn.  Likely bleeding from cervical polyp, gave reassurance that needing to f/u with gyn for removal and further evaluation of post-menopausal bleeding. If pian in rt lower quad persisting or getting fever, vomiting or diarrhea to call back.  Return if symptoms worsen or fail to improve.

## 2020-09-27 ENCOUNTER — Ambulatory Visit
Admission: EM | Admit: 2020-09-27 | Discharge: 2020-09-27 | Disposition: A | Discharge status: Home / Self Care | Payer: PPO

## 2020-09-27 ENCOUNTER — Emergency Department (HOSPITAL_BASED_OUTPATIENT_CLINIC_OR_DEPARTMENT_OTHER): Payer: PPO

## 2020-09-27 ENCOUNTER — Encounter (HOSPITAL_BASED_OUTPATIENT_CLINIC_OR_DEPARTMENT_OTHER): Payer: Self-pay | Admitting: Emergency Medicine

## 2020-09-27 ENCOUNTER — Telehealth: Payer: Self-pay | Admitting: *Deleted

## 2020-09-27 ENCOUNTER — Other Ambulatory Visit: Payer: Self-pay

## 2020-09-27 ENCOUNTER — Emergency Department (HOSPITAL_BASED_OUTPATIENT_CLINIC_OR_DEPARTMENT_OTHER)
Admission: EM | Admit: 2020-09-27 | Discharge: 2020-09-27 | Disposition: A | Discharge status: Home / Self Care | Payer: PPO | Attending: Emergency Medicine | Admitting: Emergency Medicine

## 2020-09-27 DIAGNOSIS — I10 Essential (primary) hypertension: Secondary | ICD-10-CM | POA: Diagnosis not present

## 2020-09-27 DIAGNOSIS — K5730 Diverticulosis of large intestine without perforation or abscess without bleeding: Secondary | ICD-10-CM | POA: Diagnosis not present

## 2020-09-27 DIAGNOSIS — Z7982 Long term (current) use of aspirin: Secondary | ICD-10-CM | POA: Insufficient documentation

## 2020-09-27 DIAGNOSIS — Z79899 Other long term (current) drug therapy: Secondary | ICD-10-CM | POA: Insufficient documentation

## 2020-09-27 DIAGNOSIS — R109 Unspecified abdominal pain: Secondary | ICD-10-CM

## 2020-09-27 DIAGNOSIS — R87610 Atypical squamous cells of undetermined significance on cytologic smear of cervix (ASC-US): Secondary | ICD-10-CM | POA: Diagnosis not present

## 2020-09-27 DIAGNOSIS — R1011 Right upper quadrant pain: Secondary | ICD-10-CM | POA: Diagnosis not present

## 2020-09-27 DIAGNOSIS — Z8616 Personal history of COVID-19: Secondary | ICD-10-CM | POA: Insufficient documentation

## 2020-09-27 DIAGNOSIS — N841 Polyp of cervix uteri: Secondary | ICD-10-CM | POA: Diagnosis not present

## 2020-09-27 DIAGNOSIS — K439 Ventral hernia without obstruction or gangrene: Secondary | ICD-10-CM | POA: Insufficient documentation

## 2020-09-27 DIAGNOSIS — N950 Postmenopausal bleeding: Secondary | ICD-10-CM | POA: Diagnosis not present

## 2020-09-27 LAB — CBC WITH DIFFERENTIAL/PLATELET
Abs Immature Granulocytes: 0.03 10*3/uL (ref 0.00–0.07)
Basophils Absolute: 0 10*3/uL (ref 0.0–0.1)
Basophils Relative: 0 %
Eosinophils Absolute: 0 10*3/uL (ref 0.0–0.5)
Eosinophils Relative: 0 %
HCT: 42.1 % (ref 36.0–46.0)
Hemoglobin: 14.4 g/dL (ref 12.0–15.0)
Immature Granulocytes: 0 %
Lymphocytes Relative: 25 %
Lymphs Abs: 2.4 10*3/uL (ref 0.7–4.0)
MCH: 31.4 pg (ref 26.0–34.0)
MCHC: 34.2 g/dL (ref 30.0–36.0)
MCV: 91.9 fL (ref 80.0–100.0)
Monocytes Absolute: 0.7 10*3/uL (ref 0.1–1.0)
Monocytes Relative: 7 %
Neutro Abs: 6.3 10*3/uL (ref 1.7–7.7)
Neutrophils Relative %: 68 %
Platelets: 266 10*3/uL (ref 150–400)
RBC: 4.58 MIL/uL (ref 3.87–5.11)
RDW: 13.3 % (ref 11.5–15.5)
WBC: 9.4 10*3/uL (ref 4.0–10.5)
nRBC: 0 % (ref 0.0–0.2)

## 2020-09-27 LAB — COMPREHENSIVE METABOLIC PANEL
ALT: 29 U/L (ref 0–44)
AST: 27 U/L (ref 15–41)
Albumin: 4.6 g/dL (ref 3.5–5.0)
Alkaline Phosphatase: 44 U/L (ref 38–126)
Anion gap: 11 (ref 5–15)
BUN: 20 mg/dL (ref 8–23)
CO2: 25 mmol/L (ref 22–32)
Calcium: 9.8 mg/dL (ref 8.9–10.3)
Chloride: 102 mmol/L (ref 98–111)
Creatinine, Ser: 1.02 mg/dL — ABNORMAL HIGH (ref 0.44–1.00)
GFR, Estimated: >60 mL/min (ref >60)
Glucose, Bld: 95 mg/dL (ref 70–99)
Potassium: 4.2 mmol/L (ref 3.5–5.1)
Sodium: 138 mmol/L (ref 135–145)
Total Bilirubin: 0.6 mg/dL (ref 0.3–1.2)
Total Protein: 7.1 g/dL (ref 6.5–8.1)

## 2020-09-27 LAB — URINALYSIS, ROUTINE W REFLEX MICROSCOPIC
Bilirubin Urine: NEGATIVE
Glucose, UA: NEGATIVE mg/dL
Ketones, ur: 40 mg/dL — AB
Nitrite: NEGATIVE
Specific Gravity, Urine: 1.024 (ref 1.005–1.030)
pH: 5.5 (ref 5.0–8.0)

## 2020-09-27 LAB — LIPASE, BLOOD: Lipase: 43 U/L (ref 11–51)

## 2020-09-27 MED ORDER — MORPHINE SULFATE (PF) 2 MG/ML IV SOLN
2.0000 mg | Freq: Once | INTRAVENOUS | Status: AC
  Administered 2020-09-27: 2 mg via INTRAVENOUS
  Filled 2020-09-27: qty 1

## 2020-09-27 MED ORDER — SODIUM CHLORIDE 0.9 % IV BOLUS
1000.0000 mL | Freq: Once | INTRAVENOUS | Status: AC
  Administered 2020-09-27: 1000 mL via INTRAVENOUS

## 2020-09-27 MED ORDER — IOHEXOL 300 MG/ML  SOLN
100.0000 mL | Freq: Once | INTRAMUSCULAR | Status: AC | PRN
  Administered 2020-09-27: 100 mL via INTRAVENOUS

## 2020-09-27 MED ORDER — ONDANSETRON HCL 4 MG/2ML IJ SOLN
4.0000 mg | Freq: Once | INTRAMUSCULAR | Status: AC
  Administered 2020-09-27: 4 mg via INTRAVENOUS
  Filled 2020-09-27: qty 2

## 2020-09-27 NOTE — Telephone Encounter (Signed)
Pt called and states she started having swelling on her right side and is tender to the touch. Not able to eat today. No appetite. No nausea, no vomiting, no diarrhea, no fever. States tenderness has started to go around to right side of back. Advised pt to go to urgent care and she agreed to go today.

## 2020-09-27 NOTE — ED Triage Notes (Signed)
Pt states she has decreased appetite and feels like there is something in her stomach.  Reports losing 7 lbs in the past month  Without trying.

## 2020-09-27 NOTE — Discharge Instructions (Signed)
Return for worsening pain, fever, inability to eat or drink

## 2020-09-27 NOTE — ED Provider Notes (Signed)
[facility] EMERGENCY DEPT Provider Note   CSN: [id number] Arrival date & time: 09/27/20  1631     History Chief Complaint  Patient presents with  . Abdominal Pain    Diane Watkins is a 70 y.o. female.  71 yo F with a chief complaint of right-sided abdominal pain.  Initially was lower now and somewhat higher.  She saw her family doctor yesterday who thought that it was due to postmenopausal bleeding.  They thought it was likely secondary to a cervical polyp that they found on exam.  Pain is worse with eating seems to have some bloating with it.  Has a prior cholecystectomy.  No fevers or chills.  Pain is been going on for a few weeks.  The history is provided by the patient.  Abdominal Pain Pain location:  RUQ Pain quality: aching and cramping   Pain radiates to:  Does not radiate Pain severity:  Moderate Onset quality:  Gradual Duration:  2 weeks Timing:  Intermittent Progression:  Waxing and waning Chronicity:  New Relieved by:  Nothing Worsened by:  Nothing Ineffective treatments:  None tried Associated symptoms: vaginal bleeding (spotting, resolved)   Associated symptoms: no chest pain, no chills, no dysuria, no fever, no nausea, no shortness of breath and no vomiting        Past Medical History:  Diagnosis Date  . Arthritis   . High cholesterol   . Hyperlipidemia   . Hypertension     Patient Active Problem List   Diagnosis Date Noted  . Hyperlipidemia 07/09/2019  . COVID-19 virus detected 03/13/2019  . COVID-19 virus infection 03/12/2019  . Fever 03/12/2019  . Primary osteoarthritis of both knees 05/01/2015  . Osteopenia 05/17/2014  . Essential hypertension, benign 05/04/2013  . Hypertriglyceridemia 05/04/2013    Past Surgical History:  Procedure Laterality Date  . CATARACT EXTRACTION W/PHACO Left 03/29/2018   Procedure: CATARACT EXTRACTION PHACO AND INTRAOCULAR LENS PLACEMENT (IOC);  Surgeon: [name], MD;  Location: [facility];   Service: Ophthalmology;  Laterality: Left;  CDE: 10.13  . CHOLECYSTECTOMY    . TUBAL LIGATION       OB History    Gravida      Para      Term      Preterm      AB      Living  2     SAB      IAB      Ectopic      Multiple      Live Births              Family History  Problem Relation Age of Onset  . Stroke Mother   . Alcohol abuse Father   . Heart disease Father 51       MI    Social History   Tobacco Use  . Smoking status: Never Smoker  . Smokeless tobacco: Never Used  Vaping Use  . Vaping Use: Never used  Substance Use Topics  . Alcohol use: No  . Drug use: No    Home Medications Prior to Admission medications   Medication Sig Start Date End Date Taking? Authorizing Provider  aspirin 81 MG tablet Take 81 mg by mouth at bedtime.     [provider]  diphenhydrAMINE (BENADRYL) 25 MG tablet Take 25 mg by mouth daily as needed for sleep.    [provider]  escitalopram (LEXAPRO) 10 MG tablet Take 1 tablet (10 mg total) by mouth daily.  07/27/20 07/27/21  Nilda Simmer, NP  hydrochlorothiazide (HYDRODIURIL) 25 MG tablet Take 1 tablet (25 mg total) by mouth daily. 01/06/20   Nilda Simmer, NP  ibuprofen (ADVIL,MOTRIN) 200 MG tablet Take 400 mg by mouth daily as needed for mild pain or moderate pain.     [provider]  lisinopril (ZESTRIL) 20 MG tablet TAKE 1 TABLET(20 MG) BY MOUTH DAILY 09/26/20   Nilda Simmer, NP  naproxen (NAPROSYN) 375 MG tablet Take 1 tablet (375 mg total) by mouth 2 (two) times daily with a meal. PRN pain 07/27/20   Nilda Simmer, NP  omega-3 acid ethyl esters (LOVAZA) 1 g capsule TAKE 2 CAPSULES BY MOUTH TWICE DAILY 06/22/20   Nilda Simmer, NP  Polyethyl Glycol-Propyl Glycol 0.4-0.3 % SOLN Apply 1-2 drops to eye daily as needed (FOR DRY EYE RELIEF).    [provider]  rosuvastatin (CRESTOR) 20 MG tablet TAKE 1 TABLET(20 MG) BY MOUTH DAILY FOR CHOLESTEROL 09/26/20   Nilda Simmer, NP    Allergies    Amoxil [amoxicillin]  Review of Systems   Review of Systems  Constitutional: Negative for chills and fever.  HENT: Negative for congestion and rhinorrhea.   Eyes: Negative for redness and visual disturbance.  Respiratory: Negative for shortness of breath and wheezing.   Cardiovascular: Negative for chest pain and palpitations.  Gastrointestinal: Positive for abdominal pain. Negative for nausea and vomiting.  Genitourinary: Positive for vaginal bleeding (spotting, resolved). Negative for dysuria and urgency.  Musculoskeletal: Negative for arthralgias and myalgias.  Skin: Negative for pallor and wound.  Neurological: Negative for dizziness and headaches.    Physical Exam Updated Vital Signs BP (!) 147/72 (BP Location: Left Arm)   Pulse (!) 106   Temp 98.8 F (37.1 C) (Oral)   Resp 18   Ht 5\' 2"  (1.575 m)   Wt 90.3 kg   SpO2 96%   BMI 36.40 kg/m   Physical Exam Vitals and nursing note reviewed.  Constitutional:      General: She is not in acute distress.    Appearance: She is well-developed. She is not diaphoretic.     Comments: BMI 36  HENT:     Head: Normocephalic and atraumatic.  Eyes:     Pupils: Pupils are equal, round, and reactive to light.  Cardiovascular:     Rate and Rhythm: Normal rate and regular rhythm.     Heart sounds: No murmur heard. No friction rub. No gallop.   Pulmonary:     Effort: Pulmonary effort is normal.     Breath sounds: No wheezing or rales.  Abdominal:     General: There is no distension.     Palpations: Abdomen is soft.     Tenderness: There is no abdominal tenderness.     Comments: Benign abdominal exam.  Musculoskeletal:        General: No tenderness.     Cervical back: Normal range of motion and neck supple.  Skin:    General: Skin is warm and dry.  Neurological:     Mental Status: She is alert and oriented to person, place, and time.  Psychiatric:        Behavior: Behavior normal.     ED  Results / Procedures / Treatments   Labs (all labs ordered are listed, but only abnormal results are displayed) Labs Reviewed  COMPREHENSIVE METABOLIC PANEL - Abnormal; Notable for the following components:      Result Value  Creatinine, Ser 1.02 (*)    All other components within normal limits  URINALYSIS, ROUTINE W REFLEX MICROSCOPIC - Abnormal; Notable for the following components:   Hgb urine dipstick MODERATE (*)    Ketones, ur 40 (*)    Protein, ur TRACE (*)    Leukocytes,Ua LARGE (*)    Bacteria, UA RARE (*)    All other components within normal limits  CBC WITH DIFFERENTIAL/PLATELET  LIPASE, BLOOD    EKG None  Radiology CT ABDOMEN PELVIS W CONTRAST  Result Date: 09/27/2020 CLINICAL DATA:  Right lower quadrant cramping times several days. EXAM: CT ABDOMEN AND PELVIS WITH CONTRAST TECHNIQUE: Multidetector CT imaging of the abdomen and pelvis was performed using the standard protocol following bolus administration of intravenous contrast. CONTRAST:  186mL OMNIPAQUE IOHEXOL 300 MG/ML  SOLN COMPARISON:  None. FINDINGS: Lower chest: No acute abnormality. Hepatobiliary: No focal liver abnormality is seen. Status post cholecystectomy. No biliary dilatation. Pancreas: Unremarkable. No pancreatic ductal dilatation or surrounding inflammatory changes. Spleen: Normal in size without focal abnormality. Adrenals/Urinary Tract: Adrenal glands are unremarkable. Kidneys are normal, without renal calculi, focal lesion, or hydronephrosis. Bladder is unremarkable. Stomach/Bowel: Stomach is within normal limits. Appendix appears normal. No evidence of bowel wall thickening, distention, or inflammatory changes. Noninflamed diverticula are seen throughout the sigmoid colon. Vascular/Lymphatic: Aortic atherosclerosis. No enlarged abdominal or pelvic lymph nodes. Reproductive: Uterus and bilateral adnexa are unremarkable. Other: A 5.9 cm x 2.9 cm fat containing ventral hernia is seen along the midline of  the mid abdomen. No abdominopelvic ascites. Musculoskeletal: Degenerative changes seen at the levels of L4-L5 and L5-S1. IMPRESSION: 1. Evidence of prior cholecystectomy. 2. 5.9 cm x 2.9 cm fat containing ventral hernia. 3. Sigmoid diverticulosis. 4. Aortic atherosclerosis. Aortic Atherosclerosis (ICD10-I70.0). Electronically Signed   By: Virgina Norfolk M.D.   On: 09/27/2020 20:08    Procedures Procedures   Medications Ordered in ED Medications  morphine 2 MG/ML injection 2 mg (2 mg Intravenous Given 09/27/20 1840)  ondansetron (ZOFRAN) injection 4 mg (4 mg Intravenous Given 09/27/20 1840)  sodium chloride 0.9 % bolus 1,000 mL (0 mLs Intravenous Stopped 09/27/20 1949)  iohexol (OMNIPAQUE) 300 MG/ML solution 100 mL (100 mLs Intravenous Contrast Given 09/27/20 1954)    ED Course  I have reviewed the triage vital signs and the nursing notes.  Pertinent labs & imaging results that were available during my care of the patient were reviewed by me and considered in my medical decision making (see chart for details).    MDM Rules/Calculators/A&P                          70 yo F with a chief complaint of right-sided abdominal pain.  Going on for a few weeks now.  No significant abdominal pain on exam.  Saw her family doctor yesterday.  Obtain a CT scan.  Blood work.  Reassess.  Blood work is unremarkable LFTs and lipase are normal.  CT scan without acute pathology.  There is a ventral hernia with fat containing.  Repalpated without pain.  Patient again points to more of the right flank where I think the hernia would be unlikely to cause her symptoms.  Follow-up with her family doctor.  9:44 PM:  I have discussed the diagnosis/risks/treatment options with the patient and believe the pt to be eligible for discharge home to follow-up with PCP. We also discussed returning to the ED immediately if new or worsening sx occur. We discussed  the sx which are most concerning (e.g., sudden worsening pain,  fever, inability to tolerate by mouth) that necessitate immediate return. Medications administered to the patient during their visit and any new prescriptions provided to the patient are listed below.  Medications given during this visit Medications  morphine 2 MG/ML injection 2 mg (2 mg Intravenous Given 09/27/20 1840)  ondansetron (ZOFRAN) injection 4 mg (4 mg Intravenous Given 09/27/20 1840)  sodium chloride 0.9 % bolus 1,000 mL (0 mLs Intravenous Stopped 09/27/20 1949)  iohexol (OMNIPAQUE) 300 MG/ML solution 100 mL (100 mLs Intravenous Contrast Given 09/27/20 1954)     The patient appears reasonably screen and/or stabilized for discharge and I doubt any other medical condition or other [facility] requiring further screening, evaluation, or treatment in the ED at this time prior to discharge.     Final Clinical Impression(s) / ED Diagnoses Final diagnoses:  Right sided abdominal pain    Rx / DC Orders ED Discharge Orders    None       Deno Etienne, DO 09/27/20 2144

## 2020-09-27 NOTE — ED Notes (Signed)
Patient is being discharged from the Urgent Care and sent to the Emergency Department via pov . Per [place] patient is in need of higher level of care due to abd pain. Patient is aware and verbalizes understanding of plan of care.  Vitals:   09/27/20 1549  BP: (!) 165/111  Pulse: (!) 110  Resp: 19  Temp: 98.4 F (36.9 C)  SpO2: 98%

## 2020-09-27 NOTE — ED Triage Notes (Signed)
RLQ cramping for several days. Pain has worsened today. Saw her dr yesterday and was told to take naproxen.

## 2020-10-02 LAB — IGP, APTIMA HPV: HPV Aptima: NEGATIVE

## 2020-10-10 ENCOUNTER — Other Ambulatory Visit: Payer: Self-pay | Admitting: Family Medicine

## 2020-10-10 DIAGNOSIS — Z1211 Encounter for screening for malignant neoplasm of colon: Secondary | ICD-10-CM

## 2020-10-16 ENCOUNTER — Encounter (INDEPENDENT_AMBULATORY_CARE_PROVIDER_SITE_OTHER): Payer: Self-pay | Admitting: *Deleted

## 2020-10-16 NOTE — Telephone Encounter (Signed)
error 

## 2020-10-17 ENCOUNTER — Encounter: Payer: Self-pay | Admitting: Obstetrics & Gynecology

## 2020-10-17 ENCOUNTER — Other Ambulatory Visit: Payer: Self-pay

## 2020-10-17 ENCOUNTER — Other Ambulatory Visit (HOSPITAL_COMMUNITY)
Admission: RE | Admit: 2020-10-17 | Discharge: 2020-10-17 | Disposition: A | Discharge status: Home / Self Care | Payer: PPO | Source: Ambulatory Visit | Attending: Obstetrics & Gynecology | Admitting: Obstetrics & Gynecology

## 2020-10-17 ENCOUNTER — Ambulatory Visit (INDEPENDENT_AMBULATORY_CARE_PROVIDER_SITE_OTHER): Payer: PPO | Admitting: Obstetrics & Gynecology

## 2020-10-17 VITALS — BP 158/86 | HR 90 | Ht 62.0 in | Wt 198.0 lb

## 2020-10-17 DIAGNOSIS — N841 Polyp of cervix uteri: Secondary | ICD-10-CM | POA: Insufficient documentation

## 2020-10-17 DIAGNOSIS — N950 Postmenopausal bleeding: Secondary | ICD-10-CM

## 2020-10-17 DIAGNOSIS — N888 Other specified noninflammatory disorders of cervix uteri: Secondary | ICD-10-CM | POA: Diagnosis not present

## 2020-10-17 NOTE — Progress Notes (Signed)
GYN VISIT Patient name: Diane Watkins MRN [id number]  Date of birth: 1951/06/06 Chief Complaint:   Post Menopausal Bleeding (Cervical polyp)  History of Present Illness:   Diane Watkins is a 70 y.o. [phone] postmenopausal female being seen today for the following concerns:  PMB: Seen by PCP and noted about 2 weeks of vaginal spotting.  Notes bleeding was pink and only a spot, not required a pad.  Noted when she went to the bathroom..  On exam small cervical polyp was noted.   Additionally, pap smear was completed that showed ASCUS, HPV negative..     No LMP recorded. Patient is postmenopausal.  Depression screen [facility] 2/9 07/27/2020 07/27/2020 07/09/2019 07/18/2017  Decreased Interest 0 0 0 0  Down, Depressed, Hopeless 0 0 0 0  PHQ - 2 Score 0 0 0 0  Altered sleeping 2 - - -  Tired, decreased energy 0 - - -  Change in appetite 0 - - -  Feeling bad or failure about yourself  0 - - -  Trouble concentrating 0 - - -  Moving slowly or fidgety/restless 0 - - -  Suicidal thoughts 0 - - -  PHQ-9 Score 2 - - -  Difficult doing work/chores Somewhat difficult - - -     Review of Systems:   Pertinent items are noted in HPI Denies fever/chills, dizziness, headaches, visual disturbances, fatigue, shortness of breath, chest pain, abdominal pain, vomiting, bowel movements, urination, or intercourse unless otherwise stated above.  Pertinent History Reviewed:  Reviewed past medical,surgical, social, obstetrical and family history.  Reviewed problem list, medications and allergies. Physical Assessment:   Vitals:   10/17/20 1154  BP: (!) 158/86  Pulse: 90  Weight: 198 lb (89.8 kg)  Height: 5\' 2"  (1.575 m)  Body mass index is 36.21 kg/m.       Physical Examination:   General appearance: alert, well appearing, and in no distress  Psych: mood appropriate, normal affect  Skin: warm & dry   Cardiovascular: normal heart rate noted  Respiratory: normal respiratory effort, no  distress  Abdomen: obese, soft, non-tender   Pelvic: normal external genitalia, vulva, vagina, cervix with 2cm polyp visible at os  Extremities: no edema   Procedure Note  Pre-operative Diagnosis: Cervical polyp, postmenopausal bleeding  Post-operative Diagnosis: same  Procedure Details  The risks (including infection, bleeding, pain, and uterine perforation) and benefits of the procedure were explained to the patient and Written informed consent was obtained.     The patient was placed in the dorsal lithotomy position.  A speculum inserted in the vagina, polyp was grasped with ring forceps and twisted off the stalk.  Polyp sent to pathology.    The cervix was then prepped with betadine.   A single tooth tenaculum was applied to the anterior lip of the cervix for stabilization.  A Pipelle endometrial aspirator was used to sample the endometrium.  Bleeding was noted from the polypectomy, Monsel's were applied.  The instruments were removed.  Sample was sent for pathologic examination.   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   Janyth Pupa, DO Attending [place], [facility] for [employer], [place]

## 2020-10-18 LAB — SURGICAL PATHOLOGY

## 2020-11-24 ENCOUNTER — Other Ambulatory Visit: Payer: Self-pay | Admitting: Nurse Practitioner

## 2020-12-18 ENCOUNTER — Ambulatory Visit (INDEPENDENT_AMBULATORY_CARE_PROVIDER_SITE_OTHER): Payer: PPO

## 2020-12-18 ENCOUNTER — Other Ambulatory Visit: Payer: Self-pay

## 2020-12-18 DIAGNOSIS — Z0000 Encounter for general adult medical examination without abnormal findings: Secondary | ICD-10-CM

## 2020-12-18 NOTE — Patient Instructions (Signed)
Diane Watkins , Thank you for taking time to come for your Medicare Wellness Visit. I appreciate your ongoing commitment to your health goals. Please review the following plan we discussed and let me know if I can assist you in the future.   Screening recommendations/referrals: Colonoscopy: Currently due, please keep appointment with gastroenterologist in September  Mammogram: Currently due, orders placed this visit Bone Density: Up to date, next due 08/07/2021 Recommended yearly ophthalmology/optometry visit for glaucoma screening and checkup Recommended yearly dental visit for hygiene and checkup  Vaccinations: Influenza vaccine: Up to date, next due fall 2022  Pneumococcal vaccine: Currently due,if you would like you may receive at your next office visit Tdap vaccine: Up to date,next due 05/23/2024 Shingles vaccine: Currently due for Shingrix, if you would like to receive we recommend that you do so at your local pharmacy     Advanced directives: Advance directive discussed with you today. Even though you declined this today please call our office should you change your mind and we can give you the proper paperwork for you to fill out.   Conditions/risks identified: None   Next appointment: None    Preventive Care 65 Years and Older, Female Preventive care refers to lifestyle choices and visits with your health care provider that can promote health and wellness. What does preventive care include? A yearly physical exam. This is also called an annual well check. Dental exams once or twice a year. Routine eye exams. Ask your health care provider how often you should have your eyes checked. Personal lifestyle choices, including: Daily care of your teeth and gums. Regular physical activity. Eating a healthy diet. Avoiding tobacco and drug use. Limiting alcohol use. Practicing safe sex. Taking low-dose aspirin every day. Taking vitamin and mineral supplements as recommended by your  health care provider. What happens during an annual well check? The services and screenings done by your health care provider during your annual well check will depend on your age, overall health, lifestyle risk factors, and family history of disease. Counseling  Your health care provider may ask you questions about your: Alcohol use. Tobacco use. Drug use. Emotional well-being. Home and relationship well-being. Sexual activity. Eating habits. History of falls. Memory and ability to understand (cognition). Work and work Statistician. Reproductive health. Screening  You may have the following tests or measurements: Height, weight, and BMI. Blood pressure. Lipid and cholesterol levels. These may be checked every 5 years, or more frequently if you are over 46 years old. Skin check. Lung cancer screening. You may have this screening every year starting at age 70 if you have a 30-pack-year history of smoking and currently smoke or have quit within the past 15 years. Fecal occult blood test (FOBT) of the stool. You may have this test every year starting at age 21. Flexible sigmoidoscopy or colonoscopy. You may have a sigmoidoscopy every 5 years or a colonoscopy every 10 years starting at age 66. Hepatitis C blood test. Hepatitis B blood test. Sexually transmitted disease (STD) testing. Diabetes screening. This is done by checking your blood sugar (glucose) after you have not eaten for a while (fasting). You may have this done every 1-3 years. Bone density scan. This is done to screen for osteoporosis. You may have this done starting at age 38. Mammogram. This may be done every 1-2 years. Talk to your health care provider about how often you should have regular mammograms. Talk with your health care provider about your test results, treatment options, and  if necessary, the need for more tests. Vaccines  Your health care provider may recommend certain vaccines, such as: Influenza vaccine.  This is recommended every year. Tetanus, diphtheria, and acellular pertussis (Tdap, Td) vaccine. You may need a Td booster every 10 years. Zoster vaccine. You may need this after age 65. Pneumococcal 13-valent conjugate (PCV13) vaccine. One dose is recommended after age 25. Pneumococcal polysaccharide (PPSV23) vaccine. One dose is recommended after age 67. Talk to your health care provider about which screenings and vaccines you need and how often you need them. This information is not intended to replace advice given to you by your health care provider. Make sure you discuss any questions you have with your health care provider. Document Released: 06/29/2015 Document Revised: 02/20/2016 Document Reviewed: 04/03/2015 Elsevier Interactive Patient Education  2017 [place] Prevention in the Home Falls can cause injuries. They can happen to people of all ages. There are many things you can do to make your home safe and to help prevent falls. What can I do on the outside of my home? Regularly fix the edges of walkways and driveways and fix any cracks. Remove anything that might make you trip as you walk through a door, such as a raised step or threshold. Trim any bushes or trees on the path to your home. Use bright outdoor lighting. Clear any walking paths of anything that might make someone trip, such as rocks or tools. Regularly check to see if handrails are loose or broken. Make sure that both sides of any steps have handrails. Any raised decks and porches should have guardrails on the edges. Have any leaves, snow, or ice cleared regularly. Use sand or salt on walking paths during winter. Clean up any spills in your garage right away. This includes oil or grease spills. What can I do in the bathroom? Use night lights. Install grab bars by the toilet and in the tub and shower. Do not use towel bars as grab bars. Use non-skid mats or decals in the tub or shower. If you need to sit  down in the shower, use a plastic, non-slip stool. Keep the floor dry. Clean up any water that spills on the floor as soon as it happens. Remove soap buildup in the tub or shower regularly. Attach bath mats securely with double-sided non-slip rug tape. Do not have throw rugs and other things on the floor that can make you trip. What can I do in the bedroom? Use night lights. Make sure that you have a light by your bed that is easy to reach. Do not use any sheets or blankets that are too big for your bed. They should not hang down onto the floor. Have a firm chair that has side arms. You can use this for support while you get dressed. Do not have throw rugs and other things on the floor that can make you trip. What can I do in the kitchen? Clean up any spills right away. Avoid walking on wet floors. Keep items that you use a lot in easy-to-reach places. If you need to reach something above you, use a strong step stool that has a grab bar. Keep electrical cords out of the way. Do not use floor polish or wax that makes floors slippery. If you must use wax, use non-skid floor wax. Do not have throw rugs and other things on the floor that can make you trip. What can I do with my stairs? Do not leave any  items on the stairs. Make sure that there are handrails on both sides of the stairs and use them. Fix handrails that are broken or loose. Make sure that handrails are as long as the stairways. Check any carpeting to make sure that it is firmly attached to the stairs. Fix any carpet that is loose or worn. Avoid having throw rugs at the top or bottom of the stairs. If you do have throw rugs, attach them to the floor with carpet tape. Make sure that you have a light switch at the top of the stairs and the bottom of the stairs. If you do not have them, ask someone to add them for you. What else can I do to help prevent falls? Wear shoes that: Do not have high heels. Have rubber bottoms. Are  comfortable and fit you well. Are closed at the toe. Do not wear sandals. If you use a stepladder: Make sure that it is fully opened. Do not climb a closed stepladder. Make sure that both sides of the stepladder are locked into place. Ask someone to hold it for you, if possible. Clearly mark and make sure that you can see: Any grab bars or handrails. First and last steps. Where the edge of each step is. Use tools that help you move around (mobility aids) if they are needed. These include: Canes. Walkers. Scooters. Crutches. Turn on the lights when you go into a dark area. Replace any light bulbs as soon as they burn out. Set up your furniture so you have a clear path. Avoid moving your furniture around. If any of your floors are uneven, fix them. If there are any pets around you, be aware of where they are. Review your medicines with your doctor. Some medicines can make you feel dizzy. This can increase your chance of falling. Ask your doctor what other things that you can do to help prevent falls. This information is not intended to replace advice given to you by your health care provider. Make sure you discuss any questions you have with your health care provider. Document Released: 03/29/2009 Document Revised: 11/08/2015 Document Reviewed: 07/07/2014 Elsevier Interactive Patient Education  2017 [employer].

## 2020-12-18 NOTE — Progress Notes (Addendum)
Subjective:   Diane Watkins is a 70 y.o. female who presents for an Initial Medicare Annual Wellness Visit.  I connected with [name]  today by telephone and verified that I am speaking with the correct person using two identifiers. Location patient: home Location provider: work Persons participating in the virtual visit: patient, provider.   I discussed the limitations, risks, security and privacy concerns of performing an evaluation and management service by telephone and the availability of in person appointments. I also discussed with the patient that there may be a patient responsible charge related to this service. The patient expressed understanding and verbally consented to this telephonic visit.    Interactive audio and video telecommunications were attempted between this provider and patient, however failed, due to patient having technical difficulties OR patient did not have access to video capability.  We continued and completed visit with audio only.     Review of Systems    N/A  Cardiac Risk Factors include: advanced age (>7men, >18 women);dyslipidemia;hypertension     Objective:    Today's Vitals   There is no height or weight on file to calculate BMI.  Advanced Directives 12/18/2020 09/27/2020 08/09/2020 03/13/2019 03/12/2019 03/29/2018 03/22/2018  Does Patient Have a Medical Advance Directive? No No No No No No No  Would patient like information on creating a medical advance directive? No - Patient declined - No - Patient declined No - Patient declined - No - Patient declined No - Patient declined    Current Medications (verified) Outpatient Encounter Medications as of 12/18/2020  Medication Sig   aspirin 81 MG tablet Take 81 mg by mouth at bedtime.    hydrochlorothiazide (HYDRODIURIL) 25 MG tablet Take 1 tablet (25 mg total) by mouth daily.   ibuprofen (ADVIL,MOTRIN) 200 MG tablet Take 400 mg by mouth daily as needed for mild pain or moderate pain.     lisinopril (ZESTRIL) 20 MG tablet TAKE 1 TABLET(20 MG) BY MOUTH DAILY   naproxen (NAPROSYN) 375 MG tablet Take 1 tablet (375 mg total) by mouth 2 (two) times daily with a meal. PRN pain   omega-3 acid ethyl esters (LOVAZA) 1 g capsule TAKE 2 CAPSULES BY MOUTH TWICE DAILY   Polyethyl Glycol-Propyl Glycol 0.4-0.3 % SOLN Apply 1-2 drops to eye daily as needed (FOR DRY EYE RELIEF).   rosuvastatin (CRESTOR) 20 MG tablet TAKE 1 TABLET(20 MG) BY MOUTH DAILY FOR CHOLESTEROL   diphenhydrAMINE (BENADRYL) 25 MG tablet Take 25 mg by mouth daily as needed for sleep. (Patient not taking: No sig reported)   [DISCONTINUED] escitalopram (LEXAPRO) 10 MG tablet Take 1 tablet (10 mg total) by mouth daily. (Patient not taking: No sig reported)   No facility-administered encounter medications on file as of 12/18/2020.    Allergies (verified) Amoxil [amoxicillin]   History: Past Medical History:  Diagnosis Date   Arthritis    High cholesterol    Hyperlipidemia    Hypertension    Past Surgical History:  Procedure Laterality Date   CATARACT EXTRACTION W/PHACO Left 03/29/2018   Procedure: CATARACT EXTRACTION PHACO AND INTRAOCULAR LENS PLACEMENT ([place]);  Surgeon: [name], MD;  Location: [facility];  Service: Ophthalmology;  Laterality: Left;  CDE: 10.13   CHOLECYSTECTOMY     TUBAL LIGATION     Family History  Problem Relation Age of Onset   Stroke Mother    Alcohol abuse Father    Heart disease Father 16       MI   Social History  Socioeconomic History   Marital status: Married    Spouse name: Not on file   Number of children: Not on file   Years of education: Not on file   Highest education level: Not on file  Occupational History   Not on file  Tobacco Use   Smoking status: Never   Smokeless tobacco: Never  Vaping Use   Vaping Use: Never used  Substance and Sexual Activity   Alcohol use: No   Drug use: No   Sexual activity: Yes    Birth control/protection: Post-menopausal  Other Topics  Concern   Not on file  Social History Narrative   Not on file   Social Determinants of Health   Financial Resource Strain: Low Risk    Difficulty of Paying Living Expenses: Not hard at all  Food Insecurity: No Food Insecurity   Worried About Charity fundraiser in the Last Year: Never true   [place] in the Last Year: Never true  Transportation Needs: No Transportation Needs   Lack of Transportation (Medical): No   Lack of Transportation (Non-Medical): No  Physical Activity: Inactive   Days of Exercise per Week: 0 days   Minutes of Exercise per Session: 0 min  Stress: No Stress Concern Present   Feeling of Stress : Not at all  Social Connections: Moderately Integrated   Frequency of Communication with Friends and Family: More than three times a week   Frequency of Social Gatherings with Friends and Family: More than three times a week   Attends Religious Services: More than 4 times per year   Active Member of [employer] or Organizations: No   Attends Music therapist: Never   Marital Status: Married    Tobacco Counseling Counseling given: Not Answered   Clinical Intake:  Pre-visit preparation completed: Yes        Nutritional Risks: None Diabetes: No  How often do you need to have someone help you when you read instructions, pamphlets, or other written materials from your doctor or pharmacy?: 1 - Never  Diabetic?No   Interpreter Needed?: No  Information entered by :: [place] of Daily Living In your present state of health, do you have any difficulty performing the following activities: 12/18/2020  Hearing? N  Vision? N  Difficulty concentrating or making decisions? N  Walking or climbing stairs? N  Dressing or bathing? N  Doing errands, shopping? N  Preparing Food and eating ? N  Using the Toilet? N  In the past six months, have you accidently leaked urine? N  Do you have problems with loss of bowel control? N  Managing  your Medications? N  Managing your Finances? N  Housekeeping or managing your Housekeeping? N  Some recent data might be hidden    Patient Care Team: Erven Colla, DO as PCP - General (Family Medicine)  Indicate any recent Medical Services you may have received from other than [facility] providers in the past year (date may be approximate).     Assessment:   This is a routine wellness examination for Nikitta.  Hearing/Vision screen Vision Screening - Comments:: Patient states had recent eye exam. Currently wears reading glasses   Dietary issues and exercise activities discussed: Current Exercise Habits: The patient does not participate in regular exercise at present, Exercise limited by: None identified   Goals Addressed             This Visit's Progress    Weight (lb) <  175 lb (79.4 kg)         Depression Screen PHQ 2/9 Scores 12/18/2020 07/27/2020 07/27/2020 07/09/2019 07/18/2017  PHQ - 2 Score 0 0 0 0 0  PHQ- 9 Score - 2 - - -    Fall Risk Fall Risk  12/18/2020 09/26/2020 07/27/2020 07/09/2019 01/13/2019  Falls in the past year? 0 0 1 0 (No Data)  Comment - - - - Emmi Telephone Survey: data to providers prior to load  Number falls in past yr: 0 0 0 0 (No Data)  Comment - - - - Emmi Telephone Survey Actual Response =   Injury with Fall? 0 0 - 0 -  Risk for fall due to : No Fall Risks No Fall Risks - No Fall Risks -  Follow up Falls evaluation completed;Falls prevention discussed Falls evaluation completed Falls evaluation completed Falls evaluation completed -    FALL RISK PREVENTION PERTAINING TO THE HOME:  Any stairs in or around the home? No  If so, are there any without handrails? No  Home free of loose throw rugs in walkways, pet beds, electrical cords, etc? Yes  Adequate lighting in your home to reduce risk of falls? Yes   ASSISTIVE DEVICES UTILIZED TO PREVENT FALLS:  Life alert? No  Use of a cane, walker or w/c? No  Grab bars in the bathroom? No  Shower chair or  bench in shower? No  Elevated toilet seat or a handicapped toilet? Yes     Cognitive Function:  Normal cognitive status assessed by direct observation by this Nurse Health Advisor. No abnormalities found.        Immunizations Immunization History  Administered Date(s) Administered   Influenza Split 05/04/2013   Influenza,inj,Quad PF,6+ Mos 04/12/2014, 05/01/2015, 05/19/2017, 07/08/2019   Influenza-Unspecified 05/19/2017   Tdap 05/23/2014   Zoster, Live 05/23/2014    TDAP status: Up to date  Flu Vaccine status: Up to date  Pneumococcal vaccine status: Due, Education has been provided regarding the importance of this vaccine. Advised may receive this vaccine at local pharmacy or Health Dept. Aware to provide a copy of the vaccination record if obtained from local pharmacy or Health Dept. Verbalized acceptance and understanding.  Covid-19 vaccine status: Declined, Education has been provided regarding the importance of this vaccine but patient still declined. Advised may receive this vaccine at local pharmacy or Health Dept.or vaccine clinic. Aware to provide a copy of the vaccination record if obtained from local pharmacy or Health Dept. Verbalized acceptance and understanding.  Qualifies for Shingles Vaccine? Yes   Zostavax completed Yes   Shingrix Completed?: No.    Education has been provided regarding the importance of this vaccine. Patient has been advised to call insurance company to determine out of pocket expense if they have not yet received this vaccine. Advised may also receive vaccine at local pharmacy or Health Dept. Verbalized acceptance and understanding.  Screening Tests Health Maintenance  Topic Date Due   Zoster Vaccines- Shingrix (1 of 2) Never done   COLONOSCOPY (Pts 45-71yrs Insurance coverage will need to be confirmed)  07/28/2021 (Originally 03/21/1998)   PNA vac Low Risk Adult (1 of 2 - PCV13) 07/28/2021 (Originally 03/21/2018)   INFLUENZA VACCINE   01/14/2021   MAMMOGRAM  08/15/2021   TETANUS/TDAP  05/23/2024   DEXA SCAN  Completed   Hepatitis C Screening  Completed   HPV VACCINES  Aged Out   COVID-19 Vaccine  Discontinued    Health Maintenance  Health Maintenance Due  Topic  Date Due   Zoster Vaccines- Shingrix (1 of 2) Never done    Colorectal cancer screening: Referral to GI placed 12/18/2020. Pt aware the office will call re: appt.  Mammogram status: Ordered 12/18/2020. Pt provided with contact info and advised to call to schedule appt.   Bone Density status: Completed 08/08/2019. Results reflect: Bone density results: OSTEOPENIA. Repeat every 2 years.  Lung Cancer Screening: (Low Dose CT Chest recommended if Age 63-80 years, 30 pack-year currently smoking OR have quit w/in 15years.) does not qualify.   Lung Cancer Screening Referral: N/A   Additional Screening:  Hepatitis C Screening: does qualify; Completed 07/08/2019  Vision Screening: Recommended annual ophthalmology exams for early detection of glaucoma and other disorders of the eye. Is the patient up to date with their annual eye exam?  Yes  Who is the provider or what is the name of the office in which the patient attends annual eye exams? Dr. Jorja Loa If pt is not established with a provider, would they like to be referred to a provider to establish care? No .   Dental Screening: Recommended annual dental exams for proper oral hygiene  Community Resource Referral / Chronic Care Management: CRR required this visit?  No   CCM required this visit?  No      Plan:     I have personally reviewed and noted the following in the patient's chart:   Medical and social history Use of alcohol, tobacco or illicit drugs  Current medications and supplements including opioid prescriptions. Patient is not currently taking opioid prescriptions. Functional ability and status Nutritional status Physical activity Advanced directives List of other  physicians Hospitalizations, surgeries, and ER visits in previous 12 months Vitals Screenings to include cognitive, depression, and falls Referrals and appointments  In addition, I have reviewed and discussed with patient certain preventive protocols, quality metrics, and best practice recommendations. A written personalized care plan for preventive services as well as general preventive health recommendations were provided to patient.     Ofilia Neas, LPN   01/18/2777   Nurse Notes: None    I have reviewed and agree with the above AWV documentation.   [place], DO  [place], DO 12/19/2020 9:21 AM

## 2020-12-28 ENCOUNTER — Other Ambulatory Visit: Payer: Self-pay | Admitting: Nurse Practitioner

## 2021-02-28 ENCOUNTER — Ambulatory Visit (INDEPENDENT_AMBULATORY_CARE_PROVIDER_SITE_OTHER): Payer: PPO | Admitting: Gastroenterology

## 2021-03-07 ENCOUNTER — Other Ambulatory Visit: Payer: Self-pay | Admitting: Family Medicine

## 2021-03-12 ENCOUNTER — Other Ambulatory Visit: Payer: Self-pay | Admitting: Nurse Practitioner

## 2021-03-12 DIAGNOSIS — Z1231 Encounter for screening mammogram for malignant neoplasm of breast: Secondary | ICD-10-CM

## 2021-03-13 ENCOUNTER — Ambulatory Visit
Admission: RE | Admit: 2021-03-13 | Discharge: 2021-03-13 | Disposition: A | Discharge status: Home / Self Care | Payer: PPO | Source: Ambulatory Visit | Attending: Nurse Practitioner | Admitting: Nurse Practitioner

## 2021-03-13 ENCOUNTER — Other Ambulatory Visit: Payer: Self-pay

## 2021-03-13 DIAGNOSIS — Z1231 Encounter for screening mammogram for malignant neoplasm of breast: Secondary | ICD-10-CM | POA: Diagnosis not present

## 2021-03-22 ENCOUNTER — Ambulatory Visit
Admission: EM | Admit: 2021-03-22 | Discharge: 2021-03-22 | Disposition: A | Discharge status: Home / Self Care | Payer: PPO | Attending: Family Medicine | Admitting: Family Medicine

## 2021-03-22 ENCOUNTER — Encounter: Payer: Self-pay | Admitting: Emergency Medicine

## 2021-03-22 ENCOUNTER — Other Ambulatory Visit: Payer: Self-pay

## 2021-03-22 DIAGNOSIS — U071 COVID-19: Secondary | ICD-10-CM

## 2021-03-22 MED ORDER — MOLNUPIRAVIR EUA 200MG CAPSULE: 4.0000 | ORAL_CAPSULE | Freq: Two times a day (BID) | ORAL | 0 refills | Status: AC

## 2021-03-22 NOTE — ED Triage Notes (Signed)
Nasal congestion, sore throat, chills, fatigue, fever since last night.  Home covid test today was positive.

## 2021-03-22 NOTE — ED Provider Notes (Signed)
[facility]    CSN: [id number] Arrival date & time: 03/22/21  1306      History   Chief Complaint No chief complaint on file.   HPI Diane Watkins is a 70 y.o. female.   Patient presenting today with 1 day history of congestion, hacking cough, sore throat, chills, body aches, fever, fatigue.  Denies chest pain, shortness of breath, abdominal pain, nausea vomiting or diarrhea.  Taking Mucinex, Tylenol with mild temporary relief of symptoms.  Husband recently diagnosed with COVID, her home COVID test was positive this morning.  Denies any history of pulmonary issues or immune compromise.  Requesting COVID antivirals.   Past Medical History:  Diagnosis Date   Arthritis    High cholesterol    Hyperlipidemia    Hypertension     Patient Active Problem List   Diagnosis Date Noted   Hyperlipidemia 07/09/2019   COVID-19 virus detected 03/13/2019   COVID-19 virus infection 03/12/2019   Fever 03/12/2019   Primary osteoarthritis of both knees 05/01/2015   Osteopenia 05/17/2014   Essential hypertension, benign 05/04/2013   Hypertriglyceridemia 05/04/2013    Past Surgical History:  Procedure Laterality Date   CATARACT EXTRACTION W/PHACO Left 03/29/2018   Procedure: CATARACT EXTRACTION PHACO AND INTRAOCULAR LENS PLACEMENT ([place]);  Surgeon: [name], MD;  Location: [facility];  Service: Ophthalmology;  Laterality: Left;  CDE: 10.13   CHOLECYSTECTOMY     TUBAL LIGATION      OB History     Gravida  2   Para  2   Term  2   Preterm      AB      Living  2      SAB      IAB      Ectopic      Multiple      Live Births               Home Medications    Prior to Admission medications   Medication Sig Start Date End Date Taking? Authorizing Provider  molnupiravir EUA (LAGEVRIO) 200 mg CAPS capsule Take 4 capsules (800 mg total) by mouth 2 (two) times daily for 5 days. 03/22/21 03/27/21 Yes Volney American, PA-C  aspirin 81 MG tablet Take 81  mg by mouth at bedtime.     [provider]  diphenhydrAMINE (BENADRYL) 25 MG tablet Take 25 mg by mouth daily as needed for sleep. Patient not taking: No sig reported    [provider]  hydrochlorothiazide (HYDRODIURIL) 25 MG tablet Take 1 tablet (25 mg total) by mouth daily. 01/06/20   Nilda Simmer, NP  ibuprofen (ADVIL,MOTRIN) 200 MG tablet Take 400 mg by mouth daily as needed for mild pain or moderate pain.     [provider]  lisinopril (ZESTRIL) 20 MG tablet TAKE 1 TABLET(20 MG) BY MOUTH DAILY 12/28/20   Lovena Le, Malena M, DO  naproxen (NAPROSYN) 375 MG tablet Take 1 tablet (375 mg total) by mouth 2 (two) times daily with a meal. PRN pain 07/27/20   Nilda Simmer, NP  omega-3 acid ethyl esters (LOVAZA) 1 g capsule TAKE 2 CAPSULES BY MOUTH TWICE DAILY 11/27/20   Lovena Le, Malena M, DO  Polyethyl Glycol-Propyl Glycol 0.4-0.3 % SOLN Apply 1-2 drops to eye daily as needed (FOR DRY EYE RELIEF).    [provider]  rosuvastatin (CRESTOR) 20 MG tablet TAKE 1 TABLET(20 MG) BY MOUTH DAILY FOR CHOLESTEROL 12/28/20   Elvia Collum M, DO  Family History Family History  Problem Relation Age of Onset   Stroke Mother    Alcohol abuse Father    Heart disease Father 24       MI    Social History Social History   Tobacco Use   Smoking status: Never   Smokeless tobacco: Never  Vaping Use   Vaping Use: Never used  Substance Use Topics   Alcohol use: No   Drug use: No     Allergies   Amoxil [amoxicillin]   Review of Systems Review of Systems Per HPI  Physical Exam Triage Vital Signs ED Triage Vitals  Enc Vitals Group     BP 03/22/21 1337 117/80     Pulse Rate 03/22/21 1337 (!) 121     Resp 03/22/21 1337 18     Temp 03/22/21 1337 99.3 F (37.4 C)     Temp Source 03/22/21 1337 Oral     SpO2 03/22/21 1337 95 %     Weight --      Height --      Head Circumference --      Peak Flow --      Pain Score 03/22/21 1338 0     Pain Loc --       Pain Edu? --      Excl. in [place]? --    No data found.  Updated Vital Signs BP 117/80 (BP Location: Right Arm)   Pulse (!) 121   Temp 99.3 F (37.4 C) (Oral)   Resp 18   SpO2 95%   Visual Acuity Right Eye Distance:   Left Eye Distance:   Bilateral Distance:    Right Eye Near:   Left Eye Near:    Bilateral Near:     Physical Exam Vitals and nursing note reviewed.  Constitutional:      Appearance: Normal appearance. She is not ill-appearing.  HENT:     Head: Atraumatic.     Right Ear: Tympanic membrane normal.     Left Ear: Tympanic membrane normal.     Nose: Rhinorrhea present.     Mouth/Throat:     Mouth: Mucous membranes are moist.     Pharynx: Oropharynx is clear. Posterior oropharyngeal erythema present.  Eyes:     Extraocular Movements: Extraocular movements intact.     Conjunctiva/sclera: Conjunctivae normal.  Cardiovascular:     Rate and Rhythm: Normal rate and regular rhythm.     Heart sounds: Normal heart sounds.  Pulmonary:     Effort: Pulmonary effort is normal. No respiratory distress.     Breath sounds: Normal breath sounds. No wheezing or rales.  Abdominal:     General: Bowel sounds are normal. There is no distension.     Palpations: Abdomen is soft.     Tenderness: There is no abdominal tenderness. There is no guarding.  Musculoskeletal:        General: Normal range of motion.     Cervical back: Normal range of motion and neck supple.  Skin:    General: Skin is warm and dry.  Neurological:     Mental Status: She is alert and oriented to person, place, and time.     Motor: No weakness.     Gait: Gait normal.  Psychiatric:        Mood and Affect: Mood normal.        Thought Content: Thought content normal.        Judgment: Judgment normal.     UC Treatments /  Results  Labs (all labs ordered are listed, but only abnormal results are displayed) Labs Reviewed - No data to display  EKG   Radiology No results  found.  Procedures Procedures (including critical care time)  Medications Ordered in UC Medications - No data to display  Initial Impression / Assessment and Plan / UC Course  I have reviewed the triage vital signs and the nursing notes.  Pertinent labs & imaging results that were available during my care of the patient were reviewed by me and considered in my medical decision making (see chart for details).     Home test for COVID-positive this morning, mildly tachycardic in triage but otherwise vital signs reassuring.  Exam overall reassuring with no significant concerning findings.  Patient is requesting antivirals, given her current medications and interactions with Paxil of it recommend the molnupiravir.  She is agreeable and this was sent to the pharmacy.  She declines symptomatic management medications at this time and will take over-the-counter cold and congestion medications.  Return for acutely worsening symptoms.  Final Clinical Impressions(s) / UC Diagnoses   Final diagnoses:  [id number]   Discharge Instructions   None    ED Prescriptions     Medication Sig Dispense Auth. Provider   molnupiravir EUA (LAGEVRIO) 200 mg CAPS capsule Take 4 capsules (800 mg total) by mouth 2 (two) times daily for 5 days. 40 capsule Volney American, [place]      PDMP not reviewed this encounter.   Volney American, [place] 03/22/21 1459

## 2021-04-03 ENCOUNTER — Other Ambulatory Visit: Payer: Self-pay

## 2021-04-03 ENCOUNTER — Ambulatory Visit (INDEPENDENT_AMBULATORY_CARE_PROVIDER_SITE_OTHER): Payer: PPO | Admitting: Family Medicine

## 2021-04-03 ENCOUNTER — Encounter: Payer: Self-pay | Admitting: Family Medicine

## 2021-04-03 VITALS — BP 128/72 | HR 83 | Temp 97.2°F | Ht 62.0 in | Wt 199.4 lb

## 2021-04-03 DIAGNOSIS — I10 Essential (primary) hypertension: Secondary | ICD-10-CM | POA: Diagnosis not present

## 2021-04-03 DIAGNOSIS — E782 Mixed hyperlipidemia: Secondary | ICD-10-CM

## 2021-04-03 DIAGNOSIS — M170 Bilateral primary osteoarthritis of knee: Secondary | ICD-10-CM | POA: Diagnosis not present

## 2021-04-03 MED ORDER — LISINOPRIL 20 MG PO TABS: 20.0000 mg | ORAL_TABLET | Freq: Every day | ORAL | 1 refills | Status: DC

## 2021-04-03 MED ORDER — OMEGA-3-ACID ETHYL ESTERS 1 G PO CAPS: 2.0000 | ORAL_CAPSULE | Freq: Two times a day (BID) | ORAL | 1 refills | Status: DC

## 2021-04-03 MED ORDER — ROSUVASTATIN CALCIUM 20 MG PO TABS: 20.0000 mg | ORAL_TABLET | Freq: Every day | ORAL | 1 refills | Status: DC

## 2021-04-03 MED ORDER — NAPROXEN 375 MG PO TABS: 375.0000 mg | ORAL_TABLET | Freq: Two times a day (BID) | ORAL | 0 refills | Status: DC | PRN

## 2021-04-03 NOTE — Patient Instructions (Signed)
I have refilled your medications.  Stop the aspirin.  Lab at your convenience (fasting).  Follow up in 6 months.  Take care  Dr. Lacinda Axon

## 2021-04-04 NOTE — Assessment & Plan Note (Signed)
Continue PRN NSAID. Naproxen refilled today.

## 2021-04-04 NOTE — Progress Notes (Signed)
Subjective:  Patient ID: Diane Watkins, female    DOB: 03-Oct-1950  Age: 70 y.o. MRN: [id number]  CC: Chief Complaint  Patient presents with   Establish Care    HPI:  70 year old female presents for follow up and to establish care with me. Patient states the she needs refills on her medications.  Hypertension At goal on Lisinopril. Uses HCTZ PRN for edema. Needs refill of lisinopril today.   HLD Unsure of control/compliance. Last lipid panel was in 2021 - Total cholesterol 206, Triglycerides 220, LDL 118. Currently on Crestor 20 mg and Lovaza. Needs refills today.   Osteoarthritis Doing okay. Uses PRN Naproxen and PRN Ibuprofen. Requesting refill of Naproxen.  Patient Active Problem List   Diagnosis Date Noted   Hyperlipidemia 07/09/2019   Primary osteoarthritis of both knees 05/01/2015   Osteopenia 05/17/2014   Essential hypertension, benign 05/04/2013   Hypertriglyceridemia 05/04/2013    Social Hx   Social History   Socioeconomic History   Marital status: Married    Spouse name: Not on file   Number of children: Not on file   Years of education: Not on file   Highest education level: Not on file  Occupational History   Not on file  Tobacco Use   Smoking status: Never   Smokeless tobacco: Never  Vaping Use   Vaping Use: Never used  Substance and Sexual Activity   Alcohol use: No   Drug use: No   Sexual activity: Yes    Birth control/protection: Post-menopausal  Other Topics Concern   Not on file  Social History Narrative   Not on file   Social Determinants of Health   Financial Resource Strain: Low Risk    Difficulty of Paying Living Expenses: Not hard at all  Food Insecurity: No Food Insecurity   Worried About Charity fundraiser in the Last Year: Never true   [place] in the Last Year: Never true  Transportation Needs: No Transportation Needs   Lack of Transportation (Medical): No   Lack of Transportation (Non-Medical): No   Physical Activity: Inactive   Days of Exercise per Week: 0 days   Minutes of Exercise per Session: 0 min  Stress: No Stress Concern Present   Feeling of Stress : Not at all  Social Connections: Moderately Integrated   Frequency of Communication with Friends and Family: More than three times a week   Frequency of Social Gatherings with Friends and Family: More than three times a week   Attends Religious Services: More than 4 times per year   Active Member of [employer] or Organizations: No   Attends Archivist Meetings: Never   Marital Status: Married    Review of Systems  Constitutional: Negative.   Musculoskeletal:  Positive for arthralgias.   Objective:  BP 128/72   Pulse 83   Temp (!) 97.2 F (36.2 C)   Ht 5\' 2"  (1.575 m)   Wt 199 lb 6.4 oz (90.4 kg)   SpO2 97%   BMI 36.47 kg/m   BP/Weight 04/03/2021 53/11/1441 06/20/4006  Systolic BP 676 195 -  Diastolic BP 72 80 -  Wt. (Lbs) 199.4 - -  BMI 36.47 - -    Physical Exam Vitals and nursing note reviewed.  Constitutional:      General: She is not in acute distress.    Appearance: Normal appearance. She is not ill-appearing.  HENT:     Head: Normocephalic and atraumatic.  Cardiovascular:  Rate and Rhythm: Normal rate and regular rhythm.  Pulmonary:     Effort: Pulmonary effort is normal.     Breath sounds: Normal breath sounds. No wheezing, rhonchi or rales.  Neurological:     Mental Status: She is alert.  Psychiatric:        Mood and Affect: Mood normal.        Behavior: Behavior normal.    Lab Results  Component Value Date   WBC 9.4 09/27/2020   HGB 14.4 09/27/2020   HCT 42.1 09/27/2020   PLT 266 09/27/2020   GLUCOSE 95 09/27/2020   CHOL 206 (H) 03/30/2020   TRIG 220 (H) 03/30/2020   HDL 50 03/30/2020   LDLCALC 118 (H) 03/30/2020   ALT 29 09/27/2020   AST 27 09/27/2020   NA 138 09/27/2020   K 4.2 09/27/2020   CL 102 09/27/2020   CREATININE 1.02 (H) 09/27/2020   BUN 20 09/27/2020   CO2 25  09/27/2020   TSH 2.830 07/08/2019     Assessment & Plan:   Problem List Items Addressed This Visit       Cardiovascular and Mediastinum   Essential hypertension, benign    At goal. Continue Lisinopril. Refilled today.      Relevant Medications   lisinopril (ZESTRIL) 20 MG tablet   rosuvastatin (CRESTOR) 20 MG tablet   omega-3 acid ethyl esters (LOVAZA) 1 g capsule     Musculoskeletal and Integument   Primary osteoarthritis of both knees    Continue PRN NSAID. Naproxen refilled today.       Relevant Medications   naproxen (NAPROSYN) 375 MG tablet     Other   Hyperlipidemia - Primary    Lipid panel order. Crestor and Lovaza refilled.  May need changes pending Lipid panel results.      Relevant Medications   lisinopril (ZESTRIL) 20 MG tablet   rosuvastatin (CRESTOR) 20 MG tablet   omega-3 acid ethyl esters (LOVAZA) 1 g capsule   Other Relevant Orders   Lipid panel    Meds ordered this encounter  Medications   naproxen (NAPROSYN) 375 MG tablet    Sig: Take 1 tablet (375 mg total) by mouth 2 (two) times daily as needed for moderate pain or mild pain. PRN pain    Dispense:  60 tablet    Refill:  0   lisinopril (ZESTRIL) 20 MG tablet    Sig: Take 1 tablet (20 mg total) by mouth daily.    Dispense:  90 tablet    Refill:  1   rosuvastatin (CRESTOR) 20 MG tablet    Sig: Take 1 tablet (20 mg total) by mouth daily.    Dispense:  90 tablet    Refill:  1   omega-3 acid ethyl esters (LOVAZA) 1 g capsule    Sig: Take 2 capsules (2 g total) by mouth 2 (two) times daily.    Dispense:  360 capsule    Refill:  1    Follow-up:  Return in about 6 months (around 10/02/2021) for Follow up Chronic medical issues.  [place]

## 2021-04-04 NOTE — Assessment & Plan Note (Signed)
At goal. Continue Lisinopril. Refilled today.

## 2021-04-04 NOTE — Assessment & Plan Note (Signed)
Lipid panel order. Crestor and Lovaza refilled.  May need changes pending Lipid panel results.

## 2021-04-18 DIAGNOSIS — E782 Mixed hyperlipidemia: Secondary | ICD-10-CM | POA: Diagnosis not present

## 2021-04-19 LAB — LIPID PANEL
Chol/HDL Ratio: 3.8 ratio (ref 0.0–4.4)
Cholesterol, Total: 208 mg/dL — ABNORMAL HIGH (ref 100–199)
HDL: 55 mg/dL (ref >39)
LDL Chol Calc (NIH): 125 mg/dL — ABNORMAL HIGH (ref 0–99)
Triglycerides: 156 mg/dL — ABNORMAL HIGH (ref 0–149)
VLDL Cholesterol Cal: 28 mg/dL (ref 5–40)

## 2021-04-23 ENCOUNTER — Ambulatory Visit (INDEPENDENT_AMBULATORY_CARE_PROVIDER_SITE_OTHER): Payer: PPO | Admitting: Internal Medicine

## 2021-04-25 ENCOUNTER — Other Ambulatory Visit: Payer: PPO | Admitting: Obstetrics & Gynecology

## 2021-04-29 ENCOUNTER — Other Ambulatory Visit: Payer: Self-pay

## 2021-04-29 DIAGNOSIS — E782 Mixed hyperlipidemia: Secondary | ICD-10-CM

## 2021-04-29 MED ORDER — ROSUVASTATIN CALCIUM 40 MG PO TABS: 40.0000 mg | ORAL_TABLET | Freq: Every day | ORAL | 1 refills | Status: DC

## 2021-05-15 ENCOUNTER — Other Ambulatory Visit (HOSPITAL_COMMUNITY)
Admission: RE | Admit: 2021-05-15 | Discharge: 2021-05-15 | Disposition: A | Discharge status: Home / Self Care | Payer: PPO | Source: Ambulatory Visit | Attending: Obstetrics & Gynecology | Admitting: Obstetrics & Gynecology

## 2021-05-15 ENCOUNTER — Encounter: Payer: Self-pay | Admitting: Obstetrics & Gynecology

## 2021-05-15 ENCOUNTER — Other Ambulatory Visit: Payer: Self-pay

## 2021-05-15 ENCOUNTER — Ambulatory Visit (INDEPENDENT_AMBULATORY_CARE_PROVIDER_SITE_OTHER): Payer: PPO | Admitting: Obstetrics & Gynecology

## 2021-05-15 VITALS — BP 156/86 | HR 83 | Ht 62.0 in | Wt 202.0 lb

## 2021-05-15 DIAGNOSIS — R87610 Atypical squamous cells of undetermined significance on cytologic smear of cervix (ASC-US): Secondary | ICD-10-CM | POA: Insufficient documentation

## 2021-05-15 DIAGNOSIS — Z124 Encounter for screening for malignant neoplasm of cervix: Secondary | ICD-10-CM | POA: Diagnosis not present

## 2021-05-15 DIAGNOSIS — Z1151 Encounter for screening for human papillomavirus (HPV): Secondary | ICD-10-CM | POA: Diagnosis not present

## 2021-05-15 NOTE — Progress Notes (Signed)
   GYN VISIT Patient name: Diane Watkins MRN [id number]  Date of birth: 08/31/1950 Chief Complaint:   Gynecologic Exam  History of Present Illness:   Diane Watkins is a 70 y.o. [phone] female being seen today for repeat pap smear.  In May 2022- pt noted to have Pap- ASCUS, HPV neg.  On exam- polyp present and removed.  Suspect polyp was cause of ASCUS.  Denies vaginal bleeding, discharge, itching or irritation.  Denies pelvic or abdominal pain.  No acute complaints or concerns.     No LMP recorded. Patient is postmenopausal.  Depression screen [facility] 2/9 12/18/2020 07/27/2020 07/27/2020 07/09/2019 07/18/2017  Decreased Interest 0 0 0 0 0  Down, Depressed, Hopeless 0 0 0 0 0  PHQ - 2 Score 0 0 0 0 0  Altered sleeping - 2 - - -  Tired, decreased energy - 0 - - -  Change in appetite - 0 - - -  Feeling bad or failure about yourself  - 0 - - -  Trouble concentrating - 0 - - -  Moving slowly or fidgety/restless - 0 - - -  Suicidal thoughts - 0 - - -  PHQ-9 Score - 2 - - -  Difficult doing work/chores - Somewhat difficult - - -     Review of Systems:   Pertinent items are noted in HPI Denies fever/chills, dizziness, headaches, visual disturbances, fatigue, shortness of breath, chest pain, abdominal pain, vomiting, bowel movements, urination, or intercourse unless otherwise stated above.  Pertinent History Reviewed:  Reviewed past medical,surgical, social, obstetrical and family history.  Reviewed problem list, medications and allergies. Physical Assessment:   Vitals:   05/15/21 1511  BP: (!) 156/86  Pulse: 83  Weight: 202 lb (91.6 kg)  Height: 5\' 2"  (1.575 m)  Body mass index is 36.95 kg/m.       Physical Examination:   General appearance: alert, well appearing, and in no distress  Psych: mood appropriate, normal affect  Skin: warm & dry   Cardiovascular: normal heart rate noted  Respiratory: normal respiratory effort, no distress  Abdomen: obese, soft, non-tender   Pelvic:  normal external genitalia, vulva, vagina- pink flat mucosa, cervix- normal appearance  Extremities: no calf tenderness bilaterally  Chaperone: Celene Squibb    Assessment & Plan:  1) ASCUS, HPV neg, s/p polyp removal -repeat pap today, further management pending results -as mentioned suspect prior ASCUS due to polyp- now removed  F/u for annual in 81yr   Return in about 1 year (around 05/15/2022) for Annual.   Janyth Pupa, DO Attending [place], [place] for [employer], [place]

## 2021-05-17 LAB — CYTOLOGY - PAP
Comment: NEGATIVE
Diagnosis: NEGATIVE
High risk HPV: NEGATIVE

## 2021-07-09 ENCOUNTER — Ambulatory Visit (INDEPENDENT_AMBULATORY_CARE_PROVIDER_SITE_OTHER): Payer: PPO | Admitting: Gastroenterology

## 2021-07-09 ENCOUNTER — Encounter (INDEPENDENT_AMBULATORY_CARE_PROVIDER_SITE_OTHER): Payer: Self-pay | Admitting: Gastroenterology

## 2021-07-09 ENCOUNTER — Other Ambulatory Visit: Payer: Self-pay

## 2021-07-09 VITALS — BP 141/93 | HR 101 | Temp 98.6°F | Ht 63.0 in | Wt 201.9 lb

## 2021-07-09 DIAGNOSIS — R1011 Right upper quadrant pain: Secondary | ICD-10-CM | POA: Insufficient documentation

## 2021-07-09 DIAGNOSIS — R195 Other fecal abnormalities: Secondary | ICD-10-CM | POA: Insufficient documentation

## 2021-07-09 DIAGNOSIS — K5900 Constipation, unspecified: Secondary | ICD-10-CM | POA: Diagnosis not present

## 2021-07-09 NOTE — Progress Notes (Signed)
Referring Provider: Erven Colla, DO Primary Care Physician:  Coral Spikes, DO Primary GI Physician: new  Chief Complaint  Patient presents with   decreased appetite    Patient here today with complaints of changes in her bm's states her bm's are thin.No dark or bloody stools. She has some right mid to upper quadrant pains off and on. She reports to have a decreased appetite. Feels like she can not complete a bm, like it is something blocking it from coming out.   HPI:   Diane Watkins is a 71 y.o. female with past medical history of arthritis, high cholesterol, HLD, HTN.  Patient presenting today for changes in bowels and to schedule colonoscopy.  Patient states that in April 2022 she began having some R abdominal discomfort for which she went to the ED for without acute findings. She states that she also started having some changes in her bowels with difficulty feeling like she was emtpying out completely and having skinnier stools. She did milk of magnesia x7 days thereafter and states that symptoms resolved. In Nov 2022 she started noticing that she was having constipation again, she has been taking stool softeners and miralax everyday. Initially she was doing 3 stool softeners per day, alternating with 1 capful of miralax  per day. She states that stools are soft, she may have 3-4 per day but still feels that she is not emptying her bowel completely, stools are skinny in nature, though sometimes very long. She does have some R abdominal discomfort (s/p cholecystectomy previously), she will notice bulging in her upper R abdomen and a constant nagging pain that radiates to her R back. No nausea or vomiting. Does not seem to be precipitated by certain foods. She denies any rectal bleeding or melena, no weight loss but unable to eat as much as she used to due to feeling full all the time. She has occasional acid reflux, usually only if she eats late or spicy foods, does not usually take  anything for it.   NSAID use: some form of NSAID atleast once per week for arthritic pain. Social hx: no etoh or tobacco  Fam hx:no crc or liver disease  Last Colonoscopy:never Last Endoscopy:never  Past Medical History:  Diagnosis Date   Arthritis    High cholesterol    Hyperlipidemia    Hypertension     Past Surgical History:  Procedure Laterality Date   CATARACT EXTRACTION W/PHACO Left 03/29/2018   Procedure: CATARACT EXTRACTION PHACO AND INTRAOCULAR LENS PLACEMENT ([place]);  Surgeon: [name], MD;  Location: [facility];  Service: Ophthalmology;  Laterality: Left;  CDE: 10.13   CHOLECYSTECTOMY     TUBAL LIGATION      Current Outpatient Medications  Medication Sig Dispense Refill   Cetirizine HCl (ZYRTEC ALLERGY PO) Take by mouth as needed.     diphenhydrAMINE (BENADRYL) 25 MG tablet Take 25 mg by mouth daily as needed for sleep.     hydrochlorothiazide (HYDRODIURIL) 25 MG tablet Take 1 tablet (25 mg total) by mouth daily. 30 tablet 2   lisinopril (ZESTRIL) 20 MG tablet Take 1 tablet (20 mg total) by mouth daily. 90 tablet 1   naproxen (NAPROSYN) 375 MG tablet Take 1 tablet (375 mg total) by mouth 2 (two) times daily as needed for moderate pain or mild pain. PRN pain 60 tablet 0   omega-3 acid ethyl esters (LOVAZA) 1 g capsule Take 2 capsules (2 g total) by mouth 2 (two) times daily. [place]  capsule 1   Polyethyl Glycol-Propyl Glycol 0.4-0.3 % SOLN Apply 1-2 drops to eye daily as needed (FOR DRY EYE RELIEF).     rosuvastatin (CRESTOR) 40 MG tablet Take 1 tablet (40 mg total) by mouth daily. 90 tablet 1   No current facility-administered medications for this visit.    Allergies as of 07/09/2021 - Review Complete 07/09/2021  Allergen Reaction Noted   Amoxil [amoxicillin]  07/02/2015    Family History  Problem Relation Age of Onset   Stroke Mother    Alcohol abuse Father    Heart disease Father 22       MI    Social History   Socioeconomic History   Marital status:  Married    Spouse name: Not on file   Number of children: Not on file   Years of education: Not on file   Highest education level: Not on file  Occupational History   Not on file  Tobacco Use   Smoking status: Never   Smokeless tobacco: Never  Vaping Use   Vaping Use: Never used  Substance and Sexual Activity   Alcohol use: No   Drug use: No   Sexual activity: Yes    Birth control/protection: Post-menopausal  Other Topics Concern   Not on file  Social History Narrative   Not on file   Social Determinants of Health   Financial Resource Strain: Low Risk    Difficulty of Paying Living Expenses: Not hard at all  Food Insecurity: No Food Insecurity   Worried About Charity fundraiser in the Last Year: Never true   [place] in the Last Year: Never true  Transportation Needs: No Transportation Needs   Lack of Transportation (Medical): No   Lack of Transportation (Non-Medical): No  Physical Activity: Inactive   Days of Exercise per Week: 0 days   Minutes of Exercise per Session: 0 min  Stress: No Stress Concern Present   Feeling of Stress : Not at all  Social Connections: Moderately Integrated   Frequency of Communication with Friends and Family: More than three times a week   Frequency of Social Gatherings with Friends and Family: More than three times a week   Attends Religious Services: More than 4 times per year   Active Member of [employer] or Organizations: No   Attends Music therapist: Never   Marital Status: Married   Review of systems General: negative for malaise, night sweats, fever, chills, weight loss Neck: Negative for lumps, goiter, pain and significant neck swelling Resp: Negative for cough, wheezing, dyspnea at rest CV: Negative for chest pain, leg swelling, palpitations, orthopnea GI: denies melena, hematochezia, nausea, vomiting, diarrhea,dysphagia, odyonophagia, early satiety or unintentional weight loss. +constipation +upper abdominal  pain  MSK: Negative for joint pain or swelling, back pain, and muscle pain. Derm: Negative for itching or rash Psych: Denies depression, anxiety, memory loss, confusion. No homicidal or suicidal ideation.  Heme: Negative for prolonged bleeding, bruising easily, and swollen nodes. Endocrine: Negative for cold or heat intolerance, polyuria, polydipsia and goiter. Neuro: negative for tremor, gait imbalance, syncope and seizures. The remainder of the review of systems is noncontributory.  Physical Exam: BP (!) 141/93 (BP Location: Left Arm, Patient Position: Sitting, Cuff Size: Large)    Pulse (!) 101    Temp 98.6 F (37 C) (Oral)    Ht 5\' 3"  (1.6 m)    Wt 201 lb 14.4 oz (91.6 kg)    BMI 35.76 kg/m  General:   Alert and oriented. No distress noted. Pleasant and cooperative.  Head:  Normocephalic and atraumatic. Eyes:  Conjuctiva clear without scleral icterus. Mouth:  Oral mucosa pink and moist. Good dentition. No lesions. Heart: Normal rate and rhythm, s1 and s2 heart sounds present.  Lungs: Clear lung sounds in all lobes. Respirations equal and unlabored. Abdomen:  +BS, soft, on-distended. TTP of RUQ. No rebound or guarding. No HSM or masses noted. Derm: No palmar erythema or jaundice Msk:  Symmetrical without gross deformities. Normal posture. Extremities:  Without edema. Neurologic:  Alert and  oriented x4 Psych:  Alert and cooperative. Normal mood and affect.  Invalid input(s): 6 MONTHS   ASSESSMENT: Diane Watkins is a 71 y.o. female presenting today as a new patient for changes in stool consistency and RUQ pain.  Changes in stool consistency over the past year with some constipation and feelings of insufficient bowel movements. Patient has never had colonoscopy in the past. We will proceed with colonoscopy for further evaluation of her symptoms, though given lack of red flags, this is likely from age related changes. We discussed increasing fruits, veggies, whole grains and staying  well hydrated. She can try doing miralax daily with uptitration instructions provided.   She has also had some RUQ pain with radiation to her RUQ back for some time now. She is s/p cholecystectomy. Sometimes pain improves with BM. Abdominal exam with mild TTP of RUQ, we will proceed with further evaluation of her pain with EGD, as CT A/P previously for same symptoms was unremarkable. She endorses feeling full often and unable to eat as much as previously. It is likely this could be functional abdominal pain or pain related to insufficient emptying of her bowels, however, we cannot rule out gastritis, PUD, duodenitis.   PLAN:  Schedule colonoscopy and EGD 2. Start taking Miralax 1 capful every day for one week. If bowel movements do not improve, increase to 1 capful every 12 hours. If after two weeks there is no improvement, increase to 1 capful every 8 hours 3. Increase fruits, veggies, whole grains (kiwi and prunes especially) 4. Continue to stay well hydrated with plenty of water 5. Pt to let me know if she develops new or worsening symptoms   Follow Up: TBD  Reisa Coppola L. Alver Sorrow, MSN, APRN, AGNP-C Adult-Gerontology Nurse Practitioner [facility]

## 2021-07-09 NOTE — Patient Instructions (Signed)
We will get you set up for EGD and colonoscopy for further evaluation of upper abdominal pain and changes in stool habits. Please stay well hydrated with plenty of water, eat a diet high in fruits, veggies and whole grains, kiwi and prunes are very good to help with constipation.  Start taking Miralax 1 capful every day for one week. If bowel movements do not improve, increase to 1 capful every 12 hours. If after two weeks there is no improvement, increase to 1 capful every 8 hours  Please let me know if you develop any new or worsening GI symptoms

## 2021-07-15 ENCOUNTER — Encounter (INDEPENDENT_AMBULATORY_CARE_PROVIDER_SITE_OTHER): Payer: Self-pay

## 2021-07-15 ENCOUNTER — Other Ambulatory Visit (INDEPENDENT_AMBULATORY_CARE_PROVIDER_SITE_OTHER): Payer: Self-pay

## 2021-07-15 ENCOUNTER — Telehealth (INDEPENDENT_AMBULATORY_CARE_PROVIDER_SITE_OTHER): Payer: Self-pay

## 2021-07-15 DIAGNOSIS — R195 Other fecal abnormalities: Secondary | ICD-10-CM

## 2021-07-15 DIAGNOSIS — R1011 Right upper quadrant pain: Secondary | ICD-10-CM

## 2021-07-15 DIAGNOSIS — R140 Abdominal distension (gaseous): Secondary | ICD-10-CM

## 2021-07-15 MED ORDER — PEG 3350-KCL-NA BICARB-NACL 420 G PO SOLR: 4000.0000 mL | ORAL | 0 refills | Status: DC

## 2021-07-15 NOTE — Patient Instructions (Signed)
DEBIE ASHLINE  07/15/2021     @PREFPERIOPPHARMACY @   Your procedure is scheduled on  07/24/2021.   Report to [facility] at  [place].   Call this number if you have problems the morning of surgery:  [phone]   Remember:  Follow the diet and prep instructions given to you by the office.    Take these medicines the morning of surgery with A SIP OF WATER                                         zyrtec.     Do not wear jewelry, make-up or nail polish.  Do not wear lotions, powders, or perfumes, or deodorant.  Do not shave 48 hours prior to surgery.  Men may shave face and neck.  Do not bring valuables to the hospital.  [facility] is not responsible for any belongings or valuables.  Contacts, dentures or bridgework may not be worn into surgery.  Leave your suitcase in the car.  After surgery it may be brought to your room.  For patients admitted to the hospital, discharge time will be determined by your treatment team.  Patients discharged the day of surgery will not be allowed to drive home and must have someone with them for 24 hours.    Special instructions:  DO NOT smoke tobacco or vape for 24 hours before your procedure.  Please read over the following fact sheets that you were given. Anesthesia Post-op Instructions and Care and Recovery After Surgery      Upper Endoscopy, Adult, Care After This sheet gives you information about how to care for yourself after your procedure. Your health care provider may also give you more specific instructions. If you have problems or questions, contact your health care provider. What can I expect after the procedure? After the procedure, it is common to have: A sore throat. Mild stomach pain or discomfort. Bloating. Nausea. Follow these instructions at home:  Follow instructions from your health care provider about what to eat or drink after your procedure. Return to your normal activities as told by your  health care provider. Ask your health care provider what activities are safe for you. Take over-the-counter and prescription medicines only as told by your health care provider. If you were given a sedative during the procedure, it can affect you for several hours. Do not drive or operate machinery until your health care provider says that it is safe. Keep all follow-up visits as told by your health care provider. This is important. Contact a health care provider if you have: A sore throat that lasts longer than one day. Trouble swallowing. Get help right away if: You vomit blood or your vomit looks like coffee grounds. You have: A fever. Bloody, black, or tarry stools. A severe sore throat or you cannot swallow. Difficulty breathing. Severe pain in your chest or abdomen. Summary After the procedure, it is common to have a sore throat, mild stomach discomfort, bloating, and nausea. If you were given a sedative during the procedure, it can affect you for several hours. Do not drive or operate machinery until your health care provider says that it is safe. Follow instructions from your health care provider about what to eat or drink after your procedure. Return to your normal activities as told by your health  care provider. This information is not intended to replace advice given to you by your health care provider. Make sure you discuss any questions you have with your health care provider. Document Revised: 04/08/2019 Document Reviewed: 11/02/2017 Elsevier Patient Education  2022 [place]. Colonoscopy, Adult, Care After This sheet gives you information about how to care for yourself after your procedure. Your health care provider may also give you more specific instructions. If you have problems or questions, contact your health care provider. What can I expect after the procedure? After the procedure, it is common to have: A small amount of blood in your stool for 24 hours after the  procedure. Some gas. Mild cramping or bloating of your abdomen. Follow these instructions at home: Eating and drinking  Drink enough fluid to keep your urine pale yellow. Follow instructions from your health care provider about eating or drinking restrictions. Resume your normal diet as instructed by your health care provider. Avoid heavy or fried foods that are hard to digest. Activity Rest as told by your health care provider. Avoid sitting for a long time without moving. Get up to take short walks every 1-2 hours. This is important to improve blood flow and breathing. Ask for help if you feel weak or unsteady. Return to your normal activities as told by your health care provider. Ask your health care provider what activities are safe for you. Managing cramping and bloating  Try walking around when you have cramps or feel bloated. Apply heat to your abdomen as told by your health care provider. Use the heat source that your health care provider recommends, such as a moist heat pack or a heating pad. Place a towel between your skin and the heat source. Leave the heat on for 20-30 minutes. Remove the heat if your skin turns bright red. This is especially important if you are unable to feel pain, heat, or cold. You may have a greater risk of getting burned. General instructions If you were given a sedative during the procedure, it can affect you for several hours. Do not drive or operate machinery until your health care provider says that it is safe. For the first 24 hours after the procedure: Do not sign important documents. Do not drink alcohol. Do your regular daily activities at a slower pace than normal. Eat soft foods that are easy to digest. Take over-the-counter and prescription medicines only as told by your health care provider. Keep all follow-up visits as told by your health care provider. This is important. Contact a health care provider if: You have blood in your stool 2-3  days after the procedure. Get help right away if you have: More than a small spotting of blood in your stool. Large blood clots in your stool. Swelling of your abdomen. Nausea or vomiting. A fever. Increasing pain in your abdomen that is not relieved with medicine. Summary After the procedure, it is common to have a small amount of blood in your stool. You may also have mild cramping and bloating of your abdomen. If you were given a sedative during the procedure, it can affect you for several hours. Do not drive or operate machinery until your health care provider says that it is safe. Get help right away if you have a lot of blood in your stool, nausea or vomiting, a fever, or increased pain in your abdomen. This information is not intended to replace advice given to you by your health care provider. Make sure you discuss  any questions you have with your health care provider. Document Revised: 04/08/2019 Document Reviewed: 12/27/2018 Elsevier Patient Education  [place] After This sheet gives you information about how to care for yourself after your procedure. Your health care provider may also give you more specific instructions. If you have problems or questions, contact your health care provider. What can I expect after the procedure? After the procedure, it is common to have: Tiredness. Forgetfulness about what happened after the procedure. Impaired judgment for important decisions. Nausea or vomiting. Some difficulty with balance. Follow these instructions at home: For the time period you were told by your health care provider:   Rest as needed. Do not participate in activities where you could fall or become injured. Do not drive or use machinery. Do not drink alcohol. Do not take sleeping pills or medicines that cause drowsiness. Do not make important decisions or sign legal documents. Do not take care of children on your own. Eating  and drinking Follow the diet that is recommended by your health care provider. Drink enough fluid to keep your urine pale yellow. If you vomit: Drink water, juice, or soup when you can drink without vomiting. Make sure you have little or no nausea before eating solid foods. General instructions Have a responsible adult stay with you for the time you are told. It is important to have someone help care for you until you are awake and alert. Take over-the-counter and prescription medicines only as told by your health care provider. If you have sleep apnea, surgery and certain medicines can increase your risk for breathing problems. Follow instructions from your health care provider about wearing your sleep device: Anytime you are sleeping, including during daytime naps. While taking prescription pain medicines, sleeping medicines, or medicines that make you drowsy. Avoid smoking. Keep all follow-up visits as told by your health care provider. This is important. Contact a health care provider if: You keep feeling nauseous or you keep vomiting. You feel light-headed. You are still sleepy or having trouble with balance after 24 hours. You develop a rash. You have a fever. You have redness or swelling around the IV site. Get help right away if: You have trouble breathing. You have new-onset confusion at home. Summary For several hours after your procedure, you may feel tired. You may also be forgetful and have poor judgment. Have a responsible adult stay with you for the time you are told. It is important to have someone help care for you until you are awake and alert. Rest as told. Do not drive or operate machinery. Do not drink alcohol or take sleeping pills. Get help right away if you have trouble breathing, or if you suddenly become confused. This information is not intended to replace advice given to you by your health care provider. Make sure you discuss any questions you have with your  health care provider. Document Revised: 02/16/2020 Document Reviewed: 05/05/2019 Elsevier Patient Education  2022 [employer].

## 2021-07-15 NOTE — Telephone Encounter (Signed)
Hodges Treiber Ann Manya Balash, CMA  ?

## 2021-07-16 ENCOUNTER — Encounter (INDEPENDENT_AMBULATORY_CARE_PROVIDER_SITE_OTHER): Payer: Self-pay

## 2021-07-19 ENCOUNTER — Encounter (HOSPITAL_COMMUNITY)
Admission: RE | Admit: 2021-07-19 | Discharge: 2021-07-19 | Disposition: A | Discharge status: Home / Self Care | Payer: PPO | Source: Ambulatory Visit | Attending: Internal Medicine | Admitting: Internal Medicine

## 2021-07-19 DIAGNOSIS — Z01818 Encounter for other preprocedural examination: Secondary | ICD-10-CM | POA: Insufficient documentation

## 2021-07-19 DIAGNOSIS — R195 Other fecal abnormalities: Secondary | ICD-10-CM | POA: Insufficient documentation

## 2021-07-19 DIAGNOSIS — R1011 Right upper quadrant pain: Secondary | ICD-10-CM | POA: Insufficient documentation

## 2021-07-19 DIAGNOSIS — R140 Abdominal distension (gaseous): Secondary | ICD-10-CM | POA: Diagnosis not present

## 2021-07-19 LAB — BASIC METABOLIC PANEL
Anion gap: 8 (ref 5–15)
BUN: 20 mg/dL (ref 8–23)
CO2: 25 mmol/L (ref 22–32)
Calcium: 9.4 mg/dL (ref 8.9–10.3)
Chloride: 105 mmol/L (ref 98–111)
Creatinine, Ser: 1.07 mg/dL — ABNORMAL HIGH (ref 0.44–1.00)
GFR, Estimated: 57 mL/min — ABNORMAL LOW (ref >60)
Glucose, Bld: 142 mg/dL — ABNORMAL HIGH (ref 70–99)
Potassium: 4.2 mmol/L (ref 3.5–5.1)
Sodium: 138 mmol/L (ref 135–145)

## 2021-07-19 NOTE — Progress Notes (Signed)
EKG reviewed by Dr Charna Elizabeth.  No orders given.

## 2021-07-23 ENCOUNTER — Encounter (INDEPENDENT_AMBULATORY_CARE_PROVIDER_SITE_OTHER): Payer: Self-pay | Admitting: Internal Medicine

## 2021-07-24 ENCOUNTER — Encounter (HOSPITAL_COMMUNITY)
Admission: RE | Disposition: A | Discharge status: Home / Self Care | Payer: Self-pay | Source: Ambulatory Visit | Attending: Internal Medicine

## 2021-07-24 ENCOUNTER — Encounter (HOSPITAL_COMMUNITY): Payer: Self-pay | Admitting: Internal Medicine

## 2021-07-24 ENCOUNTER — Ambulatory Visit (HOSPITAL_COMMUNITY)
Admission: RE | Admit: 2021-07-24 | Discharge: 2021-07-24 | Disposition: A | Discharge status: Home / Self Care | Payer: PPO | Source: Ambulatory Visit | Attending: Internal Medicine | Admitting: Internal Medicine

## 2021-07-24 ENCOUNTER — Ambulatory Visit (HOSPITAL_COMMUNITY): Payer: PPO | Admitting: Anesthesiology

## 2021-07-24 DIAGNOSIS — R140 Abdominal distension (gaseous): Secondary | ICD-10-CM | POA: Diagnosis not present

## 2021-07-24 DIAGNOSIS — K5900 Constipation, unspecified: Secondary | ICD-10-CM | POA: Diagnosis not present

## 2021-07-24 DIAGNOSIS — R1011 Right upper quadrant pain: Secondary | ICD-10-CM | POA: Diagnosis not present

## 2021-07-24 DIAGNOSIS — R194 Change in bowel habit: Secondary | ICD-10-CM | POA: Diagnosis not present

## 2021-07-24 DIAGNOSIS — K2100 Gastro-esophageal reflux disease with esophagitis, without bleeding: Secondary | ICD-10-CM | POA: Insufficient documentation

## 2021-07-24 DIAGNOSIS — M1990 Unspecified osteoarthritis, unspecified site: Secondary | ICD-10-CM | POA: Diagnosis not present

## 2021-07-24 DIAGNOSIS — K635 Polyp of colon: Secondary | ICD-10-CM | POA: Diagnosis not present

## 2021-07-24 DIAGNOSIS — K5730 Diverticulosis of large intestine without perforation or abscess without bleeding: Secondary | ICD-10-CM | POA: Insufficient documentation

## 2021-07-24 DIAGNOSIS — K2970 Gastritis, unspecified, without bleeding: Secondary | ICD-10-CM | POA: Diagnosis not present

## 2021-07-24 DIAGNOSIS — D123 Benign neoplasm of transverse colon: Secondary | ICD-10-CM | POA: Diagnosis not present

## 2021-07-24 DIAGNOSIS — K222 Esophageal obstruction: Secondary | ICD-10-CM | POA: Diagnosis not present

## 2021-07-24 DIAGNOSIS — K644 Residual hemorrhoidal skin tags: Secondary | ICD-10-CM | POA: Diagnosis not present

## 2021-07-24 DIAGNOSIS — I10 Essential (primary) hypertension: Secondary | ICD-10-CM | POA: Insufficient documentation

## 2021-07-24 DIAGNOSIS — Z6835 Body mass index (BMI) 35.0-35.9, adult: Secondary | ICD-10-CM | POA: Diagnosis not present

## 2021-07-24 DIAGNOSIS — K3189 Other diseases of stomach and duodenum: Secondary | ICD-10-CM | POA: Diagnosis not present

## 2021-07-24 DIAGNOSIS — R195 Other fecal abnormalities: Secondary | ICD-10-CM

## 2021-07-24 DIAGNOSIS — K449 Diaphragmatic hernia without obstruction or gangrene: Secondary | ICD-10-CM | POA: Insufficient documentation

## 2021-07-24 DIAGNOSIS — R1013 Epigastric pain: Secondary | ICD-10-CM | POA: Insufficient documentation

## 2021-07-24 HISTORY — PX: ESOPHAGOGASTRODUODENOSCOPY (EGD) WITH PROPOFOL: SHX5813

## 2021-07-24 HISTORY — PX: BIOPSY: SHX5522

## 2021-07-24 HISTORY — PX: COLONOSCOPY WITH PROPOFOL: SHX5780

## 2021-07-24 HISTORY — PX: POLYPECTOMY: SHX5525

## 2021-07-24 SURGERY — COLONOSCOPY WITH PROPOFOL
Anesthesia: General

## 2021-07-24 MED ORDER — PANTOPRAZOLE SODIUM 40 MG PO TBEC: 40.0000 mg | DELAYED_RELEASE_TABLET | Freq: Every day | ORAL | 5 refills | Status: DC

## 2021-07-24 MED ORDER — LIDOCAINE HCL 1 % IJ SOLN
INTRAMUSCULAR | Status: DC | PRN
  Administered 2021-07-24: 50 mg via INTRADERMAL

## 2021-07-24 MED ORDER — PROPOFOL 500 MG/50ML IV EMUL
INTRAVENOUS | Status: DC | PRN
  Administered 2021-07-24: 150 ug/kg/min via INTRAVENOUS

## 2021-07-24 MED ORDER — PROPOFOL 10 MG/ML IV BOLUS
INTRAVENOUS | Status: DC | PRN
  Administered 2021-07-24 (×2): 10 mg via INTRAVENOUS
  Administered 2021-07-24: 20 mg via INTRAVENOUS
  Administered 2021-07-24: 60 mg via INTRAVENOUS

## 2021-07-24 MED ORDER — LACTATED RINGERS IV SOLN: INTRAVENOUS | Status: DC

## 2021-07-24 NOTE — Op Note (Signed)
[facility] Patient Name: Diane Watkins Procedure Date: 07/24/2021 12:44 PM MRN: [id number] Date of Birth: 09-Sep-1950 Attending MD: Hildred Laser , MD CSN: [id number] Age: 71 Admit Type: Outpatient Procedure:                Colonoscopy Indications:              Change in bowel habits Providers:                Hildred Laser, MD, Hughie Closs RN, RN, Tammy                            Vaught, RN, Aram Candela Referring MD:             Barnie Del Cook, DO Medicines:                Propofol per Anesthesia Complications:            No immediate complications. Estimated Blood Loss:     Estimated blood loss was minimal. Procedure:                Pre-Anesthesia Assessment:                           - Prior to the procedure, a History and Physical                            was performed, and patient medications and                            allergies were reviewed. The patient's tolerance of                            previous anesthesia was also reviewed. The risks                            and benefits of the procedure and the sedation                            options and risks were discussed with the patient.                            All questions were answered, and informed consent                            was obtained. Prior Anticoagulants: The patient has                            taken no previous anticoagulant or antiplatelet                            agents except for NSAID medication. ASA Grade                            Assessment: II - A patient with mild systemic  disease. After reviewing the risks and benefits,                            the patient was deemed in satisfactory condition to                            undergo the procedure.                           After obtaining informed consent, the colonoscope                            was passed under direct vision. Throughout the                            procedure, the patient's blood  pressure, pulse, and                            oxygen saturations were monitored continuously. The                            [id number]-[id number] ([id number]) scope was introduced through                            the anus and advanced to the the cecum, identified                            by appendiceal orifice and ileocecal valve. The                            colonoscopy was performed without difficulty. The                            patient tolerated the procedure well. The quality                            of the bowel preparation was good. The ileocecal                            valve, appendiceal orifice, and rectum were                            photographed. Scope In: 12:46:29 PM Scope Out: 1:04:08 PM Scope Withdrawal Time: 0 hours 10 minutes 25 seconds  Total Procedure Duration: 0 hours 17 minutes 39 seconds  Findings:      A small polyp was found in the transverse colon. The polyp was sessile.       Biopsies were taken with a cold forceps for histology. The pathology       specimen was placed into Bottle Number 2.      A 4 mm polyp was found in the transverse colon. The polyp was sessile.       The polyp was removed with a cold snare. Resection and retrieval were       complete. The pathology specimen was placed into Bottle  Number 2.      Scattered diverticula were found in the sigmoid colon.      External hemorrhoids were found during retroflexion. The hemorrhoids       were small. Impression:               - One small polyp in the transverse colon. Biopsied.                           - One 4 mm polyp in the transverse colon, removed                            with a cold snare. Resected and retrieved.                           - Diverticulosis in the sigmoid colon.                           - External hemorrhoids. Moderate Sedation:      Per Anesthesia Care Recommendation:           - Patient has a contact number available for                            emergencies. The signs  and symptoms of potential                            delayed complications were discussed with the                            patient. Return to normal activities tomorrow.                            Written discharge instructions were provided to the                            patient.                           - High fiber diet today.                           - Continue present medications.                           - No aspirin, ibuprofen, naproxen, or other                            non-steroidal anti-inflammatory drugs for 1 day.                           - Await pathology results.                           - Repeat colonoscopy is recommended. The                            colonoscopy  date will be determined after pathology                            results from today's exam become available for                            review. Procedure Code(s):        --- Professional ---                           [phone], Colonoscopy, flexible; with removal of                            tumor(s), polyp(s), or other lesion(s) by snare                            technique                           45380, 2, Colonoscopy, flexible; with biopsy,                            single or multiple Diagnosis Code(s):        --- Professional ---                           K63.5, Polyp of colon                           K64.4, Residual hemorrhoidal skin tags                           R19.4, Change in bowel habit                           K57.30, Diverticulosis of large intestine without                            perforation or abscess without bleeding CPT copyright 2019 American Medical Association. All rights reserved. The codes documented in this report are preliminary and upon coder review may  be revised to meet current compliance requirements. Hildred Laser, MD Hildred Laser, MD 07/24/2021 1:21:17 PM This report has been signed electronically. Number of Addenda: 0

## 2021-07-24 NOTE — Discharge Instructions (Signed)
No aspirin or NSAIDs for 24 hours Resume other medications as before. Pantoprazole 40 mg by mouth 30 minutes before breakfast daily. High-fiber diet. No driving for 24 hours. Physician will call with biopsy results.

## 2021-07-24 NOTE — Anesthesia Postprocedure Evaluation (Signed)
Anesthesia Post Note  Patient: Diane Watkins  Procedure(s) Performed: COLONOSCOPY WITH PROPOFOL ESOPHAGOGASTRODUODENOSCOPY (EGD) WITH PROPOFOL BIOPSY POLYPECTOMY  Patient location during evaluation: Short Stay Anesthesia Type: General Level of consciousness: awake and alert Pain management: pain level controlled Vital Signs Assessment: post-procedure vital signs reviewed and stable Respiratory status: spontaneous breathing Cardiovascular status: blood pressure returned to baseline and stable Postop Assessment: no apparent nausea or vomiting Anesthetic complications: no   No notable events documented.   Last Vitals:  Vitals:   07/24/21 1107  BP: (!) 158/92  Pulse: 98  Resp: 17  Temp: 36.8 C  SpO2: 97%    Last Pain:  Vitals:   07/24/21 1230  TempSrc:   PainSc: 0-No pain                 [name]

## 2021-07-24 NOTE — H&P (Signed)
Diane Watkins is an 71 y.o. female.   Chief Complaint: Patient is here for esophagogastroduodenoscopy and colonoscopy. HPI: Patient is 71 year old Caucasian female who presents with 1 year history of intermittent epigastric and right upper quadrant pain.  She feels bloated.  She says this discomfort has not gone away even though she has not had any food for more than a day and has been prep for colonoscopy.  She also reports change in her bowel habits with constipation and sense of incomplete evacuation of few months duration.  No history of melena or rectal bleeding.  No nausea vomiting or heartburn.  Her appetite is good.  She has not lost any weight.  Patient takes ibuprofen 400 mg no more than twice a week for arthritis.  Occasionally she may take 3 doses per week. Patient has never been screened for colon cancer. Family history is negative for colon carcinoma. Social Past Medical History:  Diagnosis Date   Arthritis    High cholesterol    Hyperlipidemia    Hypertension     Past Surgical History:  Procedure Laterality Date   CATARACT EXTRACTION W/PHACO Left 03/29/2018   Procedure: CATARACT EXTRACTION PHACO AND INTRAOCULAR LENS PLACEMENT ([place]);  Surgeon: [name], MD;  Location: [facility];  Service: Ophthalmology;  Laterality: Left;  CDE: 10.13   CHOLECYSTECTOMY     TUBAL LIGATION      Family History  Problem Relation Age of Onset   Stroke Mother    Alcohol abuse Father    Heart disease Father 42       MI   Social History:  reports that she has never smoked. She has never used smokeless tobacco. She reports that she does not drink alcohol and does not use drugs.  Allergies:  Allergies  Allergen Reactions   Amoxil [Amoxicillin]     headaches Has patient had a PCN reaction causing immediate rash, facial/tongue/throat swelling, SOB or lightheadedness with hypotension: No Has patient had a PCN reaction causing severe rash involving mucus membranes or skin necrosis: No Has  patient had a PCN reaction that required hospitalization: No Has patient had a PCN reaction occurring within the last 10 years: No If all of the above answers are "NO", then may proceed with Cephalosporin use.     Medications Prior to Admission  Medication Sig Dispense Refill   cetirizine (ZYRTEC) 10 MG tablet Take 10 mg by mouth daily as needed (allergies).     ibuprofen (ADVIL) 200 MG tablet Take 400 mg by mouth every 6 (six) hours as needed for mild pain.     lisinopril (ZESTRIL) 20 MG tablet Take 1 tablet (20 mg total) by mouth daily. 90 tablet 1   magnesium hydroxide (MILK OF MAGNESIA) 400 MG/5ML suspension Take 30 mLs by mouth daily as needed for mild constipation.     Multiple Vitamins-Minerals (EMERGEN-C IMMUNE) PACK Take 1 Package by mouth daily. Zinc     naproxen (NAPROSYN) 375 MG tablet Take 1 tablet (375 mg total) by mouth 2 (two) times daily as needed for moderate pain or mild pain. PRN pain 60 tablet 0   omega-3 acid ethyl esters (LOVAZA) 1 g capsule Take 2 capsules (2 g total) by mouth 2 (two) times daily. 360 capsule 1   Polyethyl Glycol-Propyl Glycol 0.4-0.3 % SOLN Place 1-2 drops into both eyes daily. Dry eyes     polyethylene glycol (MIRALAX / GLYCOLAX) 17 g packet Take 17 g by mouth daily as needed for mild constipation or moderate constipation.  polyethylene glycol-electrolytes (TRILYTE) 420 g solution Take 4,000 mLs by mouth as directed. 4000 mL 0   rosuvastatin (CRESTOR) 40 MG tablet Take 1 tablet (40 mg total) by mouth daily. 90 tablet 1   acetaminophen (TYLENOL) 325 MG tablet Take 325-650 mg by mouth every 6 (six) hours as needed for moderate pain or mild pain.     hydrochlorothiazide (HYDRODIURIL) 25 MG tablet Take 1 tablet (25 mg total) by mouth daily. (Patient taking differently: Take 25 mg by mouth daily as needed (Summer for swelling).) 30 tablet 2    No results found for this or any previous visit (from the past 48 hour(s)). No results found.  Review of  Systems  Blood pressure (!) 158/92, pulse 98, temperature 98.3 F (36.8 C), temperature source Oral, resp. rate 17, SpO2 97 %. Physical Exam HENT:     Mouth/Throat:     Mouth: Mucous membranes are moist.     Pharynx: Oropharynx is clear.  Eyes:     General: No scleral icterus.    Conjunctiva/sclera: Conjunctivae normal.  Cardiovascular:     Rate and Rhythm: Normal rate and regular rhythm.     Heart sounds: Normal heart sounds. No murmur heard. Pulmonary:     Effort: Pulmonary effort is normal.     Breath sounds: Normal breath sounds.  Abdominal:     Comments: Abdomen is full.  On palpation is soft.  She has mild midepigastric tenderness.  No organomegaly or masses.  Musculoskeletal:        General: No deformity.     Cervical back: Neck supple.  Lymphadenopathy:     Cervical: No cervical adenopathy.  Skin:    General: Skin is warm and dry.  Neurological:     Mental Status: She is alert.     Assessment/Plan  Right upper quadrant/epigastric pain and postprandial bloating Change in bowel habits. Diagnostic esophagogastroduodenoscopy and colonoscopy.  Hildred Laser, MD 07/24/2021, 12:24 PM

## 2021-07-24 NOTE — Op Note (Signed)
[facility] Patient Name: Diane Watkins Procedure Date: 07/24/2021 12:12 PM MRN: [id number] Date of Birth: 1951-05-08 Attending MD: Hildred Laser , MD CSN: [id number] Age: 71 Admit Type: Outpatient Procedure:                Upper GI endoscopy Indications:              Epigastric abdominal pain, Abdominal pain in the                            right upper quadrant, Abdominal bloating Providers:                Hildred Laser, MD, Hughie Closs RN, RN, Tammy                            Vaught, RN, Aram Candela Referring MD:              Medicines:                Propofol per Anesthesia Complications:            No immediate complications. Estimated Blood Loss:     Estimated blood loss was minimal. Procedure:                Pre-Anesthesia Assessment:                           - Prior to the procedure, a History and Physical                            was performed, and patient medications and                            allergies were reviewed. The patient's tolerance of                            previous anesthesia was also reviewed. The risks                            and benefits of the procedure and the sedation                            options and risks were discussed with the patient.                            All questions were answered, and informed consent                            was obtained. Prior Anticoagulants: The patient has                            taken no previous anticoagulant or antiplatelet                            agents except for NSAID medication. ASA Grade  Assessment: II - A patient with mild systemic                            disease. After reviewing the risks and benefits,                            the patient was deemed in satisfactory condition to                            undergo the procedure.                           After obtaining informed consent, the endoscope was                            passed under  direct vision. Throughout the                            procedure, the patient's blood pressure, pulse, and                            oxygen saturations were monitored continuously. The                            [id number]-H190 ([id number]) scope was introduced through the                            mouth, and advanced to the second part of duodenum.                            The upper GI endoscopy was accomplished without                            difficulty. The patient tolerated the procedure                            well. Scope In: 12:34:47 PM Scope Out: 12:42:04 PM Total Procedure Duration: 0 hours 7 minutes 17 seconds  Findings:      The hypopharynx was normal.      The proximal esophagus and mid esophagus were normal.      LA Grade A (one or more mucosal breaks less than 5 mm, not extending       between tops of 2 mucosal folds) esophagitis was found 34 to 35 cm from       the incisors.      The Z-line was regular and was found 35 cm from the incisors. Mild       stricture at GE junction      A 2 cm hiatal hernia was present.      Patchy mild inflammation characterized by congestion (edema), erythema       and granularity was found in the gastric antrum and in the prepyloric       region of the stomach. Biopsies were taken with a cold forceps for       histology. The pathology specimen was placed into Bottle Number 1.  The exam of the stomach was otherwise normal.      The duodenal bulb and second portion of the duodenum were normal. Impression:               - Normal hypopharynx.                           - Normal proximal esophagus and mid esophagus.                           - LA Grade A reflux esophagitis.                           - Z-line regular, 35 cm from the incisors. Mild                            stricture at GE junction.                           - 2 cm hiatal hernia.                           - Gastritis. Biopsied.                           - Normal duodenal  bulb and second portion of the                            duodenum. Moderate Sedation:      Per Anesthesia Care Recommendation:           - Patient has a contact number available for                            emergencies. The signs and symptoms of potential                            delayed complications were discussed with the                            patient. Return to normal activities tomorrow.                            Written discharge instructions were provided to the                            patient.                           - Resume previous diet today.                           - Continue present medications.                           - No aspirin, ibuprofen, naproxen, or other  non-steroidal anti-inflammatory drugs for 1 day.                           - Await pathology results.                           - Use Protonix (pantoprazole) 40 mg PO qam.. Procedure Code(s):        --- Professional ---                           [phone], Esophagogastroduodenoscopy, flexible,                            transoral; with biopsy, single or multiple Diagnosis Code(s):        --- Professional ---                           K21.00, Gastro-esophageal reflux disease with                            esophagitis, without bleeding                           K44.9, Diaphragmatic hernia without obstruction or                            gangrene                           K29.70, Gastritis, unspecified, without bleeding                           R10.13, Epigastric pain                           R10.11, Right upper quadrant pain                           R14.0, Abdominal distension (gaseous) CPT copyright 2019 American Medical Association. All rights reserved. The codes documented in this report are preliminary and upon coder review may  be revised to meet current compliance requirements. Hildred Laser, MD Hildred Laser, MD 07/24/2021 1:15:47 PM This report has been signed  electronically. Number of Addenda: 0

## 2021-07-24 NOTE — Anesthesia Preprocedure Evaluation (Signed)
Anesthesia Evaluation  Patient identified by MRN, date of birth, ID band Patient awake    Reviewed: Allergy & Precautions, H&P , NPO status , Patient's Chart, lab work & pertinent test results, reviewed documented beta blocker date and time   Airway Mallampati: II  TM Distance: >3 FB Neck ROM: full    Dental no notable dental hx.    Pulmonary neg pulmonary ROS,    Pulmonary exam normal breath sounds clear to auscultation       Cardiovascular Exercise Tolerance: Good hypertension, negative cardio ROS   Rhythm:regular Rate:Normal     Neuro/Psych negative neurological ROS  negative psych ROS   GI/Hepatic negative GI ROS, Neg liver ROS,   Endo/Other  Morbid obesity  Renal/GU negative Renal ROS  negative genitourinary   Musculoskeletal   Abdominal   Peds  Hematology negative hematology ROS (+)   Anesthesia Other Findings   Reproductive/Obstetrics negative OB ROS                             Anesthesia Physical Anesthesia Plan  ASA: 2  Anesthesia Plan: General   Post-op Pain Management:    Induction:   PONV Risk Score and Plan: Propofol infusion  Airway Management Planned:   Additional Equipment:   Intra-op Plan:   Post-operative Plan:   Informed Consent: I have reviewed the patients History and Physical, chart, labs and discussed the procedure including the risks, benefits and alternatives for the proposed anesthesia with the patient or authorized representative who has indicated his/her understanding and acceptance.     Dental Advisory Given  Plan Discussed with: CRNA  Anesthesia Plan Comments:         Anesthesia Quick Evaluation

## 2021-07-24 NOTE — Transfer of Care (Signed)
Immediate Anesthesia Transfer of Care Note  Patient: Diane Watkins  Procedure(s) Performed: COLONOSCOPY WITH PROPOFOL ESOPHAGOGASTRODUODENOSCOPY (EGD) WITH PROPOFOL BIOPSY POLYPECTOMY  Patient Location: Short Stay  Anesthesia Type:General  Level of Consciousness: awake  Airway & Oxygen Therapy: Patient Spontanous Breathing  Post-op Assessment: Report given to RN  Post vital signs: Reviewed and stable  Last Vitals:  Vitals Value Taken Time  BP    Temp    Pulse    Resp    SpO2      Last Pain:  Vitals:   07/24/21 1230  TempSrc:   PainSc: 0-No pain         Complications: No notable events documented.

## 2021-07-26 LAB — SURGICAL PATHOLOGY

## 2021-08-13 ENCOUNTER — Ambulatory Visit (INDEPENDENT_AMBULATORY_CARE_PROVIDER_SITE_OTHER): Payer: PPO | Admitting: Internal Medicine

## 2021-08-13 ENCOUNTER — Other Ambulatory Visit: Payer: Self-pay

## 2021-08-13 ENCOUNTER — Encounter (INDEPENDENT_AMBULATORY_CARE_PROVIDER_SITE_OTHER): Payer: Self-pay | Admitting: Internal Medicine

## 2021-08-13 VITALS — BP 147/74 | HR 73 | Temp 99.6°F | Ht 63.0 in | Wt 198.9 lb

## 2021-08-13 DIAGNOSIS — K5900 Constipation, unspecified: Secondary | ICD-10-CM

## 2021-08-13 DIAGNOSIS — K439 Ventral hernia without obstruction or gangrene: Secondary | ICD-10-CM | POA: Insufficient documentation

## 2021-08-13 MED ORDER — METAMUCIL SMOOTH TEXTURE 58.6 % PO POWD: 1.0000 | Freq: Every day | ORAL | Status: DC

## 2021-08-13 MED ORDER — POLYETHYLENE GLYCOL 3350 17 G PO PACK: 8.5000 g | PACK | Freq: Every day | ORAL | 5 refills | Status: DC

## 2021-08-13 MED ORDER — DOCUSATE SODIUM 100 MG PO CAPS: 200.0000 mg | ORAL_CAPSULE | Freq: Every day | ORAL | Status: DC

## 2021-08-13 NOTE — Progress Notes (Addendum)
Presenting complaint;  Follow-up for abdominal pain/ventral hernia.  Database and subjective:  Patient is 70 year old Caucasian female who has been experiencing upper abdominal pain intermittently for the last 1 year.  She underwent EGD last month.  She also had colonoscopy for change in bowel habits.  No abnormality was noted on the studies to explain her pain. When I contacted patient to go over the biopsy results she told me that she is still having intermittent pain and she notices a knot in upper abdomen.  On reviewing her CT from 09/27/2020 I noted she had a ventral hernia.  I therefore felt that this may be the source of her intermittent pain.  Therefore this visit was arranged. She denies nausea vomiting anorexia or weight loss.  She does complain of having difficulty with bowel movements.  She feels stool is in the rectum and she is unable to push it out.  She has to strain.  Her last pelvic exam was about a year ago.  Current Medications: Outpatient Encounter Medications as of 08/13/2021  Medication Sig   acetaminophen (TYLENOL) 325 MG tablet Take 325-650 mg by mouth every 6 (six) hours as needed for moderate pain or mild pain.   cetirizine (ZYRTEC) 10 MG tablet Take 10 mg by mouth daily as needed (allergies).   hydrochlorothiazide (HYDRODIURIL) 25 MG tablet Take 1 tablet (25 mg total) by mouth daily. (Patient taking differently: Take 25 mg by mouth daily as needed (Summer for swelling).)   ibuprofen (ADVIL) 200 MG tablet Take 400 mg by mouth every 6 (six) hours as needed for mild pain.   lisinopril (ZESTRIL) 20 MG tablet Take 1 tablet (20 mg total) by mouth daily.   magnesium hydroxide (MILK OF MAGNESIA) 400 MG/5ML suspension Take 30 mLs by mouth daily as needed for mild constipation.   Multiple Vitamins-Minerals (EMERGEN-C IMMUNE) PACK Take 1 Package by mouth daily. Zinc   omega-3 acid ethyl esters (LOVAZA) 1 g capsule Take 2 capsules (2 g total) by mouth 2 (two) times daily.    pantoprazole (PROTONIX) 40 MG tablet Take 1 tablet (40 mg total) by mouth daily before breakfast.   Polyethyl Glycol-Propyl Glycol 0.4-0.3 % SOLN Place 1-2 drops into both eyes daily. Dry eyes   polyethylene glycol (MIRALAX / GLYCOLAX) 17 g packet Take 17 g by mouth daily as needed for mild constipation or moderate constipation.   rosuvastatin (CRESTOR) 40 MG tablet Take 1 tablet (40 mg total) by mouth daily.   No facility-administered encounter medications on file as of 08/13/2021.     Objective: Blood pressure (!) 147/74, pulse 73, temperature 99.6 F (37.6 C), temperature source Oral, height 5\' 3"  (1.6 m), weight 198 lb 14.4 oz (90.2 kg). Patient is alert and in no acute distress. No neck masses or thyromegaly noted. Abdominal exam reveals full abdomen with laparoscopy scars.  She has a lump with with cough impulse and upper abdomen to the right of midline about midway between umbilicus and xiphisternum.  It is partially reducible.  It is not tender.  Lab data. CT images from 09/27/2020 reviewed with patient  Assessment:  #1.  Ventral hernia appears to be source of patient's intermittent abdominal pain.  Therefore would recommend surgery before she gets obstruction or strangulation.  #2.  Impaired defecation.  Her symptoms suggest a rectocele.  She just had colonoscopy and there was no stricture or other abnormalities to account for the symptoms.   Plan:  Surgical consultation with Dr. Arnoldo Morale for ventral hernia repair. Patient will  make an appointment to see Ms. Derrek Monaco, NP of OB/GYN to find out if he has rectocele. If this is not the case we will consider anorectal manometry. Office visit in 3 months.

## 2021-08-13 NOTE — Patient Instructions (Signed)
Please schedule visit with your gynecologist for pelvic exam to rule out rectocele. Consultation with Dr. Arnoldo Morale

## 2021-08-14 ENCOUNTER — Encounter (HOSPITAL_COMMUNITY): Payer: Self-pay | Admitting: Internal Medicine

## 2021-08-27 ENCOUNTER — Ambulatory Visit (INDEPENDENT_AMBULATORY_CARE_PROVIDER_SITE_OTHER): Payer: PPO | Admitting: General Surgery

## 2021-08-27 ENCOUNTER — Encounter: Payer: Self-pay | Admitting: General Surgery

## 2021-08-27 ENCOUNTER — Other Ambulatory Visit: Payer: Self-pay

## 2021-08-27 VITALS — BP 144/88 | HR 91 | Temp 97.6°F | Resp 12 | Ht 63.0 in | Wt 201.0 lb

## 2021-08-27 DIAGNOSIS — K432 Incisional hernia without obstruction or gangrene: Secondary | ICD-10-CM

## 2021-08-27 NOTE — Progress Notes (Signed)
Diane Watkins; [id number]; Oct 26, 1950 ? ? ?HPI ?Patient is a 71 year old white female who was referred to my care by Dr. Laural Golden of gastroenterology for evaluation and treatment of a tender mass that was developing in the right upper portion of the patient's abdomen.  Apparently this has been bothering her for 1 year.  It seems to be increasing in size and causing her discomfort.  It is made worse with straining.  On review of a CT scan of the abdomen which was done in April 2022, a ventral hernia was noted.  The patient was unaware of this.  She denies any nausea or vomiting.  She is status post a laparoscopic cholecystectomy in the remote past. ?Past Medical History:  ?Diagnosis Date  ? Arthritis   ? High cholesterol   ? Hyperlipidemia   ? Hypertension   ? ? ?Past Surgical History:  ?Procedure Laterality Date  ? BIOPSY  07/24/2021  ? Procedure: BIOPSY;  Surgeon: Rogene Houston, MD;  Location: [facility];  Service: Endoscopy;;  polyp  ? CATARACT EXTRACTION W/PHACO Left 03/29/2018  ? Procedure: CATARACT EXTRACTION PHACO AND INTRAOCULAR LENS PLACEMENT (IOC);  Surgeon: [name], MD;  Location: [facility];  Service: Ophthalmology;  Laterality: Left;  CDE: 10.13  ? CHOLECYSTECTOMY    ? COLONOSCOPY WITH PROPOFOL N/A 07/24/2021  ? Procedure: COLONOSCOPY WITH PROPOFOL;  Surgeon: Rogene Houston, MD;  Location: [facility];  Service: Endoscopy;  Laterality: N/A;  ? ESOPHAGOGASTRODUODENOSCOPY (EGD) WITH PROPOFOL N/A 07/24/2021  ? Procedure: ESOPHAGOGASTRODUODENOSCOPY (EGD) WITH PROPOFOL;  Surgeon: Rogene Houston, MD;  Location: [facility];  Service: Endoscopy;  Laterality: N/A;  ? POLYPECTOMY  07/24/2021  ? Procedure: POLYPECTOMY;  Surgeon: Rogene Houston, MD;  Location: [facility];  Service: Endoscopy;;  ? TUBAL LIGATION    ? ? ?Family History  ?Problem Relation Age of Onset  ? Stroke Mother   ? Alcohol abuse Father   ? Heart disease Father 70  ?     MI  ? ? ?Current Outpatient Medications on File Prior to Visit   ?Medication Sig Dispense Refill  ? acetaminophen (TYLENOL) 325 MG tablet Take 325-650 mg by mouth every 6 (six) hours as needed for moderate pain or mild pain.    ? cetirizine (ZYRTEC) 10 MG tablet Take 10 mg by mouth daily as needed (allergies).    ? docusate sodium (COLACE) 100 MG capsule Take 2 capsules (200 mg total) by mouth at bedtime.    ? hydrochlorothiazide (HYDRODIURIL) 25 MG tablet Take 1 tablet (25 mg total) by mouth daily. (Patient taking differently: Take 25 mg by mouth daily as needed (Summer for swelling).) 30 tablet 2  ? ibuprofen (ADVIL) 200 MG tablet Take 400 mg by mouth every 6 (six) hours as needed for mild pain.    ? lisinopril (ZESTRIL) 20 MG tablet Take 1 tablet (20 mg total) by mouth daily. 90 tablet 1  ? magnesium hydroxide (MILK OF MAGNESIA) 400 MG/5ML suspension Take 30 mLs by mouth daily as needed for mild constipation.    ? Multiple Vitamins-Minerals (EMERGEN-C IMMUNE) PACK Take 1 Package by mouth daily. Zinc    ? omega-3 acid ethyl esters (LOVAZA) 1 g capsule Take 2 capsules (2 g total) by mouth 2 (two) times daily. 360 capsule 1  ? pantoprazole (PROTONIX) 40 MG tablet Take 1 tablet (40 mg total) by mouth daily before breakfast. 30 tablet 5  ? Polyethyl Glycol-Propyl Glycol 0.4-0.3 % SOLN Place 1-2 drops into both  eyes daily. Dry eyes    ? polyethylene glycol (MIRALAX / GLYCOLAX) 17 g packet Take 8.5 g by mouth at bedtime. 30 each 5  ? psyllium (METAMUCIL SMOOTH TEXTURE) 58.6 % powder Take 1 packet by mouth at bedtime.    ? rosuvastatin (CRESTOR) 40 MG tablet Take 1 tablet (40 mg total) by mouth daily. 90 tablet 1  ? ?No current facility-administered medications on file prior to visit.  ? ? ?Allergies  ?Allergen Reactions  ? Amoxil [Amoxicillin]   ?  headaches ?Has patient had a PCN reaction causing immediate rash, facial/tongue/throat swelling, SOB or lightheadedness with hypotension: No ?Has patient had a PCN reaction causing severe rash involving mucus membranes or skin necrosis:  No ?Has patient had a PCN reaction that required hospitalization: No ?Has patient had a PCN reaction occurring within the last 10 years: No ?If all of the above answers are "NO", then may proceed with Cephalosporin use. ?  ? ? ?Social History  ? ?Substance and Sexual Activity  ?Alcohol Use Yes  ? Comment: rare - social  ? ? ?Social History  ? ?Tobacco Use  ?Smoking Status Never  ?Smokeless Tobacco Never  ? ? ?Review of Systems  ?Constitutional: Negative.   ?HENT: Negative.    ?Eyes: Negative.   ?Respiratory: Negative.    ?Cardiovascular: Negative.   ?Gastrointestinal:  Positive for abdominal pain and heartburn.  ?Genitourinary: Negative.   ?Musculoskeletal:  Positive for back pain and joint pain.  ?Skin: Negative.   ?Neurological: Negative.   ?Endo/Heme/Allergies: Negative.   ?Psychiatric/Behavioral: Negative.    ? ?Objective  ? ?Vitals:  ? 08/27/21 1321  ?BP: (!) 144/88  ?Pulse: 91  ?Resp: 12  ?Temp: 97.6 ?F (36.4 ?C)  ?SpO2: 94%  ? ? ?Physical Exam ?Vitals reviewed.  ?Constitutional:   ?   Appearance: Normal appearance. She is not ill-appearing.  ?HENT:  ?   Head: Normocephalic and atraumatic.  ?Cardiovascular:  ?   Rate and Rhythm: Normal rate and regular rhythm.  ?   Heart sounds: Normal heart sounds. No murmur heard. ?  No friction rub. No gallop.  ?Pulmonary:  ?   Effort: Pulmonary effort is normal. No respiratory distress.  ?   Breath sounds: Normal breath sounds. No stridor. No wheezing, rhonchi or rales.  ?Abdominal:  ?   General: Bowel sounds are normal. There is no distension.  ?   Palpations: Abdomen is soft. There is no mass.  ?   Tenderness: There is no abdominal tenderness. There is no guarding or rebound.  ?   Hernia: A hernia is present.  ?   Comments: Reducible hernia below a right upper quadrant surgical incision trocar site is noted.  It appears to be approximately 3 to 5 cm in its greatest diameter.  ?Skin: ?   General: Skin is warm and dry.  ?Neurological:  ?   Mental Status: She is alert and  oriented to person, place, and time.  ? ?CT scan images personally reviewed ?Assessment  ?Incisional hernia, reducible ?Plan  ?Patient is scheduled for an incisional herniorrhaphy with mesh on 09/04/2021.  The risks and benefits of the procedure including bleeding, infection, mesh use, and the possibility of recurrence of the hernia were fully explained to the patient, who gave informed consent. ?

## 2021-08-27 NOTE — H&P (Signed)
Diane Watkins; [id number]; 03-Oct-1950 ? ? ?HPI ?Patient is a 71 year old white female who was referred to my care by Dr. Laural Golden of gastroenterology for evaluation and treatment of a tender mass that was developing in the right upper portion of the patient's abdomen.  Apparently this has been bothering her for 1 year.  It seems to be increasing in size and causing her discomfort.  It is made worse with straining.  On review of a CT scan of the abdomen which was done in April 2022, a ventral hernia was noted.  The patient was unaware of this.  She denies any nausea or vomiting.  She is status post a laparoscopic cholecystectomy in the remote past. ?Past Medical History:  ?Diagnosis Date  ? Arthritis   ? High cholesterol   ? Hyperlipidemia   ? Hypertension   ? ? ?Past Surgical History:  ?Procedure Laterality Date  ? BIOPSY  07/24/2021  ? Procedure: BIOPSY;  Surgeon: Rogene Houston, MD;  Location: [facility];  Service: Endoscopy;;  polyp  ? CATARACT EXTRACTION W/PHACO Left 03/29/2018  ? Procedure: CATARACT EXTRACTION PHACO AND INTRAOCULAR LENS PLACEMENT (IOC);  Surgeon: [name], MD;  Location: [facility];  Service: Ophthalmology;  Laterality: Left;  CDE: 10.13  ? CHOLECYSTECTOMY    ? COLONOSCOPY WITH PROPOFOL N/A 07/24/2021  ? Procedure: COLONOSCOPY WITH PROPOFOL;  Surgeon: Rogene Houston, MD;  Location: [facility];  Service: Endoscopy;  Laterality: N/A;  ? ESOPHAGOGASTRODUODENOSCOPY (EGD) WITH PROPOFOL N/A 07/24/2021  ? Procedure: ESOPHAGOGASTRODUODENOSCOPY (EGD) WITH PROPOFOL;  Surgeon: Rogene Houston, MD;  Location: [facility];  Service: Endoscopy;  Laterality: N/A;  ? POLYPECTOMY  07/24/2021  ? Procedure: POLYPECTOMY;  Surgeon: Rogene Houston, MD;  Location: [facility];  Service: Endoscopy;;  ? TUBAL LIGATION    ? ? ?Family History  ?Problem Relation Age of Onset  ? Stroke Mother   ? Alcohol abuse Father   ? Heart disease Father 59  ?     MI  ? ? ?Current Outpatient Medications on File Prior to Visit   ?Medication Sig Dispense Refill  ? acetaminophen (TYLENOL) 325 MG tablet Take 325-650 mg by mouth every 6 (six) hours as needed for moderate pain or mild pain.    ? cetirizine (ZYRTEC) 10 MG tablet Take 10 mg by mouth daily as needed (allergies).    ? docusate sodium (COLACE) 100 MG capsule Take 2 capsules (200 mg total) by mouth at bedtime.    ? hydrochlorothiazide (HYDRODIURIL) 25 MG tablet Take 1 tablet (25 mg total) by mouth daily. (Patient taking differently: Take 25 mg by mouth daily as needed (Summer for swelling).) 30 tablet 2  ? ibuprofen (ADVIL) 200 MG tablet Take 400 mg by mouth every 6 (six) hours as needed for mild pain.    ? lisinopril (ZESTRIL) 20 MG tablet Take 1 tablet (20 mg total) by mouth daily. 90 tablet 1  ? magnesium hydroxide (MILK OF MAGNESIA) 400 MG/5ML suspension Take 30 mLs by mouth daily as needed for mild constipation.    ? Multiple Vitamins-Minerals (EMERGEN-C IMMUNE) PACK Take 1 Package by mouth daily. Zinc    ? omega-3 acid ethyl esters (LOVAZA) 1 g capsule Take 2 capsules (2 g total) by mouth 2 (two) times daily. 360 capsule 1  ? pantoprazole (PROTONIX) 40 MG tablet Take 1 tablet (40 mg total) by mouth daily before breakfast. 30 tablet 5  ? Polyethyl Glycol-Propyl Glycol 0.4-0.3 % SOLN Place 1-2 drops into both  eyes daily. Dry eyes    ? polyethylene glycol (MIRALAX / GLYCOLAX) 17 g packet Take 8.5 g by mouth at bedtime. 30 each 5  ? psyllium (METAMUCIL SMOOTH TEXTURE) 58.6 % powder Take 1 packet by mouth at bedtime.    ? rosuvastatin (CRESTOR) 40 MG tablet Take 1 tablet (40 mg total) by mouth daily. 90 tablet 1  ? ?No current facility-administered medications on file prior to visit.  ? ? ?Allergies  ?Allergen Reactions  ? Amoxil [Amoxicillin]   ?  headaches ?Has patient had a PCN reaction causing immediate rash, facial/tongue/throat swelling, SOB or lightheadedness with hypotension: No ?Has patient had a PCN reaction causing severe rash involving mucus membranes or skin necrosis:  No ?Has patient had a PCN reaction that required hospitalization: No ?Has patient had a PCN reaction occurring within the last 10 years: No ?If all of the above answers are "NO", then may proceed with Cephalosporin use. ?  ? ? ?Social History  ? ?Substance and Sexual Activity  ?Alcohol Use Yes  ? Comment: rare - social  ? ? ?Social History  ? ?Tobacco Use  ?Smoking Status Never  ?Smokeless Tobacco Never  ? ? ?Review of Systems  ?Constitutional: Negative.   ?HENT: Negative.    ?Eyes: Negative.   ?Respiratory: Negative.    ?Cardiovascular: Negative.   ?Gastrointestinal:  Positive for abdominal pain and heartburn.  ?Genitourinary: Negative.   ?Musculoskeletal:  Positive for back pain and joint pain.  ?Skin: Negative.   ?Neurological: Negative.   ?Endo/Heme/Allergies: Negative.   ?Psychiatric/Behavioral: Negative.    ? ?Objective  ? ?Vitals:  ? 08/27/21 1321  ?BP: (!) 144/88  ?Pulse: 91  ?Resp: 12  ?Temp: 97.6 ?F (36.4 ?C)  ?SpO2: 94%  ? ? ?Physical Exam ?Vitals reviewed.  ?Constitutional:   ?   Appearance: Normal appearance. She is not ill-appearing.  ?HENT:  ?   Head: Normocephalic and atraumatic.  ?Cardiovascular:  ?   Rate and Rhythm: Normal rate and regular rhythm.  ?   Heart sounds: Normal heart sounds. No murmur heard. ?  No friction rub. No gallop.  ?Pulmonary:  ?   Effort: Pulmonary effort is normal. No respiratory distress.  ?   Breath sounds: Normal breath sounds. No stridor. No wheezing, rhonchi or rales.  ?Abdominal:  ?   General: Bowel sounds are normal. There is no distension.  ?   Palpations: Abdomen is soft. There is no mass.  ?   Tenderness: There is no abdominal tenderness. There is no guarding or rebound.  ?   Hernia: A hernia is present.  ?   Comments: Reducible hernia below a right upper quadrant surgical incision trocar site is noted.  It appears to be approximately 3 to 5 cm in its greatest diameter.  ?Skin: ?   General: Skin is warm and dry.  ?Neurological:  ?   Mental Status: She is alert and  oriented to person, place, and time.  ? ?CT scan images personally reviewed ?Assessment  ?Incisional hernia, reducible ?Plan  ?Patient is scheduled for an incisional herniorrhaphy with mesh on 09/04/2021.  The risks and benefits of the procedure including bleeding, infection, mesh use, and the possibility of recurrence of the hernia were fully explained to the patient, who gave informed consent. ?

## 2021-08-29 NOTE — Patient Instructions (Signed)
? ? ? ? ? ? ? Diane Watkins ? 08/29/2021  ?  ? '@PREFPERIOPPHARMACY'$ @ ? ? Your procedure is scheduled on  09/04/2021. ? ? Report to [facility] at  0700  A.M. ? ? Call this number if you have problems the morning of surgery: ? [phone] ? ? Remember: ? Do not eat or drink after midnight. ?  ?  ? Take these medicines the morning of surgery with A SIP OF WATER  ? ?Zyrtec, protonix. ? ?  ? Do not wear jewelry, make-up or nail polish. ? Do not wear lotions, powders, or perfumes, or deodorant. ? Do not shave 48 hours prior to surgery.  Men may shave face and neck. ? Do not bring valuables to the hospital. ? [facility] is not responsible for any belongings or valuables. ? ?Contacts, dentures or bridgework may not be worn into surgery.  Leave your suitcase in the car.  After surgery it may be brought to your room. ? ?For patients admitted to the hospital, discharge time will be determined by your treatment team. ? ?Patients discharged the day of surgery will not be allowed to drive home and must  have someone with them for 24 hours.  ? ? ?Special instructions:  DO NOT smoke tobacco or vape for 24 hours before your procedure. ? ?Please read over the following fact sheets that you were given. ?Coughing and Deep Breathing, Surgical Site Infection Prevention, Anesthesia Post-op Instructions, and Care and Recovery After Surgery ?  ? ? ? Open Hernia Repair, Adult, Care After ?What can I expect after the procedure? ?After the procedure, it is common to have: ?Mild discomfort. ?Slight bruising. ?Mild swelling. ?Pain in the belly (abdomen). ?A small amount of blood from the cut from surgery (incision). ?Follow these instructions at home: ?Your doctor may give you more specific instructions. If you have problems, call your doctor. ?Medicines ?Take over-the-counter and prescription medicines only as told by your doctor. ?If told, take steps to prevent problems with pooping (constipation). You may need to: ?Drink enough fluid to  keep your pee (urine) pale yellow. ?Take medicines. You will be told what medicines to take. ?Eat foods that are high in fiber. These include beans, whole grains, and fresh fruits and vegetables. ?Limit foods that are high in fat and sugar. These include fried or sweet foods. ?Ask your doctor if you should avoid driving or using machines while you are taking your medicine. ?Incision care ? ?Follow instructions from your doctor about how to take care of your incision. Make sure you: ?Wash your hands with soap and water for at least 20 seconds before and after you change your bandage (dressing). If you cannot use soap and water, use hand sanitizer. ?Change your bandage. ?Leave stitches or skin glue in place for at least 2 weeks. ?Leave tape strips alone unless you are told to take them off. You may trim the edges of the tape strips if they curl up. ?Check your incision every day for signs of infection. Check for: ?More redness, swelling, or pain. ?More fluid or blood. ?Warmth. ?Pus or a bad smell. ?Wear loose, soft clothing while your incision heals. ?Activity ? ?Rest as told by your doctor. ?Do not lift anything that is heavier than 10 lb (4.5 kg), or the limit that you are told. ?Do not play contact sports until your doctor says that this is safe. ?If you were given a sedative during your procedure, do not drive or use machines until your  doctor says that it is safe. A sedative is a medicine that helps you relax. ?Return to your normal activities when your doctor says that it is safe. ?General instructions ?Do not take baths, swim, or use a hot tub. Ask your doctor about taking showers or sponge baths. ?Hold a pillow over your belly when you cough or sneeze. This helps with pain. ?Do not smoke or use any products that contain nicotine or tobacco. If you need help quitting, ask your doctor. ?Keep all follow-up visits. ?Contact a doctor if: ?You have any of these signs of infection in or around your incision: ?More  redness, swelling, or pain. ?More fluid or blood. ?Warmth. ?Pus. ?A bad smell. ?You have a fever or chills. ?You have blood in your poop (stool). ?You have not pooped (had a bowel movement) in 2-3 days. ?Medicine does not help your pain. ?Get help right away if: ?You have chest pain, or you are short of breath. ?You feel faint or light-headed. ?You have very bad pain. ?You vomit and your pain is worse. ?You have pain, swelling, or redness in a leg. ?These symptoms may be an emergency. Get help right away. Call your local emergency services (911 in the [place] ?Do not wait to see if the symptoms will go away. ?Do not drive yourself to the hospital. ?Summary ?After this procedure, it is common to have mild discomfort, slight bruising, and mild swelling. ?Follow instructions from your doctor about how to take care of your cut from surgery (incision). Check every day for signs of infection. ?Do not lift heavy objects or play contact sports until your doctor says it is safe. ?Return to your normal activities as told by your doctor. ?This information is not intended to replace advice given to you by your health care provider. Make sure you discuss any questions you have with your health care provider. ?Document Revised: 01/16/2020 Document Reviewed: 01/16/2020 ?Elsevier Patient Education ? [place]. ?General Anesthesia, Adult, Care After ?This sheet gives you information about how to care for yourself after your procedure. Your health care provider may also give you more specific instructions. If you have problems or questions, contact your health care provider. ?What can I expect after the procedure? ?After the procedure, the following side effects are common: ?Pain or discomfort at the IV site. ?Nausea. ?Vomiting. ?Sore throat. ?Trouble concentrating. ?Feeling cold or chills. ?Feeling weak or tired. ?Sleepiness and fatigue. ?Soreness and body aches. These side effects can affect parts of the body that were not  involved in surgery. ?Follow these instructions at home: ?For the time period you were told by your health care provider: ? ?Rest. ?Do not participate in activities where you could fall or become injured. ?Do not drive or use machinery. ?Do not drink alcohol. ?Do not take sleeping pills or medicines that cause drowsiness. ?Do not make important decisions or sign legal documents. ?Do not take care of children on your own. ?Eating and drinking ?Follow any instructions from your health care provider about eating or drinking restrictions. ?When you feel hungry, start by eating small amounts of foods that are soft and easy to digest (bland), such as toast. Gradually return to your regular diet. ?Drink enough fluid to keep your urine pale yellow. ?If you vomit, rehydrate by drinking water, juice, or clear broth. ?General instructions ?If you have sleep apnea, surgery and certain medicines can increase your risk for breathing problems. Follow instructions from your health care provider about wearing your sleep device: ?Anytime  you are sleeping, including during daytime naps. ?While taking prescription pain medicines, sleeping medicines, or medicines that make you drowsy. ?Have a responsible adult stay with you for the time you are told. It is important to have someone help care for you until you are awake and alert. ?Return to your normal activities as told by your health care provider. Ask your health care provider what activities are safe for you. ?Take over-the-counter and prescription medicines only as told by your health care provider. ?If you smoke, do not smoke without supervision. ?Keep all follow-up visits as told by your health care provider. This is important. ?Contact a health care provider if: ?You have nausea or vomiting that does not get better with medicine. ?You cannot eat or drink without vomiting. ?You have pain that does not get better with medicine. ?You are unable to pass urine. ?You develop a skin  rash. ?You have a fever. ?You have redness around your IV site that gets worse. ?Get help right away if: ?You have difficulty breathing. ?You have chest pain. ?You have blood in your urine or stool, or you v

## 2021-09-02 ENCOUNTER — Encounter (HOSPITAL_COMMUNITY): Payer: Self-pay

## 2021-09-02 ENCOUNTER — Encounter (HOSPITAL_COMMUNITY)
Admission: RE | Admit: 2021-09-02 | Discharge: 2021-09-02 | Disposition: A | Discharge status: Home / Self Care | Payer: PPO | Source: Ambulatory Visit | Attending: General Surgery | Admitting: General Surgery

## 2021-09-02 VITALS — BP 159/82 | HR 87 | Temp 97.6°F | Resp 18 | Ht 63.0 in | Wt 201.0 lb

## 2021-09-02 DIAGNOSIS — Z79899 Other long term (current) drug therapy: Secondary | ICD-10-CM | POA: Insufficient documentation

## 2021-09-02 DIAGNOSIS — Z01812 Encounter for preprocedural laboratory examination: Secondary | ICD-10-CM | POA: Diagnosis not present

## 2021-09-02 LAB — BASIC METABOLIC PANEL
Anion gap: 9 (ref 5–15)
BUN: 11 mg/dL (ref 8–23)
CO2: 27 mmol/L (ref 22–32)
Calcium: 9.3 mg/dL (ref 8.9–10.3)
Chloride: 104 mmol/L (ref 98–111)
Creatinine, Ser: 0.91 mg/dL (ref 0.44–1.00)
GFR, Estimated: >60 mL/min (ref >60)
Glucose, Bld: 107 mg/dL — ABNORMAL HIGH (ref 70–99)
Potassium: 4.5 mmol/L (ref 3.5–5.1)
Sodium: 140 mmol/L (ref 135–145)

## 2021-09-04 ENCOUNTER — Other Ambulatory Visit: Payer: Self-pay

## 2021-09-04 ENCOUNTER — Ambulatory Visit (HOSPITAL_BASED_OUTPATIENT_CLINIC_OR_DEPARTMENT_OTHER): Payer: PPO | Admitting: Anesthesiology

## 2021-09-04 ENCOUNTER — Encounter (HOSPITAL_COMMUNITY)
Admission: RE | Disposition: A | Discharge status: Home / Self Care | Payer: Self-pay | Source: Ambulatory Visit | Attending: General Surgery

## 2021-09-04 ENCOUNTER — Encounter (HOSPITAL_COMMUNITY): Payer: Self-pay | Admitting: General Surgery

## 2021-09-04 ENCOUNTER — Ambulatory Visit (HOSPITAL_COMMUNITY)
Admission: RE | Admit: 2021-09-04 | Discharge: 2021-09-04 | Disposition: A | Discharge status: Home / Self Care | Payer: PPO | Source: Ambulatory Visit | Attending: General Surgery | Admitting: General Surgery

## 2021-09-04 ENCOUNTER — Ambulatory Visit (HOSPITAL_COMMUNITY): Payer: PPO | Admitting: Anesthesiology

## 2021-09-04 DIAGNOSIS — Z9049 Acquired absence of other specified parts of digestive tract: Secondary | ICD-10-CM | POA: Diagnosis not present

## 2021-09-04 DIAGNOSIS — I10 Essential (primary) hypertension: Secondary | ICD-10-CM | POA: Insufficient documentation

## 2021-09-04 DIAGNOSIS — K219 Gastro-esophageal reflux disease without esophagitis: Secondary | ICD-10-CM | POA: Diagnosis not present

## 2021-09-04 DIAGNOSIS — K430 Incisional hernia with obstruction, without gangrene: Secondary | ICD-10-CM

## 2021-09-04 HISTORY — PX: INCISIONAL HERNIA REPAIR: SHX193

## 2021-09-04 SURGERY — REPAIR, HERNIA, INCISIONAL
Anesthesia: General | Site: Abdomen

## 2021-09-04 MED ORDER — BUPIVACAINE LIPOSOME 1.3 % IJ SUSP
INTRAMUSCULAR | Status: AC
  Filled 2021-09-04: qty 20

## 2021-09-04 MED ORDER — VANCOMYCIN HCL IN DEXTROSE 1-5 GM/200ML-% IV SOLN
1000.0000 mg | INTRAVENOUS | Status: AC
  Administered 2021-09-04: 1000 mg via INTRAVENOUS
  Filled 2021-09-04: qty 200

## 2021-09-04 MED ORDER — BUPIVACAINE LIPOSOME 1.3 % IJ SUSP
INTRAMUSCULAR | Status: DC | PRN
  Administered 2021-09-04: 20 mL

## 2021-09-04 MED ORDER — HYDROMORPHONE HCL 1 MG/ML IJ SOLN: 0.2500 mg | INTRAMUSCULAR | Status: DC | PRN

## 2021-09-04 MED ORDER — ROCURONIUM BROMIDE 10 MG/ML (PF) SYRINGE
PREFILLED_SYRINGE | INTRAVENOUS | Status: AC
  Filled 2021-09-04: qty 20

## 2021-09-04 MED ORDER — LACTATED RINGERS IV SOLN
INTRAVENOUS | Status: DC
  Administered 2021-09-04: 1000 mL via INTRAVENOUS

## 2021-09-04 MED ORDER — SUCCINYLCHOLINE CHLORIDE 200 MG/10ML IV SOSY
PREFILLED_SYRINGE | INTRAVENOUS | Status: AC
  Filled 2021-09-04: qty 10

## 2021-09-04 MED ORDER — DEXAMETHASONE SODIUM PHOSPHATE 10 MG/ML IJ SOLN
INTRAMUSCULAR | Status: DC | PRN
Start: 2021-09-04 — End: 2021-09-04
  Administered 2021-09-04: 10 mg via INTRAVENOUS

## 2021-09-04 MED ORDER — CHLORHEXIDINE GLUCONATE CLOTH 2 % EX PADS: 6.0000 | MEDICATED_PAD | Freq: Once | CUTANEOUS | Status: DC

## 2021-09-04 MED ORDER — ROCURONIUM BROMIDE 10 MG/ML (PF) SYRINGE
PREFILLED_SYRINGE | INTRAVENOUS | Status: DC | PRN
  Administered 2021-09-04: 50 mg via INTRAVENOUS

## 2021-09-04 MED ORDER — MIDAZOLAM HCL 2 MG/2ML IJ SOLN
INTRAMUSCULAR | Status: AC
Start: 2021-09-04 — End: ?
  Filled 2021-09-04: qty 2

## 2021-09-04 MED ORDER — ONDANSETRON HCL 4 MG/2ML IJ SOLN
INTRAMUSCULAR | Status: AC
  Filled 2021-09-04: qty 2

## 2021-09-04 MED ORDER — MEPERIDINE HCL 50 MG/ML IJ SOLN: 6.2500 mg | INTRAMUSCULAR | Status: DC | PRN

## 2021-09-04 MED ORDER — CHLORHEXIDINE GLUCONATE 0.12 % MT SOLN
15.0000 mL | Freq: Once | OROMUCOSAL | Status: AC
  Administered 2021-09-04: 15 mL via OROMUCOSAL

## 2021-09-04 MED ORDER — ORAL CARE MOUTH RINSE: 15.0000 mL | Freq: Once | OROMUCOSAL | Status: AC

## 2021-09-04 MED ORDER — FENTANYL CITRATE (PF) 100 MCG/2ML IJ SOLN
INTRAMUSCULAR | Status: DC | PRN
  Administered 2021-09-04 (×2): 100 ug via INTRAVENOUS

## 2021-09-04 MED ORDER — FENTANYL CITRATE (PF) 250 MCG/5ML IJ SOLN
INTRAMUSCULAR | Status: AC
  Filled 2021-09-04: qty 5

## 2021-09-04 MED ORDER — ONDANSETRON HCL 4 MG/2ML IJ SOLN
INTRAMUSCULAR | Status: DC | PRN
  Administered 2021-09-04: 4 mg via INTRAVENOUS

## 2021-09-04 MED ORDER — KETOROLAC TROMETHAMINE 30 MG/ML IJ SOLN: 15.0000 mg | Freq: Once | INTRAMUSCULAR | Status: DC

## 2021-09-04 MED ORDER — LIDOCAINE HCL (CARDIAC) PF 100 MG/5ML IV SOSY
PREFILLED_SYRINGE | INTRAVENOUS | Status: DC | PRN
  Administered 2021-09-04: 60 mg via INTRATRACHEAL

## 2021-09-04 MED ORDER — 0.9 % SODIUM CHLORIDE (POUR BTL) OPTIME
TOPICAL | Status: DC | PRN
  Administered 2021-09-04: 1000 mL

## 2021-09-04 MED ORDER — SUCCINYLCHOLINE CHLORIDE 200 MG/10ML IV SOSY
PREFILLED_SYRINGE | INTRAVENOUS | Status: DC | PRN
  Administered 2021-09-04: 120 mg via INTRAVENOUS
  Administered 2021-09-04: 40 mg via INTRAVENOUS

## 2021-09-04 MED ORDER — PHENYLEPHRINE HCL (PRESSORS) 10 MG/ML IV SOLN
INTRAVENOUS | Status: DC | PRN
Start: 2021-09-04 — End: 2021-09-04
  Administered 2021-09-04: 80 ug via INTRAVENOUS
  Administered 2021-09-04 (×2): 120 ug via INTRAVENOUS
  Administered 2021-09-04: 40 ug via INTRAVENOUS
  Administered 2021-09-04 (×3): 80 ug via INTRAVENOUS

## 2021-09-04 MED ORDER — LIDOCAINE HCL (PF) 2 % IJ SOLN
INTRAMUSCULAR | Status: AC
  Filled 2021-09-04: qty 5

## 2021-09-04 MED ORDER — SUGAMMADEX SODIUM 500 MG/5ML IV SOLN
INTRAVENOUS | Status: DC | PRN
Start: 2021-09-04 — End: 2021-09-04
  Administered 2021-09-04: 300 mg via INTRAVENOUS

## 2021-09-04 MED ORDER — TRAMADOL HCL 50 MG PO TABS
50.0000 mg | ORAL_TABLET | Freq: Four times a day (QID) | ORAL | 0 refills | Status: DC | PRN
Start: 2021-09-04 — End: 2021-11-14

## 2021-09-04 MED ORDER — DEXAMETHASONE SODIUM PHOSPHATE 10 MG/ML IJ SOLN
INTRAMUSCULAR | Status: AC
Start: 2021-09-04 — End: ?
  Filled 2021-09-04: qty 1

## 2021-09-04 MED ORDER — PROPOFOL 10 MG/ML IV BOLUS
INTRAVENOUS | Status: DC | PRN
Start: 2021-09-04 — End: 2021-09-04
  Administered 2021-09-04: 180 mg via INTRAVENOUS

## 2021-09-04 MED ORDER — MIDAZOLAM HCL 2 MG/2ML IJ SOLN
INTRAMUSCULAR | Status: DC | PRN
  Administered 2021-09-04: 2 mg via INTRAVENOUS

## 2021-09-04 MED ORDER — PROPOFOL 10 MG/ML IV BOLUS
INTRAVENOUS | Status: AC
  Filled 2021-09-04: qty 20

## 2021-09-04 MED ORDER — ONDANSETRON HCL 4 MG/2ML IJ SOLN: 4.0000 mg | Freq: Once | INTRAMUSCULAR | Status: DC | PRN

## 2021-09-04 SURGICAL SUPPLY — 38 items
ADH SKN CLS APL DERMABOND .7 (GAUZE/BANDAGES/DRESSINGS) ×1
APL PRP STRL LF DISP 70% ISPRP (MISCELLANEOUS) ×1
BLADE SURG SZ11 CARB STEEL (BLADE) ×3 IMPLANT
CHLORAPREP W/TINT 26 (MISCELLANEOUS) ×3 IMPLANT
CLOTH BEACON ORANGE TIMEOUT ST (SAFETY) ×3 IMPLANT
COVER LIGHT HANDLE STERIS (MISCELLANEOUS) ×6 IMPLANT
DERMABOND ADVANCED (GAUZE/BANDAGES/DRESSINGS) ×1
DERMABOND ADVANCED .7 DNX12 (GAUZE/BANDAGES/DRESSINGS) ×2 IMPLANT
ELECT REM PT RETURN 9FT ADLT (ELECTROSURGICAL) ×2
ELECTRODE REM PT RTRN 9FT ADLT (ELECTROSURGICAL) ×2 IMPLANT
GAUZE 4X4 16PLY ~~LOC~~+RFID DBL (SPONGE) ×3 IMPLANT
GLOVE SURG LTX SZ6 (GLOVE) ×1 IMPLANT
GLOVE SURG POLYISO LF SZ7.5 (GLOVE) ×3 IMPLANT
GLOVE SURG UNDER POLY LF SZ7 (GLOVE) ×6 IMPLANT
GOWN STRL REUS W/TWL LRG LVL3 (GOWN DISPOSABLE) ×9 IMPLANT
INST SET MAJOR GENERAL (KITS) ×1 IMPLANT
KIT TURNOVER KIT A (KITS) ×3 IMPLANT
LIGASURE IMPACT 36 18CM CVD LR (INSTRUMENTS) ×1 IMPLANT
MANIFOLD NEPTUNE II (INSTRUMENTS) ×3 IMPLANT
MESH VENTRALEX ST 1-7/10 CRC S (Mesh General) ×1 IMPLANT
NDL HYPO 21X1.5 SAFETY (NEEDLE) ×2 IMPLANT
NEEDLE HYPO 21X1.5 SAFETY (NEEDLE) ×2 IMPLANT
NS IRRIG 1000ML POUR BTL (IV SOLUTION) ×3 IMPLANT
PACK MAJOR ABDOMINAL (CUSTOM PROCEDURE TRAY) ×1 IMPLANT
PAD ARMBOARD 7.5X6 YLW CONV (MISCELLANEOUS) ×3 IMPLANT
SET BASIN LINEN APH (SET/KITS/TRAYS/PACK) ×3 IMPLANT
SUT ETHIBOND 0 MO6 C/R (SUTURE) ×1 IMPLANT
SUT MNCRL AB 4-0 PS2 18 (SUTURE) ×4 IMPLANT
SUT NOVA NAB GS-22 2 2-0 T-19 (SUTURE) IMPLANT
SUT PROLENE 0 CT 1 CR/8 (SUTURE) IMPLANT
SUT SILK 2 0 (SUTURE)
SUT SILK 2-0 18XBRD TIE 12 (SUTURE) IMPLANT
SUT VIC AB 2-0 CT1 27 (SUTURE) ×2
SUT VIC AB 2-0 CT1 TAPERPNT 27 (SUTURE) ×2 IMPLANT
SUT VIC AB 3-0 SH 27 (SUTURE) ×2
SUT VIC AB 3-0 SH 27X BRD (SUTURE) ×2 IMPLANT
SUT VICRYL AB 2 0 TIES (SUTURE) IMPLANT
SYR 20ML LL LF (SYRINGE) ×6 IMPLANT

## 2021-09-04 NOTE — Interval H&P Note (Signed)
History and Physical Interval Note: ? ?09/04/2021 ?8:22 AM ? ?Diane Watkins  has presented today for surgery, with the diagnosis of Incisional hernia.  The various methods of treatment have been discussed with the patient and family. After consideration of risks, benefits and other options for treatment, the patient has consented to  Procedure(s): ?HERNIA REPAIR INCISIONAL W/MESH (N/A) as a surgical intervention.  The patient's history has been reviewed, patient examined, no change in status, stable for surgery.  I have reviewed the patient's chart and labs.  Questions were answered to the patient's satisfaction.   ? ? ?Aviva Signs ? ? ?

## 2021-09-04 NOTE — Transfer of Care (Signed)
Immediate Anesthesia Transfer of Care Note ? ?Patient: Diane Watkins ? ?Procedure(s) Performed: HERNIA REPAIR INCISIONAL W/MESH (Abdomen) ? ?Patient Location: PACU ? ?Anesthesia Type:General ? ?Level of Consciousness: awake, alert  and oriented ? ?Airway & Oxygen Therapy: Patient Spontanous Breathing and Patient connected to nasal cannula oxygen ? ?Post-op Assessment: Report given to RN and Post -op Vital signs reviewed and stable ? ?Post vital signs: Reviewed and stable ? ?Last Vitals:  ?Vitals Value Taken Time  ?BP 156/72 09/04/21 0934  ?Temp    ?Pulse 78 09/04/21 0936  ?Resp 16 09/04/21 0936  ?SpO2 100 % 09/04/21 0936  ?Vitals shown include unvalidated device data. ? ?Last Pain:  ?Vitals:  ? 09/04/21 0733  ?TempSrc: Oral  ?PainSc: 0-No pain  ?   ? ?Patients Stated Pain Goal: 8 (09/04/21 8757) ? ?Complications: No notable events documented. ?

## 2021-09-04 NOTE — Anesthesia Preprocedure Evaluation (Addendum)
Anesthesia Evaluation  ?Patient identified by MRN, date of birth, ID band ?Patient awake ? ? ? ?Reviewed: ?Allergy & Precautions, NPO status , Patient's Chart, lab work & pertinent test results ? ?History of Anesthesia Complications ?Negative for: history of anesthetic complications ? ?Airway ?Mallampati: III ? ?TM Distance: >3 FB ?Neck ROM: Full ? ? ? Dental ? ?(+) Dental Advisory Given, Chipped,  ?  ?Pulmonary ?sleep apnea (snoring??) ,  ?  ?Pulmonary exam normal ?breath sounds clear to auscultation ? ? ? ? ? ? Cardiovascular ?Exercise Tolerance: Good ?hypertension, Pt. on medications ?Normal cardiovascular exam ?Rhythm:Regular Rate:Normal ? ? ?  ?Neuro/Psych ?negative neurological ROS ? negative psych ROS  ? GI/Hepatic ?Neg liver ROS, GERD  Medicated,  ?Endo/Other  ?negative endocrine ROS ? Renal/GU ?negative Renal ROS  ?negative genitourinary ?  ?Musculoskeletal ? ?(+) Arthritis , Osteoarthritis,   ? Abdominal ?  ?Peds ?negative pediatric ROS ?(+)  Hematology ?negative hematology ROS ?(+)   ?Anesthesia Other Findings ? ? Reproductive/Obstetrics ?negative OB ROS ? ?  ? ? ? ? ? ? ? ? ? ? ? ? ? ?  ?  ? ? ? ? ? ? ? ?Anesthesia Physical ?Anesthesia Plan ? ?ASA: 2 ? ?Anesthesia Plan: General  ? ?Post-op Pain Management: Dilaudid IV  ? ?Induction: Intravenous ? ?PONV Risk Score and Plan: 4 or greater and Ondansetron, Dexamethasone and Midazolam ? ?Airway Management Planned: Oral ETT ? ?Additional Equipment:  ? ?Intra-op Plan:  ? ?Post-operative Plan: Extubation in OR ? ?Informed Consent: I have reviewed the patients History and Physical, chart, labs and discussed the procedure including the risks, benefits and alternatives for the proposed anesthesia with the patient or authorized representative who has indicated his/her understanding and acceptance.  ? ? ? ? ? ?Plan Discussed with: CRNA and Surgeon ? ?Anesthesia Plan Comments:   ? ? ? ? ? ? ?Anesthesia Quick Evaluation ? ?

## 2021-09-04 NOTE — Anesthesia Postprocedure Evaluation (Signed)
Anesthesia Post Note ? ?Patient: RIHANNA MARSEILLE ? ?Procedure(s) Performed: HERNIA REPAIR INCISIONAL W/MESH (Abdomen) ? ?Patient location during evaluation: Phase II ?Anesthesia Type: General ?Level of consciousness: awake and alert and oriented ?Pain management: pain level controlled ?Vital Signs Assessment: post-procedure vital signs reviewed and stable ?Respiratory status: spontaneous breathing, nonlabored ventilation and respiratory function stable ?Cardiovascular status: blood pressure returned to baseline and stable ?Postop Assessment: no apparent nausea or vomiting ?Anesthetic complications: no ? ? ?No notable events documented. ? ? ?Last Vitals:  ?Vitals:  ? 09/04/21 1000 09/04/21 1014  ?BP: (!) 143/78 (!) 125/97  ?Pulse:  65  ?Resp: 19 18  ?Temp:  36.7 ?C  ?SpO2: 98% 96%  ?  ?Last Pain:  ?Vitals:  ? 09/04/21 1014  ?TempSrc: Oral  ?PainSc: 3   ? ? ?  ?  ?  ?  ?  ?  ? ?Sierra Bissonette C Mariska Daffin ? ? ? ? ?

## 2021-09-04 NOTE — Op Note (Signed)
Patient:  Diane Watkins ? ?DOB:  08/06/1950 ? ?MRN:  [id number] ? ? ?Preop Diagnosis: Incisional hernia ? ?Postop Diagnosis: Same, incarcerated ? ?Procedure: Incarcerated incisional herniorrhaphy with mesh ? ?Surgeon: Aviva Signs, MD ? ?Anes: General endotracheal ? ?Indications: Patient is a 71 year old white female status post laparoscopic cholecystectomy in the remote past who presents with an incisional hernia at a trocar site.  The risks and benefits of the procedure including bleeding, infection, mesh use, and the possibility of recurrence of the hernia were fully explained to the patient, who gave informed consent. ? ?Procedure note: The patient was placed in the supine position.  After induction of general endotracheal anesthesia, the abdomen was prepped and draped using the usual sterile technique with ChloraPrep.  Surgical site confirmation was performed. ? ?The incisional hernia was at a trocar site which was just to the right of the midline in the supraumbilical region.  In order to facilitate exposure, a longitudinal incision was made over the hernia.  The dissection was taken down to the fascia.  The patient had a large hernia that had mushrooming through a 1.5 cm hernia defect.  In order to reduce the hernia, I did use a LigaSure to excise the hernia sac and omental contents.  Once this was done, I palpated the abdominal wall and no other hernia defects were noted.  A 4.3 cm Bard Ventralax ST patch was then inserted and secured to the fascia using 0 Ethibond interrupted sutures.  The overlying fascia was then reapproximated transversely using 0 Ethibond interrupted sutures.  The subcutaneous layer was reapproximated using a 3-0 Vicryl interrupted suture.  Exparel was instilled into the surrounding wound.  The skin was closed using a 4-0 Monocryl subcuticular suture.  Dermabond was applied. ? ?All tape and needle counts were correct at the end of the procedure.  The patient was extubated in the  operating room and transferred to PACU in stable condition. ? ?Complications: None ? ?EBL: Minimal ? ?Specimen: None ? ? ?  ?

## 2021-09-04 NOTE — Anesthesia Procedure Notes (Signed)
Procedure Name: Intubation ?Date/Time: 09/04/2021 8:44 AM ?Performed by: Karna Dupes, CRNA ?Pre-anesthesia Checklist: Patient identified, Emergency Drugs available, Suction available and Patient being monitored ?Patient Re-evaluated:Patient Re-evaluated prior to induction ?Oxygen Delivery Method: Circle system utilized ?Preoxygenation: Pre-oxygenation with 100% oxygen ?Induction Type: Combination inhalational/ intravenous induction ?Ventilation: Mask ventilation without difficulty ?Laryngoscope Size: Mac and 3 ?Grade View: Grade II ?Tube type: Oral ?Tube size: 7.0 mm ?Number of attempts: 1 ?Airway Equipment and Method: Stylet ?Placement Confirmation: ETT inserted through vocal cords under direct vision, positive ETCO2 and breath sounds checked- equal and bilateral ?Secured at: 21 cm ?Tube secured with: Tape ?Dental Injury: Teeth and Oropharynx as per pre-operative assessment  ? ? ? ? ?

## 2021-09-05 ENCOUNTER — Encounter (HOSPITAL_COMMUNITY): Payer: Self-pay | Admitting: General Surgery

## 2021-09-10 DIAGNOSIS — M25551 Pain in right hip: Secondary | ICD-10-CM | POA: Diagnosis not present

## 2021-09-10 DIAGNOSIS — M545 Low back pain, unspecified: Secondary | ICD-10-CM | POA: Diagnosis not present

## 2021-09-11 ENCOUNTER — Telehealth: Payer: Self-pay | Admitting: *Deleted

## 2021-09-11 NOTE — Telephone Encounter (Signed)
Received call from patient [phone]~ telephone.  ? ?Reports that she has been having increased lower back pain and was seen at [facility] on 09/10/2021. States that she was given injection x2 into lower back, but she is unsure what medication was injected. Reports that she was also given Medrol Dose Pack for pain.  ? ?Patient S/P hernia repair on 09/04/2021. Dr. Arnoldo Morale made aware and advised to hold Medrol Dose Pack as steroids may delay wound healing.  ? ?Call placed to patient and patient made aware. Agreeable to plan.  ?

## 2021-09-19 ENCOUNTER — Encounter: Payer: Self-pay | Admitting: General Surgery

## 2021-09-19 ENCOUNTER — Ambulatory Visit (INDEPENDENT_AMBULATORY_CARE_PROVIDER_SITE_OTHER): Payer: PPO | Admitting: General Surgery

## 2021-09-19 VITALS — BP 145/98 | HR 91 | Temp 98.5°F | Resp 16 | Ht 63.0 in | Wt 196.0 lb

## 2021-09-19 DIAGNOSIS — Z09 Encounter for follow-up examination after completed treatment for conditions other than malignant neoplasm: Secondary | ICD-10-CM

## 2021-09-19 NOTE — Progress Notes (Signed)
Subjective:  ?  ? Diane Watkins  ?Here for postoperative visit, status post incisional herniorrhaphy with mesh.  Patient states she is doing well.  She denies any incisional pain or swelling. ?Objective:  ? ? BP (!) 145/98   Pulse 91   Temp 98.5 ?F (36.9 ?C) (Oral)   Resp 16   Ht '5\' 3"'$  (1.6 m)   Wt 196 lb (88.9 kg)   SpO2 96%   BMI 34.72 kg/m?  ? ?General:  alert, cooperative, and no distress  ?Abdomen is soft, incision healing well.  No seroma present. ?   ? ?Assessment:  ? ? Doing well postoperatively.  ?  ?Plan:  ? ?May increase activity as able.  Follow-up here as needed. ?

## 2021-09-23 ENCOUNTER — Ambulatory Visit (INDEPENDENT_AMBULATORY_CARE_PROVIDER_SITE_OTHER): Payer: PPO | Admitting: Obstetrics & Gynecology

## 2021-09-23 ENCOUNTER — Encounter: Payer: Self-pay | Admitting: Obstetrics & Gynecology

## 2021-09-23 VITALS — BP 121/83 | HR 104 | Wt 195.0 lb

## 2021-09-23 DIAGNOSIS — K5902 Outlet dysfunction constipation: Secondary | ICD-10-CM

## 2021-09-23 DIAGNOSIS — N814 Uterovaginal prolapse, unspecified: Secondary | ICD-10-CM

## 2021-09-23 NOTE — Progress Notes (Signed)
? ?  GYN VISIT ?Patient name: Diane Watkins MRN [id number]  Date of birth: 1950-10-02 ?Chief Complaint:   ?"feels like something still needs to come out when using th ? ?History of Present Illness:   ?Diane Watkins is a 71 y.o. [phone] PM female being seen today for the following concerns ? ?Her PCP is concerned about a rectocele.  Notes feels like she needs to have a BM, but then will only have a small BM.  She used OTC medication and symptoms did improve. Denies vaginal bulge.  Denies vaginal itching, discharge or discomfort.   ?Notes constipation in that she struggles to have a BM not that the stool is hard.  Denies urinary concerns. ? ?Records reviewed- pt last seen Nov 2022 due to cervical dysplasia.  No prolapse noted at that time    ? ?No LMP recorded. Patient is postmenopausal. ? ? ?  12/18/2020  ?  2:28 PM 07/27/2020  ?  3:59 PM 07/27/2020  ?  1:42 PM 07/09/2019  ?  7:09 PM 07/18/2017  ?  5:50 PM  ?Depression screen PHQ 2/9  ?Decreased Interest 0 0 0 0 0  ?Down, Depressed, Hopeless 0 0 0 0 0  ?PHQ - 2 Score 0 0 0 0 0  ?Altered sleeping  2     ?Tired, decreased energy  0     ?Change in appetite  0     ?Feeling bad or failure about yourself   0     ?Trouble concentrating  0     ?Moving slowly or fidgety/restless  0     ?Suicidal thoughts  0     ?PHQ-9 Score  2     ?Difficult doing work/chores  Somewhat difficult     ? ? ? ?Review of Systems:   ?Pertinent items are noted in HPI ?Denies fever/chills, dizziness, headaches, visual disturbances, fatigue, shortness of breath, chest pain, abdominal pain, vomiting, bowel movements, urination, or intercourse unless otherwise stated above.  ?Pertinent History Reviewed:  ?Reviewed past medical,surgical, social, obstetrical and family history.  ?Reviewed problem list, medications and allergies. ?Physical Assessment:  ? ?Vitals:  ? 09/23/21 1507  ?BP: 121/83  ?Pulse: (!) 104  ?Weight: 195 lb (88.5 kg)  ?Body mass index is 34.54 kg/m?. ? ?     Physical Examination:  ? General  appearance: alert, well appearing, and in no distress ? Psych: mood appropriate, normal affect ? Skin: warm & dry  ? Cardiovascular: normal heart rate noted ? Respiratory: normal respiratory effort, no distress ? Abdomen: soft, non-tender  ? Pelvic: VULVA: normal appearing vulva with no masses, tenderness or lesions, VAGINA: normal appearing vagina with normal color and discharge, no lesions, CERVIX: normal appearing cervix without discharge or lesions, UTERUS: uterus is normal size, shape, consistency and nontender Stage 2 uterine prolapse appreciated, no rectocele noted, no anterior prolapse ? Extremities: no calf tenderness bilaterally ? ?Chaperone:  pt declined    ? ?Assessment & Plan:  ?1) Uterine prolapse ?-Discussed medical vs surgical intervention- reviewed risk/benefit of each option- pt declined ? ?2) Constipation ?-no rectocele noted ?-encouraged pt to continue with OTC stool softeners to help with constipation issues ? ?F/U as scheduled ? ?Return in about 1 year (around 09/24/2022) for annual in Nov 2023. ? ? ?Janyth Pupa, DO ?Attending [place] ? ? ? ?

## 2021-10-21 ENCOUNTER — Other Ambulatory Visit: Payer: Self-pay | Admitting: Family Medicine

## 2021-11-14 ENCOUNTER — Ambulatory Visit (INDEPENDENT_AMBULATORY_CARE_PROVIDER_SITE_OTHER): Payer: PPO | Admitting: Internal Medicine

## 2021-11-14 ENCOUNTER — Encounter (INDEPENDENT_AMBULATORY_CARE_PROVIDER_SITE_OTHER): Payer: Self-pay | Admitting: Internal Medicine

## 2021-11-14 DIAGNOSIS — K219 Gastro-esophageal reflux disease without esophagitis: Secondary | ICD-10-CM | POA: Insufficient documentation

## 2021-11-14 DIAGNOSIS — K2100 Gastro-esophageal reflux disease with esophagitis, without bleeding: Secondary | ICD-10-CM

## 2021-11-14 MED ORDER — PANTOPRAZOLE SODIUM 40 MG PO TBEC: 40.0000 mg | DELAYED_RELEASE_TABLET | ORAL | 3 refills | Status: DC

## 2021-11-14 NOTE — Patient Instructions (Signed)
Please call office with progress report regarding swallowing difficulty in 3 weeks.

## 2021-11-14 NOTE — Progress Notes (Signed)
Presenting complaint;  Follow-up for abdominal pain.  History of GERD.  Database and subjective:  Patient is 71 year old Caucasian female who is here for scheduled visit.  She was seen in the office on 07/09/2021 by Ms. Scherrie Gerlach, NP for epigastric/right upper quadrant pain as well as change in her bowel habits.  She underwent EGD and colonoscopy on 08/05/2021.  EGD revealed erosive reflux esophagitis and small sliding hiatal hernia.  Colonoscopy revealed sigmoid diverticulosis and 2 small polyps were removed and these were tubular adenomas. I last saw her on 08/13/2021. Thorough intermittent abdominal pain was secondary to ventral hernia.  She was therefore referred to Dr. Arnoldo Morale.  She underwent hernia repair on 09/04/2021. Patient states she has not had any more episodes of pain.  She stopped pantoprazole 1 month ago.  She complains of frequent dry cough which she feels is secondary to postnasal discharge.  She is not having any heartburn or regurgitation.  She does complain of intermittent dysphagia with solids but she has not had an episode of food impaction.  She feels dysphagia has not been progressive.  She is a slow eater and chews her food well. She also had exam by her gynecologist that she does not have rectocele. Patient states her bowel movement has become normal since her surgery.  She generally has 1 formed stool daily.  She denies melena or rectal bleeding.    Current Medications: Outpatient Encounter Medications as of 11/14/2021  Medication Sig   cetirizine (ZYRTEC) 10 MG tablet Take 10 mg by mouth daily as needed for allergies.   diclofenac Sodium (VOLTAREN) 1 % GEL Apply 2 g topically daily as needed (hip pain).   ibuprofen (ADVIL) 200 MG tablet Take 400 mg by mouth every 6 (six) hours as needed for mild pain.   lisinopril (ZESTRIL) 20 MG tablet Take 1 tablet (20 mg total) by mouth daily. FOLLOW UP APPT NEEDED   Multiple Vitamins-Minerals (EMERGEN-C IMMUNE) PACK Take 1  Package by mouth daily as needed (cold prevention). Zinc   omega-3 acid ethyl esters (LOVAZA) 1 g capsule TAKE 2 CAPSULES BY MOUTH TWICE DAILY   Polyethyl Glycol-Propyl Glycol 0.4-0.3 % SOLN Place 1-2 drops into both eyes daily.   rosuvastatin (CRESTOR) 40 MG tablet Take 1 tablet (40 mg total) by mouth daily. (Patient taking differently: Take 40 mg by mouth at bedtime.)   docusate sodium (COLACE) 100 MG capsule Take 2 capsules (200 mg total) by mouth at bedtime. (Patient not taking: Reported on 11/14/2021)   hydrochlorothiazide (HYDRODIURIL) 25 MG tablet Take 1 tablet (25 mg total) by mouth daily. (Patient not taking: Reported on 09/23/2021)   magnesium hydroxide (MILK OF MAGNESIA) 400 MG/5ML suspension Take 30 mLs by mouth daily as needed for mild constipation. (Patient not taking: Reported on 09/23/2021)   pantoprazole (PROTONIX) 40 MG tablet Take 1 tablet (40 mg total) by mouth daily before breakfast. (Patient not taking: Reported on 11/14/2021)   polyethylene glycol (MIRALAX / GLYCOLAX) 17 g packet Take 8.5 g by mouth at bedtime. (Patient not taking: Reported on 11/14/2021)   psyllium (METAMUCIL SMOOTH TEXTURE) 58.6 % powder Take 1 packet by mouth at bedtime. (Patient not taking: Reported on 11/14/2021)   traMADol (ULTRAM) 50 MG tablet Take 1 tablet (50 mg total) by mouth every 6 (six) hours as needed. (Patient not taking: Reported on 11/14/2021)   No facility-administered encounter medications on file as of 11/14/2021.     Objective: Blood pressure 105/71, pulse 96, temperature 98.1 F (36.7 C), temperature  source Oral, height '5\' 3"'$  (1.6 m), weight 195 lb (88.5 kg). Patient is alert and in no acute distress. Conjunctiva is pink. Sclera is nonicteric Oropharyngeal mucosa is normal. No neck masses or thyromegaly noted. Cardiac exam with regular rhythm normal S1 and S2. No murmur or gallop noted. Lungs are clear to auscultation. Abdomen is symmetrical.  She has vertical scar in upper abdomen to the  right of midline.  On palpation abdomen is soft and nontender with organomegaly or masses. No LE edema or clubbing noted.   Assessment:  #1.  GERD.  Patient had EGD in February 2023 revealing erosive reflux esophagitis and small sliding hiatal hernia.  She is not taking pantoprazole anymore.  Reviewing endoscopic pictures I am concerned that she is at risk to develop esophageal stricture.  Therefore would recommend PPI every other day which she is more comfortable taking.  #2.  Esophageal dysphagia.  She is having intermittent difficulty with soft foods.  She has not had an episode of food impaction.  She may need a barium study or repeat EGD if dysphagia does not improve with PPI.  #3.  History of colonic adenomas.  Colonoscopy in February 2023 revealed small tubular adenomas.  Next colonoscopy in 7 years.  #4.  History of abdominal pain secondary to ventral hernia.  She underwent surgery by Dr. Arnoldo Morale 09/04/2021 and has not had any further episodes of pain.   Plan:  Continue antireflux measures. Medication updated. Pantoprazole 40 mg by mouth every other day.  She can take it before breakfast. Patient will call with progress report in 3 weeks. Office visit in 1 year.

## 2021-11-18 ENCOUNTER — Other Ambulatory Visit: Payer: Self-pay | Admitting: Family Medicine

## 2021-11-18 DIAGNOSIS — E782 Mixed hyperlipidemia: Secondary | ICD-10-CM

## 2021-12-10 ENCOUNTER — Telehealth: Payer: Self-pay | Admitting: Family Medicine

## 2022-01-28 ENCOUNTER — Telehealth: Payer: Self-pay

## 2022-01-28 MED ORDER — LISINOPRIL 20 MG PO TABS: 20.0000 mg | ORAL_TABLET | Freq: Every day | ORAL | 0 refills | Status: DC

## 2022-01-28 NOTE — Telephone Encounter (Signed)
Encourage patient to contact the pharmacy for refills or they can request refills through [facility]  (Please schedule appointment if patient has not been seen in over a year)    WHAT [place] THIS SENT TO: [facility], [place]. [facility] [facility]   MEDICATION NAME & DOSE:lisinopril (ZESTRIL) 20 MG tablet , rosuvastatin (CRESTOR) 40 MG tablet   NOTES/COMMENTS FROM PATIENT:Pt has med check appt tomorrow       Front office please notify patient: It takes 48-72 hours to process rx refill requests Ask patient to call pharmacy to ensure rx is ready before heading there.

## 2022-01-29 ENCOUNTER — Ambulatory Visit (INDEPENDENT_AMBULATORY_CARE_PROVIDER_SITE_OTHER): Payer: PPO | Admitting: Family Medicine

## 2022-01-29 VITALS — BP 144/82 | HR 85 | Temp 98.2°F | Ht 63.0 in | Wt 198.0 lb

## 2022-01-29 DIAGNOSIS — E782 Mixed hyperlipidemia: Secondary | ICD-10-CM | POA: Diagnosis not present

## 2022-01-29 DIAGNOSIS — K219 Gastro-esophageal reflux disease without esophagitis: Secondary | ICD-10-CM | POA: Diagnosis not present

## 2022-01-29 DIAGNOSIS — Z130 Encounter for screening for diseases of the blood and blood-forming organs and certain disorders involving the immune mechanism: Secondary | ICD-10-CM

## 2022-01-29 DIAGNOSIS — R739 Hyperglycemia, unspecified: Secondary | ICD-10-CM

## 2022-01-29 DIAGNOSIS — E785 Hyperlipidemia, unspecified: Secondary | ICD-10-CM

## 2022-01-29 DIAGNOSIS — R7303 Prediabetes: Secondary | ICD-10-CM | POA: Diagnosis not present

## 2022-01-29 DIAGNOSIS — I10 Essential (primary) hypertension: Secondary | ICD-10-CM

## 2022-01-29 MED ORDER — LISINOPRIL 20 MG PO TABS: 20.0000 mg | ORAL_TABLET | Freq: Every day | ORAL | 3 refills | Status: DC

## 2022-01-29 MED ORDER — PANTOPRAZOLE SODIUM 40 MG PO TBEC: 40.0000 mg | DELAYED_RELEASE_TABLET | Freq: Every day | ORAL | 1 refills | Status: DC | PRN

## 2022-01-29 MED ORDER — ROSUVASTATIN CALCIUM 40 MG PO TABS: ORAL_TABLET | ORAL | 3 refills | Status: DC

## 2022-01-29 NOTE — Patient Instructions (Signed)
Labs today.  Continue your medications.  Follow up in 6 months.  Take care  Dr. Brixton Schnapp  

## 2022-01-30 DIAGNOSIS — R7303 Prediabetes: Secondary | ICD-10-CM | POA: Insufficient documentation

## 2022-01-30 LAB — CMP14+EGFR
ALT: 21 IU/L (ref 0–32)
AST: 24 IU/L (ref 0–40)
Albumin/Globulin Ratio: 2 (ref 1.2–2.2)
Albumin: 4.4 g/dL (ref 3.9–4.9)
Alkaline Phosphatase: 55 IU/L (ref 44–121)
BUN/Creatinine Ratio: 13 (ref 12–28)
BUN: 12 mg/dL (ref 8–27)
Bilirubin Total: 0.3 mg/dL (ref 0.0–1.2)
CO2: 24 mmol/L (ref 20–29)
Calcium: 9.7 mg/dL (ref 8.7–10.3)
Chloride: 103 mmol/L (ref 96–106)
Creatinine, Ser: 0.91 mg/dL (ref 0.57–1.00)
Globulin, Total: 2.2 g/dL (ref 1.5–4.5)
Glucose: 80 mg/dL (ref 70–99)
Potassium: 5.1 mmol/L (ref 3.5–5.2)
Sodium: 142 mmol/L (ref 134–144)
Total Protein: 6.6 g/dL (ref 6.0–8.5)
eGFR: 69 mL/min/{1.73_m2} (ref >59)

## 2022-01-30 LAB — CBC
Hematocrit: 39.9 % (ref 34.0–46.6)
Hemoglobin: 13.6 g/dL (ref 11.1–15.9)
MCH: 32 pg (ref 26.6–33.0)
MCHC: 34.1 g/dL (ref 31.5–35.7)
MCV: 94 fL (ref 79–97)
Platelets: 262 10*3/uL (ref 150–450)
RBC: 4.25 x10E6/uL (ref 3.77–5.28)
RDW: 13 % (ref 11.7–15.4)
WBC: 10.2 10*3/uL (ref 3.4–10.8)

## 2022-01-30 LAB — LIPID PANEL
Chol/HDL Ratio: 4 ratio (ref 0.0–4.4)
Cholesterol, Total: 196 mg/dL (ref 100–199)
HDL: 49 mg/dL (ref >39)
LDL Chol Calc (NIH): 94 mg/dL (ref 0–99)
Triglycerides: 322 mg/dL — ABNORMAL HIGH (ref 0–149)
VLDL Cholesterol Cal: 53 mg/dL — ABNORMAL HIGH (ref 5–40)

## 2022-01-30 LAB — HEMOGLOBIN A1C
Est. average glucose Bld gHb Est-mCnc: 123 mg/dL
Hgb A1c MFr Bld: 5.9 % — ABNORMAL HIGH (ref 4.8–5.6)

## 2022-01-30 NOTE — Assessment & Plan Note (Signed)
Stable. Continue lisinopril

## 2022-01-30 NOTE — Assessment & Plan Note (Signed)
Stable ?Continue Protonix ?

## 2022-01-30 NOTE — Assessment & Plan Note (Signed)
Fair control.  Triglycerides elevated.  Needs to be compliant with Lovaza.

## 2022-01-30 NOTE — Progress Notes (Signed)
Subjective:  Patient ID: Diane Watkins, female    DOB: 01-Sep-1950  Age: 71 y.o. MRN: [id number]  CC: Chief Complaint  Patient presents with   Hypertension   Hyperlipidemia    HPI:  71 year old female with hypertension, GERD, osteopenia, osteoarthritis, hyperlipidemia presents for follow-up.  Patient reports that she is doing well.  She does note easy bruising on the arms in particular.  BP slightly elevated today.  Patient is taking lisinopril 10 mg daily.  She is feeling well.  GERD is stable on Protonix.  Patient's hyperlipidemia is managed with Lovaza and Crestor.  Patient reports that she has not been taking Lovaza regularly.   Patient Active Problem List   Diagnosis Date Noted   Prediabetes 01/30/2022   GERD (gastroesophageal reflux disease) 11/14/2021   Hyperlipidemia 07/09/2019   Primary osteoarthritis of both knees 05/01/2015   Osteopenia 05/17/2014   Essential hypertension, benign 05/04/2013    Social Hx   Social History   Socioeconomic History   Marital status: Married    Spouse name: Not on file   Number of children: Not on file   Years of education: Not on file   Highest education level: Not on file  Occupational History   Not on file  Tobacco Use   Smoking status: Never    Passive exposure: Past   Smokeless tobacco: Never  Vaping Use   Vaping Use: Never used  Substance and Sexual Activity   Alcohol use: Yes    Comment: rare - social   Drug use: No   Sexual activity: Yes    Birth control/protection: Post-menopausal  Other Topics Concern   Not on file  Social History Narrative   Not on file   Social Determinants of Health   Financial Resource Strain: Low Risk  (12/18/2020)   Overall Financial Resource Strain (CARDIA)    Difficulty of Paying Living Expenses: Not hard at all  Food Insecurity: No Food Insecurity (12/18/2020)   Hunger Vital Sign    Worried About Running Out of Food in the Last Year: Never true    Ran Out of Food in the Last  Year: Never true  Transportation Needs: No Transportation Needs (12/18/2020)   PRAPARE - Hydrologist (Medical): No    Lack of Transportation (Non-Medical): No  Physical Activity: Inactive (12/18/2020)   Exercise Vital Sign    Days of Exercise per Week: 0 days    Minutes of Exercise per Session: 0 min  Stress: No Stress Concern Present (12/18/2020)   [place]    Feeling of Stress : Not at all  Social Connections: Moderately Integrated (12/18/2020)   Social Connection and Isolation Panel [NHANES]    Frequency of Communication with Friends and Family: More than three times a week    Frequency of Social Gatherings with Friends and Family: More than three times a week    Attends Religious Services: More than 4 times per year    Active Member of [employer] or Organizations: No    Attends Archivist Meetings: Never    Marital Status: Married    Review of Systems  Constitutional: Negative.   Respiratory: Negative.    Cardiovascular: Negative.   Hematological:  Bruises/bleeds easily.   Objective:  BP (!) 144/82   Pulse 85   Temp 98.2 F (36.8 C)   Ht _0  (1.6 m)   Wt 198 lb (89.8 kg)   SpO2 96%  BMI 35.07 kg/m      01/29/2022    2:29 PM 01/29/2022    2:07 PM 11/14/2021    3:27 PM  BP/Weight  Systolic BP 600 459 977  Diastolic BP 82 87 71  Wt. (Lbs)  198 195  BMI  35.07 kg/m2 34.54 kg/m2    Physical Exam Vitals and nursing note reviewed.  Constitutional:      General: She is not in acute distress.    Appearance: Normal appearance. She is obese. She is not ill-appearing.  HENT:     Head: Normocephalic and atraumatic.  Eyes:     General:        Right eye: No discharge.        Left eye: No discharge.     Conjunctiva/sclera: Conjunctivae normal.  Cardiovascular:     Rate and Rhythm: Normal rate and regular rhythm.  Pulmonary:     Effort: Pulmonary effort is normal.      Breath sounds: Normal breath sounds. No wheezing, rhonchi or rales.  Neurological:     Mental Status: She is alert.  Psychiatric:        Mood and Affect: Mood normal.        Behavior: Behavior normal.     Lab Results  Component Value Date   WBC 10.2 01/29/2022   HGB 13.6 01/29/2022   HCT 39.9 01/29/2022   PLT 262 01/29/2022   GLUCOSE 80 01/29/2022   CHOL 196 01/29/2022   TRIG 322 (H) 01/29/2022   HDL 49 01/29/2022   LDLCALC 94 01/29/2022   ALT 21 01/29/2022   AST 24 01/29/2022   NA 142 01/29/2022   K 5.1 01/29/2022   CL 103 01/29/2022   CREATININE 0.91 01/29/2022   BUN 12 01/29/2022   CO2 24 01/29/2022   TSH 2.830 07/08/2019   HGBA1C 5.9 (H) 01/29/2022     Assessment & Plan:   Problem List Items Addressed This Visit       Cardiovascular and Mediastinum   Essential hypertension, benign - Primary    Stable.  Continue lisinopril.      Relevant Medications   rosuvastatin (CRESTOR) 40 MG tablet   lisinopril (ZESTRIL) 20 MG tablet   Other Relevant Orders   CMP14+EGFR (Completed)     Digestive   GERD (gastroesophageal reflux disease)    Stable.  Continue Protonix.      Relevant Medications   pantoprazole (PROTONIX) 40 MG tablet     Other   Hyperlipidemia    Fair control.  Triglycerides elevated.  Needs to be compliant with Lovaza.      Relevant Medications   rosuvastatin (CRESTOR) 40 MG tablet   lisinopril (ZESTRIL) 20 MG tablet   Other Relevant Orders   Lipid panel (Completed)   Prediabetes   Other Visit Diagnoses     Blood glucose elevated       Relevant Orders   Hemoglobin A1c (Completed)   Screening for deficiency anemia       Relevant Orders   CBC (Completed)       Meds ordered this encounter  Medications   pantoprazole (PROTONIX) 40 MG tablet    Sig: Take 1 tablet (40 mg total) by mouth daily as needed.    Dispense:  90 tablet    Refill:  1   rosuvastatin (CRESTOR) 40 MG tablet    Sig: TAKE 1 TABLET(40 MG) BY MOUTH DAILY     Dispense:  90 tablet    Refill:  3  Patient needs an appointment   lisinopril (ZESTRIL) 20 MG tablet    Sig: Take 1 tablet (20 mg total) by mouth daily. FOLLOW UP APPT NEEDED    Dispense:  90 tablet    Refill:  3    Follow-up:  Return in about 6 months (around 08/01/2022).  [place]

## 2022-02-06 ENCOUNTER — Other Ambulatory Visit: Payer: Self-pay | Admitting: *Deleted

## 2022-02-06 MED ORDER — OMEGA-3-ACID ETHYL ESTERS 1 G PO CAPS: 2.0000 | ORAL_CAPSULE | Freq: Two times a day (BID) | ORAL | 0 refills | Status: DC

## 2022-04-09 ENCOUNTER — Telehealth: Payer: Self-pay | Admitting: Family Medicine

## 2022-04-09 NOTE — Telephone Encounter (Signed)
Left message for patient to call back and schedule Medicare Annual Wellness Visit (AWV) .  Please offer to do virtually or by telephone.  Last AWV: 12/18/2020  Please schedule at anytime with [facility] Advisor.  30 minute appointment for Virtual or phone  Any questions, please contact me at [phone]

## 2022-04-27 ENCOUNTER — Encounter (INDEPENDENT_AMBULATORY_CARE_PROVIDER_SITE_OTHER): Payer: Self-pay | Admitting: Gastroenterology

## 2022-05-01 ENCOUNTER — Other Ambulatory Visit: Payer: Self-pay | Admitting: Family Medicine

## 2022-05-12 ENCOUNTER — Telehealth: Payer: Self-pay | Admitting: Family Medicine

## 2022-05-12 NOTE — Telephone Encounter (Signed)
Left message for patient to call back and schedule Medicare Annual Wellness Visit (AWV) in office.   If unable to come into the office for AWV,  please offer to do virtually or by telephone.  Last AWV: 12/18/2020   Please schedule at anytime with [facility] Ad[facility].  45 minute appointment for in office or Initial or virtual/phone  Any questions, please contact me at [phone]

## 2022-05-25 ENCOUNTER — Other Ambulatory Visit: Payer: Self-pay | Admitting: Family Medicine

## 2022-08-01 ENCOUNTER — Ambulatory Visit (INDEPENDENT_AMBULATORY_CARE_PROVIDER_SITE_OTHER): Payer: PPO | Admitting: Family Medicine

## 2022-08-01 VITALS — BP 100/68 | HR 101 | Temp 97.7°F | Ht 63.0 in | Wt 200.0 lb

## 2022-08-01 DIAGNOSIS — E785 Hyperlipidemia, unspecified: Secondary | ICD-10-CM | POA: Diagnosis not present

## 2022-08-01 DIAGNOSIS — E782 Mixed hyperlipidemia: Secondary | ICD-10-CM | POA: Diagnosis not present

## 2022-08-01 DIAGNOSIS — K219 Gastro-esophageal reflux disease without esophagitis: Secondary | ICD-10-CM

## 2022-08-01 DIAGNOSIS — R7303 Prediabetes: Secondary | ICD-10-CM | POA: Diagnosis not present

## 2022-08-01 DIAGNOSIS — I10 Essential (primary) hypertension: Secondary | ICD-10-CM | POA: Diagnosis not present

## 2022-08-01 MED ORDER — ROSUVASTATIN CALCIUM 40 MG PO TABS: ORAL_TABLET | ORAL | 3 refills | Status: DC

## 2022-08-01 MED ORDER — PANTOPRAZOLE SODIUM 40 MG PO TBEC: 40.0000 mg | DELAYED_RELEASE_TABLET | Freq: Every day | ORAL | 3 refills | Status: DC | PRN

## 2022-08-01 MED ORDER — OMEGA-3-ACID ETHYL ESTERS 1 G PO CAPS: ORAL_CAPSULE | ORAL | 3 refills | Status: DC

## 2022-08-01 MED ORDER — LISINOPRIL 20 MG PO TABS
ORAL_TABLET | ORAL | 3 refills | Status: DC
Start: 2022-08-01 — End: 2023-08-03

## 2022-08-01 NOTE — Assessment & Plan Note (Signed)
Stable.  Protonix as directed.

## 2022-08-01 NOTE — Progress Notes (Signed)
Subjective:  Patient ID: Diane Watkins, female    DOB: [birth year]  Age: 72 y.o. MRN: [id number]  CC: Follow up  HPI:  72 year old female with the below mentioned medical problems presents for follow-up.  Hypertension is well-controlled on lisinopril.  She is doing well at this time.  GERD stable on Protonix.  Lipids have been reasonably well-controlled.  Last LDL was 94.  Triglycerides have been elevated.  She has not been very compliant with Lovaza.  She reports intermittent arthralgias but states that she is managing quite well.  She takes over-the-counter medications as needed.  Patient Active Problem List   Diagnosis Date Noted   Prediabetes 01/30/2022   GERD (gastroesophageal reflux disease) 11/14/2021   Hyperlipidemia 07/09/2019   Primary osteoarthritis of both knees 05/01/2015   Osteopenia 05/17/2014   Essential hypertension, benign 05/04/2013    Social Hx   Social History   Socioeconomic History   Marital status: Married    Spouse name: Not on file   Number of children: Not on file   Years of education: Not on file   Highest education level: Not on file  Occupational History   Not on file  Tobacco Use   Smoking status: Never    Passive exposure: Past   Smokeless tobacco: Never  Vaping Use   Vaping Use: Never used  Substance and Sexual Activity   Alcohol use: Yes    Comment: rare - social   Drug use: No   Sexual activity: Yes    Birth control/protection: Post-menopausal  Other Topics Concern   Not on file  Social History Narrative   Not on file   Social Determinants of Health   Financial Resource Strain: Low Risk  (12/18/2020)   Overall Financial Resource Strain (CARDIA)    Difficulty of Paying Living Expenses: Not hard at all  Food Insecurity: No Food Insecurity (12/18/2020)   Hunger Vital Sign    Worried About Running Out of Food in the Last Year: Never true    Ran Out of Food in the Last Year: Never true  Transportation Needs: No  Transportation Needs (12/18/2020)   PRAPARE - Hydrologist (Medical): No    Lack of Transportation (Non-Medical): No  Physical Activity: Inactive (12/18/2020)   Exercise Vital Sign    Days of Exercise per Week: 0 days    Minutes of Exercise per Session: 0 min  Stress: No Stress Concern Present (12/18/2020)   [place]    Feeling of Stress : Not at all  Social Connections: Moderately Integrated (12/18/2020)   Social Connection and Isolation Panel [NHANES]    Frequency of Communication with Friends and Family: More than three times a week    Frequency of Social Gatherings with Friends and Family: More than three times a week    Attends Religious Services: More than 4 times per year    Active Member of [employer] or Organizations: No    Attends Archivist Meetings: Never    Marital Status: Married    Review of Systems  Respiratory: Negative.    Cardiovascular: Negative.   Musculoskeletal:  Positive for arthralgias.   Objective:  BP 100/68   Pulse (!) 101   Temp 97.7 F (36.5 C)   Ht 5' 3"$  (1.6 m)   Wt 200 lb (90.7 kg)   SpO2 98%   BMI 35.43 kg/m      08/01/2022    1:16 PM  01/29/2022    2:29 PM 01/29/2022    2:07 PM  BP/Weight  Systolic BP [id number]  Diastolic BP 68 82 87  Wt. (Lbs) 200  198  BMI 35.43 kg/m2  35.07 kg/m2    Physical Exam Vitals and nursing note reviewed.  Constitutional:      General: She is not in acute distress.    Appearance: Normal appearance.  HENT:     Head: Normocephalic and atraumatic.  Eyes:     General:        Right eye: No discharge.        Left eye: No discharge.     Conjunctiva/sclera: Conjunctivae normal.  Cardiovascular:     Rate and Rhythm: Normal rate and regular rhythm.  Pulmonary:     Effort: Pulmonary effort is normal.     Breath sounds: Normal breath sounds. No wheezing, rhonchi or rales.  Neurological:     Mental Status: She  is alert.  Psychiatric:        Mood and Affect: Mood normal.        Behavior: Behavior normal.     Lab Results  Component Value Date   WBC 10.2 01/29/2022   HGB 13.6 01/29/2022   HCT 39.9 01/29/2022   PLT 262 01/29/2022   GLUCOSE 80 01/29/2022   CHOL 196 01/29/2022   TRIG 322 (H) 01/29/2022   HDL 49 01/29/2022   LDLCALC 94 01/29/2022   ALT 21 01/29/2022   AST 24 01/29/2022   NA 142 01/29/2022   K 5.1 01/29/2022   CL 103 01/29/2022   CREATININE 0.91 01/29/2022   BUN 12 01/29/2022   CO2 24 01/29/2022   TSH 2.830 07/08/2019   HGBA1C 5.9 (H) 01/29/2022     Assessment & Plan:   Problem List Items Addressed This Visit       Cardiovascular and Mediastinum   Essential hypertension, benign - Primary    Stable on lisinopril.  Continue.  Labs today.      Relevant Medications   rosuvastatin (CRESTOR) 40 MG tablet   lisinopril (ZESTRIL) 20 MG tablet   omega-3 acid ethyl esters (LOVAZA) 1 g capsule   Other Relevant Orders   CMP14+EGFR     Digestive   GERD (gastroesophageal reflux disease)    Stable.  Protonix as directed.      Relevant Medications   pantoprazole (PROTONIX) 40 MG tablet     Other   Prediabetes   Relevant Orders   Hemoglobin A1c   Hyperlipidemia    Lipid panel today to assess.  Continuing Crestor and Lovaza.  Needs to be compliant with Lovaza.      Relevant Medications   rosuvastatin (CRESTOR) 40 MG tablet   lisinopril (ZESTRIL) 20 MG tablet   omega-3 acid ethyl esters (LOVAZA) 1 g capsule   Other Relevant Orders   Lipid panel    Meds ordered this encounter  Medications   rosuvastatin (CRESTOR) 40 MG tablet    Sig: TAKE 1 TABLET(40 MG) BY MOUTH DAILY    Dispense:  90 tablet    Refill:  3    Patient needs an appointment   lisinopril (ZESTRIL) 20 MG tablet    Sig: TAKE 1 TABLET(20 MG) BY MOUTH DAILY. FOLLOW UP APPOINTMENT NEEDED    Dispense:  90 tablet    Refill:  3   omega-3 acid ethyl esters (LOVAZA) 1 g capsule    Sig: TAKE 2  CAPSULE BY MOUTH TWICE DAILY    Dispense:  360  capsule    Refill:  3   pantoprazole (PROTONIX) 40 MG tablet    Sig: Take 1 tablet (40 mg total) by mouth daily as needed.    Dispense:  90 tablet    Refill:  3    Follow-up:  Return in about 6 months (around 01/30/2023).  [place]

## 2022-08-01 NOTE — Patient Instructions (Signed)
Labs when you like.  Follow up in 6 months.  You're doing well!

## 2022-08-01 NOTE — Assessment & Plan Note (Signed)
Stable on lisinopril.  Continue.  Labs today.

## 2022-08-01 NOTE — Assessment & Plan Note (Signed)
Lipid panel today to assess.  Continuing Crestor and Lovaza.  Needs to be compliant with Lovaza.

## 2022-08-02 LAB — CMP14+EGFR
ALT: 22 IU/L (ref 0–32)
AST: 28 IU/L (ref 0–40)
Albumin/Globulin Ratio: 2.3 — ABNORMAL HIGH (ref 1.2–2.2)
Albumin: 5 g/dL — ABNORMAL HIGH (ref 3.9–4.9)
Alkaline Phosphatase: 64 IU/L (ref 44–121)
BUN/Creatinine Ratio: 16 (ref 12–28)
BUN: 16 mg/dL (ref 8–27)
Bilirubin Total: 0.4 mg/dL (ref 0.0–1.2)
CO2: 23 mmol/L (ref 20–29)
Calcium: 10.3 mg/dL (ref 8.7–10.3)
Chloride: 99 mmol/L (ref 96–106)
Creatinine, Ser: 0.98 mg/dL (ref 0.57–1.00)
Globulin, Total: 2.2 g/dL (ref 1.5–4.5)
Glucose: 99 mg/dL (ref 70–99)
Potassium: 5 mmol/L (ref 3.5–5.2)
Sodium: 139 mmol/L (ref 134–144)
Total Protein: 7.2 g/dL (ref 6.0–8.5)
eGFR: 62 mL/min/{1.73_m2} (ref >59)

## 2022-08-02 LAB — LIPID PANEL
Chol/HDL Ratio: 3.6 ratio (ref 0.0–4.4)
Cholesterol, Total: 186 mg/dL (ref 100–199)
HDL: 51 mg/dL (ref >39)
LDL Chol Calc (NIH): 95 mg/dL (ref 0–99)
Triglycerides: 238 mg/dL — ABNORMAL HIGH (ref 0–149)
VLDL Cholesterol Cal: 40 mg/dL (ref 5–40)

## 2022-08-02 LAB — HEMOGLOBIN A1C
Est. average glucose Bld gHb Est-mCnc: 134 mg/dL
Hgb A1c MFr Bld: 6.3 % — ABNORMAL HIGH (ref 4.8–5.6)

## 2022-09-10 ENCOUNTER — Other Ambulatory Visit: Payer: Self-pay | Admitting: Family Medicine

## 2022-09-17 ENCOUNTER — Encounter (INDEPENDENT_AMBULATORY_CARE_PROVIDER_SITE_OTHER): Payer: Self-pay | Admitting: Gastroenterology

## 2022-09-30 ENCOUNTER — Telehealth: Payer: Self-pay | Admitting: Family Medicine

## 2022-09-30 NOTE — Telephone Encounter (Signed)
Called patient to schedule Medicare Annual Wellness Visit (AWV). Left message for patient to call back and schedule Medicare Annual Wellness Visit (AWV).  Last date of AWV: 12/18/2020   Please schedule an appointment at any time with either Vernona Rieger or [place], NHA's. .  If any questions, please contact me at [phone].  Thank you,  Judeth Cornfield,  AMB Clinical Support [facility] AWV Program Direct Dial ??[phone]

## 2022-11-17 ENCOUNTER — Ambulatory Visit (INDEPENDENT_AMBULATORY_CARE_PROVIDER_SITE_OTHER): Payer: PPO | Admitting: Gastroenterology

## 2022-12-20 DIAGNOSIS — M545 Low back pain, unspecified: Secondary | ICD-10-CM | POA: Diagnosis not present

## 2022-12-20 DIAGNOSIS — S52515A Nondisplaced fracture of left radial styloid process, initial encounter for closed fracture: Secondary | ICD-10-CM | POA: Diagnosis not present

## 2022-12-26 DIAGNOSIS — M25532 Pain in left wrist: Secondary | ICD-10-CM | POA: Diagnosis not present

## 2022-12-26 DIAGNOSIS — M545 Low back pain, unspecified: Secondary | ICD-10-CM | POA: Diagnosis not present

## 2023-01-08 ENCOUNTER — Ambulatory Visit (INDEPENDENT_AMBULATORY_CARE_PROVIDER_SITE_OTHER): Payer: PPO | Admitting: Gastroenterology

## 2023-01-30 ENCOUNTER — Ambulatory Visit (INDEPENDENT_AMBULATORY_CARE_PROVIDER_SITE_OTHER): Payer: PPO | Admitting: Family Medicine

## 2023-01-30 VITALS — BP 118/80 | HR 95 | Temp 97.7°F | Ht 63.0 in | Wt 194.6 lb

## 2023-01-30 DIAGNOSIS — M8580 Other specified disorders of bone density and structure, unspecified site: Secondary | ICD-10-CM | POA: Diagnosis not present

## 2023-01-30 DIAGNOSIS — I10 Essential (primary) hypertension: Secondary | ICD-10-CM

## 2023-01-30 DIAGNOSIS — E785 Hyperlipidemia, unspecified: Secondary | ICD-10-CM

## 2023-01-30 DIAGNOSIS — K219 Gastro-esophageal reflux disease without esophagitis: Secondary | ICD-10-CM | POA: Diagnosis not present

## 2023-01-30 NOTE — Patient Instructions (Signed)
Bone density ordered.  Continue your medications.  Follow up in 6 months.

## 2023-02-01 NOTE — Assessment & Plan Note (Signed)
DEXA to assess.

## 2023-02-01 NOTE — Assessment & Plan Note (Signed)
Continue current medications. 

## 2023-02-01 NOTE — Assessment & Plan Note (Signed)
Stable. Continue lisinopril

## 2023-02-01 NOTE — Progress Notes (Signed)
Subjective:  Patient ID: Diane Watkins, female    DOB: Apr 18, 1951  Age: 72 y.o. MRN: [id number]  CC:  Follow up   HPI:  72 year old female with the below mentioned medical problems presents for follow-up.  Overall feeling well.  No chest pain or shortness of breath.  Blood pressure well-controlled on lisinopril.  GERD stable on Protonix.  Lipids have been fairly well-controlled on Crestor and Lovaza.   Patient Active Problem List   Diagnosis Date Noted   Prediabetes 01/30/2022   GERD (gastroesophageal reflux disease) 11/14/2021   Hyperlipidemia 07/09/2019   Primary osteoarthritis of both knees 05/01/2015   Osteopenia 05/17/2014   Essential hypertension, benign 05/04/2013    Social Hx   Social History   Socioeconomic History   Marital status: Married    Spouse name: Not on file   Number of children: Not on file   Years of education: Not on file   Highest education level: Not on file  Occupational History   Not on file  Tobacco Use   Smoking status: Never    Passive exposure: Past   Smokeless tobacco: Never  Vaping Use   Vaping status: Never Used  Substance and Sexual Activity   Alcohol use: Yes    Comment: rare - social   Drug use: No   Sexual activity: Yes    Birth control/protection: Post-menopausal  Other Topics Concern   Not on file  Social History Narrative   Not on file   Social Determinants of Health   Financial Resource Strain: Low Risk  (12/18/2020)   Overall Financial Resource Strain (CARDIA)    Difficulty of Paying Living Expenses: Not hard at all  Food Insecurity: No Food Insecurity (12/18/2020)   Hunger Vital Sign    Worried About Running Out of Food in the Last Year: Never true    Ran Out of Food in the Last Year: Never true  Transportation Needs: No Transportation Needs (12/18/2020)   PRAPARE - Administrator, Civil Service (Medical): No    Lack of Transportation (Non-Medical): No  Physical Activity: Inactive (12/18/2020)    Exercise Vital Sign    Days of Exercise per Week: 0 days    Minutes of Exercise per Session: 0 min  Stress: No Stress Concern Present (12/18/2020)   [employer] of Occupational Health - Occupational Stress Questionnaire    Feeling of Stress : Not at all  Social Connections: Moderately Integrated (12/18/2020)   Social Connection and Isolation Panel [NHANES]    Frequency of Communication with Friends and Family: More than three times a week    Frequency of Social Gatherings with Friends and Family: More than three times a week    Attends Religious Services: More than 4 times per year    Active Member of [employer] or Organizations: No    Attends Engineer, structural: Never    Marital Status: Married    Review of Systems Per HPI  Objective:  BP 118/80   Pulse 95   Temp 97.7 F (36.5 C)   Ht 5\' 3"  (1.6 m)   Wt 194 lb 9.6 oz (88.3 kg)   SpO2 98%   BMI 34.47 kg/m      01/30/2023    1:40 PM 08/01/2022    1:16 PM 01/29/2022    2:29 PM  BP/Weight  Systolic BP 118 100 144  Diastolic BP 80 68 82  Wt. (Lbs) 194.6 200   BMI 34.47 kg/m2 35.43 kg/m2  Physical Exam Vitals and nursing note reviewed.  Constitutional:      General: She is not in acute distress.    Appearance: Normal appearance.  HENT:     Head: Normocephalic and atraumatic.  Eyes:     General:        Right eye: No discharge.        Left eye: No discharge.     Conjunctiva/sclera: Conjunctivae normal.  Cardiovascular:     Rate and Rhythm: Normal rate and regular rhythm.  Pulmonary:     Effort: Pulmonary effort is normal.     Breath sounds: Normal breath sounds. No wheezing, rhonchi or rales.  Neurological:     Mental Status: She is alert.  Psychiatric:        Mood and Affect: Mood normal.        Behavior: Behavior normal.     Lab Results  Component Value Date   WBC 10.2 01/29/2022   HGB 13.6 01/29/2022   HCT 39.9 01/29/2022   PLT 262 01/29/2022   GLUCOSE 99 08/01/2022   CHOL 186 08/01/2022    TRIG 238 (H) 08/01/2022   HDL 51 08/01/2022   LDLCALC 95 08/01/2022   ALT 22 08/01/2022   AST 28 08/01/2022   NA 139 08/01/2022   K 5.0 08/01/2022   CL 99 08/01/2022   CREATININE 0.98 08/01/2022   BUN 16 08/01/2022   CO2 23 08/01/2022   TSH 2.830 07/08/2019   HGBA1C 6.3 (H) 08/01/2022     Assessment & Plan:   Problem List Items Addressed This Visit       Cardiovascular and Mediastinum   Essential hypertension, benign - Primary    Stable.  Continue lisinopril.        Digestive   GERD (gastroesophageal reflux disease)    Stable on Protonix.  Continue.        Musculoskeletal and Integument   Osteopenia    DEXA to assess.      Relevant Orders   DG Bone Density     Other   Hyperlipidemia    Continue current medications.      Follow-up:  Return in about 6 months (around 08/02/2023).  Everlene Other DO [facility]

## 2023-02-01 NOTE — Assessment & Plan Note (Signed)
Stable on Protonix.  Continue. 

## 2023-02-06 DIAGNOSIS — S52515D Nondisplaced fracture of left radial styloid process, subsequent encounter for closed fracture with routine healing: Secondary | ICD-10-CM | POA: Diagnosis not present

## 2023-02-13 ENCOUNTER — Ambulatory Visit (INDEPENDENT_AMBULATORY_CARE_PROVIDER_SITE_OTHER): Payer: PPO

## 2023-02-13 VITALS — Ht 63.75 in | Wt 194.0 lb

## 2023-02-13 DIAGNOSIS — Z1231 Encounter for screening mammogram for malignant neoplasm of breast: Secondary | ICD-10-CM

## 2023-02-13 DIAGNOSIS — Z0000 Encounter for general adult medical examination without abnormal findings: Secondary | ICD-10-CM

## 2023-02-13 NOTE — Patient Instructions (Signed)
Ms. Hannemann , Thank you for taking time to come for your Medicare Wellness Visit. I appreciate your ongoing commitment to your health goals. Please review the following plan we discussed and let me know if I can assist you in the future.   Referrals/Orders/Follow-Ups/Clinician Recommendations:  You have an order for:  []   2D Mammogram  [x]   3D Mammogram  [x]   Bone Density   []   Lung Cancer Screening  Please call for appointment:   [facility] [address] [place], [place] [address] [phone]  Make sure to wear two-piece clothing.  No lotions powders or deodorants the day of the appointment Make sure to bring picture ID and insurance card.  Bring list of medications you are currently taking including any supplements.   Schedule your [place] screening mammogram through MyChart!   Log into your MyChart account.  Go to 'Visit' (or 'Appointments' if on mobile App) --> Schedule an Appointment  Under 'Select a Reason for Visit' choose the Mammogram Screening option.  Complete the pre-visit questions and select the time and place that best fits your schedule.    This is a list of the screening recommended for you and due dates:  Health Maintenance  Topic Date Due   Zoster (Shingles) Vaccine (1 of 2) 03/21/1972   DEXA scan (bone density measurement)  08/07/2021   Mammogram  03/13/2022   Flu Shot  09/14/2023*   Medicare Annual Wellness Visit  02/13/2024   DTaP/Tdap/Td vaccine (2 - Td or Tdap) 05/23/2024   Colon Cancer Screening  07/24/2028   Hepatitis C Screening  Completed   HPV Vaccine  Aged Out   Pneumonia Vaccine  Discontinued   COVID-19 Vaccine  Discontinued  *Topic was postponed. The date shown is not the original due date.    Advanced directives: (Declined) Advance directive discussed with you today. Even though you declined this today, please call our office should you change your mind, and we can give you the proper  paperwork for you to fill out.  Next Medicare Annual Wellness Visit scheduled for next year: Yes Preventive Care 76 Years and Older, Female Preventive care refers to lifestyle choices and visits with your health care provider that can promote health and wellness. Preventive care visits are also called wellness exams. What can I expect for my preventive care visit? Counseling Your health care provider may ask you questions about your: Medical history, including: Past medical problems. Family medical history. Pregnancy and menstrual history. History of falls. Current health, including: Memory and ability to understand (cognition). Emotional well-being. Home life and relationship well-being. Sexual activity and sexual health. Lifestyle, including: Alcohol, nicotine or tobacco, and drug use. Access to firearms. Diet, exercise, and sleep habits. Work and work Astronomer. Sunscreen use. Safety issues such as seatbelt and bike helmet use. Physical exam Your health care provider will check your: Height and weight. These may be used to calculate your BMI (body mass index). BMI is a measurement that tells if you are at a healthy weight. Waist circumference. This measures the distance around your waistline. This measurement also tells if you are at a healthy weight and may help predict your risk of certain diseases, such as type 2 diabetes and high blood pressure. Heart rate and blood pressure. Body temperature. Skin for abnormal spots. What immunizations do I need?  Vaccines are usually given at various ages, according to a schedule. Your health care provider will recommend vaccines for you based on  your age, medical history, and lifestyle or other factors, such as travel or where you work. What tests do I need? Screening Your health care provider may recommend screening tests for certain conditions. This may include: Lipid and cholesterol levels. Hepatitis C test. Hepatitis B  test. HIV (human immunodeficiency virus) test. STI (sexually transmitted infection) testing, if you are at risk. Lung cancer screening. Colorectal cancer screening. Diabetes screening. This is done by checking your blood sugar (glucose) after you have not eaten for a while (fasting). Mammogram. Talk with your health care provider about how often you should have regular mammograms. BRCA-related cancer screening. This may be done if you have a family history of breast, ovarian, tubal, or peritoneal cancers. Bone density scan. This is done to screen for osteoporosis. Talk with your health care provider about your test results, treatment options, and if necessary, the need for more tests. Follow these instructions at home: Eating and drinking  Eat a diet that includes fresh fruits and vegetables, whole grains, lean protein, and low-fat dairy products. Limit your intake of foods with high amounts of sugar, saturated fats, and salt. Take vitamin and mineral supplements as recommended by your health care provider. Do not drink alcohol if your health care provider tells you not to drink. If you drink alcohol: Limit how much you have to 0-1 drink a day. Know how much alcohol is in your drink. In the [place] one drink equals one 12 oz bottle of beer (355 mL), one 5 oz glass of wine (148 mL), or one 1 oz glass of hard liquor (44 mL). Lifestyle Brush your teeth every morning and night with fluoride toothpaste. Floss one time each day. Exercise for at least 30 minutes 5 or more days each week. Do not use any products that contain nicotine or tobacco. These products include cigarettes, chewing tobacco, and vaping devices, such as e-cigarettes. If you need help quitting, ask your health care provider. Do not use drugs. If you are sexually active, practice safe sex. Use a condom or other form of protection in order to prevent STIs. Take aspirin only as told by your health care provider. Make sure that you  understand how much to take and what form to take. Work with your health care provider to find out whether it is safe and beneficial for you to take aspirin daily. Ask your health care provider if you need to take a cholesterol-lowering medicine (statin). Find healthy ways to manage stress, such as: Meditation, yoga, or listening to music. Journaling. Talking to a trusted person. Spending time with friends and family. Minimize exposure to UV radiation to reduce your risk of skin cancer. Safety Always wear your seat belt while driving or riding in a vehicle. Do not drive: If you have been drinking alcohol. Do not ride with someone who has been drinking. When you are tired or distracted. While texting. If you have been using any mind-altering substances or drugs. Wear a helmet and other protective equipment during sports activities. If you have firearms in your house, make sure you follow all gun safety procedures. What's next? Visit your health care provider once a year for an annual wellness visit. Ask your health care provider how often you should have your eyes and teeth checked. Stay up to date on all vaccines. This information is not intended to replace advice given to you by your health care provider. Make sure you discuss any questions you have with your health care provider. Document Revised: 11/28/2020 Document  Reviewed: 11/28/2020 Elsevier Patient Education  2024 [employer]. Understanding Your Risk for Falls Millions of people have serious injuries from falls each year. It is important to understand your risk of falling. Talk with your health care provider about your risk and what you can do to lower it. If you do have a serious fall, make sure to tell your provider. Falling once raises your risk of falling again. How can falls affect me? Serious injuries from falls are common. These include: Broken bones, such as hip fractures. Head injuries, such as traumatic brain  injuries (TBI) or concussions. A fear of falling can cause you to avoid activities and stay at home. This can make your muscles weaker and raise your risk for a fall. What can increase my risk? There are a number of risk factors that increase your risk for falling. The more risk factors you have, the higher your risk of falling. Serious injuries from a fall happen most often to people who are older than 70 years old. Teenagers and young adults ages 29-29 are also at higher risk. Common risk factors include: Weakness in the lower body. Being generally weak or confused due to long-term (chronic) illness. Dizziness or balance problems. Poor vision. Medicines that cause dizziness or drowsiness. These may include: Medicines for your blood pressure, heart, anxiety, insomnia, or swelling (edema). Pain medicines. Muscle relaxants. Other risk factors include: Drinking alcohol. Having had a fall in the past. Having foot pain or wearing improper footwear. Working at a dangerous job. Having any of the following in your home: Tripping hazards, such as floor clutter or loose rugs. Poor lighting. Pets. Having dementia or memory loss. What actions can I take to lower my risk of falling?     Physical activity Stay physically fit. Do strength and balance exercises. Consider taking a regular class to build strength and balance. Yoga and tai chi are good options. Vision Have your eyes checked every year and your prescription for glasses or contacts updated as needed. Shoes and walking aids Wear non-skid shoes. Wear shoes that have rubber soles and low heels. Do not wear high heels. Do not walk around the house in socks or slippers. Use a cane or walker as told by your provider. Home safety Attach secure railings on both sides of your stairs. Install grab bars for your bathtub, shower, and toilet. Use a non-skid mat in your bathtub or shower. Attach bath mats securely with double-sided, non-slip  rug tape. Use good lighting in all rooms. Keep a flashlight near your bed. Make sure there is a clear path from your bed to the bathroom. Use night-lights. Do not use throw rugs. Make sure all carpeting is taped or tacked down securely. Remove all clutter from walkways and stairways, including extension cords. Repair uneven or broken steps and floors. Avoid walking on icy or slippery surfaces. Walk on the grass instead of on icy or slick sidewalks. Use ice melter to get rid of ice on walkways in the winter. Use a cordless phone. Questions to ask your health care provider Can you help me check my risk for a fall? Do any of my medicines make me more likely to fall? Should I take a vitamin D supplement? What exercises can I do to improve my strength and balance? Should I make an appointment to have my vision checked? Do I need a bone density test to check for weak bones (osteoporosis)? Would it help to use a cane or a walker? Where to find  more information Centers for Disease Control and Prevention, STEAD[employer]: TonerPromos.no Community-Based Fall Prevention Programs: TonerPromos.no [employer] on Aging: BaseRingTones.pl Contact a health care provider if: You fall at home. You are afraid of falling at home. You feel weak, drowsy, or dizzy. This information is not intended to replace advice given to you by your health care provider. Make sure you discuss any questions you have with your health care provider. Document Revised: 02/03/2022 Document Reviewed: 02/03/2022 Elsevier Patient Education  2024 [employer].

## 2023-02-13 NOTE — Progress Notes (Signed)
Because this visit was a virtual/telehealth visit,  certain criteria was not obtained, such a blood pressure, CBG if patient is a diabetic, and timed get up and go. Any medications not marked as "taking" was not mentioned during the medication reconciliation part of the visit. Any vitals not documented were not able to be obtained due to this being a telehealth visit. Vitals that have been documented are verbally provided by the patient.  Patient was unable to self-report a recent blood pressure reading due to a lack of equipment at home via telehealth.  Subjective:   Diane Watkins is a 72 y.o. female who presents for Medicare Annual (Subsequent) preventive examination.  Visit Complete: Virtual  I connected with  Diane Watkins on 02/13/23 by a audio enabled telemedicine application and verified that I am speaking with the correct person using two identifiers.  Patient Location: Home  Provider Location: Home Office  I discussed the limitations of evaluation and management by telemedicine. The patient expressed understanding and agreed to proceed.  Patient Medicare AWV questionnaire was completed by the patient on na ; I have confirmed that all information answered by patient is correct and no changes since this date.  Review of Systems     Cardiac Risk Factors include: advanced age (>40men, >40 women);dyslipidemia;hypertension;obesity (BMI >30kg/m2);sedentary lifestyle     Objective:    Today's Vitals   02/13/23 1001  Weight: 194 lb (88 kg)  Height: 5' 3.75" (1.619 m)   Body mass index is 33.56 kg/m.     02/13/2023   10:01 AM 09/04/2021    7:08 AM 09/02/2021   10:57 AM 07/24/2021   10:58 AM 07/19/2021    9:19 AM 12/18/2020    2:26 PM 09/27/2020    5:02 PM  Advanced Directives  Does Patient Have a Medical Advance Directive? No No No No No No No  Would patient like information on creating a medical advance directive? No - Patient declined No - Patient declined No - Patient  declined No - Patient declined No - Patient declined No - Patient declined     Current Medications (verified) Outpatient Encounter Medications as of 02/13/2023  Medication Sig   lisinopril (ZESTRIL) 20 MG tablet TAKE 1 TABLET(20 MG) BY MOUTH DAILY. FOLLOW UP APPOINTMENT NEEDED   omega-3 acid ethyl esters (LOVAZA) 1 g capsule TAKE 2 CAPSULES BY MOUTH TWICE DAILY   rosuvastatin (CRESTOR) 40 MG tablet TAKE 1 TABLET(40 MG) BY MOUTH DAILY   cetirizine (ZYRTEC) 10 MG tablet Take 10 mg by mouth daily as needed for allergies.   diclofenac Sodium (VOLTAREN) 1 % GEL Apply 2 g topically daily as needed (hip pain).   ibuprofen (ADVIL) 200 MG tablet Take 400 mg by mouth every 6 (six) hours as needed for mild pain.   Multiple Vitamins-Minerals (EMERGEN-C IMMUNE) PACK Take 1 Package by mouth daily as needed (cold prevention). Zinc   pantoprazole (PROTONIX) 40 MG tablet Take 1 tablet (40 mg total) by mouth daily as needed.   Polyethyl Glycol-Propyl Glycol 0.4-0.3 % SOLN Place 1-2 drops into both eyes daily.   No facility-administered encounter medications on file as of 02/13/2023.    Allergies (verified) Amoxil [amoxicillin]   History: Past Medical History:  Diagnosis Date   Arthritis    High cholesterol    Hyperlipidemia    Hypertension    Past Surgical History:  Procedure Laterality Date   BIOPSY  07/24/2021   Procedure: BIOPSY;  Surgeon: Malissa Hippo, MD;  [facility];  Service: Endoscopy;;  polyp   CATARACT EXTRACTION W/PHACO Left 03/29/2018   Procedure: CATARACT EXTRACTION PHACO AND INTRAOCULAR LENS PLACEMENT (IOC);  Surgeon: Gemma Payor, MD;  Location: [facility];  Service: Ophthalmology;  Laterality: Left;  CDE: 10.13   CHOLECYSTECTOMY     COLONOSCOPY WITH PROPOFOL N/A 07/24/2021   Procedure: COLONOSCOPY WITH PROPOFOL;  Surgeon: Malissa Hippo, MD;  [facility]: [facility];  Service: Endoscopy;  Laterality: N/A;   ESOPHAGOGASTRODUODENOSCOPY (EGD) WITH PROPOFOL N/A 07/24/2021    Procedure: ESOPHAGOGASTRODUODENOSCOPY (EGD) WITH PROPOFOL;  Surgeon: Malissa Hippo, MD;  Location: [facility];  Service: Endoscopy;  Laterality: N/A;   INCISIONAL HERNIA REPAIR N/A 09/04/2021   Procedure: HERNIA REPAIR INCISIONAL W/MESH;  Surgeon: Franky Macho, MD;  Location: [facility];  Service: General;  Laterality: N/A;   POLYPECTOMY  07/24/2021   Procedure: POLYPECTOMY;  Surgeon: Malissa Hippo, MD;  [facility];  Service: Endoscopy;;   TUBAL LIGATION     Family History  Problem Relation Age of Onset   Stroke Mother    Alcohol abuse Father    Heart disease Father 51       MI   Social History   Socioeconomic History   Marital status: Married    Spouse name: Not on file   Number of children: Not on file   Years of education: Not on file   Highest education level: Not on file  Occupational History   Not on file  Tobacco Use   Smoking status: Never    Passive exposure: Past   Smokeless tobacco: Never  Vaping Use   Vaping status: Never Used  Substance and Sexual Activity   Alcohol use: Yes    Comment: rare - social   Drug use: No   Sexual activity: Yes    Birth control/protection: Post-menopausal  Other Topics Concern   Not on file  Social History Narrative   Not on file   Social Determinants of Health   Financial Resource Strain: Low Risk  (02/13/2023)   Overall Financial Resource Strain (CARDIA)    Difficulty of Paying Living Expenses: Not hard at all  Food Insecurity: No Food Insecurity (02/13/2023)   Hunger Vital Sign    Worried About Running Out of Food in the Last Year: Never true    Ran Out of Food in the Last Year: Never true  Transportation Needs: No Transportation Needs (02/13/2023)   PRAPARE - Administrator, Civil Service (Medical): No    Lack of Transportation (Non-Medical): No  Physical Activity: Sufficiently Active (02/13/2023)   Exercise Vital Sign    Days of Exercise per Week: 7 days    Minutes of Exercise per Session: 30  min  Stress: No Stress Concern Present (02/13/2023)   [employer] of Occupational Health - Occupational Stress Questionnaire    Feeling of Stress : Not at all  Social Connections: Moderately Integrated (02/13/2023)   Social Connection and Isolation Panel [NHANES]    Frequency of Communication with Friends and Family: More than three times a week    Frequency of Social Gatherings with Friends and Family: More than three times a week    Attends Religious Services: More than 4 times per year    Active Member of [employer] or Organizations: No    Attends Banker Meetings: Never    Marital Status: Married    Tobacco Counseling Counseling given: Yes   Clinical Intake:  Pre-visit preparation completed: Yes  Pain :  No/denies pain     BMI - recorded: 35.56 Nutritional Status: BMI > 30  Obese Nutritional Risks: None Diabetes: No  How often do you need to have someone help you when you read instructions, pamphlets, or other written materials from your doctor or pharmacy?: 1 - Never  Interpreter Needed?: No  Information entered by :: Abby Anelise Staron, CMA   Activities of Daily Living    02/13/2023   10:10 AM  In your present state of health, do you have any difficulty performing the following activities:  Hearing? 0  Vision? 0  Difficulty concentrating or making decisions? 0  Walking or climbing stairs? 0  Dressing or bathing? 0  Doing errands, shopping? 0  Preparing Food and eating ? N  Using the Toilet? N  In the past six months, have you accidently leaked urine? N  Do you have problems with loss of bowel control? N  Managing your Medications? N  Managing your Finances? N  Housekeeping or managing your Housekeeping? N    Patient Care Team: Tommie Sams, DO as PCP - General (Family Medicine)  Indicate any recent Medical Services you may have received from other than [facility] providers in the past year (date may be approximate).     Assessment:   This is a  routine wellness examination for Diane Watkins.  Hearing/Vision screen Hearing Screening - Comments:: Patient denies any hearing difficulties.   Vision Screening - Comments:: Patient states they wear reading glasses only. She is utd with her yearly eye exams.   Dietary issues and exercise activities discussed:     Goals Addressed             This Visit's Progress    Patient Stated       Lose some more weight       Depression Screen    02/13/2023   10:06 AM 08/01/2022    1:21 PM 01/29/2022    2:14 PM 12/18/2020    2:28 PM 07/27/2020    3:59 PM 07/27/2020    1:42 PM 07/09/2019    7:09 PM  PHQ 2/9 Scores  PHQ - 2 Score 0 0 0 0 0 0 0  PHQ- 9 Score  0   2      Fall Risk    02/13/2023   10:09 AM 01/29/2022    2:08 PM 08/27/2021    1:21 PM 12/18/2020    2:27 PM 09/26/2020    1:32 PM  Fall Risk   Falls in the past year? 1 0 0 0 0  Number falls in past yr: 1 0  0 0  Injury with Fall? 1 0  0 0  Risk for fall due to : History of fall(s);Impaired balance/gait;Orthopedic patient No Fall Risks  No Fall Risks No Fall Risks  Follow up Education provided;Falls prevention discussed Falls evaluation completed Falls evaluation completed Falls evaluation completed;Falls prevention discussed Falls evaluation completed    MEDICARE RISK AT HOME: Medicare Risk at Home Any stairs in or around the home?: Yes If so, are there any without handrails?: Yes Home free of loose throw rugs in walkways, pet beds, electrical cords, etc?: Yes Adequate lighting in your home to reduce risk of falls?: Yes Life alert?: No Use of a cane, walker or w/c?: No Grab bars in the bathroom?: No Shower chair or bench in shower?: No Elevated toilet seat or a handicapped toilet?: No  TIMED UP AND GO:  Was the test performed?  No    Cognitive Function:  02/13/2023   10:04 AM  6CIT Screen  What Year? 0 points  What month? 0 points  What time? 0 points  Count back from 20 0 points  Months in reverse 0 points   Repeat phrase 2 points  Total Score 2 points    Immunizations Immunization History  Administered Date(s) Administered   Influenza Split 05/04/2013   Influenza,inj,Quad PF,6+ Mos 04/12/2014, 05/01/2015, 05/19/2017, 07/08/2019   Influenza-Unspecified 05/19/2017   Tdap 05/23/2014   Zoster, Live 05/23/2014    TDAP status: Up to date  Flu Vaccine status: Declined, Education has been provided regarding the importance of this vaccine but patient still declined. Advised may receive this vaccine at local pharmacy or Health Dept. Aware to provide a copy of the vaccination record if obtained from local pharmacy or Health Dept. Verbalized acceptance and understanding.  Pneumococcal vaccine status: Declined,  Education has been provided regarding the importance of this vaccine but patient still declined. Advised may receive this vaccine at local pharmacy or Health Dept. Aware to provide a copy of the vaccination record if obtained from local pharmacy or Health Dept. Verbalized acceptance and understanding.   Covid-19 vaccine status: Information provided on how to obtain vaccines.   Qualifies for Shingles Vaccine? Yes   Zostavax completed No   Shingrix Completed?: No.    Education has been provided regarding the importance of this vaccine. Patient has been advised to call insurance company to determine out of pocket expense if they have not yet received this vaccine. Advised may also receive vaccine at local pharmacy or Health Dept. Verbalized acceptance and understanding.  Screening Tests Health Maintenance  Topic Date Due   Zoster Vaccines- Shingrix (1 of 2) 03/21/1972   DEXA SCAN  08/07/2021   Medicare Annual Wellness (AWV)  12/18/2021   MAMMOGRAM  03/13/2022   INFLUENZA VACCINE  09/14/2023 (Originally 01/15/2023)   DTaP/Tdap/Td (2 - Td or Tdap) 05/23/2024   Colonoscopy  07/24/2028   Hepatitis C Screening  Completed   HPV VACCINES  Aged Out   Pneumonia Vaccine 29+ Years old  Discontinued    COVID-19 Vaccine  Discontinued    Health Maintenance  Health Maintenance Due  Topic Date Due   Zoster Vaccines- Shingrix (1 of 2) 03/21/1972   DEXA SCAN  08/07/2021   Medicare Annual Wellness (AWV)  12/18/2021   MAMMOGRAM  03/13/2022  07/24/2021  Colorectal cancer screening: Type of screening: Colonoscopy. Completed 07/24/2021. Repeat every 7 years  Mammogram status: Ordered 02/13/2023. Pt provided with contact info and advised to call to schedule appt.   Bone Density status: Ordered 01/30/2023. Pt provided with contact info and advised to call to schedule appt.  Lung Cancer Screening: (Low Dose CT Chest recommended if Age 65-80 years, 20 pack-year currently smoking OR have quit w/in 15years.) does not qualify.   Lung Cancer Screening Referral: na  Additional Screening:  Hepatitis C Screening: does not qualify; Completed 08/08/2019  Vision Screening: Recommended annual ophthalmology exams for early detection of glaucoma and other disorders of the eye. Is the patient up to date with their annual eye exam?  Yes  Who is the provider or what is the name of the office in which the patient attends annual eye exams? [facility] [place]  If pt is not established with a provider, would they like to be referred to a provider to establish care? No .   Dental Screening: Recommended annual dental exams for proper oral hygiene  Diabetic Foot Exam: na  Community Resource Referral /  Chronic Care Management: CRR required this visit?  No   CCM required this visit?  No     Plan:     I have personally reviewed and noted the following in the patient's chart:   Medical and social history Use of alcohol, tobacco or illicit drugs  Current medications and supplements including opioid prescriptions. Patient is not currently taking opioid prescriptions. Functional ability and status Nutritional status Physical activity Advanced directives List of other physicians Hospitalizations,  surgeries, and ER visits in previous 12 months Vitals Screenings to include cognitive, depression, and falls Referrals and appointments  In addition, I have reviewed and discussed with patient certain preventive protocols, quality metrics, and best practice recommendations. A written personalized care plan for preventive services as well as general preventive health recommendations were provided to patient.     Jordan Hawks Nahdia Doucet, CMA   02/13/2023   After Visit Summary: (MyChart) Due to this being a telephonic visit, the after visit summary with patients personalized plan was offered to patient via MyChart   Nurse Notes:

## 2023-02-20 DIAGNOSIS — S52515D Nondisplaced fracture of left radial styloid process, subsequent encounter for closed fracture with routine healing: Secondary | ICD-10-CM | POA: Diagnosis not present

## 2023-02-25 ENCOUNTER — Other Ambulatory Visit: Payer: Self-pay | Admitting: Family Medicine

## 2023-02-25 DIAGNOSIS — Z1231 Encounter for screening mammogram for malignant neoplasm of breast: Secondary | ICD-10-CM

## 2023-03-04 ENCOUNTER — Ambulatory Visit (HOSPITAL_COMMUNITY)
Admission: RE | Admit: 2023-03-04 | Discharge: 2023-03-04 | Disposition: A | Discharge status: Home / Self Care | Payer: PPO | Source: Ambulatory Visit | Attending: Family Medicine | Admitting: Family Medicine

## 2023-03-04 DIAGNOSIS — M8589 Other specified disorders of bone density and structure, multiple sites: Secondary | ICD-10-CM | POA: Insufficient documentation

## 2023-03-04 DIAGNOSIS — Z780 Asymptomatic menopausal state: Secondary | ICD-10-CM | POA: Diagnosis not present

## 2023-03-04 DIAGNOSIS — M85851 Other specified disorders of bone density and structure, right thigh: Secondary | ICD-10-CM | POA: Diagnosis not present

## 2023-03-19 ENCOUNTER — Ambulatory Visit
Admission: RE | Admit: 2023-03-19 | Discharge: 2023-03-19 | Disposition: A | Discharge status: Home / Self Care | Payer: PPO | Source: Ambulatory Visit | Attending: Family Medicine | Admitting: Family Medicine

## 2023-03-19 DIAGNOSIS — Z1231 Encounter for screening mammogram for malignant neoplasm of breast: Secondary | ICD-10-CM

## 2023-06-28 ENCOUNTER — Other Ambulatory Visit: Payer: Self-pay | Admitting: Family Medicine

## 2023-08-03 ENCOUNTER — Ambulatory Visit: Payer: PPO | Admitting: Family Medicine

## 2023-08-03 VITALS — BP 142/86 | HR 68 | Temp 97.5°F | Ht 63.75 in

## 2023-08-03 DIAGNOSIS — R7303 Prediabetes: Secondary | ICD-10-CM

## 2023-08-03 DIAGNOSIS — K219 Gastro-esophageal reflux disease without esophagitis: Secondary | ICD-10-CM | POA: Diagnosis not present

## 2023-08-03 DIAGNOSIS — I10 Essential (primary) hypertension: Secondary | ICD-10-CM

## 2023-08-03 DIAGNOSIS — E782 Mixed hyperlipidemia: Secondary | ICD-10-CM

## 2023-08-03 DIAGNOSIS — Z130 Encounter for screening for diseases of the blood and blood-forming organs and certain disorders involving the immune mechanism: Secondary | ICD-10-CM | POA: Diagnosis not present

## 2023-08-03 MED ORDER — PANTOPRAZOLE SODIUM 40 MG PO TBEC: 40.0000 mg | DELAYED_RELEASE_TABLET | Freq: Every day | ORAL | 3 refills | Status: DC | PRN

## 2023-08-03 MED ORDER — LISINOPRIL 20 MG PO TABS: 20.0000 mg | ORAL_TABLET | Freq: Every day | ORAL | 3 refills | Status: DC

## 2023-08-03 MED ORDER — ROSUVASTATIN CALCIUM 40 MG PO TABS: 40.0000 mg | ORAL_TABLET | Freq: Every day | ORAL | 3 refills | Status: DC

## 2023-08-03 MED ORDER — OMEGA-3-ACID ETHYL ESTERS 1 G PO CAPS: 2.0000 | ORAL_CAPSULE | Freq: Two times a day (BID) | ORAL | 3 refills | Status: AC

## 2023-08-03 NOTE — Assessment & Plan Note (Addendum)
Lipid panel to reassess.  Continue Crestor and Lovaza.

## 2023-08-03 NOTE — Patient Instructions (Signed)
Medications refilled.  Labs today.  Follow up in 6 months.

## 2023-08-03 NOTE — Assessment & Plan Note (Signed)
BP mildly elevated here today.  Fair control given age.  Continue current medications.  Labs today.

## 2023-08-03 NOTE — Assessment & Plan Note (Signed)
Stable on Protonix.  Refilled.

## 2023-08-03 NOTE — Progress Notes (Signed)
Subjective:  Patient ID: Diane Watkins, female    DOB: 16-Jul-1950  Age: 73 y.o. MRN: [id number]  CC:   Chief Complaint  Patient presents with   Hypertension    6 month f/u hypertension    HPI:  73 year old female presents for evaluation of the above.  Hypertension fairly well-controlled.  BP 142/86 today.  She is compliant with lisinopril.  Patient states that overall she is feeling well.  She is struggling with back pain.  Following with provider at [facility].  Most recent LDL 95.  Triglycerides were elevated.  Patient compliant with Crestor and Lovaza.  Lipid panel needs to be obtained to reassess.  GERD stable on Protonix.  Patient Active Problem List   Diagnosis Date Noted   Prediabetes 01/30/2022   GERD (gastroesophageal reflux disease) 11/14/2021   Hyperlipidemia 07/09/2019   Primary osteoarthritis of both knees 05/01/2015   Osteopenia 05/17/2014   Essential hypertension, benign 05/04/2013    Social Hx   Social History   Socioeconomic History   Marital status: Married    Spouse name: Not on file   Number of children: Not on file   Years of education: Not on file   Highest education level: 12th grade  Occupational History   Not on file  Tobacco Use   Smoking status: Never    Passive exposure: Past   Smokeless tobacco: Never  Vaping Use   Vaping status: Never Used  Substance and Sexual Activity   Alcohol use: Yes    Comment: rare - social   Drug use: No   Sexual activity: Yes    Birth control/protection: Post-menopausal  Other Topics Concern   Not on file  Social History Narrative   Not on file   Social Drivers of Health   Financial Resource Strain: Low Risk  (08/03/2023)   Overall Financial Resource Strain (CARDIA)    Difficulty of Paying Living Expenses: Not hard at all  Food Insecurity: No Food Insecurity (08/03/2023)   Hunger Vital Sign    Worried About Running Out of Food in the Last Year: Never true    Ran Out of Food in the Last  Year: Never true  Transportation Needs: No Transportation Needs (08/03/2023)   PRAPARE - Administrator, Civil Service (Medical): No    Lack of Transportation (Non-Medical): No  Physical Activity: Insufficiently Active (08/03/2023)   Exercise Vital Sign    Days of Exercise per Week: 1 day    Minutes of Exercise per Session: 10 min  Stress: No Stress Concern Present (08/03/2023)   [employer] of Occupational Health - Occupational Stress Questionnaire    Feeling of Stress : Not at all  Social Connections: Moderately Integrated (08/03/2023)   Social Connection and Isolation Panel [NHANES]    Frequency of Communication with Friends and Family: More than three times a week    Frequency of Social Gatherings with Friends and Family: Three times a week    Attends Religious Services: More than 4 times per year    Active Member of Clubs or Organizations: No    Attends Banker Meetings: Never    Marital Status: Married    Review of Systems  Respiratory: Negative.    Cardiovascular: Negative.    Objective:  BP (!) 142/86   Pulse 68   Temp (!) 97.5 F (36.4 C)   Ht 5' 3.75" (1.619 m)   SpO2 98%   BMI 33.56 kg/m      08/03/2023  11:07 AM 08/03/2023   10:54 AM 02/13/2023   10:01 AM  BP/Weight  Systolic BP 142 144 --  Diastolic BP 86 86 --  Wt. (Lbs)   194  BMI   33.56 kg/m2    Physical Exam Vitals and nursing note reviewed.  Constitutional:      General: She is not in acute distress.    Appearance: Normal appearance.  HENT:     Head: Normocephalic and atraumatic.  Eyes:     General:        Right eye: No discharge.        Left eye: No discharge.     Conjunctiva/sclera: Conjunctivae normal.  Cardiovascular:     Rate and Rhythm: Normal rate and regular rhythm.  Pulmonary:     Effort: Pulmonary effort is normal.     Breath sounds: Normal breath sounds. No wheezing, rhonchi or rales.  Neurological:     Mental Status: She is alert.  Psychiatric:         Mood and Affect: Mood normal.        Behavior: Behavior normal.     Lab Results  Component Value Date   WBC 10.2 01/29/2022   HGB 13.6 01/29/2022   HCT 39.9 01/29/2022   PLT 262 01/29/2022   GLUCOSE 99 08/01/2022   CHOL 186 08/01/2022   TRIG 238 (H) 08/01/2022   HDL 51 08/01/2022   LDLCALC 95 08/01/2022   ALT 22 08/01/2022   AST 28 08/01/2022   NA 139 08/01/2022   K 5.0 08/01/2022   CL 99 08/01/2022   CREATININE 0.98 08/01/2022   BUN 16 08/01/2022   CO2 23 08/01/2022   TSH 2.830 07/08/2019   HGBA1C 6.3 (H) 08/01/2022     Assessment & Plan:   Essential hypertension, benign Assessment & Plan: BP mildly elevated here today.  Fair control given age.  Continue current medications.  Labs today.  Orders: -     Lisinopril; Take 1 tablet (20 mg total) by mouth daily.  Dispense: 90 tablet; Refill: 3 -     CMP14+EGFR -     Microalbumin / creatinine urine ratio  Mixed hyperlipidemia Assessment & Plan: Lipid panel to reassess.  Continue Crestor and Lovaza.  Orders: -     Omega-3-acid Ethyl Esters; Take 2 capsules (2 g total) by mouth 2 (two) times daily.  Dispense: 360 capsule; Refill: 3 -     Rosuvastatin Calcium; Take 1 tablet (40 mg total) by mouth daily.  Dispense: 90 tablet; Refill: 3 -     Lipid panel  Prediabetes -     Hemoglobin A1c -     Microalbumin / creatinine urine ratio  Screening for deficiency anemia -     CBC  Gastroesophageal reflux disease without esophagitis Assessment & Plan: Stable on Protonix.  Refilled.  Orders: -     Pantoprazole Sodium; Take 1 tablet (40 mg total) by mouth daily as needed.  Dispense: 90 tablet; Refill: 3   Follow-up:  6 months  Zanyla Klebba Adriana Simas DO [facility]

## 2023-08-04 ENCOUNTER — Encounter: Payer: Self-pay | Admitting: Family Medicine

## 2023-08-04 LAB — CMP14+EGFR
ALT: 20 [IU]/L (ref 0–32)
AST: 23 [IU]/L (ref 0–40)
Albumin: 4.7 g/dL (ref 3.9–4.9)
Alkaline Phosphatase: 61 [IU]/L (ref 44–121)
BUN/Creatinine Ratio: 12 (ref 12–28)
BUN: 10 mg/dL (ref 8–27)
Bilirubin Total: 0.4 mg/dL (ref 0.0–1.2)
CO2: 23 mmol/L (ref 20–29)
Calcium: 10 mg/dL (ref 8.7–10.3)
Chloride: 103 mmol/L (ref 96–106)
Creatinine, Ser: 0.83 mg/dL (ref 0.57–1.00)
Globulin, Total: 2 g/dL (ref 1.5–4.5)
Glucose: 110 mg/dL — ABNORMAL HIGH (ref 70–99)
Potassium: 5 mmol/L (ref 3.5–5.2)
Sodium: 143 mmol/L (ref 134–144)
Total Protein: 6.7 g/dL (ref 6.0–8.5)
eGFR: 76 mL/min/{1.73_m2} (ref >59)

## 2023-08-04 LAB — CBC
Hematocrit: 42.3 % (ref 34.0–46.6)
Hemoglobin: 14.3 g/dL (ref 11.1–15.9)
MCH: 31.4 pg (ref 26.6–33.0)
MCHC: 33.8 g/dL (ref 31.5–35.7)
MCV: 93 fL (ref 79–97)
Platelets: 270 10*3/uL (ref 150–450)
RBC: 4.55 x10E6/uL (ref 3.77–5.28)
RDW: 13.5 % (ref 11.7–15.4)
WBC: 7.9 10*3/uL (ref 3.4–10.8)

## 2023-08-04 LAB — HEMOGLOBIN A1C
Est. average glucose Bld gHb Est-mCnc: 154 mg/dL
Hgb A1c MFr Bld: 7 % — ABNORMAL HIGH (ref 4.8–5.6)

## 2023-08-04 LAB — LIPID PANEL
Chol/HDL Ratio: 3.7 {ratio} (ref 0.0–4.4)
Cholesterol, Total: 181 mg/dL (ref 100–199)
HDL: 49 mg/dL (ref >39)
LDL Chol Calc (NIH): 94 mg/dL (ref 0–99)
Triglycerides: 223 mg/dL — ABNORMAL HIGH (ref 0–149)
VLDL Cholesterol Cal: 38 mg/dL (ref 5–40)

## 2023-08-04 LAB — MICROALBUMIN / CREATININE URINE RATIO
Creatinine, Urine: 32.8 mg/dL
Microalb/Creat Ratio: 25 mg/g{creat} (ref 0–29)
Microalbumin, Urine: 8.1 ug/mL

## 2023-11-12 ENCOUNTER — Other Ambulatory Visit (HOSPITAL_COMMUNITY): Payer: Self-pay

## 2023-11-12 DIAGNOSIS — M545 Low back pain, unspecified: Secondary | ICD-10-CM

## 2023-11-13 ENCOUNTER — Encounter (HOSPITAL_COMMUNITY): Payer: Self-pay

## 2023-11-13 ENCOUNTER — Other Ambulatory Visit (HOSPITAL_COMMUNITY): Payer: Self-pay

## 2023-11-13 ENCOUNTER — Other Ambulatory Visit: Payer: Self-pay

## 2023-11-13 DIAGNOSIS — M545 Low back pain, unspecified: Secondary | ICD-10-CM

## 2023-11-20 ENCOUNTER — Ambulatory Visit (HOSPITAL_COMMUNITY)
Admission: RE | Admit: 2023-11-20 | Discharge: 2023-11-20 | Disposition: A | Discharge status: Home / Self Care | Source: Ambulatory Visit

## 2023-11-20 DIAGNOSIS — G8929 Other chronic pain: Secondary | ICD-10-CM | POA: Diagnosis not present

## 2023-11-20 DIAGNOSIS — M2548 Effusion, other site: Secondary | ICD-10-CM | POA: Diagnosis not present

## 2023-11-20 DIAGNOSIS — M47816 Spondylosis without myelopathy or radiculopathy, lumbar region: Secondary | ICD-10-CM | POA: Diagnosis not present

## 2023-11-20 DIAGNOSIS — M4316 Spondylolisthesis, lumbar region: Secondary | ICD-10-CM | POA: Diagnosis not present

## 2023-11-20 DIAGNOSIS — M48061 Spinal stenosis, lumbar region without neurogenic claudication: Secondary | ICD-10-CM | POA: Diagnosis not present

## 2023-11-20 DIAGNOSIS — M5117 Intervertebral disc disorders with radiculopathy, lumbosacral region: Secondary | ICD-10-CM | POA: Diagnosis not present

## 2023-11-20 DIAGNOSIS — M545 Low back pain, unspecified: Secondary | ICD-10-CM | POA: Insufficient documentation

## 2023-11-20 DIAGNOSIS — M5116 Intervertebral disc disorders with radiculopathy, lumbar region: Secondary | ICD-10-CM | POA: Diagnosis not present

## 2023-11-23 DIAGNOSIS — M48061 Spinal stenosis, lumbar region without neurogenic claudication: Secondary | ICD-10-CM | POA: Diagnosis not present

## 2023-11-23 DIAGNOSIS — M5416 Radiculopathy, lumbar region: Secondary | ICD-10-CM | POA: Diagnosis not present

## 2023-11-26 DIAGNOSIS — M5416 Radiculopathy, lumbar region: Secondary | ICD-10-CM | POA: Diagnosis not present

## 2023-12-09 DIAGNOSIS — M48061 Spinal stenosis, lumbar region without neurogenic claudication: Secondary | ICD-10-CM | POA: Diagnosis not present

## 2023-12-09 DIAGNOSIS — M5416 Radiculopathy, lumbar region: Secondary | ICD-10-CM | POA: Diagnosis not present

## 2023-12-28 ENCOUNTER — Ambulatory Visit: Admitting: Family Medicine

## 2023-12-28 ENCOUNTER — Encounter: Payer: Self-pay | Admitting: Family Medicine

## 2023-12-28 VITALS — BP 139/85 | HR 93 | Temp 97.3°F | Ht 63.75 in | Wt 204.0 lb

## 2023-12-28 DIAGNOSIS — Z01818 Encounter for other preprocedural examination: Secondary | ICD-10-CM | POA: Insufficient documentation

## 2023-12-28 DIAGNOSIS — E119 Type 2 diabetes mellitus without complications: Secondary | ICD-10-CM | POA: Diagnosis not present

## 2023-12-28 NOTE — Progress Notes (Signed)
 Subjective:  Patient ID: Diane Watkins, female    DOB: [birth year]  Age: 73 y.o. MRN: [id number]  CC:   Chief Complaint  Patient presents with   surgical clearance    Low back with murphy weiner ruthellen    HPI:  73 year old female with hypertension, GERD, osteopenia, osteoarthritis, type 2 diabetes (last A1c was 7.0), hyperlipidemia presents for a preoperative exam.  Patient has been evaluated by Beverley Economy.  Recent imaging of the lumbar spine revealed advanced lumbar disc, endplate, and facet degeneration.  Surgery has been recommended and she is in need of preoperative clearance.  Patient denies chest pain or shortness of breath.  BP fairly well-controlled on lisinopril .  No underlying lung disease.  She is not a smoker.  Last A1c 7.0.  Patient Active Problem List   Diagnosis Date Noted   Type 2 diabetes mellitus without complications (HCC) 12/28/2023   Preop examination 12/28/2023   GERD (gastroesophageal reflux disease) 11/14/2021   Hyperlipidemia 07/09/2019   Primary osteoarthritis of both knees 05/01/2015   Osteopenia 05/17/2014   Essential hypertension, benign 05/04/2013    Social Hx   Social History   Socioeconomic History   Marital status: Married    Spouse name: Not on file   Number of children: Not on file   Years of education: Not on file   Highest education level: 12th grade  Occupational History   Not on file  Tobacco Use   Smoking status: Never    Passive exposure: Past   Smokeless tobacco: Never  Vaping Use   Vaping status: Never Used  Substance and Sexual Activity   Alcohol  use: Yes    Comment: rare - social   Drug use: No   Sexual activity: Yes    Birth control/protection: Post-menopausal  Other Topics Concern   Not on file  Social History Narrative   Not on file   Social Drivers of Health   Financial Resource Strain: Low Risk  (08/03/2023)   Overall Financial Resource Strain (CARDIA)    Difficulty of Paying Living Expenses: Not  hard at all  Food Insecurity: No Food Insecurity (08/03/2023)   Hunger Vital Sign    Worried About Running Out of Food in the Last Year: Never true    Ran Out of Food in the Last Year: Never true  Transportation Needs: No Transportation Needs (08/03/2023)   PRAPARE - Administrator, Civil Service (Medical): No    Lack of Transportation (Non-Medical): No  Physical Activity: Insufficiently Active (08/03/2023)   Exercise Vital Sign    Days of Exercise per Week: 1 day    Minutes of Exercise per Session: 10 min  Stress: No Stress Concern Present (08/03/2023)   [employer] of Occupational Health - Occupational Stress Questionnaire    Feeling of Stress : Not at all  Social Connections: Moderately Integrated (08/03/2023)   Social Connection and Isolation Panel    Frequency of Communication with Friends and Family: More than three times a week    Frequency of Social Gatherings with Friends and Family: Three times a week    Attends Religious Services: More than 4 times per year    Active Member of Clubs or Organizations: No    Attends Banker Meetings: Never    Marital Status: Married    Review of Systems Per HPI  Objective:  BP 139/85   Pulse 93   Temp (!) 97.3 F (36.3 C)   Ht 5' 3.75 (1.619 m)  Wt 204 lb (92.5 kg)   SpO2 97%   BMI 35.29 kg/m      12/28/2023   10:11 AM 12/28/2023    9:59 AM 08/03/2023   11:07 AM  BP/Weight  Systolic BP 139 148 142  Diastolic BP 85 98 86  Wt. (Lbs)  204   BMI  35.29 kg/m2     Physical Exam Vitals and nursing note reviewed.  Constitutional:      General: She is not in acute distress.    Appearance: Normal appearance. She is obese.  Eyes:     General:        Right eye: No discharge.        Left eye: No discharge.     Conjunctiva/sclera: Conjunctivae normal.  Cardiovascular:     Rate and Rhythm: Normal rate and regular rhythm.  Pulmonary:     Effort: Pulmonary effort is normal.     Breath sounds: Normal  breath sounds. No wheezing, rhonchi or rales.  Neurological:     Mental Status: She is alert.  Psychiatric:        Mood and Affect: Mood normal.        Behavior: Behavior normal.     Lab Results  Component Value Date   WBC 7.9 08/03/2023   HGB 14.3 08/03/2023   HCT 42.3 08/03/2023   PLT 270 08/03/2023   GLUCOSE 110 (H) 08/03/2023   CHOL 181 08/03/2023   TRIG 223 (H) 08/03/2023   HDL 49 08/03/2023   LDLCALC 94 08/03/2023   ALT 20 08/03/2023   AST 23 08/03/2023   NA 143 08/03/2023   K 5.0 08/03/2023   CL 103 08/03/2023   CREATININE 0.83 08/03/2023   BUN 10 08/03/2023   CO2 23 08/03/2023   TSH 2.830 07/08/2019   HGBA1C 7.0 (H) 08/03/2023     Assessment & Plan:  Preop examination Assessment & Plan: Overall low risk.  Form filled out and will be faxed to surgeon.   Type 2 diabetes mellitus without complication, without long-term current use of insulin (HCC) Assessment & Plan: Last A1c was 7.  Needs updated A1c.  Orders: -     Hemoglobin A1c    Follow-up:  3-6 months.  Jacqulyn Ahle DO [facility]

## 2023-12-28 NOTE — Patient Instructions (Signed)
 Best of luck with surgery.  Take care  Dr. Bluford

## 2023-12-28 NOTE — Assessment & Plan Note (Signed)
 Overall low risk.  Form filled out and will be faxed to surgeon.

## 2023-12-28 NOTE — Assessment & Plan Note (Signed)
 Last A1c was 7.  Needs updated A1c.

## 2024-01-05 DIAGNOSIS — M48061 Spinal stenosis, lumbar region without neurogenic claudication: Secondary | ICD-10-CM | POA: Diagnosis not present

## 2024-01-05 DIAGNOSIS — M5416 Radiculopathy, lumbar region: Secondary | ICD-10-CM | POA: Diagnosis not present

## 2024-01-28 DIAGNOSIS — M5416 Radiculopathy, lumbar region: Secondary | ICD-10-CM | POA: Diagnosis not present

## 2024-02-02 NOTE — Pre-Procedure Instructions (Signed)
 Surgical Instructions   Your procedure is scheduled on February 08, 2024. Report to [facility] Main Entrance A at 10:00 A.M., then check in with the Admitting office. Any questions or running late day of surgery: call [phone]  Questions prior to your surgery date: call [phone], Monday-Friday, 8am-4pm. If you experience any cold or flu symptoms such as cough, fever, chills, shortness of breath, etc. between now and your scheduled surgery, please notify us  at the above number.     Remember:  Do not eat after midnight the night before your surgery  You may drink clear liquids until 9:00 AM the morning of your surgery.   Clear liquids allowed are: Water, Non-Citrus Juices (without pulp), Carbonated Beverages, Clear Tea (no milk, honey, etc.), Black Coffee Only (NO MILK, CREAM OR POWDERED CREAMER of any kind), and Gatorade.    Take these medicines the morning of surgery with A SIP OF WATER: NONE   May take these medicines IF NEEDED: acetaminophen  (TYLENOL )  cetirizine (ZYRTEC)  Polyethyl Glycol-Propyl Glycol eye drops   Please contact your surgeon regarding if/when to stop taking your omega-3 acid ethyl esters (LOVAZA ).   One week prior to surgery, STOP taking any Aspirin  (unless otherwise instructed by your surgeon) Aleve , Naproxen , Ibuprofen, Motrin, Advil, Goody's, BC's, all herbal medications, fish oil, and non-prescription vitamins. This includes your medication, Trolamine Salicylate (ARTHRITIS PAIN MEDICINE EX)    HOW TO MANAGE YOUR DIABETES BEFORE AND AFTER SURGERY  Why is it important to control my blood sugar before and after surgery? Improving blood sugar levels before and after surgery helps healing and can limit problems. A way of improving blood sugar control is eating a healthy diet by:  Eating less sugar and carbohydrates  Increasing activity/exercise  Talking with your doctor about reaching your blood sugar goals High blood sugars (greater than 180  mg/dL) can raise your risk of infections and slow your recovery, so you will need to focus on controlling your diabetes during the weeks before surgery. Make sure that the doctor who takes care of your diabetes knows about your planned surgery including the date and location.  How do I manage my blood sugar before surgery? Check your blood sugar at least 4 times a day, starting 2 days before surgery, to make sure that the level is not too high or low.  Check your blood sugar the morning of your surgery when you wake up and every 2 hours until you get to the Short Stay unit.  If your blood sugar is less than 70 mg/dL, you will need to treat for low blood sugar: Do not take insulin. Treat a low blood sugar (less than 70 mg/dL) with  cup of clear juice (cranberry or apple), 4 glucose tablets, OR glucose gel. Recheck blood sugar in 15 minutes after treatment (to make sure it is greater than 70 mg/dL). If your blood sugar is not greater than 70 mg/dL on recheck, call [phone] for further instructions. Report your blood sugar to the short stay nurse when you get to Short Stay.  If you are admitted to the hospital after surgery: Your blood sugar will be checked by the staff and you will probably be given insulin after surgery (instead of oral diabetes medicines) to make sure you have good blood sugar levels. The goal for blood sugar control after surgery is 80-180 mg/dL.                      Do NOT Smoke (  Tobacco/Vaping) for 24 hours prior to your procedure.  If you use a CPAP at night, you may bring your mask/headgear for your overnight stay.   You will be asked to remove any contacts, glasses, piercing's, hearing aid's, dentures/partials prior to surgery. Please bring cases for these items if needed.    Patients discharged the day of surgery will not be allowed to drive home, and someone needs to stay with them for 24 hours.  SURGICAL WAITING ROOM VISITATION Patients may have no more  than 2 support people in the waiting area - these visitors may rotate.   Pre-op nurse will coordinate an appropriate time for 1 ADULT support person, who may not rotate, to accompany patient in pre-op.  Children under the age of 13 must have an adult with them who is not the patient and must remain in the main waiting area with an adult.  If the patient needs to stay at the hospital during part of their recovery, the visitor guidelines for inpatient rooms apply.  Please refer to the [facility] website for the visitor guidelines for any additional information.   If you received a COVID test during your pre-op visit  it is requested that you wear a mask when out in public, stay away from anyone that may not be feeling well and notify your surgeon if you develop symptoms. If you have been in contact with anyone that has tested positive in the last 10 days please notify you surgeon.      Pre-operative 5 CHG Bathing Instructions   You can play a key role in reducing the risk of infection after surgery. Your skin needs to be as free of germs as possible. You can reduce the number of germs on your skin by washing with CHG (chlorhexidine  gluconate) soap before surgery. CHG is an antiseptic soap that kills germs and continues to kill germs even after washing.   DO NOT use if you have an allergy to chlorhexidine /CHG or antibacterial soaps. If your skin becomes reddened or irritated, stop using the CHG and notify one of our RNs at [phone].   Please shower with the CHG soap starting 4 days before surgery using the following schedule:     Please keep in mind the following:  DO NOT shave, including legs and underarms, starting the day of your first shower.   You may shave your face at any point before/day of surgery.  Place clean sheets on your bed the day you start using CHG soap. Use a clean washcloth (not used since being washed) for each shower. DO NOT sleep with pets once you start using  the CHG.   CHG Shower Instructions:  Wash your face and private area with normal soap. If you choose to wash your hair, wash first with your normal shampoo.  After you use shampoo/soap, rinse your hair and body thoroughly to remove shampoo/soap residue.  Turn the water OFF and apply about 3 tablespoons (45 ml) of CHG soap to a CLEAN washcloth.  Apply CHG soap ONLY FROM YOUR NECK DOWN TO YOUR TOES (washing for 3-5 minutes)  DO NOT use CHG soap on face, private areas, open wounds, or sores.  Pay special attention to the area where your surgery is being performed.  If you are having back surgery, having someone wash your back for you may be helpful. Wait 2 minutes after CHG soap is applied, then you may rinse off the CHG soap.  Pat dry with a clean towel  Put on clean clothes/pajamas   If you choose to wear lotion, please use ONLY the CHG-compatible lotions that are listed below.  Additional instructions for the day of surgery: DO NOT APPLY any lotions, deodorants, cologne, or perfumes.   Do not bring valuables to the hospital. [facility] is not responsible for any belongings/valuables. Do not wear nail polish, gel polish, artificial nails, or any other type of covering on natural nails (fingers and toes) Do not wear jewelry or makeup Put on clean/comfortable clothes.  Please brush your teeth.  Ask your nurse before applying any prescription medications to the skin.     CHG Compatible Lotions   Aveeno Moisturizing lotion  Cetaphil Moisturizing Cream  Cetaphil Moisturizing Lotion  Clairol Herbal Essence Moisturizing Lotion, Dry Skin  Clairol Herbal Essence Moisturizing Lotion, Extra Dry Skin  Clairol Herbal Essence Moisturizing Lotion, Normal Skin  Curel Age Defying Therapeutic Moisturizing Lotion with Alpha Hydroxy  Curel Extreme Care Body Lotion  Curel Soothing Hands Moisturizing Hand Lotion  Curel Therapeutic Moisturizing Cream, Fragrance-Free  Curel Therapeutic Moisturizing  Lotion, Fragrance-Free  Curel Therapeutic Moisturizing Lotion, Original Formula  Eucerin Daily Replenishing Lotion  Eucerin Dry Skin Therapy Plus Alpha Hydroxy Crme  Eucerin Dry Skin Therapy Plus Alpha Hydroxy Lotion  Eucerin Original Crme  Eucerin Original Lotion  Eucerin Plus Crme Eucerin Plus Lotion  Eucerin TriLipid Replenishing Lotion  Keri Anti-Bacterial Hand Lotion  Keri Deep Conditioning Original Lotion Dry Skin Formula Softly Scented  Keri Deep Conditioning Original Lotion, Fragrance Free Sensitive Skin Formula  Keri Lotion Fast Absorbing Fragrance Free Sensitive Skin Formula  Keri Lotion Fast Absorbing Softly Scented Dry Skin Formula  Keri Original Lotion  Keri Skin Renewal Lotion Keri Silky Smooth Lotion  Keri Silky Smooth Sensitive Skin Lotion  Nivea Body Creamy Conditioning Oil  Nivea Body Extra Enriched Lotion  Nivea Body Original Lotion  Nivea Body Sheer Moisturizing Lotion Nivea Crme  Nivea Skin Firming Lotion  NutraDerm 30 Skin Lotion  NutraDerm Skin Lotion  NutraDerm Therapeutic Skin Cream  NutraDerm Therapeutic Skin Lotion  ProShield Protective Hand Cream  Provon moisturizing lotion  Please read over the following fact sheets that you were given.

## 2024-02-03 ENCOUNTER — Encounter (HOSPITAL_COMMUNITY)
Admission: RE | Admit: 2024-02-03 | Discharge: 2024-02-03 | Disposition: A | Discharge status: Home / Self Care | Source: Ambulatory Visit

## 2024-02-03 ENCOUNTER — Encounter (HOSPITAL_COMMUNITY): Payer: Self-pay

## 2024-02-03 ENCOUNTER — Other Ambulatory Visit: Payer: Self-pay

## 2024-02-03 VITALS — BP 135/88 | HR 97 | Temp 98.4°F | Resp 17 | Ht 62.0 in | Wt 205.0 lb

## 2024-02-03 DIAGNOSIS — Z01810 Encounter for preprocedural cardiovascular examination: Secondary | ICD-10-CM | POA: Diagnosis present

## 2024-02-03 DIAGNOSIS — Z01812 Encounter for preprocedural laboratory examination: Secondary | ICD-10-CM | POA: Diagnosis present

## 2024-02-03 DIAGNOSIS — I452 Bifascicular block: Secondary | ICD-10-CM | POA: Insufficient documentation

## 2024-02-03 DIAGNOSIS — E119 Type 2 diabetes mellitus without complications: Secondary | ICD-10-CM | POA: Insufficient documentation

## 2024-02-03 DIAGNOSIS — Z01818 Encounter for other preprocedural examination: Secondary | ICD-10-CM | POA: Insufficient documentation

## 2024-02-03 HISTORY — DX: Type 2 diabetes mellitus without complications: E11.9

## 2024-02-03 HISTORY — DX: Personal history of other diseases of the digestive system: Z87.19

## 2024-02-03 LAB — CBC
HCT: 40.2 % (ref 36.0–46.0)
Hemoglobin: 13.6 g/dL (ref 12.0–15.0)
MCH: 31.8 pg (ref 26.0–34.0)
MCHC: 33.8 g/dL (ref 30.0–36.0)
MCV: 93.9 fL (ref 80.0–100.0)
Platelets: 302 K/uL (ref 150–400)
RBC: 4.28 MIL/uL (ref 3.87–5.11)
RDW: 13.7 % (ref 11.5–15.5)
WBC: 8.5 K/uL (ref 4.0–10.5)
nRBC: 0 % (ref 0.0–0.2)

## 2024-02-03 LAB — HEMOGLOBIN A1C
Hgb A1c MFr Bld: 7 % — ABNORMAL HIGH (ref 4.8–5.6)
Mean Plasma Glucose: 154.2 mg/dL

## 2024-02-03 LAB — BASIC METABOLIC PANEL WITH GFR
Anion gap: 10 (ref 5–15)
BUN: 15 mg/dL (ref 8–23)
CO2: 28 mmol/L (ref 22–32)
Calcium: 9.8 mg/dL (ref 8.9–10.3)
Chloride: 101 mmol/L (ref 98–111)
Creatinine, Ser: 0.87 mg/dL (ref 0.44–1.00)
GFR, Estimated: >60 mL/min (ref >60)
Glucose, Bld: 92 mg/dL (ref 70–99)
Potassium: 4.1 mmol/L (ref 3.5–5.1)
Sodium: 139 mmol/L (ref 135–145)

## 2024-02-03 LAB — TYPE AND SCREEN
ABO/RH(D): A POS
Antibody Screen: NEGATIVE

## 2024-02-03 LAB — SURGICAL PCR SCREEN
MRSA, PCR: NEGATIVE
Staphylococcus aureus: NEGATIVE

## 2024-02-03 NOTE — H&P (View-Only) (Signed)
 PCP - Jayce G. Bluford, DO Cardiologist - Denies  PPM/ICD - Denies Device Orders - n/a Rep Notified - n/a  Chest x-ray - n/a EKG - 02/03/2024 Stress Test - Denies ECHO - Denies Cardiac Cath - Denies  Sleep Study - Denies CPAP - n/a  Pt states that she does not have DM, but last A1c checked in February 2025 result was 7.0. Pt does not have glucose meter at home. A1c result today is pending. RN educated pt on what A1c result meant and that she needs to follow-up with PCP in regards to further instructions. She was instructed to minimize carb/sugar intake leading up to surgery to optimize glucose values morning of surgery. Pt understood instructions  Last dose of GLP1 agonist-  n/a GLP1 instructions: n/a  Blood Thinner Instructions: n/a Aspirin  Instructions: n/a  ERAS Protcol - Clear liquids until 0930 morning of surgery PRE-SURGERY Ensure or G2- n/a  COVID TEST- n/a   Anesthesia review: Yes. EKG review and surgical clearance in PCP note. Pt was instructed to hold her Lovaza  prior to surgery. Her last dose was 8/17.  Patient denies shortness of breath, fever, cough and chest pain at PAT appointment. Pt denies any respiratory illness/infection in the last two months.    All instructions explained to the patient, with a verbal understanding of the material. Patient agrees to go over the instructions while at home for a better understanding. Patient also instructed to self quarantine after being tested for COVID-19. The opportunity to ask questions was provided.

## 2024-02-03 NOTE — Progress Notes (Signed)
 PCP - Jayce G. Bluford, DO Cardiologist - Denies  PPM/ICD - Denies Device Orders - n/a Rep Notified - n/a  Chest x-ray - n/a EKG - 02/03/2024 Stress Test - Denies ECHO - Denies Cardiac Cath - Denies  Sleep Study - Denies CPAP - n/a  Pt states that she does not have DM, but last A1c checked in February 2025 result was 7.0. Pt does not have glucose meter at home. A1c result today is pending. RN educated pt on what A1c result meant and that she needs to follow-up with PCP in regards to further instructions. She was instructed to minimize carb/sugar intake leading up to surgery to optimize glucose values morning of surgery. Pt understood instructions  Last dose of GLP1 agonist-  n/a GLP1 instructions: n/a  Blood Thinner Instructions: n/a Aspirin  Instructions: n/a  ERAS Protcol - Clear liquids until 0930 morning of surgery PRE-SURGERY Ensure or G2- n/a  COVID TEST- n/a   Anesthesia review: Yes. EKG review and surgical clearance in PCP note. Pt was instructed to hold her Lovaza  prior to surgery. Her last dose was 8/17.  Patient denies shortness of breath, fever, cough and chest pain at PAT appointment. Pt denies any respiratory illness/infection in the last two months.    All instructions explained to the patient, with a verbal understanding of the material. Patient agrees to go over the instructions while at home for a better understanding. Patient also instructed to self quarantine after being tested for COVID-19. The opportunity to ask questions was provided.

## 2024-02-08 ENCOUNTER — Ambulatory Visit (HOSPITAL_COMMUNITY)

## 2024-02-08 ENCOUNTER — Other Ambulatory Visit: Payer: Self-pay

## 2024-02-08 ENCOUNTER — Ambulatory Visit (HOSPITAL_COMMUNITY): Payer: Self-pay | Admitting: Physician Assistant

## 2024-02-08 ENCOUNTER — Inpatient Hospital Stay (HOSPITAL_COMMUNITY)
Admission: RE | Admit: 2024-02-08 | Discharge: 2024-02-10 | DRG: 402 | Disposition: A | Discharge status: Home / Self Care

## 2024-02-08 ENCOUNTER — Encounter (HOSPITAL_COMMUNITY): Payer: Self-pay

## 2024-02-08 ENCOUNTER — Encounter (HOSPITAL_COMMUNITY): Admission: RE | Disposition: A | Discharge status: Home / Self Care | Payer: Self-pay | Source: Home / Self Care

## 2024-02-08 DIAGNOSIS — M4316 Spondylolisthesis, lumbar region: Secondary | ICD-10-CM | POA: Diagnosis present

## 2024-02-08 DIAGNOSIS — M48061 Spinal stenosis, lumbar region without neurogenic claudication: Secondary | ICD-10-CM | POA: Diagnosis not present

## 2024-02-08 DIAGNOSIS — E7800 Pure hypercholesterolemia, unspecified: Secondary | ICD-10-CM | POA: Diagnosis present

## 2024-02-08 DIAGNOSIS — E119 Type 2 diabetes mellitus without complications: Secondary | ICD-10-CM | POA: Diagnosis present

## 2024-02-08 DIAGNOSIS — M5416 Radiculopathy, lumbar region: Secondary | ICD-10-CM | POA: Diagnosis present

## 2024-02-08 DIAGNOSIS — K219 Gastro-esophageal reflux disease without esophagitis: Secondary | ICD-10-CM | POA: Diagnosis present

## 2024-02-08 DIAGNOSIS — M48062 Spinal stenosis, lumbar region with neurogenic claudication: Secondary | ICD-10-CM | POA: Diagnosis present

## 2024-02-08 DIAGNOSIS — I10 Essential (primary) hypertension: Secondary | ICD-10-CM | POA: Diagnosis present

## 2024-02-08 DIAGNOSIS — Z981 Arthrodesis status: Secondary | ICD-10-CM | POA: Diagnosis not present

## 2024-02-08 DIAGNOSIS — Z01818 Encounter for other preprocedural examination: Principal | ICD-10-CM

## 2024-02-08 DIAGNOSIS — E669 Obesity, unspecified: Secondary | ICD-10-CM | POA: Diagnosis present

## 2024-02-08 DIAGNOSIS — Z6837 Body mass index (BMI) 37.0-37.9, adult: Secondary | ICD-10-CM

## 2024-02-08 DIAGNOSIS — G9600 Cerebrospinal fluid leak, unspecified: Secondary | ICD-10-CM | POA: Diagnosis not present

## 2024-02-08 DIAGNOSIS — M5106 Intervertebral disc disorders with myelopathy, lumbar region: Secondary | ICD-10-CM | POA: Diagnosis not present

## 2024-02-08 DIAGNOSIS — M4716 Other spondylosis with myelopathy, lumbar region: Secondary | ICD-10-CM | POA: Diagnosis not present

## 2024-02-08 HISTORY — PX: TRANSFORAMINAL LUMBAR INTERBODY FUSION (TLIF) WITH PEDICLE SCREW FIXATION 1 LEVEL: SHX6141

## 2024-02-08 LAB — GLUCOSE, CAPILLARY
Glucose-Capillary: 122 mg/dL — ABNORMAL HIGH (ref 70–99)
Glucose-Capillary: 122 mg/dL — ABNORMAL HIGH (ref 70–99)
Glucose-Capillary: 93 mg/dL (ref 70–99)
Glucose-Capillary: 116 mg/dL — ABNORMAL HIGH (ref 70–99)

## 2024-02-08 SURGERY — TRANSFORAMINAL LUMBAR INTERBODY FUSION (TLIF) WITH PEDICLE SCREW FIXATION 1 LEVEL
Anesthesia: General | Site: Spine Lumbar

## 2024-02-08 MED ORDER — THROMBIN 5000 UNITS EX KIT
PACK | CUTANEOUS | Status: AC
  Filled 2024-02-08: qty 1

## 2024-02-08 MED ORDER — REMIFENTANIL HCL 2 MG IV SOLR
INTRAVENOUS | Status: AC
  Filled 2024-02-08: qty 2000

## 2024-02-08 MED ORDER — EPHEDRINE SULFATE (PRESSORS) 50 MG/ML IJ SOLN
INTRAMUSCULAR | Status: DC | PRN
  Administered 2024-02-08: 10 mg via INTRAVENOUS

## 2024-02-08 MED ORDER — ALBUMIN HUMAN 5 % IV SOLN: INTRAVENOUS | Status: DC | PRN

## 2024-02-08 MED ORDER — LACTATED RINGERS IV SOLN: INTRAVENOUS | Status: DC | PRN

## 2024-02-08 MED ORDER — OXYCODONE HCL 5 MG PO TABS
5.0000 mg | ORAL_TABLET | ORAL | Status: DC | PRN
  Administered 2024-02-08 (×2): 5 mg via ORAL
  Filled 2024-02-08 (×2): qty 1

## 2024-02-08 MED ORDER — TRANEXAMIC ACID-NACL 1000-0.7 MG/100ML-% IV SOLN
1000.0000 mg | INTRAVENOUS | Status: DC
Start: 2024-02-08 — End: 2024-02-08

## 2024-02-08 MED ORDER — DEXAMETHASONE SODIUM PHOSPHATE 10 MG/ML IJ SOLN
INTRAMUSCULAR | Status: AC
  Filled 2024-02-08: qty 1

## 2024-02-08 MED ORDER — LACTATED RINGERS IV SOLN: INTRAVENOUS | Status: DC

## 2024-02-08 MED ORDER — HYDROMORPHONE HCL 1 MG/ML IJ SOLN
0.2500 mg | INTRAMUSCULAR | Status: DC | PRN
  Administered 2024-02-08 (×4): 0.5 mg via INTRAVENOUS

## 2024-02-08 MED ORDER — SODIUM CHLORIDE 0.9 % IV SOLN: INTRAVENOUS | Status: DC | PRN

## 2024-02-08 MED ORDER — ONDANSETRON HCL 4 MG/2ML IJ SOLN: 4.0000 mg | Freq: Once | INTRAMUSCULAR | Status: DC | PRN

## 2024-02-08 MED ORDER — HYDROMORPHONE HCL 1 MG/ML IJ SOLN
INTRAMUSCULAR | Status: AC
  Filled 2024-02-08: qty 1

## 2024-02-08 MED ORDER — ONDANSETRON HCL 4 MG/2ML IJ SOLN
INTRAMUSCULAR | Status: DC | PRN
  Administered 2024-02-08: 4 mg via INTRAVENOUS

## 2024-02-08 MED ORDER — PROPOFOL 10 MG/ML IV BOLUS
INTRAVENOUS | Status: AC
  Filled 2024-02-08: qty 20

## 2024-02-08 MED ORDER — SODIUM CHLORIDE 0.9 % IV SOLN: INTRAVENOUS | Status: AC

## 2024-02-08 MED ORDER — LIDOCAINE 2% (20 MG/ML) 5 ML SYRINGE
INTRAMUSCULAR | Status: AC
  Filled 2024-02-08: qty 5

## 2024-02-08 MED ORDER — ONDANSETRON HCL 4 MG/2ML IJ SOLN
INTRAMUSCULAR | Status: AC
  Filled 2024-02-08: qty 2

## 2024-02-08 MED ORDER — OXYCODONE HCL 5 MG PO TABS
10.0000 mg | ORAL_TABLET | ORAL | Status: DC | PRN
  Administered 2024-02-09 (×2): 10 mg via ORAL
  Filled 2024-02-08 (×2): qty 2

## 2024-02-08 MED ORDER — FENTANYL CITRATE (PF) 250 MCG/5ML IJ SOLN
INTRAMUSCULAR | Status: DC | PRN
  Administered 2024-02-08: 100 ug via INTRAVENOUS
  Administered 2024-02-08 (×3): 50 ug via INTRAVENOUS

## 2024-02-08 MED ORDER — ONDANSETRON HCL 4 MG/2ML IJ SOLN: 4.0000 mg | Freq: Four times a day (QID) | INTRAMUSCULAR | Status: DC | PRN

## 2024-02-08 MED ORDER — LIDOCAINE 2% (20 MG/ML) 5 ML SYRINGE
INTRAMUSCULAR | Status: DC | PRN
  Administered 2024-02-08: 100 mg via INTRAVENOUS

## 2024-02-08 MED ORDER — BUPIVACAINE-EPINEPHRINE (PF) 0.25% -1:200000 IJ SOLN
INTRAMUSCULAR | Status: AC
  Filled 2024-02-08: qty 30

## 2024-02-08 MED ORDER — OMEGA-3-ACID ETHYL ESTERS 1 G PO CAPS
2.0000 | ORAL_CAPSULE | Freq: Two times a day (BID) | ORAL | Status: DC
  Administered 2024-02-08 – 2024-02-10 (×4): 2 g via ORAL
  Filled 2024-02-08 (×4): qty 2

## 2024-02-08 MED ORDER — MENTHOL 3 MG MT LOZG: 1.0000 | LOZENGE | OROMUCOSAL | Status: DC | PRN

## 2024-02-08 MED ORDER — SUCCINYLCHOLINE CHLORIDE 200 MG/10ML IV SOSY
PREFILLED_SYRINGE | INTRAVENOUS | Status: DC | PRN
  Administered 2024-02-08: 200 mg via INTRAVENOUS

## 2024-02-08 MED ORDER — PHENOL 1.4 % MT LIQD: 1.0000 | OROMUCOSAL | Status: DC | PRN

## 2024-02-08 MED ORDER — FENTANYL CITRATE (PF) 250 MCG/5ML IJ SOLN
INTRAMUSCULAR | Status: AC
  Filled 2024-02-08: qty 5

## 2024-02-08 MED ORDER — TRANEXAMIC ACID-NACL 1000-0.7 MG/100ML-% IV SOLN
INTRAVENOUS | Status: DC | PRN
Start: 2024-02-08 — End: 2024-02-08
  Administered 2024-02-08: 1000 mg via INTRAVENOUS

## 2024-02-08 MED ORDER — LISINOPRIL 20 MG PO TABS
20.0000 mg | ORAL_TABLET | Freq: Every day | ORAL | Status: DC
  Administered 2024-02-08 – 2024-02-10 (×3): 20 mg via ORAL
  Filled 2024-02-08 (×3): qty 1

## 2024-02-08 MED ORDER — BUPIVACAINE-EPINEPHRINE (PF) 0.25% -1:200000 IJ SOLN
INTRAMUSCULAR | Status: DC | PRN
  Administered 2024-02-08: 30 mL

## 2024-02-08 MED ORDER — DEXAMETHASONE SODIUM PHOSPHATE 10 MG/ML IJ SOLN
INTRAMUSCULAR | Status: DC | PRN
  Administered 2024-02-08: 10 mg via INTRAVENOUS

## 2024-02-08 MED ORDER — BISACODYL 10 MG RE SUPP: 10.0000 mg | Freq: Every day | RECTAL | Status: DC | PRN

## 2024-02-08 MED ORDER — HYDROMORPHONE HCL 1 MG/ML IJ SOLN
0.5000 mg | INTRAMUSCULAR | Status: DC | PRN
  Administered 2024-02-08 – 2024-02-09 (×2): 0.5 mg via INTRAVENOUS
  Filled 2024-02-08 (×2): qty 0.5

## 2024-02-08 MED ORDER — ACETAMINOPHEN 325 MG PO TABS: 650.0000 mg | ORAL_TABLET | ORAL | Status: DC | PRN

## 2024-02-08 MED ORDER — CHLORHEXIDINE GLUCONATE 0.12 % MT SOLN
OROMUCOSAL | Status: AC
  Filled 2024-02-08: qty 15

## 2024-02-08 MED ORDER — VANCOMYCIN HCL 1000 MG IV SOLR
INTRAVENOUS | Status: DC | PRN
  Administered 2024-02-08: 1000 mg

## 2024-02-08 MED ORDER — SODIUM CHLORIDE 0.9% FLUSH
3.0000 mL | Freq: Two times a day (BID) | INTRAVENOUS | Status: DC
  Administered 2024-02-08 – 2024-02-09 (×2): 3 mL via INTRAVENOUS

## 2024-02-08 MED ORDER — MAGNESIUM CITRATE PO SOLN: 1.0000 | Freq: Once | ORAL | Status: DC | PRN

## 2024-02-08 MED ORDER — SENNOSIDES-DOCUSATE SODIUM 8.6-50 MG PO TABS: 1.0000 | ORAL_TABLET | Freq: Every evening | ORAL | Status: DC | PRN

## 2024-02-08 MED ORDER — ROCURONIUM BROMIDE 10 MG/ML (PF) SYRINGE
PREFILLED_SYRINGE | INTRAVENOUS | Status: AC
  Filled 2024-02-08: qty 10

## 2024-02-08 MED ORDER — ONDANSETRON HCL 4 MG PO TABS: 4.0000 mg | ORAL_TABLET | Freq: Four times a day (QID) | ORAL | Status: DC | PRN

## 2024-02-08 MED ORDER — REMIFENTANIL HCL 2 MG IV SOLR
INTRAVENOUS | Status: DC | PRN
  Administered 2024-02-08: .08 ug/kg/min via INTRAVENOUS

## 2024-02-08 MED ORDER — OXYCODONE HCL 5 MG PO TABS
5.0000 mg | ORAL_TABLET | Freq: Once | ORAL | Status: AC | PRN
  Administered 2024-02-08: 5 mg via ORAL

## 2024-02-08 MED ORDER — DOCUSATE SODIUM 100 MG PO CAPS
100.0000 mg | ORAL_CAPSULE | Freq: Two times a day (BID) | ORAL | Status: DC
  Administered 2024-02-08 – 2024-02-10 (×4): 100 mg via ORAL
  Filled 2024-02-08 (×4): qty 1

## 2024-02-08 MED ORDER — SODIUM CHLORIDE 0.9% FLUSH
3.0000 mL | INTRAVENOUS | Status: DC | PRN
Start: 2024-02-08 — End: 2024-02-10

## 2024-02-08 MED ORDER — PROPOFOL 10 MG/ML IV BOLUS
INTRAVENOUS | Status: DC | PRN
  Administered 2024-02-08: 115 ug/kg/min via INTRAVENOUS
  Administered 2024-02-08: 50 mg via INTRAVENOUS
  Administered 2024-02-08: 150 mg via INTRAVENOUS

## 2024-02-08 MED ORDER — SODIUM CHLORIDE 0.9 % IV SOLN: 250.0000 mL | INTRAVENOUS | Status: AC

## 2024-02-08 MED ORDER — CEFAZOLIN SODIUM-DEXTROSE 2-4 GM/100ML-% IV SOLN
INTRAVENOUS | Status: AC
  Filled 2024-02-08: qty 100

## 2024-02-08 MED ORDER — OXYCODONE HCL 5 MG PO TABS
ORAL_TABLET | ORAL | Status: AC
  Filled 2024-02-08: qty 1

## 2024-02-08 MED ORDER — PHENYLEPHRINE 80 MCG/ML (10ML) SYRINGE FOR IV PUSH (FOR BLOOD PRESSURE SUPPORT)
PREFILLED_SYRINGE | INTRAVENOUS | Status: DC | PRN
  Administered 2024-02-08: 80 ug via INTRAVENOUS

## 2024-02-08 MED ORDER — PHENYLEPHRINE HCL-NACL 20-0.9 MG/250ML-% IV SOLN
INTRAVENOUS | Status: DC | PRN
  Administered 2024-02-08: 50 ug/min via INTRAVENOUS

## 2024-02-08 MED ORDER — ACETAMINOPHEN 650 MG RE SUPP: 650.0000 mg | RECTAL | Status: DC | PRN

## 2024-02-08 MED ORDER — OXYCODONE HCL 5 MG/5ML PO SOLN: 5.0000 mg | Freq: Once | ORAL | Status: AC | PRN

## 2024-02-08 MED ORDER — CYCLOBENZAPRINE HCL 10 MG PO TABS
10.0000 mg | ORAL_TABLET | Freq: Three times a day (TID) | ORAL | Status: DC | PRN
  Administered 2024-02-08 – 2024-02-10 (×3): 10 mg via ORAL
  Filled 2024-02-08 (×3): qty 1

## 2024-02-08 MED ORDER — SURGIFLO WITH THROMBIN (HEMOSTATIC MATRIX KIT) OPTIME
TOPICAL | Status: DC | PRN
  Administered 2024-02-08: 1

## 2024-02-08 MED ORDER — ORAL CARE MOUTH RINSE: 15.0000 mL | Freq: Once | OROMUCOSAL | Status: DC

## 2024-02-08 MED ORDER — 0.9 % SODIUM CHLORIDE (POUR BTL) OPTIME
TOPICAL | Status: DC | PRN
  Administered 2024-02-08: 1000 mL

## 2024-02-08 MED ORDER — AMISULPRIDE (ANTIEMETIC) 5 MG/2ML IV SOLN: 10.0000 mg | Freq: Once | INTRAVENOUS | Status: DC | PRN

## 2024-02-08 MED ORDER — VANCOMYCIN HCL 1000 MG IV SOLR
INTRAVENOUS | Status: AC
  Filled 2024-02-08: qty 20

## 2024-02-08 MED ORDER — CHLORHEXIDINE GLUCONATE 0.12 % MT SOLN: 15.0000 mL | Freq: Once | OROMUCOSAL | Status: DC

## 2024-02-08 MED ORDER — LORATADINE 10 MG PO TABS
10.0000 mg | ORAL_TABLET | Freq: Every day | ORAL | Status: DC
  Administered 2024-02-09 – 2024-02-10 (×2): 10 mg via ORAL
  Filled 2024-02-08 (×2): qty 1

## 2024-02-08 MED ORDER — CEFAZOLIN SODIUM-DEXTROSE 2-4 GM/100ML-% IV SOLN
2.0000 g | Freq: Four times a day (QID) | INTRAVENOUS | Status: AC
  Administered 2024-02-08 – 2024-02-09 (×2): 2 g via INTRAVENOUS
  Filled 2024-02-08 (×2): qty 100

## 2024-02-08 MED ORDER — CEFAZOLIN SODIUM-DEXTROSE 2-4 GM/100ML-% IV SOLN
2.0000 g | INTRAVENOUS | Status: AC
  Administered 2024-02-08: 2 g via INTRAVENOUS

## 2024-02-08 MED ORDER — TRANEXAMIC ACID-NACL 1000-0.7 MG/100ML-% IV SOLN
INTRAVENOUS | Status: AC
  Filled 2024-02-08: qty 100

## 2024-02-08 MED ORDER — ROSUVASTATIN CALCIUM 20 MG PO TABS
40.0000 mg | ORAL_TABLET | Freq: Every day | ORAL | Status: DC
Start: 2024-02-08 — End: 2024-02-10
  Administered 2024-02-08 – 2024-02-10 (×3): 40 mg via ORAL
  Filled 2024-02-08 (×3): qty 2

## 2024-02-08 SURGICAL SUPPLY — 62 items
AGENT HEMOSTATIC FLOSEAL 10ML (HEMOSTASIS) ×2 IMPLANT
ALCOHOL 70% 16 OZ (MISCELLANEOUS) ×2 IMPLANT
BAG COUNTER SPONGE SURGICOUNT (BAG) ×2 IMPLANT
BENZOIN TINCTURE PRP APPL 2/3 (GAUZE/BANDAGES/DRESSINGS) ×2 IMPLANT
BIT DRILL 2.8 F/4 SCREW (DRILL) IMPLANT
BLADE BONE MILL MEDIUM (MISCELLANEOUS) ×2 IMPLANT
BLADE SURG 10 STRL SS (BLADE) ×2 IMPLANT
BLADE SURG 15 STRL LF DISP TIS (BLADE) ×2 IMPLANT
BLANKET WARM LOWER BOD BAIR (MISCELLANEOUS) ×2 IMPLANT
BUR MATCHSTICK NEURO 3.0 LAGG (BURR) ×2 IMPLANT
BUR PRESCISION 1.7 ELITE (BURR) ×2 IMPLANT
CANISTER SUCTION 3000ML PPV (SUCTIONS) ×2 IMPLANT
CANNULA GRAFT BNE VG PRE-FILL (Bone Implant) IMPLANT
CHLORAPREP W/TINT 26 (MISCELLANEOUS) ×2 IMPLANT
CLSR STERI-STRIP ANTIMIC 1/2X4 (GAUZE/BANDAGES/DRESSINGS) ×2 IMPLANT
CORD BIPOLAR FORCEPS 12FT (ELECTRODE) ×2 IMPLANT
COVER MAYO STAND STRL (DRAPES) ×4 IMPLANT
COVER SURGICAL LIGHT HANDLE (MISCELLANEOUS) ×2 IMPLANT
DISPENSER GRAFT BNE VG (MISCELLANEOUS) IMPLANT
DRAIN JP 7F FLT 3/4 PRF SI HBL (DRAIN) IMPLANT
DRAPE C-ARM 42X72 X-RAY (DRAPES) ×2 IMPLANT
DRAPE INCISE IOBAN 66X45 STRL (DRAPES) IMPLANT
DRAPE POUCH INSTRU U-SHP 10X18 (DRAPES) ×2 IMPLANT
DRAPE SURG 17X23 STRL (DRAPES) ×8 IMPLANT
DRSG AQUACEL AG ADV 3.5X 6 (GAUZE/BANDAGES/DRESSINGS) ×2 IMPLANT
DRSG OPSITE POSTOP 4X6 (GAUZE/BANDAGES/DRESSINGS) IMPLANT
ELECTRODE REM PT RTRN 9FT ADLT (ELECTROSURGICAL) ×2 IMPLANT
GAUZE SPONGE 4X4 12PLY STRL (GAUZE/BANDAGES/DRESSINGS) ×2 IMPLANT
GLOVE SURG SYN 8.0 PF PI (GLOVE) ×4 IMPLANT
GOWN STRL REUS W/ TWL LRG LVL3 (GOWN DISPOSABLE) ×2 IMPLANT
GOWN STRL REUS W/TWL 2XL LVL3 (GOWN DISPOSABLE) ×2 IMPLANT
KIT BASIN OR (CUSTOM PROCEDURE TRAY) ×2 IMPLANT
KIT TURNOVER KIT B (KITS) ×2 IMPLANT
NDL SPNL 18GX3.5 QUINCKE PK (NEEDLE) ×2 IMPLANT
NDL SPNL 22GX3.5 QUINCKE BK (NEEDLE) ×2 IMPLANT
NEEDLE SPNL 18GX3.5 QUINCKE PK (NEEDLE) ×1 IMPLANT
NEEDLE SPNL 22GX3.5 QUINCKE BK (NEEDLE) ×1 IMPLANT
NS IRRIG 1000ML POUR BTL (IV SOLUTION) ×2 IMPLANT
PAD ARMBOARD POSITIONER FOAM (MISCELLANEOUS) ×4 IMPLANT
PATTIES SURGICAL .5 X1 (DISPOSABLE) IMPLANT
PIN DISTRACTION 14MM (PIN) ×4 IMPLANT
PIN MAYFIELD SKULL DISP (PIN) ×2 IMPLANT
ROD EXPEDIUM 30MM (Rod) IMPLANT
ROD PRE BENT EXPEDIUM 35MM (Rod) IMPLANT
SCREW SET SINGLE INNER (Screw) IMPLANT
SCREW VIPER CORT FIX 5.00X30 (Screw) IMPLANT
SPACER TPAL 10MMX28MMX8MM (Spacer) IMPLANT
SPONGE INTESTINAL PEANUT (DISPOSABLE) IMPLANT
SPONGE SURGIFOAM ABS GEL 100 (HEMOSTASIS) ×2 IMPLANT
SUCTION TUBE FRAZIER 10FR DISP (SUCTIONS) ×2 IMPLANT
SURGIFLO W/THROMBIN 8M KIT (HEMOSTASIS) IMPLANT
SUT BONE WAX W31G (SUTURE) ×2 IMPLANT
SUT ETHILON 3 0 PS 1 (SUTURE) IMPLANT
SUT MNCRL AB 3-0 PS2 18 (SUTURE) ×2 IMPLANT
SUT VIC AB 1 CT1 27XBRD ANTBC (SUTURE) ×2 IMPLANT
SUT VIC AB 2-0 CT2 27 (SUTURE) ×2 IMPLANT
SYR BULB EAR ULCER 3OZ GRN STR (SYRINGE) IMPLANT
SYR CONTROL 10ML LL (SYRINGE) ×2 IMPLANT
TAP EXPEDIUM DL 4.35 (INSTRUMENTS) IMPLANT
TOWEL GREEN STERILE (TOWEL DISPOSABLE) ×2 IMPLANT
TUBE CONNECTING 20X1/4 (TUBING) ×2 IMPLANT
WATER STERILE IRR 1000ML POUR (IV SOLUTION) ×2 IMPLANT

## 2024-02-08 NOTE — Op Note (Signed)
 PATIENT NAME: Diane Watkins   MEDICAL RECORD NO.:   [id number]   DATE OF BIRTH: DOB@    DATE OF PROCEDURE: TD@                                OPERATIVE REPORT     PREOPERATIVE DIAGNOSES: 1. bilateral lumbar radiculopathy. 2. L4-5 spinal stenosis. 3. L4-5 Spondylolisthesis   POSTOPERATIVE DIAGNOSES: 1. bilateral lumbar radiculopathy. 2. L4-5 spinal stenosis. 3.  L4-5 Spondylolisthesis   PROCEDURES: 1.  Right-sided L4-5 transforaminal lumbar interbody fusion with posterior fusion. 2. L4/5 decompression 3. Insertion of interbody device x1 .T-PALs Prot 10x28x8 4. Placement of segmental posterior instrumentation L4, L5 bilaterally Depuy Cortifx 5x30 5. Use of local autograft. 6. Use of morselized allograft (Vivogen) 7. Intraoperative use of fluoroscopy.   SURGEON:  Cordella Rhein, MD.   ASSISTANT:  ...   ANESTHESIA:  General endotracheal anesthesia.   COMPLICATIONS:  None.   DISPOSITION:  Stable.   ESTIMATED BLOOD LOSS:  100   INDICATIONS FOR SURGERY:    Diane Watkins is a pleasant 30 -year-old woman who did present to me with severe and ongoing pain back and in the rbilateral leg.  I did feel that the symptoms were secondary to the findings noted above.   The patient failed conservative care and did wish to proceed with the procedure  noted above.   OPERATIVE DETAILS:  On TODAY@ , the patient was brought to surgery and general endotracheal anesthesia was administered.  The patient was placed prone on a well-padded flat Jackson bed with a spinal frame.  Antibiotics were given and a time-out procedure was performed. The back was prepped and draped in the usual fashion.  The skin was anesthetized with 0.25% marcaine  with epi.   The L4-5 level was localized using lateral fluoroscopic imaging.  A midline incision was made overlying the L4-5 intervertebral spaces.  The fascia was incised at the midline.  Dissection was carried out down to the fascia.  The fascia was incised  in line with its fibers.  Electrocautery and a Cobb elevator were used to dissect along the spinous processes. The L4-5 was confirmed using laparoscopic imaging.  We then began the laminectomy.  A rondure was used to remove the inferior portion of the spinous process from L4 and the superior portion of the spinous process of L5.  This is also used to remove the inferior lamina from L4.  A bur was then used to remove additional bone.  A dental probe was used dissect underneath the ligamentous structures.  These were then removed with Kerrison punches.  On the right side we removed the entire facet joint.  We exposed the transversing and exiting nerve roots.  On the contralateral side, dissection was carried out through the neuroforamen using Kerrison punches until a hockey-stick style probe could pass freely through the foramen with no residual compression.    We then prepared the disc space.  On the right side, the thecal sac was retracted medially and protected with a nerve root retractor.  The disc space was incised with a 15 blade.  A curette and pituitary rongeurs were used to remove disc material.  Sequential dilators were used.  We then used the trials.  A size 10x28x9 cage TPAs from depuy was placed.  This improves been ailed with the demineralized bone matrix.  Additional bone graft and vivogen was placed into the disc space around the  cage.  We next cannulated our cortical trajectory pedicle screws.  The area of the pars just inferior to the facet joint was localized.  A 2 5 drill bit was used advanced and under lateral fluoroscopic imaging aiming both superiorly and laterally.  We palpated the screw track with a ball-tipped probe.  This is felt to be fully within bone.  A tap was then used.  We then placed depuy cortifix 5mm x 30mm screws into the screw tracts.  This is bilaterally at both L4 and L5.  All screws had excellent purchase and bite.  A rod was placed followed by locking caps.  These were  tightened into place and final tightened.  We then decorticated the facet joint on the contralateral side as well as the transverse processes bilaterally.  A combination of local bone demineralized bone matrix and allograft were placed over these areas.  Final fluoroscopic images were obtained which showed acceptable position of a transforaminal lumbar interbody fusion at the L4-5 level  The wound was copiously irrigated.  All bleeding was stopped with Surgi-Flo Gelfoam patties.  No CSF was noted to leak.  Vancomycin  powder was placed into the wound.  The fascia was closed with #1 Vicryl.  2-0 Vicryl was used for the deep dermal layers.  The skin was closed with 4-0 Monocryl.  The wound was then washed and dressed with benzoin Steri-Strips 4 x 4's and Tegaderm.  The patient was woken from anesthesia and transferred to the recovery in stable condition. explored for any undue bleeding and    Cordella Rhein, MD, [facility] [phone]

## 2024-02-08 NOTE — Anesthesia Preprocedure Evaluation (Signed)
 Anesthesia Evaluation  Patient identified by MRN, date of birth, ID band Patient awake    Reviewed: Allergy & Precautions, NPO status , Patient's Chart, lab work & pertinent test results  Airway Mallampati: II  TM Distance: >3 FB     Dental no notable dental hx. (+) Teeth Intact, Dental Advisory Given   Pulmonary neg pulmonary ROS   Pulmonary exam normal breath sounds clear to auscultation       Cardiovascular hypertension, Pt. on medications Normal cardiovascular exam Rhythm:Regular Rate:Normal     Neuro/Psych negative neurological ROS  negative psych ROS   GI/Hepatic hiatal hernia,GERD  Medicated,,  Endo/Other  diabetes, Well Controlled, Type 2  Obesity HLD  Renal/GU   negative genitourinary   Musculoskeletal  (+) Arthritis , Osteoarthritis,  Neurogenic claudication due to lumbar spinal stenosis Spondylolisthesis at L4-L5     Abdominal  (+) + obese  Peds  Hematology   Anesthesia Other Findings   Reproductive/Obstetrics                              Anesthesia Physical Anesthesia Plan  ASA: 2  Anesthesia Plan: General   Post-op Pain Management: Minimal or no pain anticipated   Induction: Intravenous  PONV Risk Score and Plan: 4 or greater and Treatment may vary due to age or medical condition, Ondansetron  and Dexamethasone   Airway Management Planned: Oral ETT  Additional Equipment: None  Intra-op Plan:   Post-operative Plan: Extubation in OR  Informed Consent: I have reviewed the patients History and Physical, chart, labs and discussed the procedure including the risks, benefits and alternatives for the proposed anesthesia with the patient or authorized representative who has indicated his/her understanding and acceptance.     Dental advisory given  Plan Discussed with: CRNA and Anesthesiologist  Anesthesia Plan Comments:         Anesthesia Quick Evaluation

## 2024-02-08 NOTE — Anesthesia Postprocedure Evaluation (Signed)
 Anesthesia Post Note  Patient: Diane Watkins  Procedure(s) Performed: TRANSFORAMINAL LUMBAR INTERBODY FUSION (TLIF) WITH PEDICLE SCREW FIXATION 1 LEVEL (Spine Lumbar)     Patient location during evaluation: PACU Anesthesia Type: General Level of consciousness: awake and alert and oriented Pain management: pain level controlled Vital Signs Assessment: post-procedure vital signs reviewed and stable Respiratory status: spontaneous breathing, nonlabored ventilation and respiratory function stable Cardiovascular status: blood pressure returned to baseline and stable Postop Assessment: no apparent nausea or vomiting Anesthetic complications: no   No notable events documented.  Last Vitals:  Vitals:   02/08/24 1830 02/08/24 1854  BP: (!) 147/88 (!) 142/79  Pulse: 76 94  Resp: 17 18  Temp: (!) 36.4 C   SpO2: 97% 95%    Last Pain:  Vitals:   02/08/24 1823  TempSrc:   PainSc: 7                  Janus Vlcek A.

## 2024-02-08 NOTE — Anesthesia Procedure Notes (Signed)
 Procedure Name: Intubation Date/Time: 02/08/2024 3:22 PM  Performed by: Obadiah Reyes BROCKS, CRNAPre-anesthesia Checklist: Patient identified, Emergency Drugs available, Suction available and Patient being monitored Patient Re-evaluated:Patient Re-evaluated prior to induction Oxygen Delivery Method: Circle System Utilized Preoxygenation: Pre-oxygenation with 100% oxygen Induction Type: IV induction Ventilation: Mask ventilation without difficulty Laryngoscope Size: Miller and 2 Grade View: Grade I Tube type: Oral Number of attempts: 1 Airway Equipment and Method: Stylet and Oral airway Placement Confirmation: ETT inserted through vocal cords under direct vision, positive ETCO2 and breath sounds checked- equal and bilateral Secured at: 21 cm Tube secured with: Tape Dental Injury: Teeth and Oropharynx as per pre-operative assessment

## 2024-02-08 NOTE — Transfer of Care (Signed)
 Immediate Anesthesia Transfer of Care Note  Patient: Diane Watkins  Procedure(s) Performed: TRANSFORAMINAL LUMBAR INTERBODY FUSION (TLIF) WITH PEDICLE SCREW FIXATION 1 LEVEL (Spine Lumbar)  Patient Location: PACU  Anesthesia Type:General  Level of Consciousness: drowsy  Airway & Oxygen Therapy: Patient Spontanous Breathing and Patient connected to face mask oxygen  Post-op Assessment: Report given to RN and Post -op Vital signs reviewed and stable  Post vital signs: Reviewed and stable  Last Vitals:  Vitals Value Taken Time  BP 125/63 02/08/24 17:28  Temp 97.5   Pulse 90 02/08/24 17:32  Resp 20 02/08/24 17:32  SpO2 99 % 02/08/24 17:32  Vitals shown include unfiled device data.  Last Pain:  Vitals:   02/08/24 1012  TempSrc: Oral  PainSc: 0-No pain         Complications: No notable events documented.

## 2024-02-08 NOTE — Interval H&P Note (Signed)
 History and Physical Interval Note:  02/08/2024 1:11 PM  Diane Watkins  has presented today for surgery, with the diagnosis of Neurogenic claudication due to lumbar spinal stenosis, Spondylolisthesis at L4-L5 level.  The various methods of treatment have been discussed with the patient and family. After consideration of risks, benefits and other options for treatment, the patient has consented to  Procedure(s) with comments: TRANSFORAMINAL LUMBAR INTERBODY FUSION (TLIF) WITH PEDICLE SCREW FIXATION 1 LEVEL (N/A) - Transforaminal lumber interbody fusion with pedicle screw fixation, lumbar 4-lumbar 5 as a surgical intervention.  The patient's history has been reviewed, patient examined, no change in status, stable for surgery.  I have reviewed the patient's chart and labs.  Questions were answered to the patient's satisfaction.     [name]

## 2024-02-09 LAB — BASIC METABOLIC PANEL WITH GFR
Anion gap: 10 (ref 5–15)
BUN: 15 mg/dL (ref 8–23)
CO2: 22 mmol/L (ref 22–32)
Calcium: 8.9 mg/dL (ref 8.9–10.3)
Chloride: 102 mmol/L (ref 98–111)
Creatinine, Ser: 0.95 mg/dL (ref 0.44–1.00)
GFR, Estimated: >60 mL/min (ref >60)
Glucose, Bld: 228 mg/dL — ABNORMAL HIGH (ref 70–99)
Potassium: 4.1 mmol/L (ref 3.5–5.1)
Sodium: 134 mmol/L — ABNORMAL LOW (ref 135–145)

## 2024-02-09 LAB — CBC
HCT: 33.8 % — ABNORMAL LOW (ref 36.0–46.0)
Hemoglobin: 11.5 g/dL — ABNORMAL LOW (ref 12.0–15.0)
MCH: 31.7 pg (ref 26.0–34.0)
MCHC: 34 g/dL (ref 30.0–36.0)
MCV: 93.1 fL (ref 80.0–100.0)
Platelets: 272 K/uL (ref 150–400)
RBC: 3.63 MIL/uL — ABNORMAL LOW (ref 3.87–5.11)
RDW: 13.6 % (ref 11.5–15.5)
WBC: 13.3 K/uL — ABNORMAL HIGH (ref 4.0–10.5)
nRBC: 0 % (ref 0.0–0.2)

## 2024-02-09 MED ORDER — OXYCODONE-ACETAMINOPHEN 5-325 MG PO TABS
1.0000 | ORAL_TABLET | ORAL | Status: DC | PRN
  Administered 2024-02-09 – 2024-02-10 (×3): 2 via ORAL
  Filled 2024-02-09 (×4): qty 2

## 2024-02-09 MED ORDER — DIPHENHYDRAMINE HCL 25 MG PO CAPS
25.0000 mg | ORAL_CAPSULE | Freq: Four times a day (QID) | ORAL | Status: DC | PRN
  Administered 2024-02-10: 25 mg via ORAL
  Filled 2024-02-09: qty 1

## 2024-02-09 MED ORDER — KETOROLAC TROMETHAMINE 15 MG/ML IJ SOLN
15.0000 mg | Freq: Four times a day (QID) | INTRAMUSCULAR | Status: DC | PRN
  Administered 2024-02-09 – 2024-02-10 (×5): 15 mg via INTRAVENOUS
  Filled 2024-02-09 (×5): qty 1

## 2024-02-09 NOTE — Progress Notes (Signed)
 Subjective: Procedure(s) (LRB): TRANSFORAMINAL LUMBAR INTERBODY FUSION (TLIF) WITH PEDICLE SCREW FIXATION 1 LEVEL (N/A) 1 Day Post-Op  Patient reports pain as moderate.  Reports decreased leg pain reports incisional back pain   Positive void Negative bowel movement Positive flatus Negative chest pain or shortness of breath  Objective: Vital signs in last 24 hours: Temp:  [97.5 F (36.4 C)-98.7 F (37.1 C)] 98.5 F (36.9 C) (08/26 0255) Pulse Rate:  [76-104] 100 (08/26 0255) Resp:  [12-18] 18 (08/26 0255) BP: (123-163)/(52-112) 131/52 (08/26 0255) SpO2:  [92 %-100 %] 94 % (08/26 0255)  Intake/Output from previous day: 08/25 0701 - 08/26 0700 In: 2290 [P.O.:240; I.V.:1800; IV Piggyback:250] Out: 200 [Blood:200]  Labs: No results for input(s): WBC, RBC, HCT, PLT in the last 72 hours. No results for input(s): NA, K, CL, CO2, BUN, CREATININE, GLUCOSE, CALCIUM  in the last 72 hours. No results for input(s): LABPT, INR in the last 72 hours.  Physical Exam: Dressing C/D/I There is no height or weight on file to calculate BMI.  Incision C/D/I  Sensation     Right      Left  L3   Intact     Intact L4   Intact     Intact L5    Intact     Intact S1   Intact     Intact    Motor Exam     Right     Left Iliopsoas  5/5     5/5  Quad   5/5     5/5  Hamstring  5/5     5/5  Tibials Anterior 5/5     5/5  EHL   5/5     5/5  Gastrocs  5/5     5/5      Assessment/Plan: Patient stable  Will add toradol  for pain Continue with narcotic pain medication Continue mobilization with physical therapy Continue care  Up with therapy Plan for discharge tomorrow Will depend on progress with therapy  Cordella Rhein, MD, MS [facility] [phone]

## 2024-02-09 NOTE — Evaluation (Signed)
 Occupational Therapy Evaluation Patient Details Name: Diane Watkins MRN: [id number] DOB: 1950-12-16 Today's Date: 02/09/2024   History of Present Illness   Pt is a 73 y.o. female s/p TLIF with pedicle screw fixation L4-5. PMH: HTN, DMII, GERD, HLD, Bil knee osteoarthritis.     Clinical Impressions PTA, pt lived with spouse and was independent. Pt reports good family support from children and daughter in law at dc. Upon eval, pt performing UB ADL with set-up and LB ADL with up to mod A. Pt educated and demonstrating use of compensatory techniques for bed mobility, LB ADL, grooming, toileting, and shower transfers within precautions. Will follow one more session for AE education.  Recommending discharge home with no OT follow up at this time.       If plan is discharge home, recommend the following:   A little help with walking and/or transfers;A little help with bathing/dressing/bathroom;Assistance with cooking/housework;Assist for transportation;Help with stairs or ramp for entrance     Functional Status Assessment   Patient has had a recent decline in their functional status and demonstrates the ability to make significant improvements in function in a reasonable and predictable amount of time.     Equipment Recommendations   None recommended by OT     Recommendations for Other Services         Precautions/Restrictions   Precautions Precautions: Back;Fall Precaution Booklet Issued: Yes (comment) Recall of Precautions/Restrictions: Impaired Precaution/Restrictions Comments: pt needing mod-max cues initially to recall precautions with repeated education. pt able to implement functionally afterward with indirect cues Restrictions Weight Bearing Restrictions Per Provider Order: No     Mobility Bed Mobility               General bed mobility comments: with PT on entry and EOB with daughter in law at bedside on OT departure    Transfers Overall transfer  level: Needs assistance Equipment used: Rolling walker (2 wheels) Transfers: Sit to/from Stand Sit to Stand: Supervision           General transfer comment: Verbal cues for hand placement for rise and descent      Balance Overall balance assessment: Needs assistance Sitting-balance support: Feet supported Sitting balance-Leahy Scale: Good     Standing balance support: Bilateral upper extremity supported Standing balance-Leahy Scale: Poor                             ADL either performed or assessed with clinical judgement   ADL Overall ADL's : Needs assistance/impaired Eating/Feeding: Sitting;Modified independent   Grooming: Supervision/safety;Standing   Upper Body Bathing: Set up;Sitting   Lower Body Bathing: Moderate assistance;Sit to/from stand   Upper Body Dressing : Set up;Sitting   Lower Body Dressing: Moderate assistance;Sit to/from stand   Toilet Transfer: Contact guard assist;Supervision/safety;Rolling walker (2 wheels)     Toileting - Clothing Manipulation Details (indicate cue type and reason): reviewed compensatory techniques   Tub/Shower Transfer Details (indicate cue type and reason): reviewed compensatory techniques. pt daughter in law present and reporting that she has a tub bench pt can use; reviewed safe use of tub bench Functional mobility during ADLs: Contact guard assist;Supervision/safety;Rolling walker (2 wheels)       Vision         Perception         Praxis         Pertinent Vitals/Pain Pain Assessment Pain Assessment: Faces Faces Pain Scale: Hurts even more Pain Location: back  Pain Descriptors / Indicators: Operative site guarding, Grimacing Pain Intervention(s): Limited activity within patient's tolerance, Monitored during session     Extremity/Trunk Assessment Upper Extremity Assessment Upper Extremity Assessment: Generalized weakness   Lower Extremity Assessment Lower Extremity Assessment: Defer to PT  evaluation RLE Deficits / Details: Grossly 4+/5 LLE Deficits / Details: Grossly 4+/5   Cervical / Trunk Assessment Cervical / Trunk Assessment: Back Surgery   Communication Communication Communication: No apparent difficulties   Cognition Arousal: Alert Behavior During Therapy: WFL for tasks assessed/performed Cognition: Cognition impaired       Memory impairment (select all impairments): Short-term memory     OT - Cognition Comments: good awareness of decr STM however                 Following commands: Intact       Cueing  General Comments   Cueing Techniques: Verbal cues      Exercises     Shoulder Instructions      Home Living Family/patient expects to be discharged to:: Private residence Living Arrangements: Spouse/significant other Available Help at Discharge: Family Type of Home: House Home Access: Stairs to enter [employer] of Steps: 4 Entrance Stairs-Rails: Right;Left Home Layout: One level;Other (Comment) (2 steps down to bedroom)     Bathroom Shower/Tub: Tub/shower unit         Home Equipment: Patent examiner (4 wheels);Cane - single point          Prior Functioning/Environment Prior Level of Function : Independent/Modified Independent             Mobility Comments: Enjoys working in yard, recently Chemical engineer, or furniture for balance ADLs Comments: Independent    OT Problem List: Decreased strength;Decreased activity tolerance;Impaired balance (sitting and/or standing);Decreased knowledge of use of DME or AE;Decreased knowledge of precautions;Pain   OT Treatment/Interventions: Self-care/ADL training;Therapeutic exercise;DME and/or AE instruction;Therapeutic activities;Patient/family education;Balance training      OT Goals(Current goals can be found in the care plan section)   Acute Rehab OT Goals Patient Stated Goal: get better OT Goal Formulation: With patient Time For Goal  Achievement: 02/23/24 Potential to Achieve Goals: Good   OT Frequency:  Min 2X/week    Co-evaluation              AM-PAC OT 6 Clicks Daily Activity     Outcome Measure Help from another person eating meals?: None Help from another person taking care of personal grooming?: A Little Help from another person toileting, which includes using toliet, bedpan, or urinal?: A Little Help from another person bathing (including washing, rinsing, drying)?: A Little Help from another person to put on and taking off regular upper body clothing?: A Little Help from another person to put on and taking off regular lower body clothing?: A Little 6 Click Score: 19   End of Session Equipment Utilized During Treatment: Rolling walker (2 wheels) Nurse Communication: Mobility status  Activity Tolerance: Patient tolerated treatment well Patient left: Other (comment) (EOB with daughter in law; able to verbalize log roll techniques)  OT Visit Diagnosis: Unsteadiness on feet (R26.81);Muscle weakness (generalized) (M62.81);Pain                Time: 9181-9159 OT Time Calculation (min): 22 min Charges:  OT General Charges $OT Visit: 1 Visit OT Evaluation $OT Eval Low Complexity: 1 Low  Elma JONETTA Lebron FREDERICK, OTR/L [facility] Acute Rehabilitation Office: [phone]   Elma JONETTA Lebron 02/09/2024, 9:03 AM

## 2024-02-09 NOTE — Evaluation (Addendum)
 Physical Therapy Evaluation Patient Details Name: Diane Watkins MRN: [id number] DOB: [birth year] Today's Date: 02/09/2024  History of Present Illness  Pt is a 73 y.o. female s/p TLIF with pedicle screw fixation L4-5. PMH: HTN, DMII, GERD, HLD, Bil knee osteoarthritis.  Clinical Impression  PTA, pt lives with her spouse, is modI with ambulation using Rollator and is independent with ADL's. Pt reports surgical pain, but denies radicular symptoms or numbness/tingling. Pt overall is mobilizing fairly, performing transfers and ambulating 150 ft with a walker with slow pace. Benefits from RW for external support and pain management. Reviewed spinal precautions and activity recommendations. Plan for stair training in the AM.    ;'  If plan is discharge home, recommend the following: A little help with bathing/dressing/bathroom;Assistance with cooking/housework;Assist for transportation   Can travel by private vehicle        Equipment Recommendations Rolling walker (2 wheels)  Recommendations for Other Services       Functional Status Assessment Patient has had a recent decline in their functional status and demonstrates the ability to make significant improvements in function in a reasonable and predictable amount of time.     Precautions / Restrictions Precautions Precautions: Back;Fall Precaution Booklet Issued: Yes (comment) Recall of Precautions/Restrictions: Impaired Restrictions Weight Bearing Restrictions Per Provider Order: No      Mobility  Bed Mobility               General bed mobility comments: Sitting EOB upon entry    Transfers Overall transfer level: Needs assistance Equipment used: Rolling walker (2 wheels) Transfers: Sit to/from Stand Sit to Stand: Supervision           General transfer comment: Verbal cues for hand placement    Ambulation/Gait Ambulation/Gait assistance: Supervision Gait Distance (Feet): 150 Feet Assistive device: Rolling  walker (2 wheels) Gait Pattern/deviations: Step-through pattern, Decreased stride length Gait velocity: decreased Gait velocity interpretation: <1.8 ft/sec, indicate of risk for recurrent falls   General Gait Details: Pt self cueing for scapular depression, slow pace, moderate reliance through arms on walker  Stairs            Wheelchair Mobility     Tilt Bed    Modified Rankin (Stroke Patients Only)       Balance Overall balance assessment: Needs assistance Sitting-balance support: Feet supported Sitting balance-Leahy Scale: Good     Standing balance support: Bilateral upper extremity supported Standing balance-Leahy Scale: Poor                               Pertinent Vitals/Pain Pain Assessment Pain Assessment: Faces Faces Pain Scale: Hurts even more Pain Location: back Pain Descriptors / Indicators: Operative site guarding, Grimacing Pain Intervention(s): Limited activity within patient's tolerance, Monitored during session    Home Living Family/patient expects to be discharged to:: Private residence Living Arrangements: Spouse/significant other Available Help at Discharge: Family Type of Home: House Home Access: Stairs to enter Entrance Stairs-Rails: Doctor, general practice of Steps: 4   Home Layout: One level;Other (Comment) (2 steps down to bedroom) Home Equipment: Patent examiner (4 wheels);Cane - single point      Prior Function Prior Level of Function : Independent/Modified Independent             Mobility Comments: Enjoys working in yard, recently Chemical engineer, or furniture for balance ADLs Comments: Independent     Extremity/Trunk Assessment   Upper Extremity Assessment Upper  Extremity Assessment: Defer to OT evaluation    Lower Extremity Assessment Lower Extremity Assessment: RLE deficits/detail;LLE deficits/detail RLE Deficits / Details: Grossly 4+/5 LLE Deficits / Details:  Grossly 4+/5    Cervical / Trunk Assessment Cervical / Trunk Assessment: Back Surgery  Communication   Communication Communication: No apparent difficulties    Cognition Arousal: Alert Behavior During Therapy: WFL for tasks assessed/performed   PT - Cognitive impairments: No apparent impairments                         Following commands: Intact       Cueing Cueing Techniques: Verbal cues     General Comments      Exercises     Assessment/Plan    PT Assessment Patient needs continued PT services  PT Problem List Decreased strength;Decreased activity tolerance;Decreased mobility;Decreased balance;Pain       PT Treatment Interventions DME instruction;Gait training;Functional mobility training;Stair training;Therapeutic activities;Therapeutic exercise;Balance training;Patient/family education    PT Goals (Current goals can be found in the Care Plan section)  Acute Rehab PT Goals Patient Stated Goal: to return to yard work PT Goal Formulation: With patient Time For Goal Achievement: 02/23/24 Potential to Achieve Goals: Good    Frequency Min 5X/week     Co-evaluation               AM-PAC PT 6 Clicks Mobility  Outcome Measure Help needed turning from your back to your side while in a flat bed without using bedrails?: None Help needed moving from lying on your back to sitting on the side of a flat bed without using bedrails?: A Little Help needed moving to and from a bed to a chair (including a wheelchair)?: A Little Help needed standing up from a chair using your arms (e.g., wheelchair or bedside chair)?: A Little Help needed to walk in hospital room?: A Little Help needed climbing 3-5 steps with a railing? : A Little 6 Click Score: 19    End of Session   Activity Tolerance: Patient tolerated treatment well Patient left: Other (comment) (with OT) Nurse Communication: Mobility status PT Visit Diagnosis: Pain;Difficulty in walking, not  elsewhere classified (R26.2) Pain - part of body:  (back)    Time: 9248-9184 PT Time Calculation (min) (ACUTE ONLY): 24 min   Charges:   PT Evaluation $PT Eval Low Complexity: 1 Low PT Treatments $Therapeutic Activity: 8-22 mins PT General Charges $$ ACUTE PT VISIT: 1 Visit         Aleck Daring, PT, DPT Acute Rehabilitation Services Office [phone]   Alayne ONEIDA Daring 02/09/2024, 8:28 AM

## 2024-02-10 MED ORDER — OXYCODONE-ACETAMINOPHEN 5-325 MG PO TABS: 1.0000 | ORAL_TABLET | ORAL | Status: DC | PRN

## 2024-02-10 MED ORDER — CYCLOBENZAPRINE HCL 10 MG PO TABS: 10.0000 mg | ORAL_TABLET | Freq: Three times a day (TID) | ORAL | Status: DC | PRN

## 2024-02-10 NOTE — Progress Notes (Addendum)
 Physical Therapy Treatment Patient Details Name: Diane Watkins MRN: [id number] DOB: Nov 01, 1950 Today's Date: 02/10/2024   History of Present Illness Pt is a 73 y.o. female s/p TLIF with pedicle screw fixation L4-5. PMH: HTN, DMII, GERD, HLD, Bil knee osteoarthritis.    PT Comments  Pt progressing well with post-op mobility. She was able to demonstrate transfers and ambulation with gross supervision for safety and RW for support. Reinforced education on precautions, positioning recommendations, appropriate activity progression, and car transfer. Will continue to follow.      If plan is discharge home, recommend the following: A little help with bathing/dressing/bathroom;Assistance with cooking/housework;Assist for transportation   Can travel by private vehicle        Equipment Recommendations  Rolling walker (2 wheels)    Recommendations for Other Services       Precautions / Restrictions Precautions Precautions: Back;Fall Precaution Booklet Issued: Yes (comment) Recall of Precautions/Restrictions: Impaired Precaution/Restrictions Comments: Reviewed precautions verbally during functional mobility. Restrictions Weight Bearing Restrictions Per Provider Order: No     Mobility  Bed Mobility Overal bed mobility: Needs Assistance Bed Mobility: Sidelying to Sit, Sit to Sidelying   Sidelying to sit: Supervision     Sit to sidelying: Supervision General bed mobility comments: HOB flat and rails lowered to simulate home environment.    Transfers Overall transfer level: Needs assistance Equipment used: Rolling walker (2 wheels) Transfers: Sit to/from Stand Sit to Stand: Supervision           General transfer comment: VC's for hand placement on seated surface for safety. No assist required.    Ambulation/Gait Ambulation/Gait assistance: Supervision Gait Distance (Feet): 300 Feet Assistive device: Rolling walker (2 wheels) (rail in hall) Gait Pattern/deviations:  Step-through pattern, Decreased stride length, Trunk flexed Gait velocity: decreased Gait velocity interpretation: 1.31 - 2.62 ft/sec, indicative of limited community ambulator   General Gait Details: Initially with RW and motivated to attempt with RUE support only as she is used to using her cane PTA. Pt steady and without gross imbalance, however pt reports feeling more comfortable with the RW for now.   Stairs Stairs: Yes Stairs assistance: Contact guard assist Stair Management: One rail Right, Step to pattern, Forwards Number of Stairs: 4 General stair comments: VC's for sequencing and general safety.   Wheelchair Mobility     Tilt Bed    Modified Rankin (Stroke Patients Only)       Balance Overall balance assessment: Needs assistance Sitting-balance support: Feet supported Sitting balance-Leahy Scale: Good     Standing balance support: Bilateral upper extremity supported Standing balance-Leahy Scale: Poor                              Communication Communication Communication: No apparent difficulties  Cognition Arousal: Alert Behavior During Therapy: WFL for tasks assessed/performed   PT - Cognitive impairments: No apparent impairments                         Following commands: Intact      Cueing Cueing Techniques: Verbal cues  Exercises      General Comments        Pertinent Vitals/Pain Pain Assessment Pain Assessment: Faces Faces Pain Scale: Hurts little more Pain Location: back Pain Descriptors / Indicators: Aching, Headache Pain Intervention(s): Limited activity within patient's tolerance, Monitored during session, Repositioned    Home Living  Prior Function            PT Goals (current goals can now be found in the care plan section) Acute Rehab PT Goals Patient Stated Goal: to return to yard work PT Goal Formulation: With patient Time For Goal Achievement: 02/23/24 Potential  to Achieve Goals: Good Progress towards PT goals: Progressing toward goals    Frequency    Min 5X/week      PT Plan      Co-evaluation              AM-PAC PT 6 Clicks Mobility   Outcome Measure  Help needed turning from your back to your side while in a flat bed without using bedrails?: None Help needed moving from lying on your back to sitting on the side of a flat bed without using bedrails?: A Little Help needed moving to and from a bed to a chair (including a wheelchair)?: A Little Help needed standing up from a chair using your arms (e.g., wheelchair or bedside chair)?: A Little Help needed to walk in hospital room?: A Little Help needed climbing 3-5 steps with a railing? : A Little 6 Click Score: 19    End of Session Equipment Utilized During Treatment: Gait belt Activity Tolerance: Patient tolerated treatment well Patient left: in bed;with call bell/phone within reach;with family/visitor present Nurse Communication: Mobility status PT Visit Diagnosis: Pain;Difficulty in walking, not elsewhere classified (R26.2) Pain - part of body:  (back)     Time: 8651-8588 PT Time Calculation (min) (ACUTE ONLY): 23 min  Charges:    $Gait Training: 23-37 mins PT General Charges $$ ACUTE PT VISIT: 1 Visit                     Leita Sable, PT, DPT Acute Rehabilitation Services Secure Chat Preferred Office: [phone]    Leita JONETTA Sable 02/10/2024, 2:29 PM

## 2024-02-10 NOTE — Progress Notes (Signed)
 Occupational Therapy Treatment Patient Details Name: Diane Watkins MRN: [id number] DOB: Nov 21, 1950 Today's Date: 02/10/2024   History of present illness Pt is a 73 y.o. female s/p TLIF with pedicle screw fixation L4-5. PMH: HTN, DMII, GERD, HLD, Bil knee osteoarthritis.   OT comments  Pt progressing toward established OT goals. Focus session on education for LB ADL and toileting inclusive of use of compensatory techniques and use of AE to perform within her spinal precautions. Pt declines need for practicing tub transfer with transfer bench due to daughter in law with experience using this at home. Will continue to follow. No follow up rOT after discharge recommended at this time.       If plan is discharge home, recommend the following:  A little help with walking and/or transfers;A little help with bathing/dressing/bathroom;Assistance with cooking/housework;Assist for transportation;Help with stairs or ramp for entrance   Equipment Recommendations  None recommended by OT    Recommendations for Other Services      Precautions / Restrictions Precautions Precautions: Back;Fall Precaution Booklet Issued: Yes (comment) Recall of Precautions/Restrictions: Impaired       Mobility Bed Mobility               General bed mobility comments: EOB on arrival and departure    Transfers Overall transfer level: Needs assistance Equipment used: Rolling walker (2 wheels) Transfers: Sit to/from Stand Sit to Stand: Supervision           General transfer comment: Verbal cues for hand placement for rise and descent     Balance Overall balance assessment: Needs assistance Sitting-balance support: Feet supported Sitting balance-Leahy Scale: Good                                     ADL either performed or assessed with clinical judgement   ADL Overall ADL's : Needs assistance/impaired Eating/Feeding: Sitting;Modified independent Eating/Feeding Details (indicate  cue type and reason): finishing breakfast on arrival                 Lower Body Dressing: Contact guard assist;Sit to/from stand;With adaptive equipment Lower Body Dressing Details (indicate cue type and reason): after initial education Toilet Transfer: Contact guard assist;Supervision/safety;Rolling walker (2 wheels)                  Extremity/Trunk Assessment Upper Extremity Assessment Upper Extremity Assessment: Generalized weakness   Lower Extremity Assessment Lower Extremity Assessment: Defer to PT evaluation        Vision       Perception     Praxis     Communication Communication Communication: No apparent difficulties   Cognition Arousal: Alert Behavior During Therapy: WFL for tasks assessed/performed Cognition: Cognition impaired       Memory impairment (select all impairments): Short-term memory                       Following commands: Intact        Cueing   Cueing Techniques: Verbal cues  Exercises      Shoulder Instructions       General Comments      Pertinent Vitals/ Pain       Pain Assessment Pain Assessment: Faces Faces Pain Scale: Hurts little more Pain Location: HA, bil LE pain Pain Descriptors / Indicators: Aching, Headache Pain Intervention(s): Limited activity within patient's tolerance  Home Living  Prior Functioning/Environment              Frequency  Min 2X/week        Progress Toward Goals  OT Goals(current goals can now be found in the care plan section)  Progress towards OT goals: Progressing toward goals  Acute Rehab OT Goals Patient Stated Goal: get better OT Goal Formulation: With patient Time For Goal Achievement: 02/23/24 Potential to Achieve Goals: Good ADL Goals Pt Will Perform Lower Body Dressing: with supervision;sit to/from stand;with adaptive equipment  Plan      Co-evaluation                 AM-PAC OT  6 Clicks Daily Activity     Outcome Measure   Help from another person eating meals?: None Help from another person taking care of personal grooming?: A Little Help from another person toileting, which includes using toliet, bedpan, or urinal?: A Little Help from another person bathing (including washing, rinsing, drying)?: A Little Help from another person to put on and taking off regular upper body clothing?: A Little Help from another person to put on and taking off regular lower body clothing?: A Little 6 Click Score: 19    End of Session Equipment Utilized During Treatment: Rolling walker (2 wheels);Other (comment) (AE)  OT Visit Diagnosis: Unsteadiness on feet (R26.81);Muscle weakness (generalized) (M62.81);Pain   Activity Tolerance Patient tolerated treatment well   Patient Left Other (comment) (EOB with daughter in law present and NT taking vitals)   Nurse Communication Mobility status        Time: 9260-9199 OT Time Calculation (min): 21 min  Charges: OT General Charges $OT Visit: 1 Visit OT Treatments $Self Care/Home Management : 8-22 mins  Diane Watkins, OTR/L [facility] Acute Rehabilitation Office: [phone]   Diane Watkins 02/10/2024, 8:08 AM

## 2024-02-10 NOTE — Progress Notes (Signed)
 Subjective: Procedure(s) (LRB): TRANSFORAMINAL LUMBAR INTERBODY FUSION (TLIF) WITH PEDICLE SCREW FIXATION 1 LEVEL (N/A) 2 Days Post-Op  Patient reports pain as moderate.  Reports decreased leg pain reports incisional back pain     Objective: Vital signs in last 24 hours: Temp:  [97.8 F (36.6 C)-98.6 F (37 C)] 97.8 F (36.6 C) (08/27 0350) Pulse Rate:  [76-107] 90 (08/27 0350) Resp:  [16-18] 18 (08/27 0350) BP: (111-145)/(47-79) 130/62 (08/27 0350) SpO2:  [98 %-100 %] 99 % (08/27 0350)  Intake/Output from previous day: No intake/output data recorded.  Labs: Recent Labs    02/09/24 0937  WBC 13.3*  RBC 3.63*  HCT 33.8*  PLT 272   Recent Labs    02/09/24 0937  NA 134*  K 4.1  CL 102  CO2 22  BUN 15  CREATININE 0.95  GLUCOSE 228*  CALCIUM  8.9   No results for input(s): LABPT, INR in the last 72 hours.  Physical Exam:  There is no height or weight on file to calculate BMI.  Dressing C/D/I  Sensation     Right      Left  L3   Intact     Intact L4   Intact     Intact L5    Intact     Intact S1   Intact     Intact    Motor Exam     Right     Left Iliopsoas  5/5     5/5  Quad   5/5     5/5  Hamstring  5/5     5/5  Tibials Anterior 5/5     5/5  EHL   5/5     5/5  Gastrocs  5/5     5/5      Assessment/Plan: Patient continues to state she is having increased pain in her back this morning.  Patient was able to ambulate without pain.  She is was able to ambulate without difficulty.  However, she felt increasing pain throughout the end of the day related to the incision.  She also reports a headache.  This is actually better when standing or walking and or painful as she lays down.  It is more occipital in nature.  I do not think this is related to a spinal headache.  There was no CSF leak during the surgery and her headache is not improved with lying down and factors better with standing.  At this point we will continue to have her work with physical  therapy.  Plan pain, expect she will likely need another getting into the hospital but we will see how she does.  She discharged to home with home health when she is cleared therapy and her pain is relatively well-controlled.  Her prescriptions have been sent from my office.  Patient stable  Continue mobilization with physical therapy Continue care  Up with therapy Plan for discharge tomorrow  Diane Rhein, MD, MS [facility] [phone]

## 2024-02-10 NOTE — Plan of Care (Signed)
 Pt doing well. Pt and family given D/C instructions with verbal understanding. Rx's were sent to the pharmacy by MD. Pt's incision is clean and dry with no sign of infection. Pt's IV was removed prior to D/C. Pt D/C'd home via wheelchair per MD order. Pt is stable @ D/C and has on other needs at this time. Pt received RW from [employer] per MD order.

## 2024-02-10 NOTE — Discharge Summary (Signed)
 Physician Discharge Summary  Patient ID: Diane Watkins MRN: [id number] DOB/AGE: 1951/05/07 73 y.o.  Admit date: 02/08/2024 Discharge date: 02/10/2024  Admission Diagnoses:  Discharge Diagnoses:  Principal Problem:   S/P lumbar fusion   Discharged Condition: good  Hospital Course: The patient is a 73 year old woman with a history of significant neurogenic claudication; spinal stenosis and spondylolisthesis.  She was admitted to the hospital 02/08/2024.  Taken to the operating the same day.  Please see the operative note for more details on the surgical patient procedure.  Following surgery she was transferred to the recovery room and then to the floor.  She was given teds and SCDs for DVT prophylaxis.  She was placed on the bowel regimen.  Her pain was controlled with oral pain IV agents.  She was seen by physical and Occupational Therapy.  She was cleared for discharge once her pain was controlled and she was cleared for therapy.  Consults: None  Significant Diagnostic Studies:   Treatments: procedures: Laminectomy and fusion L4-5  Discharge Exam: Blood pressure 130/62, pulse 90, temperature 97.8 F (36.6 C), temperature source Oral, resp. rate 18, SpO2 99%. Neurologic: Sensory: normal superficial pain L-2, L-3, L-4, and L-5 bilaterally Motor: grossly normal Incision/Wound: Clean and dry  Disposition:   Discharge Instructions     Incentive spirometry RT   Complete by: As directed       DISCHARGE INSTRUCTIONS LUMBAR FUSION      INCISION   Please make sure your incisions are checked at least twice daily for signs and symptoms of infection: If any of the below should occur, please call the office. Drainage from incisional site Opening of incisions Fevers greater than 101.3 Flu-like symptoms Increased redness and/or tenderness  If you have staples or sutures (not tape) in your incision they may be removed 2 weeks following your surgery.  This may be done by a visiting  nurse, family physician or by making an appointment to come into the office.   SHOWERING You may shower as normal once the large, bulky bandages are removed from your incisions.  If they are not removed before your discharge from the hospital, you may remove them 2 days after your surgery.         Hair washing is permissible while in the shower.  No tub baths, hot tubs or whirlpools until you are seen in the office.    EXERCISE   Lift objects weighing less than 10-15 lbs      Do not bend or twist at the waist.  Always bend your knees!!      Limit your sitting to 20-30 minute intervals.  You should lie down or walk in between sitting periods.          There are no limitations for sitting in a recliner chair.      Walk as much as possible-let discomfort be your guide.  You may also go up and down stairs as much as you can tolerate.        Walking outside (as long as it is nice weather) or walking on a treadmill is permitted (no incline).  BONE STIMULATOR A bone stimulator is a brace that emits an electrical signal to help with bone fusion.  If this was recommended to you, a representative will contact you once you are home to arrange delivery.  This should be worn as instructed.  It is usually used for 3 months after surgery.  PAIN   Take pain medication as prescribed.  As your pain level decreases, you may begin to take over-the-counter Extra Strength Tylenol .     DO NOT take any anti-inflammatories (like Motrin, Advil, etc.) for 10 weeks after surgery.       Taking anti-inflammatory medications can interfere with fusion healing.      Do not resume taking Fosamax or other osteoporosis medications for 12 weeks after your fusion surgery.  DRIVING  You may not drive a car until told otherwise by your physician.   You may be a passenger for short distances (20-30 minutes).  If you must take a longer trip, make sure to make several stops so that you can walk around and stretch your legs.   Reclining the passenger seat seems to be the most comfortable position for most patients.         FOLLOW-UP APPOINTMENTS An appointment should have been made for you already.  If not, or if you have any other questions, please.   [phone]  Cordella Rhein, MD, MS [facility] [phone]        Signed: [name] 02/10/2024, 7:23 AM

## 2024-02-11 ENCOUNTER — Telehealth: Payer: Self-pay

## 2024-02-11 ENCOUNTER — Encounter (HOSPITAL_COMMUNITY): Payer: Self-pay

## 2024-02-11 NOTE — Transitions of Care (Post Inpatient/ED Visit) (Signed)
   02/11/2024  Name: Diane Watkins MRN: [id number] DOB: [birth year]  Today's TOC FU Call Status: Today's TOC FU Call Status:: Unsuccessful Call (1st Attempt) Unsuccessful Call (1st Attempt) Date: 02/11/24  Attempted to reach the patient regarding the most recent Inpatient/ED visit.  Follow Up Plan: Additional outreach attempts will be made to reach the patient to complete the Transitions of Care (Post Inpatient/ED visit) call.   Alan Ee, RN, BSN, CEN [employer]- Transition of Care Team.  [facility] [phone]

## 2024-02-12 ENCOUNTER — Telehealth: Payer: Self-pay | Admitting: *Deleted

## 2024-02-12 NOTE — Transitions of Care (Post Inpatient/ED Visit) (Signed)
   02/12/2024  Name: IVONE LICHT MRN: [id number] DOB: [birth year]  Today's TOC FU Call Status: Today's TOC FU Call Status:: Unsuccessful Call (2nd Attempt) Unsuccessful Call (2nd Attempt) Date: 02/12/24  Attempted to reach the patient regarding the most recent Inpatient/ED visit.  Follow Up Plan: Additional outreach attempts will be made to reach the patient to complete the Transitions of Care (Post Inpatient/ED visit) call.   Mliss Creed [facility], BSN RN Care Manager/ Transition of Care [place] [phone]

## 2024-02-14 ENCOUNTER — Inpatient Hospital Stay (HOSPITAL_COMMUNITY)
Admission: EM | Admit: 2024-02-14 | Discharge: 2024-02-24 | DRG: 028 | Disposition: A | Discharge status: Rehabilitation Facility | Attending: Internal Medicine | Admitting: Internal Medicine

## 2024-02-14 ENCOUNTER — Encounter (HOSPITAL_COMMUNITY): Payer: Self-pay

## 2024-02-14 ENCOUNTER — Emergency Department (HOSPITAL_COMMUNITY)

## 2024-02-14 DIAGNOSIS — I808 Phlebitis and thrombophlebitis of other sites: Secondary | ICD-10-CM | POA: Diagnosis not present

## 2024-02-14 DIAGNOSIS — Z9842 Cataract extraction status, left eye: Secondary | ICD-10-CM

## 2024-02-14 DIAGNOSIS — Z880 Allergy status to penicillin: Secondary | ICD-10-CM

## 2024-02-14 DIAGNOSIS — R291 Meningismus: Secondary | ICD-10-CM | POA: Diagnosis not present

## 2024-02-14 DIAGNOSIS — R109 Unspecified abdominal pain: Secondary | ICD-10-CM | POA: Diagnosis not present

## 2024-02-14 DIAGNOSIS — K5900 Constipation, unspecified: Secondary | ICD-10-CM | POA: Diagnosis present

## 2024-02-14 DIAGNOSIS — G9609 Other spinal cerebrospinal fluid leak: Secondary | ICD-10-CM | POA: Diagnosis not present

## 2024-02-14 DIAGNOSIS — R509 Fever, unspecified: Secondary | ICD-10-CM | POA: Diagnosis not present

## 2024-02-14 DIAGNOSIS — G038 Meningitis due to other specified causes: Secondary | ICD-10-CM | POA: Diagnosis present

## 2024-02-14 DIAGNOSIS — D638 Anemia in other chronic diseases classified elsewhere: Secondary | ICD-10-CM | POA: Diagnosis present

## 2024-02-14 DIAGNOSIS — R519 Headache, unspecified: Secondary | ICD-10-CM | POA: Diagnosis not present

## 2024-02-14 DIAGNOSIS — E119 Type 2 diabetes mellitus without complications: Secondary | ICD-10-CM | POA: Diagnosis not present

## 2024-02-14 DIAGNOSIS — G9600 Cerebrospinal fluid leak, unspecified: Secondary | ICD-10-CM | POA: Diagnosis not present

## 2024-02-14 DIAGNOSIS — Z9049 Acquired absence of other specified parts of digestive tract: Secondary | ICD-10-CM

## 2024-02-14 DIAGNOSIS — T791XXA Fat embolism (traumatic), initial encounter: Secondary | ICD-10-CM

## 2024-02-14 DIAGNOSIS — M4316 Spondylolisthesis, lumbar region: Secondary | ICD-10-CM | POA: Diagnosis not present

## 2024-02-14 DIAGNOSIS — F419 Anxiety disorder, unspecified: Secondary | ICD-10-CM | POA: Diagnosis present

## 2024-02-14 DIAGNOSIS — G250 Essential tremor: Secondary | ICD-10-CM | POA: Diagnosis present

## 2024-02-14 DIAGNOSIS — R000 Tachycardia, unspecified: Secondary | ICD-10-CM | POA: Diagnosis present

## 2024-02-14 DIAGNOSIS — B9689 Other specified bacterial agents as the cause of diseases classified elsewhere: Secondary | ICD-10-CM | POA: Diagnosis present

## 2024-02-14 DIAGNOSIS — M160 Bilateral primary osteoarthritis of hip: Secondary | ICD-10-CM | POA: Diagnosis not present

## 2024-02-14 DIAGNOSIS — K219 Gastro-esophageal reflux disease without esophagitis: Secondary | ICD-10-CM | POA: Diagnosis not present

## 2024-02-14 DIAGNOSIS — S76311A Strain of muscle, fascia and tendon of the posterior muscle group at thigh level, right thigh, initial encounter: Secondary | ICD-10-CM | POA: Diagnosis not present

## 2024-02-14 DIAGNOSIS — Z79899 Other long term (current) drug therapy: Secondary | ICD-10-CM

## 2024-02-14 DIAGNOSIS — Z9889 Other specified postprocedural states: Secondary | ICD-10-CM | POA: Diagnosis not present

## 2024-02-14 DIAGNOSIS — S76011A Strain of muscle, fascia and tendon of right hip, initial encounter: Secondary | ICD-10-CM | POA: Diagnosis not present

## 2024-02-14 DIAGNOSIS — E7800 Pure hypercholesterolemia, unspecified: Secondary | ICD-10-CM | POA: Diagnosis present

## 2024-02-14 DIAGNOSIS — I10 Essential (primary) hypertension: Secondary | ICD-10-CM | POA: Diagnosis present

## 2024-02-14 DIAGNOSIS — D72829 Elevated white blood cell count, unspecified: Secondary | ICD-10-CM | POA: Diagnosis not present

## 2024-02-14 DIAGNOSIS — B9629 Other Escherichia coli [E. coli] as the cause of diseases classified elsewhere: Secondary | ICD-10-CM | POA: Diagnosis not present

## 2024-02-14 DIAGNOSIS — R7881 Bacteremia: Secondary | ICD-10-CM | POA: Diagnosis not present

## 2024-02-14 DIAGNOSIS — Y838 Other surgical procedures as the cause of abnormal reaction of the patient, or of later complication, without mention of misadventure at the time of the procedure: Secondary | ICD-10-CM | POA: Diagnosis present

## 2024-02-14 DIAGNOSIS — Z8249 Family history of ischemic heart disease and other diseases of the circulatory system: Secondary | ICD-10-CM

## 2024-02-14 DIAGNOSIS — Z811 Family history of alcohol abuse and dependence: Secondary | ICD-10-CM

## 2024-02-14 DIAGNOSIS — M549 Dorsalgia, unspecified: Secondary | ICD-10-CM | POA: Diagnosis present

## 2024-02-14 DIAGNOSIS — Z981 Arthrodesis status: Secondary | ICD-10-CM | POA: Diagnosis not present

## 2024-02-14 DIAGNOSIS — Z961 Presence of intraocular lens: Secondary | ICD-10-CM | POA: Diagnosis present

## 2024-02-14 DIAGNOSIS — Z6837 Body mass index (BMI) 37.0-37.9, adult: Secondary | ICD-10-CM

## 2024-02-14 DIAGNOSIS — Y844 Aspiration of fluid as the cause of abnormal reaction of the patient, or of later complication, without mention of misadventure at the time of the procedure: Secondary | ICD-10-CM | POA: Diagnosis present

## 2024-02-14 DIAGNOSIS — K529 Noninfective gastroenteritis and colitis, unspecified: Secondary | ICD-10-CM | POA: Diagnosis not present

## 2024-02-14 DIAGNOSIS — Z823 Family history of stroke: Secondary | ICD-10-CM

## 2024-02-14 DIAGNOSIS — R7401 Elevation of levels of liver transaminase levels: Secondary | ICD-10-CM | POA: Diagnosis not present

## 2024-02-14 DIAGNOSIS — Z885 Allergy status to narcotic agent status: Secondary | ICD-10-CM

## 2024-02-14 DIAGNOSIS — E1165 Type 2 diabetes mellitus with hyperglycemia: Secondary | ICD-10-CM | POA: Diagnosis present

## 2024-02-14 DIAGNOSIS — E785 Hyperlipidemia, unspecified: Secondary | ICD-10-CM | POA: Diagnosis not present

## 2024-02-14 DIAGNOSIS — K429 Umbilical hernia without obstruction or gangrene: Secondary | ICD-10-CM | POA: Diagnosis not present

## 2024-02-14 DIAGNOSIS — E66812 Obesity, class 2: Secondary | ICD-10-CM | POA: Diagnosis present

## 2024-02-14 DIAGNOSIS — G9341 Metabolic encephalopathy: Secondary | ICD-10-CM | POA: Diagnosis present

## 2024-02-14 DIAGNOSIS — D62 Acute posthemorrhagic anemia: Secondary | ICD-10-CM | POA: Diagnosis present

## 2024-02-14 DIAGNOSIS — K5730 Diverticulosis of large intestine without perforation or abscess without bleeding: Secondary | ICD-10-CM | POA: Diagnosis not present

## 2024-02-14 DIAGNOSIS — M25551 Pain in right hip: Secondary | ICD-10-CM | POA: Diagnosis not present

## 2024-02-14 DIAGNOSIS — E538 Deficiency of other specified B group vitamins: Secondary | ICD-10-CM | POA: Diagnosis present

## 2024-02-14 LAB — CBC WITH DIFFERENTIAL/PLATELET
Abs Immature Granulocytes: 0.18 K/uL — ABNORMAL HIGH (ref 0.00–0.07)
Basophils Absolute: 0 K/uL (ref 0.0–0.1)
Basophils Relative: 0 %
Eosinophils Absolute: 0.1 K/uL (ref 0.0–0.5)
Eosinophils Relative: 1 %
HCT: 32.5 % — ABNORMAL LOW (ref 36.0–46.0)
Hemoglobin: 10.7 g/dL — ABNORMAL LOW (ref 12.0–15.0)
Immature Granulocytes: 2 %
Lymphocytes Relative: 15 %
Lymphs Abs: 1.6 K/uL (ref 0.7–4.0)
MCH: 31.1 pg (ref 26.0–34.0)
MCHC: 32.9 g/dL (ref 30.0–36.0)
MCV: 94.5 fL (ref 80.0–100.0)
Monocytes Absolute: 0.9 K/uL (ref 0.1–1.0)
Monocytes Relative: 8 %
Neutro Abs: 8.3 K/uL — ABNORMAL HIGH (ref 1.7–7.7)
Neutrophils Relative %: 74 %
Platelets: 317 K/uL (ref 150–400)
RBC: 3.44 MIL/uL — ABNORMAL LOW (ref 3.87–5.11)
RDW: 13.2 % (ref 11.5–15.5)
WBC: 11.1 K/uL — ABNORMAL HIGH (ref 4.0–10.5)
nRBC: 0 % (ref 0.0–0.2)

## 2024-02-14 LAB — COMPREHENSIVE METABOLIC PANEL WITH GFR
ALT: 30 U/L (ref 0–44)
AST: 24 U/L (ref 15–41)
Albumin: 2.8 g/dL — ABNORMAL LOW (ref 3.5–5.0)
Alkaline Phosphatase: 40 U/L (ref 38–126)
Anion gap: 12 (ref 5–15)
BUN: 18 mg/dL (ref 8–23)
CO2: 21 mmol/L — ABNORMAL LOW (ref 22–32)
Calcium: 8.3 mg/dL — ABNORMAL LOW (ref 8.9–10.3)
Chloride: 105 mmol/L (ref 98–111)
Creatinine, Ser: 1.1 mg/dL — ABNORMAL HIGH (ref 0.44–1.00)
GFR, Estimated: 54 mL/min — ABNORMAL LOW (ref >60)
Glucose, Bld: 110 mg/dL — ABNORMAL HIGH (ref 70–99)
Potassium: 4 mmol/L (ref 3.5–5.1)
Sodium: 138 mmol/L (ref 135–145)
Total Bilirubin: 0.8 mg/dL (ref 0.0–1.2)
Total Protein: 5.3 g/dL — ABNORMAL LOW (ref 6.5–8.1)

## 2024-02-14 LAB — GLUCOSE, CAPILLARY: Glucose-Capillary: 122 mg/dL — ABNORMAL HIGH (ref 70–99)

## 2024-02-14 MED ORDER — POLYETHYLENE GLYCOL 3350 17 G PO PACK
17.0000 g | PACK | Freq: Every day | ORAL | Status: DC | PRN
  Administered 2024-02-19: 17 g via ORAL
  Filled 2024-02-14: qty 1

## 2024-02-14 MED ORDER — HYDROMORPHONE HCL 1 MG/ML IJ SOLN
0.5000 mg | INTRAMUSCULAR | Status: DC | PRN
  Administered 2024-02-14 – 2024-02-15 (×4): 1 mg via INTRAVENOUS
  Filled 2024-02-14 (×4): qty 1

## 2024-02-14 MED ORDER — ACETAMINOPHEN 650 MG RE SUPP: 650.0000 mg | Freq: Four times a day (QID) | RECTAL | Status: DC | PRN

## 2024-02-14 MED ORDER — TRAMADOL HCL 50 MG PO TABS
50.0000 mg | ORAL_TABLET | Freq: Four times a day (QID) | ORAL | Status: DC | PRN
  Administered 2024-02-15 – 2024-02-20 (×5): 50 mg via ORAL
  Filled 2024-02-14 (×5): qty 1

## 2024-02-14 MED ORDER — CYCLOBENZAPRINE HCL 10 MG PO TABS
10.0000 mg | ORAL_TABLET | Freq: Three times a day (TID) | ORAL | Status: DC | PRN
  Administered 2024-02-14 – 2024-02-21 (×5): 10 mg via ORAL
  Filled 2024-02-14 (×6): qty 1

## 2024-02-14 MED ORDER — HYDROCODONE-ACETAMINOPHEN 5-325 MG PO TABS: 1.0000 | ORAL_TABLET | ORAL | Status: DC | PRN

## 2024-02-14 MED ORDER — ONDANSETRON HCL 4 MG/2ML IJ SOLN
4.0000 mg | Freq: Once | INTRAMUSCULAR | Status: AC
  Administered 2024-02-14: 4 mg via INTRAVENOUS
  Filled 2024-02-14: qty 2

## 2024-02-14 MED ORDER — INSULIN ASPART 100 UNIT/ML IJ SOLN
0.0000 [IU] | Freq: Three times a day (TID) | INTRAMUSCULAR | Status: DC
  Administered 2024-02-15: 3 [IU] via SUBCUTANEOUS
  Administered 2024-02-16 (×3): 2 [IU] via SUBCUTANEOUS
  Administered 2024-02-18 (×2): 3 [IU] via SUBCUTANEOUS
  Administered 2024-02-19: 2 [IU] via SUBCUTANEOUS
  Administered 2024-02-19 – 2024-02-20 (×3): 3 [IU] via SUBCUTANEOUS
  Administered 2024-02-20 – 2024-02-21 (×2): 2 [IU] via SUBCUTANEOUS
  Administered 2024-02-22: 3 [IU] via SUBCUTANEOUS
  Administered 2024-02-22 – 2024-02-24 (×3): 2 [IU] via SUBCUTANEOUS

## 2024-02-14 MED ORDER — ACETAMINOPHEN 325 MG PO TABS
650.0000 mg | ORAL_TABLET | Freq: Four times a day (QID) | ORAL | Status: DC | PRN
  Administered 2024-02-15 – 2024-02-23 (×8): 650 mg via ORAL
  Filled 2024-02-14 (×9): qty 2

## 2024-02-14 MED ORDER — SODIUM CHLORIDE 0.9% FLUSH
3.0000 mL | Freq: Two times a day (BID) | INTRAVENOUS | Status: DC
  Administered 2024-02-14 – 2024-02-16 (×6): 3 mL via INTRAVENOUS

## 2024-02-14 MED ORDER — OMEGA-3-ACID ETHYL ESTERS 1 G PO CAPS
2.0000 | ORAL_CAPSULE | Freq: Two times a day (BID) | ORAL | Status: DC
  Administered 2024-02-14 – 2024-02-24 (×16): 2 g via ORAL
  Filled 2024-02-14 (×17): qty 2

## 2024-02-14 MED ORDER — HYDROMORPHONE HCL 1 MG/ML IJ SOLN
1.0000 mg | Freq: Once | INTRAMUSCULAR | Status: AC
  Administered 2024-02-14: 1 mg via INTRAVENOUS
  Filled 2024-02-14: qty 1

## 2024-02-14 MED ORDER — ROSUVASTATIN CALCIUM 20 MG PO TABS
40.0000 mg | ORAL_TABLET | Freq: Every evening | ORAL | Status: DC
  Administered 2024-02-14 – 2024-02-23 (×9): 40 mg via ORAL
  Filled 2024-02-14 (×10): qty 2

## 2024-02-14 MED ORDER — LACTATED RINGERS IV BOLUS
1000.0000 mL | Freq: Once | INTRAVENOUS | Status: AC
  Administered 2024-02-14: 1000 mL via INTRAVENOUS

## 2024-02-14 MED ORDER — LISINOPRIL 20 MG PO TABS
20.0000 mg | ORAL_TABLET | Freq: Every evening | ORAL | Status: DC
Start: 2024-02-14 — End: 2024-02-24
  Administered 2024-02-14 – 2024-02-23 (×9): 20 mg via ORAL
  Filled 2024-02-14 (×11): qty 1

## 2024-02-14 NOTE — ED Provider Notes (Addendum)
 [place] EMERGENCY DEPARTMENT AT [facility] Provider Note   CSN: [id number] Arrival date & time: 02/14/24  1120     Patient presents with: Headache   Diane Watkins is a 73 y.o. female.   Patient status post spine surgery by Dr. Reyne [facility] on August 25.  Patient discharged 2 days later.  Did have a headache at the time.  Headache has persisted maybe got a little bit worse.  No overall change in any upper extremity or lower extremity problems.  No fevers.  No real neck pain.  Still has some lower back discomfort related to the surgery.  Main problem has been the headache.  Patient did not contact anybody from the group.  The procedure was right sided L4-L5 transforaminal lumbar interbody fusion with posterior fusion insertion of interbody device and use of a morselized allograft.  Patient's temp here is 97.6.  Heart rate 107 respirations 24 blood pressure 195/108.  Oxygen sats 100% on room air.  Past medical history in for high cholesterol hypertension hyperlipidemia diabetes without complications past surgical history includes gallbladder removal.       Prior to Admission medications   Medication Sig Start Date End Date Taking? Authorizing Provider  acetaminophen  (TYLENOL ) 325 MG tablet Take 650 mg by mouth every 6 (six) hours as needed (pain.).    [provider]  Capsaicin (ASPERCREME PAIN RELIEF PATCH EX) Place 1 patch onto the skin daily as needed (pain.).    [provider]  cetirizine (ZYRTEC) 10 MG tablet Take 10 mg by mouth daily as needed for allergies.    [provider]  cyclobenzaprine  (FLEXERIL ) 10 MG tablet Take 1 tablet (10 mg total) by mouth 3 (three) times daily as needed for muscle spasms. 02/10/24   Jeffery, Michael J, PA-C  ibuprofen (ADVIL) 200 MG tablet Take 600 mg by mouth daily as needed (pain.).    [provider]  lisinopril  (ZESTRIL ) 20 MG tablet Take 1 tablet (20 mg total) by  mouth daily. Patient taking differently: Take 20 mg by mouth every evening. 08/03/23   Cook, Jayce G, DO  omega-3 acid ethyl esters (LOVAZA ) 1 g capsule Take 2 capsules (2 g total) by mouth 2 (two) times daily. 08/03/23   Cook, Jayce G, DO  oxyCODONE -acetaminophen  (PERCOCET/ROXICET) 5-325 MG tablet Take 1-2 tablets by mouth every 4 (four) hours as needed for moderate pain (pain score 4-6) or severe pain (pain score 7-10) (1 tablet for moderate pain, 2 tablets for severe pain). 02/10/24   Jeffery, Michael J, PA-C  Polyethyl Glycol-Propyl Glycol 0.4-0.3 % SOLN Place 1-2 drops into both eyes 3 (three) times daily as needed (pain.).    [provider]  rosuvastatin  (CRESTOR ) 40 MG tablet Take 1 tablet (40 mg total) by mouth daily. Patient taking differently: Take 40 mg by mouth every evening. 08/03/23   Cook, Jayce G, DO  Trolamine Salicylate (ARTHRITIS PAIN MEDICINE EX) Apply 1 Application topically 3 (three) times daily as needed (pain.).    [provider]    Allergies: Amoxil [amoxicillin]    Review of Systems  Constitutional:  Negative for chills and fever.  HENT:  Negative for ear pain and sore throat.   Eyes:  Negative for photophobia, pain and visual disturbance.  Respiratory:  Negative for cough and shortness of breath.   Cardiovascular:  Negative for chest pain and palpitations.  Gastrointestinal:  Negative for abdominal pain and vomiting.  Genitourinary:  Negative for dysuria and hematuria.  Musculoskeletal:  Negative for arthralgias and back pain.  Skin:  Negative for color change and rash.  Neurological:  Positive for headaches. Negative for seizures and syncope.  All other systems reviewed and are negative.   Updated Vital Signs BP (!) 153/82   Pulse (!) 102   Temp 97.6 F (36.4 C) (Oral)   Resp 14   Ht 1.575 m (5' 2)   Wt 93 kg   SpO2 92%   BMI 37.49 kg/m   Physical Exam Vitals and nursing note reviewed.  Constitutional:      General: She is not in  acute distress.    Appearance: Normal appearance. She is well-developed. She is not ill-appearing.  HENT:     Head: Normocephalic and atraumatic.     Mouth/Throat:     Mouth: Mucous membranes are moist.  Eyes:     Extraocular Movements: Extraocular movements intact.     Conjunctiva/sclera: Conjunctivae normal.     Pupils: Pupils are equal, round, and reactive to light.  Cardiovascular:     Rate and Rhythm: Normal rate and regular rhythm.     Heart sounds: No murmur heard. Pulmonary:     Effort: Pulmonary effort is normal. No respiratory distress.     Breath sounds: Normal breath sounds.  Abdominal:     Palpations: Abdomen is soft.     Tenderness: There is no abdominal tenderness.  Musculoskeletal:        General: No swelling.     Cervical back: Neck supple. No rigidity.     Comments: Wound does not seem to have any particular complications no leakage or drainage  Skin:    General: Skin is warm and dry.     Capillary Refill: Capillary refill takes less than 2 seconds.  Neurological:     General: No focal deficit present.     Mental Status: She is alert and oriented to person, place, and time.     Cranial Nerves: No cranial nerve deficit.     Sensory: No sensory deficit.     Motor: No weakness.  Psychiatric:        Mood and Affect: Mood normal.     (all labs ordered are listed, but only abnormal results are displayed) Labs Reviewed  CBC WITH DIFFERENTIAL/PLATELET  COMPREHENSIVE METABOLIC PANEL WITH GFR  CBC WITH DIFFERENTIAL/PLATELET    EKG: None  Radiology: CT Head Wo Contrast Addendum Date: 02/14/2024 ADDENDUM REPORT: 02/14/2024 13:12 ADDENDUM: Study discussed by telephone with Dr. Brixton Schnapp on 02/14/2024 at 1300 hours. Electronically Signed   By: VEAR Hurst M.D.   On: 02/14/2024 13:12   Result Date: 02/14/2024 CLINICAL DATA:  73 year old female with extreme headache. Recent spine surgery. EXAM: CT HEAD WITHOUT CONTRAST TECHNIQUE: Contiguous axial images were  obtained from the base of the skull through the vertex without intravenous contrast. RADIATION DOSE REDUCTION: This exam was performed according to the departmental dose-optimization program which includes automated exposure control, adjustment of the mA and/or kV according to patient size and/or use of iterative reconstruction technique. COMPARISON:  Head CT 03/14/2019. FINDINGS: Brain: Basilar cisterns appear patent, but there is abnormal lipomatous density (-60 Hounsfield units) in the suprasellar and interpeduncular cisterns, new since 2020 (sagittal image 28). Occasional punctate lipomatous density identified elsewhere in the posterior fossa (series 3, images 5, 10, 11). And small CSF density sub tentorial posterior fossa effusion is new (coronal image 24). But no significant brainstem mass effect. Fourth ventricle size within normal limits. No ventriculomegaly. No hyperdense intracranial hemorrhage  is identified. No intracranial midline shift or mass effect. Maintained gray-white differentiation. No cortically based acute infarct identified. Vascular: No suspicious intracranial vascular hyperdensity. Skull: Appears intact.  No acute osseous abnormality identified. Sinuses/Orbits: Visualized paranasal sinuses and mastoids are clear. Other: Stable orbit and scalp soft tissues. IMPRESSION: 1. Abnormal intracranial lipid material, new since 2020 and most concentrated in the suprasellar and interpeduncular cisterns, but tiny foci elsewhere in the posterior fossa. Superimposed small new CSF density sub-tentorial posterior fossa effusions also. 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 2. No other complicating features; no significant intracranial mass effect, no ventriculomegaly, no acute intracranial hemorrhage or infarct identified. Electronically Signed: By: VEAR Hurst M.D. On: 02/14/2024 12:59     Procedures   Medications Ordered in the ED  HYDROmorphone  (DILAUDID ) injection 1 mg (1 mg Intravenous Given  02/14/24 1249)  ondansetron  (ZOFRAN ) injection 4 mg (4 mg Intravenous Given 02/14/24 1249)                                    Medical Decision Making Amount and/or Complexity of Data Reviewed Labs: ordered. Radiology: ordered.  Risk Prescription drug management. Decision regarding hospitalization.   CT head with unusual but interesting findings.  Abnormal intracranial lipid material new since 2020 and most concentrated in the suprasellar the interpedicular cyst turns but tiny foci elsewhere in the posterior fossa superimposed small new CSF density subtentorial possible fossa effusions considered lip lipoid meningitis in the setting.  Or fat embolus.  No other findings.  Nothing to suggest CSF leakage.  As per discussion with the radiology.  Discussed with Dr. Celena covering for [facility] today and he is going to contact Dr. Reyne.  Will get back to me.  Dr. Jamey did talk to Dr. Reyne.  This can be symptomatic treatment.  Patient really struggling from the head pain.  We decided to do with medicine admission to make sure her pain is under better control.  And that she is doing well.  Will contact hospitalist for admission she is followed by [facility].  Final diagnoses:  Bad headache  Fat embolism, initial encounter [facility])    ED Discharge Orders     None          Geraldene Hamilton, MD 02/14/24 1339    Geraldene Hamilton, MD 02/14/24 [phone]

## 2024-02-14 NOTE — ED Triage Notes (Signed)
 According to [facility]: Pt found alert and oriented. Pt has been complaining headache all over head for last six days since she had back surgery. Pt called DR gebar to bring her in incase she has a spinal leak.  HX of borderline diabetes.  Vitals 170/98 94 Spo2 on room air RR 20 HR 106 98.7 f temp 168 cbg.

## 2024-02-14 NOTE — Plan of Care (Signed)

## 2024-02-14 NOTE — H&P (Signed)
 History and Physical   Diane Watkins FMW:[id number] DOB: 04/17/1951 DOA: 02/14/2024  PCP: Cook, Jayce G, DO   Patient coming from: Home  Chief Complaint: Headache  HPI: Diane Watkins is a 73 y.o. female with medical history significant of hypertension, hyperlipidemia, diabetes, GERD, lumbar fusion presenting with headache.  Patient was recently discharged on 8/27 after undergoing a transforaminal lumbar fusion of L4-5 on 8/25.  Patient did have headache on discharge that was fairly severe which has persisted and may be gotten a little worse.  She was discharged with oxycodone  but had a rash with this to stop taking it.  Has not yet contacted her orthopedic surgeon.  Denies fevers, chills, chest pain, shortness of breath, abdominal pain, constipation, diarrhea, nausea, vomiting.   ED Course: Vital signs in the ED notable for blood pressure in the 130s-140 systolic, heart rate in the 100s.  Lab workup included CMP with bicarb 20, creatinine 1.1 which is near baseline 0.9, glucose 110, calcium  8.3, protein 5.3, albumin  2.8.  CBC with leukocytosis to 11.1 improved from 13 5 days ago, hemoglobin 10.7 which is near 11.55 days ago but down from 1311 days ago.  CT head showed abnormal intracranial lipid material new from 2020 and is concentrated at the suprasellar and interpedicular cisterns.  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  Patient received Dilaudid  and Zofran  in the ED.  Orthopedic surgery consulted and on-call provider spoke with orthospine surgeon in their group who performed this procedure.  They recommended symptomatic management and ultimately decision was made to observe patient for further complication and to ensure pain regimen is effective given she was discharged on oxycodone  but had a rash and reaction to this.  Has tolerated Dilaudid  in the ED.  Review of Systems: As per HPI otherwise all other systems  reviewed and are negative.  Past Medical History:  Diagnosis Date   Arthritis    Diabetes mellitus without complication (HCC)    High cholesterol    History of hiatal hernia    Hyperlipidemia    Hypertension     Past Surgical History:  Procedure Laterality Date   BIOPSY  07/24/2021   Procedure: BIOPSY;  Surgeon: Golda Claudis PENNER, MD;  Location: [facility];  Service: Endoscopy;;  polyp   CATARACT EXTRACTION W/PHACO Left 03/29/2018   Procedure: CATARACT EXTRACTION PHACO AND INTRAOCULAR LENS PLACEMENT (IOC);  Surgeon: Perley Hamilton, MD;  Location: [facility];  Service: Ophthalmology;  Laterality: Left;  CDE: 10.13   CHOLECYSTECTOMY     COLONOSCOPY WITH PROPOFOL  N/A 07/24/2021   Procedure: COLONOSCOPY WITH PROPOFOL ;  Surgeon: Golda Claudis PENNER, MD;  Location: [facility];  Service: Endoscopy;  Laterality: N/A;   ESOPHAGOGASTRODUODENOSCOPY (EGD) WITH PROPOFOL  N/A 07/24/2021   Procedure: ESOPHAGOGASTRODUODENOSCOPY (EGD) WITH PROPOFOL ;  Surgeon: Golda Claudis PENNER, MD;  Location: [facility];  Service: Endoscopy;  Laterality: N/A;   INCISIONAL HERNIA REPAIR N/A 09/04/2021   Procedure: HERNIA REPAIR INCISIONAL W/MESH;  Surgeon: Mavis Anes, MD;  Location: [facility];  Service: General;  Laterality: N/A;   POLYPECTOMY  07/24/2021   Procedure: POLYPECTOMY;  Surgeon: Golda Claudis PENNER, MD;  Location: [facility];  Service: Endoscopy;;   TRANSFORAMINAL LUMBAR INTERBODY FUSION (TLIF) WITH PEDICLE SCREW FIXATION 1 LEVEL N/A 02/08/2024   Procedure: TRANSFORAMINAL LUMBAR INTERBODY FUSION (TLIF) WITH PEDICLE SCREW FIXATION 1 LEVEL;  Surgeon: Reyne Cordella SQUIBB, MD;  Location: [facility];  Service: Orthopedics;  Laterality: N/A;  Transforaminal lumber interbody fusion with pedicle screw fixation, lumbar 4-lumbar 5   TUBAL LIGATION      Social History  reports that she has never smoked. She has been exposed to tobacco smoke. She has never used smokeless tobacco. She reports that she does not currently use alcohol . She  reports that she does not use drugs.  Allergies  Allergen Reactions   Amoxil [Amoxicillin]     Bad headaches Has patient had a PCN reaction causing immediate rash, facial/tongue/throat swelling, SOB or lightheadedness with hypotension: No Has patient had a PCN reaction causing severe rash involving mucus membranes or skin necrosis: No Has patient had a PCN reaction that required hospitalization: No Has patient had a PCN reaction occurring within the last 10 years: No If all of the above answers are NO, then may proceed with Cephalosporin use.     Family History  Problem Relation Age of Onset   Stroke Mother    Alcohol  abuse Father    Heart disease Father 61       MI  Reviewed on admission  Prior to Admission medications   Medication Sig Start Date End Date Taking? Authorizing Provider  acetaminophen  (TYLENOL ) 325 MG tablet Take 650 mg by mouth every 6 (six) hours as needed (pain.).    [provider]  Capsaicin (ASPERCREME PAIN RELIEF PATCH EX) Place 1 patch onto the skin daily as needed (pain.).    [provider]  cetirizine (ZYRTEC) 10 MG tablet Take 10 mg by mouth daily as needed for allergies.    [provider]  cyclobenzaprine  (FLEXERIL ) 10 MG tablet Take 1 tablet (10 mg total) by mouth 3 (three) times daily as needed for muscle spasms. 02/10/24   Jeffery, Michael J, PA-C  ibuprofen (ADVIL) 200 MG tablet Take 600 mg by mouth daily as needed (pain.).    [provider]  lisinopril  (ZESTRIL ) 20 MG tablet Take 1 tablet (20 mg total) by mouth daily. Patient taking differently: Take 20 mg by mouth every evening. 08/03/23   Cook, Jayce G, DO  omega-3 acid ethyl esters (LOVAZA ) 1 g capsule Take 2 capsules (2 g total) by mouth 2 (two) times daily. 08/03/23   Cook, Jayce G, DO  oxyCODONE -acetaminophen  (PERCOCET/ROXICET) 5-325 MG tablet Take 1-2 tablets by mouth every 4 (four) hours as needed for moderate pain (pain score 4-6) or severe pain (pain score  7-10) (1 tablet for moderate pain, 2 tablets for severe pain). 02/10/24   Jeffery, Michael J, PA-C  Polyethyl Glycol-Propyl Glycol 0.4-0.3 % SOLN Place 1-2 drops into both eyes 3 (three) times daily as needed (pain.).    [provider]  rosuvastatin  (CRESTOR ) 40 MG tablet Take 1 tablet (40 mg total) by mouth daily. Patient taking differently: Take 40 mg by mouth every evening. 08/03/23   Cook, Jayce G, DO  Trolamine Salicylate (ARTHRITIS PAIN MEDICINE EX) Apply 1 Application topically 3 (three) times daily as needed (pain.).    [provider]    Physical Exam: Vitals:   02/14/24 1315 02/14/24 1330 02/14/24 1345 02/14/24 1430  BP: (!) 153/82 138/85 (!) 169/83 (!) 146/80  Pulse: (!) 102 (!) 103 (!) 105 (!) 104  Resp: 14 12 15 15   Temp:      TempSrc:      SpO2: 92% 96% 100% 92%  Weight:      Height:        Physical Exam Constitutional:      General: She is not in acute distress.  Appearance: Normal appearance.  HENT:     Head: Normocephalic and atraumatic.     Mouth/Throat:     Mouth: Mucous membranes are moist.     Pharynx: Oropharynx is clear.  Eyes:     Extraocular Movements: Extraocular movements intact.     Pupils: Pupils are equal, round, and reactive to light.  Cardiovascular:     Rate and Rhythm: Regular rhythm. Tachycardia present.     Pulses: Normal pulses.     Heart sounds: Normal heart sounds.  Pulmonary:     Effort: Pulmonary effort is normal. No respiratory distress.     Breath sounds: Normal breath sounds.  Abdominal:     General: Bowel sounds are normal. There is no distension.     Palpations: Abdomen is soft.     Tenderness: There is no abdominal tenderness.  Musculoskeletal:        General: No swelling or deformity.  Skin:    General: Skin is warm and dry.  Neurological:     General: No focal deficit present.     Mental Status: Mental status is at baseline.    Labs on Admission: I have personally reviewed following labs and  imaging studies  CBC: Recent Labs  Lab 02/09/24 0937 02/14/24 1345  WBC 13.3* 11.1*  NEUTROABS  --  8.3*  HGB 11.5* 10.7*  HCT 33.8* 32.5*  MCV 93.1 94.5  PLT 272 317    Basic Metabolic Panel: Recent Labs  Lab 02/09/24 0937 02/14/24 1207  NA 134* 138  K 4.1 4.0  CL 102 105  CO2 22 21*  GLUCOSE 228* 110*  BUN 15 18  CREATININE 0.95 1.10*  CALCIUM  8.9 8.3*    GFR: Estimated Creatinine Clearance: 50.6 mL/min (A) (by C-G formula based on SCr of 1.1 mg/dL (H)).  Liver Function Tests: Recent Labs  Lab 02/14/24 1207  AST 24  ALT 30  ALKPHOS 40  BILITOT 0.8  PROT 5.3*  ALBUMIN  2.8*    Urine analysis:    Component Value Date/Time   COLORURINE YELLOW 09/27/2020 1920   APPEARANCEUR CLEAR 09/27/2020 1920   LABSPEC 1.024 09/27/2020 1920   PHURINE 5.5 09/27/2020 1920   GLUCOSEU NEGATIVE 09/27/2020 1920   HGBUR MODERATE (A) 09/27/2020 1920   BILIRUBINUR NEGATIVE 09/27/2020 1920   KETONESUR 40 (A) 09/27/2020 1920   PROTEINUR TRACE (A) 09/27/2020 1920   NITRITE NEGATIVE 09/27/2020 1920   LEUKOCYTESUR LARGE (A) 09/27/2020 1920    Radiological Exams on Admission: CT Head Wo Contrast Addendum Date: 02/14/2024 ADDENDUM REPORT: 02/14/2024 13:12 ADDENDUM: Study discussed by telephone with Dr. SCOTT ZACKOWSKI on 02/14/2024 at 1300 hours. Electronically Signed   By: VEAR Hurst M.D.   On: 02/14/2024 13:12   Result Date: 02/14/2024 CLINICAL DATA:  73 year old female with extreme headache. Recent spine surgery. EXAM: CT HEAD WITHOUT CONTRAST TECHNIQUE: Contiguous axial images were obtained from the base of the skull through the vertex without intravenous contrast. RADIATION DOSE REDUCTION: This exam was performed according to the departmental dose-optimization program which includes automated exposure control, adjustment of the mA and/or kV according to patient size and/or use of iterative reconstruction technique. COMPARISON:  Head CT 03/14/2019. FINDINGS: Brain: Basilar cisterns  appear patent, but there is abnormal lipomatous density (-60 Hounsfield units) in the suprasellar and interpeduncular cisterns, new since 2020 (sagittal image 28). Occasional punctate lipomatous density identified elsewhere in the posterior fossa (series 3, images 5, 10, 11). And small CSF density sub tentorial posterior fossa effusion is new (coronal image 24).  But no significant brainstem mass effect. Fourth ventricle size within normal limits. No ventriculomegaly. No hyperdense intracranial hemorrhage is identified. No intracranial midline shift or mass effect. Maintained gray-white differentiation. No cortically based acute infarct identified. Vascular: No suspicious intracranial vascular hyperdensity. Skull: Appears intact.  No acute osseous abnormality identified. Sinuses/Orbits: Visualized paranasal sinuses and mastoids are clear. Other: Stable orbit and scalp soft tissues. IMPRESSION: 1. Abnormal intracranial lipid material, new since 2020 and most concentrated in the suprasellar and interpeduncular cisterns, but tiny foci elsewhere in the posterior fossa. Superimposed small new CSF density sub-tentorial posterior fossa effusions also. 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 2. No other complicating features; no significant intracranial mass effect, no ventriculomegaly, no acute intracranial hemorrhage or infarct identified. Electronically Signed: By: VEAR Hurst M.D. On: 02/14/2024 12:59   EKG: Not performed in the emergency department.  Assessment/Plan Principal Problem:   Meningitis-like reaction Active Problems:   Essential hypertension, benign   Hyperlipidemia   GERD (gastroesophageal reflux disease)   Type 2 diabetes mellitus without complications (HCC)   S/P lumbar fusion   Headache   Headache Meningitis like reaction > Severe headache following LP 4-5 fusion surgery on 8/25. 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  Can have complications of hydrocephalus but none currently. > On-call orthopedic spoke with orthospine surgeon who performed procedure who recommended symptomatic management.  Observing for pain control and to ensure patient is on appropriate regimen for outpatient as she was discharged on oxycodone  and had significant itching after taking this which was followed by a rash secondary to scratching. > Discussed with patient that we will try tramadol  which she has had in the past and if this does not work we can try Norco.  The Norco is in the same class as oxycodone  she has not had a reaction to Dilaudid . - Monitor on telemetry overnight - Appreciate orthopedic surgery recommendations and assistance - Tylenol  for mild pain, Tramadol  for moderate to severe pain, Dilaudid  for severe breakthrough pain - Continue with Flexeril  as needed - Supportive care  Leukocytosis Anemia > Leukocytosis 11.1, this is improved from 13 5 days ago which was postop.  Hemoglobin 10.7 which is near 11.55 days ago which was postop.  Hemoglobin was 13 eleven days ago, likely represents some blood loss operatively.  > Leukocytosis is improving and is likely reactive.  Anemia is largely stable will trend. - Trend CBC  Creatinine elevation > Creatinine 1.1 near baseline of 0.9, not quite true AKI. - 1 L IV fluids - Trend renal function and electrolytes  Hypertension - Continue lisinopril   Hyperlipidemia - Continue home atorvastatin , Lovaza   Diabetes - SSI  DVT prophylaxis: SCDs for now Code Status:   Full Family Communication:  None on admission  Disposition Plan:   Patient is from:  Home  Anticipated DC to:  Home  Anticipated DC date:  1 to 2 days  Anticipated DC barriers: None  Consults called:  Orthopedic surgery Admission status:  Observation, telemetry  Severity of Illness: The appropriate patient status for this  patient is OBSERVATION. Observation status is judged to be reasonable and necessary in order to provide the required intensity of service to ensure the patient's safety. The patient's presenting symptoms, physical exam findings, and initial radiographic and laboratory data in the context of their medical condition is felt to place them at decreased risk for further clinical deterioration.  Furthermore, it is anticipated that the patient will be medically stable for discharge from the hospital within 2 midnights of admission.    Diane KATHEE Scurry MD [facility]  How to contact the [facility] Attending or Consulting provider 7A - 7P or covering provider during after hours 7P -7A, for this patient?   Check the care team in [facility] and look for a) attending/consulting [facility] provider listed and b) the [facility] team listed Log into [email or web address] and use [facility]'s universal password to access. If you do not have the password, please contact the hospital operator. Locate the [facility] provider you are looking for under [facility] and page to a number that you can be directly reached. If you still have difficulty reaching the provider, please page the [facility] (Director on Call) for the Hospitalists listed on amion for assistance.  02/14/2024, 3:12 PM

## 2024-02-15 ENCOUNTER — Other Ambulatory Visit: Payer: Self-pay

## 2024-02-15 ENCOUNTER — Observation Stay (HOSPITAL_COMMUNITY)

## 2024-02-15 DIAGNOSIS — Z6837 Body mass index (BMI) 37.0-37.9, adult: Secondary | ICD-10-CM | POA: Diagnosis not present

## 2024-02-15 DIAGNOSIS — Z823 Family history of stroke: Secondary | ICD-10-CM | POA: Diagnosis not present

## 2024-02-15 DIAGNOSIS — G928 Other toxic encephalopathy: Secondary | ICD-10-CM | POA: Diagnosis not present

## 2024-02-15 DIAGNOSIS — G09 Sequelae of inflammatory diseases of central nervous system: Secondary | ICD-10-CM | POA: Diagnosis not present

## 2024-02-15 DIAGNOSIS — Z9049 Acquired absence of other specified parts of digestive tract: Secondary | ICD-10-CM | POA: Diagnosis not present

## 2024-02-15 DIAGNOSIS — B9629 Other Escherichia coli [E. coli] as the cause of diseases classified elsewhere: Secondary | ICD-10-CM | POA: Diagnosis not present

## 2024-02-15 DIAGNOSIS — K5900 Constipation, unspecified: Secondary | ICD-10-CM | POA: Diagnosis not present

## 2024-02-15 DIAGNOSIS — Y844 Aspiration of fluid as the cause of abnormal reaction of the patient, or of later complication, without mention of misadventure at the time of the procedure: Secondary | ICD-10-CM | POA: Diagnosis present

## 2024-02-15 DIAGNOSIS — Z8249 Family history of ischemic heart disease and other diseases of the circulatory system: Secondary | ICD-10-CM | POA: Diagnosis not present

## 2024-02-15 DIAGNOSIS — G9341 Metabolic encephalopathy: Secondary | ICD-10-CM | POA: Diagnosis not present

## 2024-02-15 DIAGNOSIS — B9689 Other specified bacterial agents as the cause of diseases classified elsewhere: Secondary | ICD-10-CM | POA: Diagnosis present

## 2024-02-15 DIAGNOSIS — I10 Essential (primary) hypertension: Secondary | ICD-10-CM | POA: Diagnosis not present

## 2024-02-15 DIAGNOSIS — Z885 Allergy status to narcotic agent status: Secondary | ICD-10-CM | POA: Diagnosis not present

## 2024-02-15 DIAGNOSIS — T8463XA Infection and inflammatory reaction due to internal fixation device of spine, initial encounter: Secondary | ICD-10-CM | POA: Diagnosis not present

## 2024-02-15 DIAGNOSIS — Y838 Other surgical procedures as the cause of abnormal reaction of the patient, or of later complication, without mention of misadventure at the time of the procedure: Secondary | ICD-10-CM | POA: Diagnosis present

## 2024-02-15 DIAGNOSIS — E119 Type 2 diabetes mellitus without complications: Secondary | ICD-10-CM | POA: Diagnosis not present

## 2024-02-15 DIAGNOSIS — Z794 Long term (current) use of insulin: Secondary | ICD-10-CM | POA: Diagnosis not present

## 2024-02-15 DIAGNOSIS — G9609 Other spinal cerebrospinal fluid leak: Secondary | ICD-10-CM | POA: Diagnosis not present

## 2024-02-15 DIAGNOSIS — G038 Meningitis due to other specified causes: Secondary | ICD-10-CM | POA: Diagnosis present

## 2024-02-15 DIAGNOSIS — T148XXA Other injury of unspecified body region, initial encounter: Secondary | ICD-10-CM | POA: Diagnosis not present

## 2024-02-15 DIAGNOSIS — Z9842 Cataract extraction status, left eye: Secondary | ICD-10-CM | POA: Diagnosis not present

## 2024-02-15 DIAGNOSIS — N179 Acute kidney failure, unspecified: Secondary | ICD-10-CM | POA: Diagnosis not present

## 2024-02-15 DIAGNOSIS — T361X5A Adverse effect of cephalosporins and other beta-lactam antibiotics, initial encounter: Secondary | ICD-10-CM | POA: Diagnosis not present

## 2024-02-15 DIAGNOSIS — I808 Phlebitis and thrombophlebitis of other sites: Secondary | ICD-10-CM | POA: Diagnosis not present

## 2024-02-15 DIAGNOSIS — G9600 Cerebrospinal fluid leak, unspecified: Secondary | ICD-10-CM | POA: Diagnosis not present

## 2024-02-15 DIAGNOSIS — K529 Noninfective gastroenteritis and colitis, unspecified: Secondary | ICD-10-CM | POA: Diagnosis not present

## 2024-02-15 DIAGNOSIS — D638 Anemia in other chronic diseases classified elsewhere: Secondary | ICD-10-CM | POA: Diagnosis not present

## 2024-02-15 DIAGNOSIS — R000 Tachycardia, unspecified: Secondary | ICD-10-CM | POA: Diagnosis not present

## 2024-02-15 DIAGNOSIS — R251 Tremor, unspecified: Secondary | ICD-10-CM | POA: Diagnosis not present

## 2024-02-15 DIAGNOSIS — K219 Gastro-esophageal reflux disease without esophagitis: Secondary | ICD-10-CM | POA: Diagnosis present

## 2024-02-15 DIAGNOSIS — G4700 Insomnia, unspecified: Secondary | ICD-10-CM | POA: Diagnosis not present

## 2024-02-15 DIAGNOSIS — R5381 Other malaise: Secondary | ICD-10-CM | POA: Diagnosis not present

## 2024-02-15 DIAGNOSIS — E1140 Type 2 diabetes mellitus with diabetic neuropathy, unspecified: Secondary | ICD-10-CM | POA: Diagnosis not present

## 2024-02-15 DIAGNOSIS — G9782 Other postprocedural complications and disorders of nervous system: Secondary | ICD-10-CM | POA: Diagnosis not present

## 2024-02-15 DIAGNOSIS — R509 Fever, unspecified: Secondary | ICD-10-CM | POA: Diagnosis not present

## 2024-02-15 DIAGNOSIS — Z811 Family history of alcohol abuse and dependence: Secondary | ICD-10-CM | POA: Diagnosis not present

## 2024-02-15 DIAGNOSIS — R291 Meningismus: Secondary | ICD-10-CM | POA: Diagnosis present

## 2024-02-15 DIAGNOSIS — Z79899 Other long term (current) drug therapy: Secondary | ICD-10-CM | POA: Diagnosis not present

## 2024-02-15 DIAGNOSIS — R7989 Other specified abnormal findings of blood chemistry: Secondary | ICD-10-CM | POA: Diagnosis not present

## 2024-02-15 DIAGNOSIS — R7881 Bacteremia: Secondary | ICD-10-CM | POA: Diagnosis not present

## 2024-02-15 DIAGNOSIS — E538 Deficiency of other specified B group vitamins: Secondary | ICD-10-CM | POA: Diagnosis not present

## 2024-02-15 DIAGNOSIS — B9621 Shiga toxin-producing Escherichia coli [E. coli] (STEC) O157 as the cause of diseases classified elsewhere: Secondary | ICD-10-CM | POA: Diagnosis not present

## 2024-02-15 DIAGNOSIS — M48061 Spinal stenosis, lumbar region without neurogenic claudication: Secondary | ICD-10-CM | POA: Diagnosis not present

## 2024-02-15 DIAGNOSIS — M4316 Spondylolisthesis, lumbar region: Secondary | ICD-10-CM | POA: Diagnosis not present

## 2024-02-15 DIAGNOSIS — Z880 Allergy status to penicillin: Secondary | ICD-10-CM | POA: Diagnosis not present

## 2024-02-15 DIAGNOSIS — D72829 Elevated white blood cell count, unspecified: Secondary | ICD-10-CM | POA: Diagnosis not present

## 2024-02-15 DIAGNOSIS — Z961 Presence of intraocular lens: Secondary | ICD-10-CM | POA: Diagnosis present

## 2024-02-15 DIAGNOSIS — M51369 Other intervertebral disc degeneration, lumbar region without mention of lumbar back pain or lower extremity pain: Secondary | ICD-10-CM | POA: Diagnosis not present

## 2024-02-15 DIAGNOSIS — Z981 Arthrodesis status: Secondary | ICD-10-CM | POA: Diagnosis not present

## 2024-02-15 DIAGNOSIS — D62 Acute posthemorrhagic anemia: Secondary | ICD-10-CM | POA: Diagnosis present

## 2024-02-15 DIAGNOSIS — E1165 Type 2 diabetes mellitus with hyperglycemia: Secondary | ICD-10-CM | POA: Diagnosis present

## 2024-02-15 DIAGNOSIS — E669 Obesity, unspecified: Secondary | ICD-10-CM | POA: Diagnosis not present

## 2024-02-15 DIAGNOSIS — R519 Headache, unspecified: Secondary | ICD-10-CM | POA: Diagnosis not present

## 2024-02-15 DIAGNOSIS — T8463XD Infection and inflammatory reaction due to internal fixation device of spine, subsequent encounter: Secondary | ICD-10-CM | POA: Diagnosis not present

## 2024-02-15 DIAGNOSIS — E66812 Obesity, class 2: Secondary | ICD-10-CM | POA: Diagnosis present

## 2024-02-15 DIAGNOSIS — E7800 Pure hypercholesterolemia, unspecified: Secondary | ICD-10-CM | POA: Diagnosis not present

## 2024-02-15 LAB — COMPREHENSIVE METABOLIC PANEL WITH GFR
ALT: 26 U/L (ref 0–44)
AST: 14 U/L — ABNORMAL LOW (ref 15–41)
Albumin: 2.9 g/dL — ABNORMAL LOW (ref 3.5–5.0)
Alkaline Phosphatase: 39 U/L (ref 38–126)
Anion gap: 10 (ref 5–15)
BUN: 12 mg/dL (ref 8–23)
CO2: 29 mmol/L (ref 22–32)
Calcium: 9.3 mg/dL (ref 8.9–10.3)
Chloride: 101 mmol/L (ref 98–111)
Creatinine, Ser: 0.9 mg/dL (ref 0.44–1.00)
GFR, Estimated: >60 mL/min (ref >60)
Glucose, Bld: 117 mg/dL — ABNORMAL HIGH (ref 70–99)
Potassium: 5.1 mmol/L (ref 3.5–5.1)
Sodium: 140 mmol/L (ref 135–145)
Total Bilirubin: 0.7 mg/dL (ref 0.0–1.2)
Total Protein: 5.5 g/dL — ABNORMAL LOW (ref 6.5–8.1)

## 2024-02-15 LAB — CBC
HCT: 31.6 % — ABNORMAL LOW (ref 36.0–46.0)
Hemoglobin: 10.6 g/dL — ABNORMAL LOW (ref 12.0–15.0)
MCH: 31.6 pg (ref 26.0–34.0)
MCHC: 33.5 g/dL (ref 30.0–36.0)
MCV: 94.3 fL (ref 80.0–100.0)
Platelets: 283 K/uL (ref 150–400)
RBC: 3.35 MIL/uL — ABNORMAL LOW (ref 3.87–5.11)
RDW: 13.2 % (ref 11.5–15.5)
WBC: 10.2 K/uL (ref 4.0–10.5)
nRBC: 0 % (ref 0.0–0.2)

## 2024-02-15 LAB — GLUCOSE, CAPILLARY
Glucose-Capillary: 112 mg/dL — ABNORMAL HIGH (ref 70–99)
Glucose-Capillary: 120 mg/dL — ABNORMAL HIGH (ref 70–99)
Glucose-Capillary: 160 mg/dL — ABNORMAL HIGH (ref 70–99)
Glucose-Capillary: 131 mg/dL — ABNORMAL HIGH (ref 70–99)

## 2024-02-15 LAB — HIV ANTIBODY (ROUTINE TESTING W REFLEX): HIV Screen 4th Generation wRfx: NONREACTIVE

## 2024-02-15 MED ORDER — BUTALBITAL-APAP-CAFFEINE 50-325-40 MG PO TABS
2.0000 | ORAL_TABLET | Freq: Four times a day (QID) | ORAL | Status: DC | PRN
  Administered 2024-02-15 – 2024-02-24 (×14): 2 via ORAL
  Filled 2024-02-15 (×15): qty 2

## 2024-02-15 MED ORDER — LORATADINE 10 MG PO TABS
10.0000 mg | ORAL_TABLET | Freq: Every day | ORAL | Status: DC
  Administered 2024-02-15 – 2024-02-24 (×9): 10 mg via ORAL
  Filled 2024-02-15 (×9): qty 1

## 2024-02-15 MED ORDER — ONDANSETRON HCL 4 MG/2ML IJ SOLN
4.0000 mg | Freq: Four times a day (QID) | INTRAMUSCULAR | Status: DC | PRN
  Administered 2024-02-15 – 2024-02-24 (×9): 4 mg via INTRAVENOUS
  Filled 2024-02-15 (×8): qty 2

## 2024-02-15 MED ORDER — KETOROLAC TROMETHAMINE 15 MG/ML IJ SOLN
15.0000 mg | Freq: Four times a day (QID) | INTRAMUSCULAR | Status: AC | PRN
  Administered 2024-02-15 – 2024-02-17 (×4): 15 mg via INTRAVENOUS
  Filled 2024-02-15 (×4): qty 1

## 2024-02-15 NOTE — Consult Note (Addendum)
 Subjective:    The patient is a very pleasant 73 year old well-known to me.  She had a history of significant stenosis of the spondylolisthesis.  She underwent a lumbar decompression and fusion at the L4-5 level last Monday.  This proceeded uneventfully.  She was able to be discharged on Wednesday.  At that time, she did note a headache but this was not positional in nature.  In fact she felt better when standing and walking and moving around.  This is felt to be more of a muscular/tension style headache at that time.  While she was at home, she developed the rash from the oxycodone  and the Percocet.  She stopped the pain medications.  She continues to complain of pain in the incision area as well as some in the right buttock.  This is similar to her preoperative pain.  Her bigger concern however is a headache.  This is gotten progressively intense for her.  She reports that it does not seem to change with position.  She notes that she will have it when she wakes up even just turning in bed and not sitting up.  The pain does not seem to improve or worsen with standing versus laying down.  This prompted her to come to the emergency room yesterday where CT scan was performed.  This was concerning for fatty emboli seen in the CSF of the brain cisterns.  This prompted her admission.  She denies any fevers or chills.  Denies any weakness.  She is urinating and denies any severe constipation  Objective: Vital signs in last 24 hours: Temp:  [97.6 F (36.4 C)-98.7 F (37.1 C)] 98 F (36.7 C) (09/01 0828) Pulse Rate:  [80-107] 80 (09/01 0828) Resp:  [12-24] 17 (09/01 0828) BP: (132-195)/(62-118) 146/62 (09/01 0828) SpO2:  [92 %-100 %] 97 % (09/01 0828) Weight:  [93 kg] 93 kg (08/31 1132)  Intake/Output from previous day: 08/31 0701 - 09/01 0700 In: 243.5 [P.O.:120; IV Piggyback:123.5] Out: 500 [Urine:500]  Labs: Recent Labs    02/14/24 1345 02/15/24 0706  WBC 11.1* 10.2  RBC 3.44* 3.35*  HCT 32.5*  31.6*  PLT 317 283   Recent Labs    02/14/24 1207 02/15/24 0706  NA 138 140  K 4.0 5.1  CL 105 101  CO2 21* 29  BUN 18 12  CREATININE 1.10* 0.90  GLUCOSE 110* 117*  CALCIUM  8.3* 9.3   No results for input(s): LABPT, INR in the last 72 hours.  Physical Exam: Incision: Patient is clean and dry.  No fluctuance underneath. No cellulitis present Body mass index is 37.49 kg/m.  Incision C/D/I  Sensation     Right      Left  L3   Intact     Intact L4   Intact     Intact L5    Intact     Intact S1   Intact     Intact    Motor Exam     Right     Left Iliopsoas  5/5     5/5  Quad   5/5     5/5  Hamstring  5/5     5/5  Tibials Anterior 5/5     5/5  EHL   5/5     5/5  Gastrocs  5/5     5/5   CT scan of the brain IMPRESSION: 1. Abnormal intracranial lipid material, new since 2020 and most concentrated in the suprasellar and interpeduncular cisterns, but tiny foci elsewhere  in the posterior fossa. Superimposed small new CSF density sub-tentorial posterior fossa effusions also. 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   2. No other complicating features; no significant intracranial mass effect, no ventriculomegaly, no acute intracranial hemorrhage or infarct identified.    Assessment/Plan: 1 week status post lumbar fusion with persistent headaches.  With regards to the surgery itself, the patient seems to be doing reasonably well.  She still has back pain as well as some pain in the right buttock.  This is typical postoperative pain.  The bigger concern are the headaches.  Given the persistence of headaches after lumbar spine surgery, the most likely diagnosis would be a spinal fluid leak.  There was no apparent leak during the surgery despite a Valsalva maneuver in the headaches are not positional in nature, but certainly this would be the most common cause of a headache after spine surgery.  Fat emboli seen in the brain after spine surgery are relatively rare  without a spinal fluid leak.  At this point we will get an MRI scan of the lumbar spine to assess to see if there is any evidence of a leak.  I will also change some of her medications.  Will add Fioricet  and Toradol  to see if this better helps with the headaches.  We will keep her hydrated.  I will discuss this further with the hospitalist service.  If the MRI scan does not show a CSF leak, consideration could be given to a neurology consult  Cordella Rhein, MD, MS [facility] [phone]     Addendum  The MRI scan was reviewed.  This shows fluid the surgical site most consistent with CSF.  The patient also reports that her headaches now seem more positional and feel better when she is laying down.  The patient's headaches are most likely related to a CSF leak.  Will keep her bedrest flat for at least 24 hours and then reassess.  I discussed with the patient that if a trail of bed rest is not successful, then a revision surgery to identify and repair the leak may be necessary.  The patient expressed understanding of the plan.  We return tomorrow to reassess.

## 2024-02-15 NOTE — Plan of Care (Signed)
   Problem: Education: Goal: Knowledge of General Education information will improve Description Including pain rating scale, medication(s)/side effects and non-pharmacologic comfort measures Outcome: Progressing

## 2024-02-15 NOTE — Progress Notes (Signed)
 Bed exchanged out since it was not working. Took off the honey comb dressing since it was rolling up and partly off.  Area intact with steristrips and bruising noted as it was this am. SCDs on and operating. Pillow in between legs. Family at bedside. Pt medicated with fioricet .

## 2024-02-15 NOTE — Progress Notes (Signed)
 Diane Watkins  FMW:[id number] DOB: 1951/01/23 DOA: 02/14/2024 PCP: Cook, Jayce G, DO    Brief Narrative:  73 year old with a history of HTN, HLD, DM2, GERD, and a recent transforaminal lumbar decompression and fusion of L4-5 on 02/08/2024 with discharge from the hospital on 02/10/2024.  She reported the headache at the time of her discharge but clinical symptoms were felt to most likely represent a tension headache.  She returned to the ER 8/31 with persistent unrelenting severe generalized headache.  She had stopped taking her prescribed narcotic pain medication due to itching.  CT head suggestive intracranial lipid density material concentrated at the suprasellar and interpedicular cisterns as well as a small new CSF density at the subtentorial fossa, all suggestive of possible lipoid meningitis.  Goals of Care:   Code Status: Full Code   DVT prophylaxis: SCDs Start: 02/14/24 1503   Interim Hx: Afebrile since admission.  Vital signs stable.  CBG well-controlled.  No significant change in symptoms since presentation.  States her headache is currently improved, she holds still, but worsens with movement.  No chest pain or shortness of breath.  No other acute complaints.  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 rate 17, height 5' 2 (1.575 m), weight 93 kg, SpO2 97%.  Intake/Output Summary (Last 24 hours) at 02/15/2024 1044 Last data filed at  02/15/2024 0944 Gross per 24 hour  Intake 243.48 ml  Output 1100 ml  Net -856.52 ml   Filed Weights   02/14/24 1132  Weight: 93 kg    Examination: General: No acute respiratory distress Lungs: Clear to auscultation bilaterally without wheezes or crackles Cardiovascular: Regular rate and rhythm without murmur gallop or rub normal S1 and S2 Abdomen: Nontender, nondistended, soft, bowel sounds positive, no rebound, no ascites, no appreciable mass Extremities: No significant cyanosis, clubbing, or edema bilateral lower extremities  CBC: Recent Labs  Lab 02/09/24 0937 02/14/24 1345 02/15/24 0706  WBC 13.3* 11.1* 10.2  NEUTROABS  --  8.3*  --   HGB 11.5* 10.7* 10.6*  HCT 33.8* 32.5* 31.6*  MCV 93.1 94.5 94.3  PLT 272 317 283   Basic Metabolic Panel: Recent Labs  Lab 02/09/24 0937 02/14/24 1207 02/15/24 0706  NA 134* 138 140  K 4.1 4.0 5.1  CL 102 105 101  CO2 22 21* 29  GLUCOSE 228* 110* 117*  BUN 15 18 12   CREATININE 0.95 1.10* 0.90  CALCIUM  8.9 8.3* 9.3   GFR: Estimated Creatinine Clearance: 61.8 mL/min (by C-G formula based on SCr of 0.9 mg/dL).   Scheduled Meds:  insulin  aspart  0-15 Units Subcutaneous TID WC   lisinopril   20 mg Oral QPM   omega-3 acid ethyl esters  2 capsule Oral BID   rosuvastatin   40 mg Oral QPM   sodium chloride  flush  3 mL Intravenous Q12H     LOS: 0 days   Reyes IVAR Moores, MD [facility] Office  [phone] Pager - Text Page per Amion  If 7PM-7AM, please contact night-coverage  per Amion 02/15/2024, 10:44 AM

## 2024-02-15 NOTE — Plan of Care (Signed)
 Family at bedside, pt got some pain relief with the toradol .MRI done.

## 2024-02-16 DIAGNOSIS — R291 Meningismus: Secondary | ICD-10-CM | POA: Diagnosis not present

## 2024-02-16 LAB — GLUCOSE, CAPILLARY
Glucose-Capillary: 126 mg/dL — ABNORMAL HIGH (ref 70–99)
Glucose-Capillary: 130 mg/dL — ABNORMAL HIGH (ref 70–99)
Glucose-Capillary: 126 mg/dL — ABNORMAL HIGH (ref 70–99)
Glucose-Capillary: 132 mg/dL — ABNORMAL HIGH (ref 70–99)

## 2024-02-16 MED ORDER — DIPHENHYDRAMINE HCL 25 MG PO CAPS
25.0000 mg | ORAL_CAPSULE | Freq: Four times a day (QID) | ORAL | Status: DC | PRN
  Administered 2024-02-22: 25 mg via ORAL
  Filled 2024-02-16 (×3): qty 1

## 2024-02-16 MED ORDER — HYDROMORPHONE HCL 1 MG/ML IJ SOLN
0.5000 mg | INTRAMUSCULAR | Status: DC | PRN
  Administered 2024-02-16 – 2024-02-18 (×6): 1 mg via INTRAVENOUS
  Filled 2024-02-16 (×7): qty 1

## 2024-02-16 MED ORDER — HYDROMORPHONE HCL 1 MG/ML IJ SOLN
1.0000 mg | Freq: Once | INTRAMUSCULAR | Status: AC
  Administered 2024-02-16: 1 mg via INTRAVENOUS
  Filled 2024-02-16: qty 1

## 2024-02-16 MED ORDER — TRAZODONE HCL 50 MG PO TABS
50.0000 mg | ORAL_TABLET | Freq: Every day | ORAL | Status: DC
  Administered 2024-02-16 – 2024-02-19 (×3): 50 mg via ORAL
  Filled 2024-02-16 (×4): qty 1

## 2024-02-16 NOTE — Progress Notes (Signed)
 Pt c/o headache. Back pain and insomnia. Asked pt if she's taking anything for insomnia, said that pain med and flexeril  usually helps her sleep. Will give prn med. 0230= Pt was asleep when checked.

## 2024-02-16 NOTE — Anesthesia Preprocedure Evaluation (Signed)
 Anesthesia Evaluation  Patient identified by MRN, date of birth, ID band Patient awake    Reviewed: Allergy & Precautions, H&P , NPO status , Patient's Chart, lab work & pertinent test results, Unable to perform ROS - Chart review only  Airway Mallampati: III  TM Distance: >3 FB Neck ROM: Full    Dental no notable dental hx.    Pulmonary neg pulmonary ROS   Pulmonary exam normal breath sounds clear to auscultation       Cardiovascular hypertension, Pt. 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     Abdominal  (+) + obese  Peds negative pediatric ROS (+)  Hematology negative hematology ROS (+)   Anesthesia Other Findings   Reproductive/Obstetrics negative OB ROS                              Anesthesia Physical Anesthesia Plan  ASA: 2  Anesthesia Plan: General   Post-op Pain Management: Ofirmev  IV (intra-op)*   Induction: Intravenous  PONV Risk Score and Plan: 4 or greater and Treatment may vary due to age or medical condition, Ondansetron  and Dexamethasone   Airway Management Planned: Oral ETT  Additional Equipment: None  Intra-op Plan:   Post-operative Plan: Extubation in OR  Informed Consent: I have reviewed the patients History and Physical, chart, labs and discussed the procedure including the risks, benefits and alternatives for the proposed anesthesia with the patient or authorized representative who has indicated his/her understanding and acceptance.     Dental advisory given  Plan Discussed  with: CRNA  Anesthesia Plan Comments:          Anesthesia Quick Evaluation

## 2024-02-16 NOTE — Progress Notes (Signed)
 Subjective:  The patient continues to complain of significant headaches.  They have previously been positional nature but she recently had 1 when she is laying down.  She also reports some mild abdominal pain.  Her last bowel movement was Saturday.  She is passing gas.  Objective: Vital signs in last 24 hours: Temp:  [97.8 F (36.6 C)-98.6 F (37 C)] 98.6 F (37 C) (09/02 1558) Pulse Rate:  [76-91] 86 (09/02 1558) Resp:  [16-18] 17 (09/02 1558) BP: (124-153)/(56-75) 153/75 (09/02 1558) SpO2:  [96 %-100 %] 97 % (09/02 1558)  Intake/Output from previous day: 09/01 0701 - 09/02 0700 In: 870 [P.O.:870] Out: 600 [Urine:600]  Labs: Recent Labs    02/14/24 1345 02/15/24 0706  WBC 11.1* 10.2  RBC 3.44* 3.35*  HCT 32.5* 31.6*  PLT 317 283   Recent Labs    02/14/24 1207 02/15/24 0706  NA 138 140  K 4.0 5.1  CL 105 101  CO2 21* 29  BUN 18 12  CREATININE 1.10* 0.90  GLUCOSE 110* 117*  CALCIUM  8.3* 9.3   No results for input(s): LABPT, INR in the last 72 hours.  Physical Exam: The patient's abdomen is soft supple and nontender no distention Body mass index is 37.49 kg/m.      Assessment/Plan: Ms. Houseman continues have significant headaches that are even now starting to develop as she lays down.  She was also reporting increasing pain in the right buttock.  Given that the headaches are continuing despite an attempt at bedrest with her head of bed flat, at this point would recommend surgical exploration.  This will allow us  to identify the cause of the spinal fluid leak and hopefully either repair it directly or to at least [place] to prevent continued leakage of the fluid.  I believe this provides the best chance for relief from the patient's headaches as well as her right buttock pain.  We discussed other alternatives including continued trial of bedrest versus a blood patch.  Unfortunately she continues to have headaches despite bedrest and my clinical experience blood  patches, while effective for a spinal fluid headache after injection, are not effective in the setting of a prior laminectomy.  We discussed the risks of the surgery include but are not limited to bleeding, infection, continued CSF leak, injury to the nerves, and further resolve her symptoms.  We will tenably plan for surgery tomorrow.  The patient and her family also requested a second opinion from a neurosurgeon.  I reached out to Dr. Joshua for the second opinion and he has agreed to see the patient tomorrow.  We will make the patient n.p.o. at midnight and plan for tentative surgery tomorrow  Cordella Rhein, MD, MS [facility] [phone]

## 2024-02-16 NOTE — Progress Notes (Signed)
 Subjective:     Patient reports she is rested comfortably.  She denies any headaches falling down.  She reports she had trouble sleeping but was sleeping when arrived in the room and according to the nursing staff, was sleeping when they checked on her overnight  Objective: Vital signs in last 24 hours: Temp:  [97.9 F (36.6 C)-98.4 F (36.9 C)] 98.3 F (36.8 C) (09/02 0355) Pulse Rate:  [76-97] 86 (09/02 0355) Resp:  [15-18] 16 (09/02 0355) BP: (124-150)/(56-73) 136/72 (09/02 0355) SpO2:  [94 %-100 %] 100 % (09/02 0355)  Intake/Output from previous day: 09/01 0701 - 09/02 0700 In: 870 [P.O.:870] Out: 600 [Urine:600]  Labs: Recent Labs    02/14/24 1345 02/15/24 0706  WBC 11.1* 10.2  RBC 3.44* 3.35*  HCT 32.5* 31.6*  PLT 317 283   Recent Labs    02/14/24 1207 02/15/24 0706  NA 138 140  K 4.0 5.1  CL 105 101  CO2 21* 29  BUN 18 12  CREATININE 1.10* 0.90  GLUCOSE 110* 117*  CALCIUM  8.3* 9.3   No results for input(s): LABPT, INR in the last 72 hours.  Physical Exam: Well-appearing woman in no acute distress Body mass index is 37.49 kg/m.  Incision C/D/I  Sensation     Right      Left  L3   Intact     Intact L4   Intact     Intact L5    Intact     Intact S1   Intact     Intact    Motor Exam     Right     Left Iliopsoas  5/5     5/5  Quad   5/5     5/5  Hamstring  5/5     5/5  Tibials Anterior 5/5     5/5  EHL   5/5     5/5  Gastrocs  5/5     5/5      Assessment/Plan: Status post laminectomy and fusion with postoperative CSF leak.  The patient is resting comfortably.  The headache is resolved with her laying down.  At this point we will plan to keep her laying down, bedrest flat least through this evening.  I will return after office this afternoon we can reassess whether to keep her flat through the morning or to try to slowly set her up.  In the meantime we will continue with her current pain regimen.  Continuing teds and SCDs for DVT  prophylaxis.  Continue with current bowel regimen  Cordella Rhein, MD, MS [facility] [phone]

## 2024-02-16 NOTE — Plan of Care (Signed)

## 2024-02-16 NOTE — Progress Notes (Signed)
   02/16/24 1550  TOC Brief Assessment  Insurance and Status Reviewed  Patient has primary care physician Yes  Home environment has been reviewed spouse, supportive children  Prior level of function: independent  Prior/Current Home Services No current home services  Social Drivers of Health Review SDOH reviewed no interventions necessary  Readmission risk has been reviewed Yes  Transition of care needs no transition of care needs at this time     Transition of Care Department [facility]) has reviewed patient and no TOC needs have been identified at this time. We will continue to monitor patient advancement through interdisciplinary progression rounds. If new patient transition needs arise, please place a TOC consult.

## 2024-02-16 NOTE — Progress Notes (Addendum)
 Diane Watkins  FMW:[id number] DOB: Sep 12, 1950 DOA: 02/14/2024 PCP: Cook, Jayce G, DO    Brief Narrative:  73 year old with a history of HTN, HLD, DM2, GERD, and a recent transforaminal lumbar decompression and fusion at L4-5 on 02/08/2024 with discharge from the hospital on 02/10/2024.  She reported a headache at the time of her discharge but clinical symptoms were felt to most likely represent a tension headache.  She returned to the ER 8/31 with persistent unrelenting severe generalized headache.  She had stopped taking her prescribed narcotic pain medication due to itching.  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  Goals of Care:   Code Status: Full Code   DVT prophylaxis: SCDs Start: 02/14/24 1503   Interim Hx: Patient reports unrelenting headache at the time of my visit, despite remaining supine in bed for majority of the day thus far.  She denies any other new complaints.  Poor intake due to pain.  No fevers chills nausea vomiting or diarrhea.  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    Family Communication: No family present at time of exam Disposition: Eventual discharge home   Objective: Blood pressure 139/71, pulse 91, temperature 97.8 F  (36.6 C), temperature source Oral, resp. rate 16, height 5' 2 (1.575 m), weight 93 kg, SpO2 99%.  Intake/Output Summary (Last 24 hours) at 02/16/2024 1016 Last data filed at 02/16/2024 0300 Gross per 24 hour  Intake 750 ml  Output --  Net 750 ml   Filed Weights   02/14/24 1132  Weight: 93 kg    Examination: General: No acute respiratory distress Lungs: Clear to auscultation bilaterally without wheezes or crackles Cardiovascular: Regular rate and rhythm without murmur gallop or rub normal S1 and S2 Abdomen: Nontender, nondistended, soft, bowel sounds positive, no rebound Extremities: No significant edema bilateral lower extremities  CBC: Recent Labs  Lab 02/14/24 1345 02/15/24 0706  WBC 11.1* 10.2  NEUTROABS 8.3*  --   HGB 10.7* 10.6*  HCT 32.5* 31.6*  MCV 94.5 94.3  PLT 317 283   Basic Metabolic Panel: Recent Labs  Lab 02/14/24 1207 02/15/24 0706  NA 138 140  K 4.0 5.1  CL 105 101  CO2 21* 29  GLUCOSE 110* 117*  BUN 18 12  CREATININE 1.10* 0.90  CALCIUM  8.3* 9.3   GFR: Estimated Creatinine Clearance: 61.8 mL/min (by C-G formula based on SCr of 0.9 mg/dL).   Scheduled Meds:  insulin  aspart  0-15 Units Subcutaneous TID WC   lisinopril   20 mg Oral QPM   loratadine   10 mg Oral Daily   omega-3 acid ethyl esters  2 capsule Oral BID   rosuvastatin   40 mg Oral QPM   sodium chloride  flush  3 mL Intravenous Q12H     LOS: 1 day   Reyes  IVAR Moores, MD [facility] Office  [phone] Pager - Text Page per Amion  If 7PM-7AM, please contact night-coverage per Amion 02/16/2024, 10:16 AM

## 2024-02-17 ENCOUNTER — Inpatient Hospital Stay (HOSPITAL_COMMUNITY): Payer: Self-pay | Admitting: Anesthesiology

## 2024-02-17 ENCOUNTER — Other Ambulatory Visit: Payer: Self-pay

## 2024-02-17 ENCOUNTER — Encounter (HOSPITAL_COMMUNITY): Payer: Self-pay | Admitting: Internal Medicine

## 2024-02-17 ENCOUNTER — Encounter (HOSPITAL_COMMUNITY)
Admission: EM | Disposition: A | Discharge status: Rehabilitation Facility | Payer: Self-pay | Source: Home / Self Care | Attending: Internal Medicine

## 2024-02-17 DIAGNOSIS — G9600 Cerebrospinal fluid leak, unspecified: Secondary | ICD-10-CM

## 2024-02-17 DIAGNOSIS — R291 Meningismus: Secondary | ICD-10-CM | POA: Diagnosis not present

## 2024-02-17 HISTORY — PX: WOUND EXPLORATION: SHX6188

## 2024-02-17 HISTORY — PX: HARDWARE REMOVAL: SHX979

## 2024-02-17 LAB — BASIC METABOLIC PANEL WITH GFR
Anion gap: 11 (ref 5–15)
BUN: 15 mg/dL (ref 8–23)
CO2: 24 mmol/L (ref 22–32)
Calcium: 8.9 mg/dL (ref 8.9–10.3)
Chloride: 101 mmol/L (ref 98–111)
Creatinine, Ser: 0.98 mg/dL (ref 0.44–1.00)
GFR, Estimated: >60 mL/min (ref >60)
Glucose, Bld: 125 mg/dL — ABNORMAL HIGH (ref 70–99)
Potassium: 3.9 mmol/L (ref 3.5–5.1)
Sodium: 136 mmol/L (ref 135–145)

## 2024-02-17 LAB — CBC
HCT: 34.1 % — ABNORMAL LOW (ref 36.0–46.0)
Hemoglobin: 11.7 g/dL — ABNORMAL LOW (ref 12.0–15.0)
MCH: 31.6 pg (ref 26.0–34.0)
MCHC: 34.3 g/dL (ref 30.0–36.0)
MCV: 92.2 fL (ref 80.0–100.0)
Platelets: 365 K/uL (ref 150–400)
RBC: 3.7 MIL/uL — ABNORMAL LOW (ref 3.87–5.11)
RDW: 12.8 % (ref 11.5–15.5)
WBC: 13.6 K/uL — ABNORMAL HIGH (ref 4.0–10.5)
nRBC: 0 % (ref 0.0–0.2)

## 2024-02-17 LAB — GLUCOSE, CAPILLARY
Glucose-Capillary: 113 mg/dL — ABNORMAL HIGH (ref 70–99)
Glucose-Capillary: 111 mg/dL — ABNORMAL HIGH (ref 70–99)
Glucose-Capillary: 120 mg/dL — ABNORMAL HIGH (ref 70–99)
Glucose-Capillary: 116 mg/dL — ABNORMAL HIGH (ref 70–99)
Glucose-Capillary: 128 mg/dL — ABNORMAL HIGH (ref 70–99)
Glucose-Capillary: 146 mg/dL — ABNORMAL HIGH (ref 70–99)

## 2024-02-17 SURGERY — WOUND EXPLORATION
Anesthesia: General | Site: Back

## 2024-02-17 MED ORDER — ONDANSETRON HCL 4 MG/2ML IJ SOLN
INTRAMUSCULAR | Status: AC
Start: 2024-02-17 — End: 2024-02-17
  Filled 2024-02-17: qty 2

## 2024-02-17 MED ORDER — SODIUM CHLORIDE 0.9% FLUSH
3.0000 mL | INTRAVENOUS | Status: DC | PRN
  Administered 2024-02-17: 3 mL via INTRAVENOUS

## 2024-02-17 MED ORDER — CHLORHEXIDINE GLUCONATE 0.12 % MT SOLN: 15.0000 mL | Freq: Once | OROMUCOSAL | Status: AC

## 2024-02-17 MED ORDER — HYDROMORPHONE HCL 1 MG/ML IJ SOLN
INTRAMUSCULAR | Status: AC
  Filled 2024-02-17: qty 1

## 2024-02-17 MED ORDER — DEXAMETHASONE SODIUM PHOSPHATE 10 MG/ML IJ SOLN
INTRAMUSCULAR | Status: DC | PRN
  Administered 2024-02-17: 5 mg via INTRAVENOUS

## 2024-02-17 MED ORDER — PHENYLEPHRINE 80 MCG/ML (10ML) SYRINGE FOR IV PUSH (FOR BLOOD PRESSURE SUPPORT)
PREFILLED_SYRINGE | INTRAVENOUS | Status: DC | PRN
  Administered 2024-02-17 (×2): 80 ug via INTRAVENOUS

## 2024-02-17 MED ORDER — INSULIN ASPART 100 UNIT/ML IJ SOLN: 0.0000 [IU] | INTRAMUSCULAR | Status: DC | PRN

## 2024-02-17 MED ORDER — ALPRAZOLAM 0.25 MG PO TABS
0.2500 mg | ORAL_TABLET | Freq: Three times a day (TID) | ORAL | Status: DC | PRN
  Administered 2024-02-19 – 2024-02-23 (×9): 0.25 mg via ORAL
  Filled 2024-02-17 (×9): qty 1

## 2024-02-17 MED ORDER — ORAL CARE MOUTH RINSE: 15.0000 mL | Freq: Once | OROMUCOSAL | Status: AC

## 2024-02-17 MED ORDER — PHENYLEPHRINE HCL-NACL 20-0.9 MG/250ML-% IV SOLN
INTRAVENOUS | Status: DC | PRN
  Administered 2024-02-17: 50 ug/min via INTRAVENOUS

## 2024-02-17 MED ORDER — LACTATED RINGERS IV SOLN: INTRAVENOUS | Status: DC

## 2024-02-17 MED ORDER — PROPOFOL 10 MG/ML IV BOLUS
INTRAVENOUS | Status: DC | PRN
  Administered 2024-02-17: 110 mg via INTRAVENOUS

## 2024-02-17 MED ORDER — FENTANYL CITRATE (PF) 250 MCG/5ML IJ SOLN
INTRAMUSCULAR | Status: AC
  Filled 2024-02-17: qty 5

## 2024-02-17 MED ORDER — ROCURONIUM BROMIDE 10 MG/ML (PF) SYRINGE
PREFILLED_SYRINGE | INTRAVENOUS | Status: DC | PRN
  Administered 2024-02-17: 50 mg via INTRAVENOUS

## 2024-02-17 MED ORDER — SODIUM CHLORIDE 0.9% FLUSH
3.0000 mL | Freq: Two times a day (BID) | INTRAVENOUS | Status: DC
  Administered 2024-02-17 – 2024-02-23 (×8): 3 mL via INTRAVENOUS

## 2024-02-17 MED ORDER — FENTANYL CITRATE (PF) 250 MCG/5ML IJ SOLN
INTRAMUSCULAR | Status: DC | PRN
  Administered 2024-02-17 (×5): 50 ug via INTRAVENOUS

## 2024-02-17 MED ORDER — LIDOCAINE 2% (20 MG/ML) 5 ML SYRINGE
INTRAMUSCULAR | Status: DC | PRN
  Administered 2024-02-17: 100 mg via INTRAVENOUS

## 2024-02-17 MED ORDER — 0.9 % SODIUM CHLORIDE (POUR BTL) OPTIME
TOPICAL | Status: DC | PRN
  Administered 2024-02-17: 1000 mL

## 2024-02-17 MED ORDER — SODIUM CHLORIDE 0.9 % IV SOLN
250.0000 mL | INTRAVENOUS | Status: AC
  Administered 2024-02-17: 250 mL via INTRAVENOUS

## 2024-02-17 MED ORDER — CEFAZOLIN SODIUM-DEXTROSE 2-4 GM/100ML-% IV SOLN
2.0000 g | Freq: Four times a day (QID) | INTRAVENOUS | Status: AC
  Administered 2024-02-17 – 2024-02-18 (×2): 2 g via INTRAVENOUS
  Filled 2024-02-17 (×2): qty 100

## 2024-02-17 MED ORDER — ROCURONIUM BROMIDE 10 MG/ML (PF) SYRINGE
PREFILLED_SYRINGE | INTRAVENOUS | Status: AC
  Filled 2024-02-17: qty 10

## 2024-02-17 MED ORDER — LIDOCAINE 2% (20 MG/ML) 5 ML SYRINGE
INTRAMUSCULAR | Status: AC
  Filled 2024-02-17: qty 5

## 2024-02-17 MED ORDER — SUGAMMADEX SODIUM 200 MG/2ML IV SOLN
INTRAVENOUS | Status: DC | PRN
  Administered 2024-02-17: 200 mg via INTRAVENOUS

## 2024-02-17 MED ORDER — SODIUM CHLORIDE 0.9 % IV SOLN: INTRAVENOUS | Status: AC

## 2024-02-17 MED ORDER — HYDROMORPHONE HCL 1 MG/ML IJ SOLN
0.2500 mg | INTRAMUSCULAR | Status: DC | PRN
  Administered 2024-02-17: 0.5 mg via INTRAVENOUS
  Administered 2024-02-17 (×2): 0.25 mg via INTRAVENOUS

## 2024-02-17 MED ORDER — PROPOFOL 10 MG/ML IV BOLUS
INTRAVENOUS | Status: AC
  Filled 2024-02-17: qty 20

## 2024-02-17 MED ORDER — CEFAZOLIN SODIUM-DEXTROSE 2-4 GM/100ML-% IV SOLN
2.0000 g | Freq: Once | INTRAVENOUS | Status: AC
  Administered 2024-02-17: 2 g via INTRAVENOUS
  Filled 2024-02-17: qty 100

## 2024-02-17 MED ORDER — DROPERIDOL 2.5 MG/ML IJ SOLN: 0.6250 mg | Freq: Once | INTRAMUSCULAR | Status: DC | PRN

## 2024-02-17 MED ORDER — CHLORHEXIDINE GLUCONATE 0.12 % MT SOLN
OROMUCOSAL | Status: AC
  Administered 2024-02-17: 15 mL via OROMUCOSAL
  Filled 2024-02-17: qty 15

## 2024-02-17 SURGICAL SUPPLY — 56 items
ALCOHOL 70% 16 OZ (MISCELLANEOUS) ×3 IMPLANT
BAG COUNTER SPONGE SURGICOUNT (BAG) ×3 IMPLANT
BLADE BONE MILL MEDIUM (MISCELLANEOUS) ×3 IMPLANT
BLADE CLIPPER SURG (BLADE) IMPLANT
BLANKET WARM LOWER BOD BAIR (MISCELLANEOUS) ×3 IMPLANT
BUR MATCHSTICK NEURO 3.0 LAGG (BURR) ×3 IMPLANT
CANISTER SUCTION 3000ML PPV (SUCTIONS) ×3 IMPLANT
CHLORAPREP W/TINT 26 (MISCELLANEOUS) ×3 IMPLANT
CNTNR URN SCR LID CUP LEK RST (MISCELLANEOUS) ×3 IMPLANT
COVER BACK TABLE 60X90IN (DRAPES) ×3 IMPLANT
COVER SURGICAL LIGHT HANDLE (MISCELLANEOUS) ×3 IMPLANT
DERMABOND ADVANCED .7 DNX12 (GAUZE/BANDAGES/DRESSINGS) ×3 IMPLANT
DEVICE DISSECT PLASMABLAD 3.0S (MISCELLANEOUS) ×3 IMPLANT
DRAPE C-ARM 42X72 X-RAY (DRAPES) ×6 IMPLANT
DRAPE HALF SHEET 40X57 (DRAPES) IMPLANT
DRAPE LAPAROTOMY 100X72X124 (DRAPES) ×3 IMPLANT
DRAPE SURG 17X23 STRL (DRAPES) ×12 IMPLANT
DURASEAL APPLICATOR TIP (TIP) IMPLANT
DURASEAL SPINE SEALANT 3ML (MISCELLANEOUS) IMPLANT
ELECTRODE REM PT RTRN 9FT ADLT (ELECTROSURGICAL) ×3 IMPLANT
GAUZE 4X4 16PLY ~~LOC~~+RFID DBL (SPONGE) IMPLANT
GAUZE SPONGE 4X4 12PLY STRL (GAUZE/BANDAGES/DRESSINGS) ×3 IMPLANT
GAUZE XEROFORM 5X9 LF (GAUZE/BANDAGES/DRESSINGS) IMPLANT
GLOVE SURG SYN 8.0 PF PI (GLOVE) ×6 IMPLANT
GOWN STRL REUS W/ TWL LRG LVL3 (GOWN DISPOSABLE) IMPLANT
GOWN STRL REUS W/ TWL XL LVL3 (GOWN DISPOSABLE) IMPLANT
GOWN STRL REUS W/TWL 2XL LVL3 (GOWN DISPOSABLE) ×3 IMPLANT
GRAFT DURAGEN MATRIX 1WX1L (Tissue) IMPLANT
KIT BASIN OR (CUSTOM PROCEDURE TRAY) ×3 IMPLANT
KIT TURNOVER KIT B (KITS) ×3 IMPLANT
MILL BONE PREP (MISCELLANEOUS) ×3 IMPLANT
NDL SPNL 22GX3.5 QUINCKE BK (NEEDLE) ×3 IMPLANT
NEEDLE SPNL 22GX3.5 QUINCKE BK (NEEDLE) ×2 IMPLANT
NS IRRIG 1000ML POUR BTL (IV SOLUTION) ×3 IMPLANT
PACK LAMINECTOMY ORTHO (CUSTOM PROCEDURE TRAY) ×3 IMPLANT
PAD ARMBOARD POSITIONER FOAM (MISCELLANEOUS) ×9 IMPLANT
PATTIES SURGICAL .5 X1 (DISPOSABLE) ×3 IMPLANT
SEALANT ADHERUS EXTEND TIP (MISCELLANEOUS) IMPLANT
SPIKE FLUID TRANSFER (MISCELLANEOUS) ×3 IMPLANT
SPONGE T-LAP 4X18 ~~LOC~~+RFID (SPONGE) IMPLANT
STRIP CLOSURE SKIN 1/2X4 (GAUZE/BANDAGES/DRESSINGS) ×3 IMPLANT
SUT ETHILON 3 0 PS 1 (SUTURE) IMPLANT
SUT PDS AB 1 CT1 36 (SUTURE) IMPLANT
SUT PROLENE 4 0 P 3 18 (SUTURE) IMPLANT
SUT PROLENE 6 0 BV (SUTURE) IMPLANT
SUT VIC AB 1 CT1 18XBRD ANBCTR (SUTURE) ×3 IMPLANT
SUT VIC AB 1 CT1 27XBRD ANBCTR (SUTURE) IMPLANT
SUT VIC AB 2-0 CP2 18 (SUTURE) ×3 IMPLANT
SUT VIC AB 2-0 CT1 TAPERPNT 27 (SUTURE) IMPLANT
SUTURE MNCRL 4-0 27XMF (SUTURE) ×3 IMPLANT
SYR CONTROL 10ML LL (SYRINGE) ×3 IMPLANT
TAPE CLOTH SURG 4X10 WHT LF (GAUZE/BANDAGES/DRESSINGS) IMPLANT
TOWEL GREEN STERILE (TOWEL DISPOSABLE) ×3 IMPLANT
TOWEL GREEN STERILE FF (TOWEL DISPOSABLE) ×3 IMPLANT
TRAY FOLEY MTR SLVR 16FR STAT (SET/KITS/TRAYS/PACK) IMPLANT
WATER STERILE IRR 1000ML POUR (IV SOLUTION) ×3 IMPLANT

## 2024-02-17 NOTE — Transfer of Care (Signed)
 Immediate Anesthesia Transfer of Care Note  Patient: Diane Watkins  Procedure(s) Performed: WOUND EXPLORATION (Back) REMOVAL, HARDWARE  Patient Location: PACU  Anesthesia Type:General  Level of Consciousness: awake and alert   Airway & Oxygen Therapy: Patient Spontanous Breathing and Patient connected to nasal cannula oxygen  Post-op Assessment: Report given to RN and Post -op Vital signs reviewed and stable  Post vital signs: Reviewed and stable  Last Vitals:  Vitals Value Taken Time  BP 147/66 02/17/24 19:50  Temp    Pulse 92 02/17/24 19:53  Resp 16 02/17/24 19:53  SpO2 96 % 02/17/24 19:53  Vitals shown include unfiled device data.  Last Pain:  Vitals:   02/17/24 1449  TempSrc: Oral  PainSc: 10-Worst pain ever      Patients Stated Pain Goal: 2 (02/15/24 2346)  Complications: No notable events documented.

## 2024-02-17 NOTE — Progress Notes (Signed)
 Patient ID: Diane Watkins, female   DOB: 22-Feb-1951, 73 y.o.   MRN: [id number] I came by and get a courtesy consult on this patient today as the family requested an opinion by neurosurgeon.  She had a 1 level lumbar fusion about 9 days ago.  She presented with headache and back pain and hip pain.  CT scan of the head was done and an MRI of the lumbar spine was done.  I have reviewed those as well as the results.  The patient's wound is clean and dry and she was lying in bed complaining of a headache.  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  I have talked to the patient into her family at length and tried to answer all of their questions to the best of my ability.  I have described the situation to them as best I could and I completely agree with the plan to repair the pseudomeningocele later today.  They seem satisfied and relieved and she is ready to move forward.  Please let me know if I can help in any way

## 2024-02-17 NOTE — Anesthesia Postprocedure Evaluation (Signed)
 Anesthesia Post Note  Patient: Diane Watkins  Procedure(s) Performed: WOUND EXPLORATION (Back) REMOVAL, HARDWARE     Patient location during evaluation: PACU Anesthesia Type: General Level of consciousness: awake and alert Pain management: pain level controlled Vital Signs Assessment: post-procedure vital signs reviewed and stable Respiratory status: spontaneous breathing, nonlabored ventilation, respiratory function stable and patient connected to nasal cannula oxygen Cardiovascular status: blood pressure returned to baseline and stable Postop Assessment: no apparent nausea or vomiting Anesthetic complications: no   No notable events documented.  Last Vitals:  Vitals:   02/17/24 2015 02/17/24 2030  BP: (!) 155/97 (!) 155/82  Pulse: 90 88  Resp: (!) 21 (!) 25  Temp:    SpO2: 100% 93%    Last Pain:  Vitals:   02/17/24 2015  TempSrc:   PainSc: 5                  Franky JONETTA Bald

## 2024-02-17 NOTE — Progress Notes (Signed)
 Subjective: The patient is resting in bed.  She reports no current headache but did report 1 earlier this morning.  She did have a good night sleep.  Her right leg pain is currently okay.  She does report some degree of back pain which she describes from lying in bed.  She does report some degree of lower abdominal discomfort her last bowel movement was Saturday.  She is passing flatus.  Objective: Vital signs in last 24 hours: Temp:  [97.8 F (36.6 C)-98.7 F (37.1 C)] 98.7 F (37.1 C) (09/03 0758) Pulse Rate:  [81-103] 103 (09/03 0758) Resp:  [16-17] 16 (09/03 0758) BP: (120-164)/(71-78) 144/78 (09/03 0758) SpO2:  [94 %-99 %] 98 % (09/03 0758)  Intake/Output from previous day: 09/02 0701 - 09/03 0700 In: 360 [P.O.:360] Out: 700 [Urine:700]  Labs: Recent Labs    02/14/24 1345 02/15/24 0706  WBC 11.1* 10.2  RBC 3.44* 3.35*  HCT 32.5* 31.6*  PLT 317 283   Recent Labs    02/14/24 1207 02/15/24 0706  NA 138 140  K 4.0 5.1  CL 105 101  CO2 21* 29  BUN 18 12  CREATININE 1.10* 0.90  GLUCOSE 110* 117*  CALCIUM  8.3* 9.3   No results for input(s): LABPT, INR in the last 72 hours.  Physical Exam: Well-appearing woman in no acute distress.  She is alert and interactive.  Her abdomen is soft, nontender and with no rebound. Body mass index is 37.49 kg/m.  Incision C/D/I  Sensation     Right      Left  L3   Intact     Intact L4   Intact     Intact L5    Intact     Intact S1   Intact     Intact    Motor Exam     Right     Left Iliopsoas  5/5     5/5  Quad   5/5     5/5  Hamstring  5/5     5/5  Tibials Anterior 5/5     5/5  EHL   5/5     5/5  Gastrocs  5/5     5/5      Assessment/Plan: The patient continue have headaches likely related to her spinal fluid leak.  She did do somewhat better overnight but again has lessened but still having significant headaches.  They requested a second opinion contact Dr. Joshua will hopefully see the patient later on  today.  This point we are still just being surgical expiration likely later on today after Dr. Joshua has had an opportunity to evaluate the patient.  I discussed this with the patient as well as her daughter-in-law.  All questions were answered and discussed.  Cordella Rhein, MD, MS [facility] [phone]

## 2024-02-17 NOTE — Op Note (Signed)
 PATIENT NAME: Diane Watkins   MEDICAL RECORD NO.:   [id number]    DATE OF BIRTH: DOB@    DATE OF PROCEDURE: TD@                                OPERATIVE REPORT   PREOPERATIVE DIAGNOSES: 1 spinal headache 2 CSF leak  POSTOPERATIVE DIAGNOSES: 1.  Same  PROCEDURE: Exploration of lumbar wound with closure of dural defect  SURGEON:  Cordella Rhein, MD, MS.  ASSISTANT:   ANESTHESIA:  General endotracheal anesthesia.  COMPLICATIONS:  None.  DISPOSITION:  Stable.  ESTIMATED BLOOD LOSS: Minimal  INDICATIONS FOR SURGERY:  [name] is a very pleasant 73 year-old patient, who did present to me with pain in the bilateral legs. He had undergone a lumbar laminectomy with transforaminal lumbar interbody fusion last week.  The surgery was uneventful.  No CSF was noted at that time.  She was able to be discharged to home, but after developed a headache prompting readmission.  A trial of bedrest flat was attempted but this was unsuccessful.  Therefore elected return to surgery for exploration of the wounds and closure of any CSF leak.  Prior to the surgery, the risks benefits alternatives were discussed.  The risks include but are not limited to bleeding, infection, injury to the nerves, injury to the dura, CSF leakage, iatrogenic instability, recurrent stenosis, failure to resolve the pre-operative symptoms and the need for additional surgery.    OPERATIVE DETAILS:  On 02/17/2024, the patient was brought to surgery and general endotracheal anesthesia was administered.  The patient was placed prone on a well-padded flat Jackson bed. Antibiotics were given and the back was prepped and draped in the usual sterile fashion.  A time-out procedure was performed.  The previous incision was opened using a 10 blade.  Some CSF was noted in the subcutaneous tissues.  The fascia was explored.  A small fascial defect was noted superiorly.  We removed the sutures and opened up the fascial plane.  CSF was  also noted deep in this area.  We exposed the dura.  On the right side, the screws were removed without difficulty.  A spinal fluid leak was evident on the ventral right lateral side of the thecal sac.  This was repaired primarily with a 6-0 Prolene.  We then placed DuraGen followed by Vistaseal.  No further CSF was noted to leak at this point.  The wound was irrigated.  The screws were replaced.  Rods and locking caps were applied and final tightened.  The fascia was closed with #1 Vicryl.  This was then oversewn with a running locking #1 PDS.  The 5 tissues were closed with a 1 Vicryl.  2-0 Vicryl was used with the dermal layers followed by 3-0 nylon for the skin.  The wound was washed and dressed with Aquacel 4 x 4's and tape.    All instrument counts were correct at the termination of the procedure.  The patient was awoken from anesthesia and transferred to the PACU in stable condition.     Cordella Rhein, MD, MS [facility] [phone]

## 2024-02-17 NOTE — Hospital Course (Addendum)
 Diane Watkins is a 73 y.o. year old female with PMH of  HTN, HLD, DM2, GERD, and a recent transforaminal lumbar decompression and fusion at L4-5 on 02/08/2024 with discharge from the hospital on 02/10/2024. She reported a headache at the time of her discharge but clinical symptoms were felt to most likely represent a tension headache. She returned to the ER 8/31 with persistent unrelenting severe generalized headache.  She had stopped taking her prescribed narcotic pain medication due to itching.  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 Patient admitted for persistent headache, orthospine surgery consulted Patient completed MRI showing persistent leak 9/3 after second opinion by Dr. Joshua underwent exploration of the lumbar wound with closure of dural defect.  Subjective: Seen and examined Worked with PT OT.  Underwent bedside chair No headache no other abdominal pain Overnight had fever of one 101.1 AT 10 PM. 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 failed to improve with conservative rx >after second opinion by Dr. Joshua s/p exploration lumbar wound with closure of dural defect 9/3 Holding chemical prophylaxis for now per surgery team Dilaudid  was discontinued due to concern for altered mental status,on toradol  tramadol  and Tylenol  as needed Given patient's fever and bacteremia  underwent MRI lumbar spine and right hip with and without contrast 9/7-reported as below, follow-up orthospine-do not suspect postop infection:  No evidence of septic arthritis or osteomyelitis in the pelvis or right hip, question mild tendinosis and partial tearing of the common hamstring tendons bilaterally, postsurgical change in the lumbar spine-CT report for detail. Mri hi[> Wall thickening enhancement  throughout the rectosigmoid colon with surrounding inflammation and a small amount of free  pelvic fluid suspicious for colitis. 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 UA 9/5   neg LE/Nitrite, blood culture sent-positive with Enterobacter clocae-now on cefepime .  Temperature spike x 1 last night, continue wound care, ID input appreciated Finding of rectosigmoid colitis on MRI-discussed with ID this morning continue current antibiotics, ID continues to follow. Check stool studies but she had received bowl regimen for constipation  Acute metabolic encephalopathy Lipoid meningitis Right hand tremor B12 deficiency: Due to intermittent confusion neurology was consulted and MRI brain with and without obtained-showed T1 hyperintense in the infratentorial and supratentorial extra axial spaces correlating with findings on recent CT and compatible with lipid material, consider lipoid meningitis-monitor syndrome per neurology .  TSH 4.7, ammonia normal, b12 low Continue IV fluid hydration to ensure CSF pressure remains normal in light of headache still being positional.   Some right hand tremor- will monitor, cont b12 replacement.  Postoperative anemia- possible ABLA Anemia of chronic illness: hemoglobin slowly drifting will monitor closely ,  transfuse if less than 7   Recent  Labs  Lab 02/18/24 0228 02/19/24 0224 02/20/24 0910 02/21/24 0424 02/22/24 0227  HGB 12.0 10.6* 10.6* 11.7* 9.3*  HCT 34.5* 30.7* 30.9* 36.1 27.9*     DM2 with  uncontrolled hyperglycemia: Fairly ok, continue SSI Recent Labs  Lab 02/21/24 1342 02/21/24 1518 02/21/24 1953 02/21/24 2243 02/22/24 0854  GLUCAP 119* 101* 211* 160* 152*     HTN: Stable, Continue home lisinopril .   HLD: Continue home atorvastatin  and Lovaza .  Constipation: Having bowel movement - now continue stool softener   Class II Obesity w/ Body mass index is 37.49 kg/m.: Will benefit with PCP follow-up, weight loss,healthy lifestyle and outpatient sleep eval.  Mobility: PT Orders: Active PT Follow up Rec: Acute Inpatient Rehab (3hours/Day)02/21/2024 1700    DVT prophylaxis: enoxaparin  (LOVENOX ) injection 40 mg Start: 02/22/24 1245 SCD's Start: 02/17/24 2112 SCDs Start: 02/14/24 1503 discussed with Dr.  Mardi and ok to start lovenox  prophylaxis on 9/8  Code Status:   Code Status: Full Code Family Communication: plan of care discussed with patient/ family updated at bedside Patient status is: Remains hospitalized because of severity of illness Level of care: Med-Surg   Dispo: The patient is from: home            Anticipated disposition: TBD Objective: Vitals last 24 hrs: Vitals:   02/21/24 2245 02/22/24 0036 02/22/24 0641 02/22/24 0851  BP:  110/74 (!) 162/81 133/76  Pulse:  (!) 110 93 92  Resp:  18 18 18   Temp: (!) 101.1 F (38.4 C) 98.8 F (37.1 C) 98.3 F (36.8 C) 98.5 F (36.9 C)  TempSrc: Rectal Oral Oral Oral  SpO2:  99% 97% 96%  Weight:      Height:       Physical Examination: General exam: AAOx3, no complaints HEENT:Oral mucosa moist, Ear/Nose WNL grossly Respiratory system: B/l clear BS,no use of accessory muscle Cardiovascular system: S1 & S2 +, No JVD. Gastrointestinal system: Abdomen soft, mildly tender lower abdomen,ND, BS+ Nervous  System: Alert, awake, moving all extremities. Non focal Extremities: LE edema neg, distal extremities warm.  Skin:No icterus.  Surgical site with honeycomb dressing+ MSK: Normal muscle bulk,tone, power   Medications reviewed:  Scheduled Meds:  vitamin B-12  1,000 mcg Oral Daily   docusate sodium   100 mg Oral BID   enoxaparin  (LOVENOX ) injection  40 mg Subcutaneous Q24H   insulin  aspart  0-15 Units Subcutaneous TID WC   lisinopril   20 mg Oral QPM   loratadine   10 mg Oral Daily   neomycin -bacitracin -polymyxin   Topical BID   omega-3 acid ethyl esters  2 capsule Oral BID   rosuvastatin   40 mg Oral QPM   senna-docusate  1 tablet Oral BID   sodium chloride  flush  3 mL Intravenous Q12H   Continuous Infusions:  ceFEPime  (MAXIPIME ) IV 2 g (02/22/24 0949)    Diet: Diet Order             Diet regular Room service appropriate? Yes; Fluid consistency: Thin  Diet effective now

## 2024-02-17 NOTE — Plan of Care (Signed)

## 2024-02-17 NOTE — Progress Notes (Signed)
 PROGRESS NOTE Diane Watkins  FMW:[id number] DOB: 1950/07/24 DOA: 02/14/2024 PCP: Watkins, Diane G, DO  Brief Narrative/Hospital Course: Diane Watkins is a 73 y.o. year old female with PMH of  HTN, HLD, DM2, GERD, and a recent transforaminal lumbar decompression and fusion at L4-5 on 02/08/2024 with discharge from the hospital on 02/10/2024. She reported a headache at the time of her discharge but clinical symptoms were felt to most likely represent a tension headache. She returned to the ER 8/31 with persistent unrelenting severe generalized headache.  She had stopped taking her prescribed narcotic pain medication due to itching.  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  Patient admitted for persistent headache, orthospine surgery consulted  Subjective: Seen and examined today Lots of headache Some rt hip pain this am-currently known.Her sister is at the bedside Overnight afebrile BP stable on room air Blood sugar stable labs reviewed from 9/1 unremarkable for new changes No labs today- ordered for am.  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 Joshua for second opinion 9/3.  Sister requesting an irregularity at the low-dose Xanax , continue on PRN Tylenol /Fioricet /Flexeril /Dilaudid  for pain control   Mild postoperative anemia hb stable. Monitor   Anemia of chronic illness: hemoglobin stable around 10 g   DM2: Well-controlled, continue SSI Recent Labs  Lab 02/16/24 0755 02/16/24 1142 02/16/24 1701  02/16/24 2038 02/17/24 0804  GLUCAP 126* 130* 126* 132* 113*     HTN: Stable, Continue home lisinopril .   HLD: Continue home atorvastatin  and Lovaza .   Class II Obesity w/ Body mass index is 37.49 kg/m.: Will benefit with PCP follow-up, weight loss,healthy lifestyle and outpatient sleep eval.  PT OT: Pending for now  DVT prophylaxis: SCDs Start: 02/14/24 1503 Code Status:   Code Status: Full Code Family Communication: plan of care discussed with patient/sister at bedside. Patient status is: Remains hospitalized because of severity of illness Level of care: Med-Surg   Dispo: The patient is from: home            Anticipated disposition: TBD Objective: Vitals last 24 hrs: Vitals:   02/16/24 2037 02/17/24 0441 02/17/24 0758 02/17/24 0935  BP: 120/73 (!) 164/77 (!) 144/78 (!) 176/94  Pulse: 81  (!) 103 (!) 101  Resp:   16 16  Temp: 98.2 F (36.8 C) 98.6 F (37 C) 98.7 F (37.1 C)   TempSrc: Oral  Oral   SpO2: 98% 94% 98% 98%  Weight:      Height:        Physical Examination: General exam: alert awake, eyes covered by mask  HEENT:Oral mucosa moist, Ear/Nose WNL grossly Respiratory system: Bilaterally diminished BS,no use of accessory muscle Cardiovascular system: S1 & S2 +, No JVD. Gastrointestinal system: Abdomen soft,NT,ND, BS+ Nervous System: Alert, awake, moving all extremities, sensation intact in legs and moving well Extremities: LE edema neg, distal extremities warm.  Skin: No rashes,no icterus. MSK: Normal muscle bulk,tone, power   Medications reviewed:  Scheduled Meds:  insulin  aspart  0-15 Units Subcutaneous TID WC   lisinopril   20 mg Oral QPM   loratadine   10 mg Oral Daily   omega-3 acid ethyl esters  2 capsule Oral BID   rosuvastatin   40 mg Oral QPM   sodium chloride  flush  3 mL Intravenous Q12H   traZODone   50 mg Oral QHS   Continuous Infusions:   ceFAZolin  (ANCEF ) IV     Diet: Diet Order             Diet NPO time specified  Diet effective  midnight                    Data Reviewed: I have personally reviewed following labs and imaging studies ( see epic result tab) CBC: Recent Labs  Lab 02/14/24 1345 02/15/24 0706  WBC 11.1* 10.2  NEUTROABS 8.3*  --   HGB 10.7* 10.6*  HCT 32.5* 31.6*  MCV 94.5 94.3  PLT 317 283   CMP: Recent Labs  Lab 02/14/24 1207 02/15/24 0706  NA 138 140  K 4.0 5.1  CL 105 101  CO2 21* 29  GLUCOSE 110* 117*  BUN 18 12  CREATININE 1.10* 0.90  CALCIUM  8.3* 9.3   GFR: Estimated Creatinine Clearance: 61.8 mL/min (by C-G formula based on SCr of 0.9 mg/dL). Recent Labs  Lab 02/14/24 1207 02/15/24 0706  AST 24 14*  ALT 30 26  ALKPHOS 40 39  BILITOT 0.8 0.7  PROT 5.3* 5.5*  ALBUMIN  2.8* 2.9*   No results for input(s): LIPASE, AMYLASE in the last 168 hours. No results for input(s): AMMONIA in the last 168 hours. Coagulation Profile: No results for input(s): INR, PROTIME in the last 168 hours. Unresulted Labs (From admission, onward)     Start     Ordered   02/18/24 0500  Basic metabolic panel with GFR  Daily,   R     Question:  Specimen collection method  Answer:  Lab=Lab collect   02/17/24 1035   02/18/24 0500  CBC  Daily,   R     Question:  Specimen collection method  Answer:  Lab=Lab collect   02/17/24 1035   02/17/24 1036  Basic metabolic panel  ONCE - URGENT,   URGENT       Question:  Specimen collection method  Answer:  Lab=Lab collect   02/17/24 1035   02/17/24 1035  CBC  ONCE - URGENT,   URGENT       Question:  Specimen collection method  Answer:  Lab=Lab collect   02/17/24 1035   02/14/24 1207  CBC with Differential/Platelet  Once,   STAT        02/14/24 1206           Antimicrobials/Microbiology: Anti-infectives (From admission, onward)    Start     Dose/Rate Route Frequency Ordered Stop   02/17/24 1015  ceFAZolin  (ANCEF ) IVPB 2g/100 mL premix        2 g 200 mL/hr over 30 Minutes Intravenous  Once 02/17/24 0919           Component Value  Date/Time   SDES BLOOD RIGHT HAND 03/13/2019 0038   SPECREQUEST  03/13/2019 0038    BOTTLES DRAWN AEROBIC AND ANAEROBIC Blood Culture adequate volume   CULT  03/13/2019 0038    NO GROWTH 5 DAYS Performed at [facility], [address], [place], [place] [address]    REPTSTATUS 03/18/2019 FINAL 03/13/2019 0038    Procedures: Procedure(s) (LRB): WOUND EXPLORATION (N/A) REMOVAL, HARDWARE (N/A)   Mennie LAMY, MD [facility] 02/17/2024, 10:35 AM

## 2024-02-17 NOTE — Anesthesia Procedure Notes (Signed)
 Procedure Name: Intubation Date/Time: 02/17/2024 6:21 PM  Performed by: Khani Paino A, CRNAPre-anesthesia Checklist: Patient identified, Emergency Drugs available, Suction available and Patient being monitored Patient Re-evaluated:Patient Re-evaluated prior to induction Oxygen Delivery Method: Circle System Utilized Preoxygenation: Pre-oxygenation with 100% oxygen Induction Type: IV induction Ventilation: Mask ventilation without difficulty Laryngoscope Size: Mac and 3 Grade View: Grade I Tube type: Oral Tube size: 7.5 mm Number of attempts: 1 Airway Equipment and Method: Stylet and Oral airway Placement Confirmation: ETT inserted through vocal cords under direct vision, positive ETCO2 and breath sounds checked- equal and bilateral Secured at: 22 cm Tube secured with: Tape Dental Injury: Teeth and Oropharynx as per pre-operative assessment

## 2024-02-18 ENCOUNTER — Encounter (HOSPITAL_COMMUNITY): Payer: Self-pay

## 2024-02-18 DIAGNOSIS — R291 Meningismus: Secondary | ICD-10-CM | POA: Diagnosis not present

## 2024-02-18 LAB — BASIC METABOLIC PANEL WITH GFR
Anion gap: 12 (ref 5–15)
BUN: 14 mg/dL (ref 8–23)
CO2: 24 mmol/L (ref 22–32)
Calcium: 9 mg/dL (ref 8.9–10.3)
Chloride: 102 mmol/L (ref 98–111)
Creatinine, Ser: 0.97 mg/dL (ref 0.44–1.00)
GFR, Estimated: >60 mL/min (ref >60)
Glucose, Bld: 147 mg/dL — ABNORMAL HIGH (ref 70–99)
Potassium: 4.6 mmol/L (ref 3.5–5.1)
Sodium: 138 mmol/L (ref 135–145)

## 2024-02-18 LAB — CBC
HCT: 34.5 % — ABNORMAL LOW (ref 36.0–46.0)
Hemoglobin: 12 g/dL (ref 12.0–15.0)
MCH: 31.8 pg (ref 26.0–34.0)
MCHC: 34.8 g/dL (ref 30.0–36.0)
MCV: 91.5 fL (ref 80.0–100.0)
Platelets: 363 K/uL (ref 150–400)
RBC: 3.77 MIL/uL — ABNORMAL LOW (ref 3.87–5.11)
RDW: 12.8 % (ref 11.5–15.5)
WBC: 14.2 K/uL — ABNORMAL HIGH (ref 4.0–10.5)
nRBC: 0 % (ref 0.0–0.2)

## 2024-02-18 LAB — GLUCOSE, CAPILLARY
Glucose-Capillary: 191 mg/dL — ABNORMAL HIGH (ref 70–99)
Glucose-Capillary: 102 mg/dL — ABNORMAL HIGH (ref 70–99)
Glucose-Capillary: 194 mg/dL — ABNORMAL HIGH (ref 70–99)
Glucose-Capillary: 140 mg/dL — ABNORMAL HIGH (ref 70–99)

## 2024-02-18 MED ORDER — SENNOSIDES-DOCUSATE SODIUM 8.6-50 MG PO TABS
1.0000 | ORAL_TABLET | Freq: Two times a day (BID) | ORAL | Status: DC
  Administered 2024-02-18 – 2024-02-24 (×9): 1 via ORAL
  Filled 2024-02-18 (×9): qty 1

## 2024-02-18 MED ORDER — FLEET ENEMA RE ENEM
1.0000 | ENEMA | Freq: Every day | RECTAL | Status: DC | PRN
  Administered 2024-02-19: 1 via RECTAL
  Filled 2024-02-18: qty 1

## 2024-02-18 MED ORDER — DOCUSATE SODIUM 100 MG PO CAPS
100.0000 mg | ORAL_CAPSULE | Freq: Two times a day (BID) | ORAL | Status: DC
  Administered 2024-02-18 – 2024-02-24 (×9): 100 mg via ORAL
  Filled 2024-02-18 (×9): qty 1

## 2024-02-18 MED ORDER — SENNA 8.6 MG PO TABS
1.0000 | ORAL_TABLET | Freq: Every day | ORAL | Status: DC | PRN
  Administered 2024-02-19: 8.6 mg via ORAL
  Filled 2024-02-18: qty 1

## 2024-02-18 NOTE — Progress Notes (Signed)
 Pt was doing so well sitting up at 60 degrees as well as on the edge of the bed. C/o of a severe headache , returned back yo lying position, also medicated with fioricet 

## 2024-02-18 NOTE — Care Management Important Message (Signed)
 Important Message  Patient Details  Name: Diane Watkins MRN: [id number] Date of Birth: 1951-04-06   Important Message Given:  Yes - Medicare IM     Jon Cruel 02/18/2024, 2:14 PM

## 2024-02-18 NOTE — Progress Notes (Signed)
 Subjective: Procedure(s) (LRB): WOUND EXPLORATION (N/A) REMOVAL, HARDWARE (N/A) 1 Day Post-Op  The patient is resting comfortably in bed.  She denies any headaches.  She reports some degree of back discomfort but is tolerable for her.  No pain in the buttocks or down to the legs.  Objective: Vital signs in last 24 hours: Temp:  [97.6 F (36.4 C)-98.7 F (37.1 C)] 97.7 F (36.5 C) (09/04 0616) Pulse Rate:  [84-103] 94 (09/04 0616) Resp:  [10-25] 18 (09/04 0616) BP: (138-176)/(66-97) 154/77 (09/04 0616) SpO2:  [93 %-100 %] 94 % (09/04 0616)  Intake/Output from previous day: 09/03 0701 - 09/04 0700 In: 620.8 [I.V.:620.8] Out: 320 [Urine:300; Blood:20]  Labs: Recent Labs    02/17/24 1151 02/18/24 0228  WBC 13.6* 14.2*  RBC 3.70* 3.77*  HCT 34.1* 34.5*  PLT 365 363   Recent Labs    02/17/24 1151 02/18/24 0228  NA 136 138  K 3.9 4.6  CL 101 102  CO2 24 24  BUN 15 14  CREATININE 0.98 0.97  GLUCOSE 125* 147*  CALCIUM  8.9 9.0   No results for input(s): LABPT, INR in the last 72 hours.  Physical Exam: Well-appearing woman in no acute distress Body mass index is 37.49 kg/m.    Sensation     Right      Left  L3   Intact     Intact L4   Intact     Intact L5    Intact     Intact S1   Intact     Intact    Motor Exam     Right     Left Iliopsoas  5/5     5/5  Quad   5/5     5/5  Hamstring  5/5     5/5  Tibials Anterior 5/5     5/5  EHL   5/5     5/5  Gastrocs  5/5     5/5      Assessment/Plan: The patient is doing better this morning.  She denies any headaches.  She is overall much more comfortable.  At this point we will continue bedrest flat through noon.  Afternoon we will slowly elevate the head of bed.  Will start 3 degrees then increase to 10 degrees every hour.  If she develops a headache, will return to head of bed flat.  If she gets past 60 degrees then she can be head of bed ad lib.  Continue with her current pain regimen.  Continue current  bowel regimen.  Continue teds and SCDs prophylaxis.  With regards to her abdominal pain, she is currently not feeling any but she has had quite a bit of gas.  She has had no bowel movement since Saturday.  Will add more promotility agents to her bowel regimen  With regards to DVT prophylaxis, will continue with teds and SCDs.  Hopefully can mobilize her which will minimize the DVT risk but if she does have more prolonged immobilization may consider chemoprophylaxis  Her disposition will based on how she progresses with therapy and if she is we will get up with her out headaches  Cordella Rhein, MD, MS [facility] [phone]

## 2024-02-18 NOTE — Progress Notes (Signed)
 PROGRESS NOTE Diane Watkins  FMW:[id number] DOB: 1950-10-10 DOA: 02/14/2024 PCP: Cook, Jayce G, DO  Brief Narrative/Hospital Course: Diane Watkins is a 73 y.o. year old female with PMH of  HTN, HLD, DM2, GERD, and a recent transforaminal lumbar decompression and fusion at L4-5 on 02/08/2024 with discharge from the hospital on 02/10/2024. She reported a headache at the time of her discharge but clinical symptoms were felt to most likely represent a tension headache. She returned to the ER 8/31 with persistent unrelenting severe generalized headache.  She had stopped taking her prescribed narcotic pain medication due to itching.  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 Patient admitted for persistent headache, orthospine surgery consulted Patient completed MRI showing persistent leak 9/3 after second opinion by Dr. 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 Joshua underwent exploration of the lumbar wound with closure of dural defect Cont pain managed with PRN Tylenol /Fioricet /Flexeril /Dilaudid  for pain control Continue plan for orthospine -advising bedrest till noon and then slowly elevate 3 degree then 10 degree q 1  hr If she develops headache return to head of bed flat If she gets past 60 degrees and can have a head of bed ad lib Holding chemical prophylaxis for now per surgery team   Mild postoperative anemia hb stable. Monitor   Anemia of chronic illness: hemoglobin stable around 10 g   DM2: Well-controlled, continue SSI Recent Labs  Lab 02/17/24 1450 02/17/24 1644 02/17/24 1952 02/17/24 2137 02/18/24 0802  GLUCAP 120* 116* 128* 146* 191*     HTN: Stable, Continue home lisinopril .   HLD: Continue home atorvastatin  and Lovaza .  Constipation: Last BM 8/30.  Intensify bowel regimen  Class II Obesity w/ Body mass index is 37.49 kg/m.: Will benefit with PCP follow-up, weight loss,healthy lifestyle and outpatient sleep eval.  PT OT: Pending for now refer to surgery  DVT prophylaxis: SCD's Start: 02/17/24 2112 SCDs Start: 02/14/24 1503 unable to use chemical prophylaxis postop Code Status:   Code Status: Full Code Family Communication: plan of care discussed with patient/sister at bedside. Patient status is: Remains hospitalized because of severity of illness Level of care: Med-Surg   Dispo: The patient is from: home            Anticipated disposition: TBD Objective: Vitals last 24 hrs: Vitals:   02/17/24 2045 02/17/24 2101 02/18/24 0616 02/18/24 0810  BP: (!) 152/71 (!) 143/72 (!) 154/77 136/66  Pulse: 84 85 94 94  Resp: 16 18 18 18   Temp: 97.6 F (36.4 C) 97.6 F (36.4 C) 97.7 F (36.5 C) 98.9 F (37.2 C)  TempSrc:  Oral Oral Oral  SpO2: 96% 94% 94% 95%  Weight:      Height:        Physical Examination: General exam: aaox3, pleasant  HEENT:Oral mucosa moist, Ear/Nose WNL grossly Respiratory system: B/l clear BS,no use of accessory muscle Cardiovascular system: S1 & S2 +, No JVD. Gastrointestinal system: Abdomen soft,NT,ND, BS+ Nervous System: Alert, awake, moving all extremities, sensation intact in legs and moving well Extremities: LE edema neg, distal  extremities warm.  Skin: No rashes,no icterus. MSK: Normal muscle bulk,tone, power   Medications reviewed:  Scheduled Meds:  docusate sodium   100 mg Oral BID   insulin  aspart  0-15 Units Subcutaneous TID WC   lisinopril   20 mg Oral QPM   loratadine   10 mg Oral Daily   omega-3 acid ethyl esters  2 capsule Oral BID   rosuvastatin   40 mg Oral QPM   senna-docusate  1 tablet Oral BID   sodium chloride  flush  3 mL Intravenous Q12H   traZODone   50 mg Oral QHS   Continuous Infusions:  sodium chloride  250 mL (02/17/24 2146)   sodium chloride  40 mL/hr at 02/17/24 2144   Diet: Diet Order             Diet regular Room service appropriate? Yes; Fluid consistency: Thin  Diet effective now                    Data Reviewed: I have personally reviewed following labs and imaging studies ( see epic result tab) CBC: Recent Labs  Lab 02/14/24 1345 02/15/24 0706 02/17/24 1151 02/18/24 0228  WBC 11.1* 10.2 13.6* 14.2*  NEUTROABS 8.3*  --   --   --   HGB 10.7* 10.6* 11.7* 12.0  HCT 32.5* 31.6* 34.1* 34.5*  MCV 94.5 94.3 92.2 91.5  PLT 317 283 365 363   CMP: Recent Labs  Lab 02/14/24 1207 02/15/24 0706 02/17/24 1151 02/18/24 0228  NA 138 140 136 138  K 4.0 5.1 3.9 4.6  CL 105 101 101 102  CO2 21* 29 24 24   GLUCOSE 110* 117* 125* 147*  BUN 18 12 15 14   CREATININE 1.10* 0.90 0.98 0.97  CALCIUM  8.3* 9.3 8.9 9.0   GFR: Estimated Creatinine Clearance: 57.3 mL/min (by C-G formula based on SCr of 0.97 mg/dL). Recent Labs  Lab 02/14/24 1207 02/15/24 0706  AST 24 14*  ALT 30 26  ALKPHOS 40 39  BILITOT 0.8 0.7  PROT 5.3* 5.5*  ALBUMIN  2.8* 2.9*   No results for input(s): LIPASE, AMYLASE in the last 168 hours. No results for input(s): AMMONIA in the last 168 hours. Coagulation Profile: No results for input(s): INR, PROTIME in the last 168 hours. Unresulted Labs (From admission, onward)     Start     Ordered   02/18/24 0500  Basic metabolic panel with GFR  Daily,    R     Question:  Specimen collection method  Answer:  Lab=Lab collect   02/17/24 1035   02/18/24 0500  CBC  Daily,   R     Question:  Specimen collection method  Answer:  Lab=Lab collect   02/17/24 1035   02/14/24 1207  CBC with Differential/Platelet  Once,   STAT        02/14/24 1206           Antimicrobials/Microbiology: Anti-infectives (From admission, onward)    Start     Dose/Rate Route Frequency Ordered Stop   02/18/24 0000  ceFAZolin  (ANCEF ) IVPB 2g/100 mL premix        2 g 200 mL/hr over 30 Minutes Intravenous Every 6 hours 02/17/24 2112 02/18/24 0654   02/17/24 1015  ceFAZolin  (ANCEF ) IVPB 2g/100 mL premix        2 g 200 mL/hr over 30 Minutes Intravenous  Once 02/17/24 0919 02/17/24 1828         Component Value Date/Time   SDES BLOOD RIGHT HAND 03/13/2019 0038   SPECREQUEST  03/13/2019 0038    BOTTLES DRAWN AEROBIC AND ANAEROBIC Blood Culture adequate volume   CULT  03/13/2019 0038    NO GROWTH 5 DAYS Performed at [facility], [address] [place], [place] [address]    REPTSTATUS 03/18/2019 FINAL 03/13/2019 0038  Procedures: Procedure(s) (LRB): WOUND EXPLORATION (N/A) REMOVAL, HARDWARE (N/A) Mennie LAMY, MD [facility] 02/18/2024, 10:50 AM

## 2024-02-19 ENCOUNTER — Inpatient Hospital Stay (HOSPITAL_COMMUNITY)

## 2024-02-19 ENCOUNTER — Ambulatory Visit: Payer: PPO

## 2024-02-19 DIAGNOSIS — R291 Meningismus: Secondary | ICD-10-CM | POA: Diagnosis not present

## 2024-02-19 DIAGNOSIS — G9600 Cerebrospinal fluid leak, unspecified: Secondary | ICD-10-CM

## 2024-02-19 LAB — URINALYSIS, ROUTINE W REFLEX MICROSCOPIC
Bilirubin Urine: NEGATIVE
Glucose, UA: 50 mg/dL — AB
Hgb urine dipstick: NEGATIVE
Ketones, ur: NEGATIVE mg/dL
Leukocytes,Ua: NEGATIVE
Nitrite: NEGATIVE
Protein, ur: NEGATIVE mg/dL
Specific Gravity, Urine: 1.02 (ref 1.005–1.030)
pH: 5 (ref 5.0–8.0)

## 2024-02-19 LAB — BASIC METABOLIC PANEL WITH GFR
Anion gap: 14 (ref 5–15)
BUN: 22 mg/dL (ref 8–23)
CO2: 23 mmol/L (ref 22–32)
Calcium: 8.7 mg/dL — ABNORMAL LOW (ref 8.9–10.3)
Chloride: 103 mmol/L (ref 98–111)
Creatinine, Ser: 0.89 mg/dL (ref 0.44–1.00)
GFR, Estimated: >60 mL/min (ref >60)
Glucose, Bld: 146 mg/dL — ABNORMAL HIGH (ref 70–99)
Potassium: 4.4 mmol/L (ref 3.5–5.1)
Sodium: 140 mmol/L (ref 135–145)

## 2024-02-19 LAB — AMMONIA: Ammonia: 32 umol/L (ref 9–35)

## 2024-02-19 LAB — CBC
HCT: 30.7 % — ABNORMAL LOW (ref 36.0–46.0)
Hemoglobin: 10.6 g/dL — ABNORMAL LOW (ref 12.0–15.0)
MCH: 31.7 pg (ref 26.0–34.0)
MCHC: 34.5 g/dL (ref 30.0–36.0)
MCV: 91.9 fL (ref 80.0–100.0)
Platelets: 337 K/uL (ref 150–400)
RBC: 3.34 MIL/uL — ABNORMAL LOW (ref 3.87–5.11)
RDW: 13.2 % (ref 11.5–15.5)
WBC: 14.4 K/uL — ABNORMAL HIGH (ref 4.0–10.5)
nRBC: 0 % (ref 0.0–0.2)

## 2024-02-19 LAB — TSH: TSH: 4.775 u[IU]/mL — ABNORMAL HIGH (ref 0.350–4.500)

## 2024-02-19 LAB — GLUCOSE, CAPILLARY
Glucose-Capillary: 170 mg/dL — ABNORMAL HIGH (ref 70–99)
Glucose-Capillary: 96 mg/dL (ref 70–99)
Glucose-Capillary: 138 mg/dL — ABNORMAL HIGH (ref 70–99)
Glucose-Capillary: 114 mg/dL — ABNORMAL HIGH (ref 70–99)

## 2024-02-19 LAB — VITAMIN B12: Vitamin B-12: 175 pg/mL — ABNORMAL LOW (ref 180–914)

## 2024-02-19 MED ORDER — GADOBUTROL 1 MMOL/ML IV SOLN
9.0000 mL | Freq: Once | INTRAVENOUS | Status: AC | PRN
  Administered 2024-02-19: 9 mL via INTRAVENOUS

## 2024-02-19 MED ORDER — CEPHALEXIN 500 MG PO CAPS
500.0000 mg | ORAL_CAPSULE | Freq: Four times a day (QID) | ORAL | Status: DC
  Administered 2024-02-19 – 2024-02-21 (×7): 500 mg via ORAL
  Filled 2024-02-19 (×10): qty 1

## 2024-02-19 MED ORDER — BISACODYL 10 MG RE SUPP
10.0000 mg | Freq: Once | RECTAL | Status: AC
  Administered 2024-02-19: 10 mg via RECTAL
  Filled 2024-02-19: qty 1

## 2024-02-19 MED ORDER — SODIUM CHLORIDE 0.9 % IV BOLUS
500.0000 mL | Freq: Once | INTRAVENOUS | Status: AC
  Administered 2024-02-19: 500 mL via INTRAVENOUS

## 2024-02-19 MED ORDER — SODIUM CHLORIDE 0.9 % IV SOLN: INTRAVENOUS | Status: DC

## 2024-02-19 MED ORDER — CYANOCOBALAMIN 1000 MCG/ML IJ SOLN
1000.0000 ug | Freq: Once | INTRAMUSCULAR | Status: AC
  Administered 2024-02-19: 1000 ug via SUBCUTANEOUS
  Filled 2024-02-19: qty 1

## 2024-02-19 MED ORDER — VITAMIN B-12 1000 MCG PO TABS
1000.0000 ug | ORAL_TABLET | Freq: Every day | ORAL | Status: DC
  Administered 2024-02-20 – 2024-02-24 (×5): 1000 ug via ORAL
  Filled 2024-02-19 (×5): qty 1

## 2024-02-19 MED ORDER — KETOROLAC TROMETHAMINE 15 MG/ML IJ SOLN
15.0000 mg | Freq: Four times a day (QID) | INTRAMUSCULAR | Status: AC | PRN
  Administered 2024-02-19 – 2024-02-24 (×10): 15 mg via INTRAVENOUS
  Filled 2024-02-19 (×11): qty 1

## 2024-02-19 NOTE — Progress Notes (Addendum)
 OT Cancellation Note  Patient Details Name: Diane Watkins MRN: [id number] DOB: Jul 07, 1950   Cancelled Treatment:    Reason Eval/Treat Not Completed: (P) Medical issues which prohibited therapy, Having headaches when sitting up and confusion, per MD will hold off until medically ready, will return as able.  [name] 02/19/2024, 10:55 AM

## 2024-02-19 NOTE — Progress Notes (Signed)
 PT Cancellation Note  Patient Details Name: Diane Watkins MRN: [id number] DOB: 1950/07/17   Cancelled Treatment:    Reason Eval/Treat Not Completed: Patient not medically ready Per MD, pt with ongoing confusion & c/o HA when sitting up. Planning for nursing to try to sit pt upright again today, wait until confusion clears. Will f/u with nurse & plan to see pt this PM or re-attempt tomorrow when pt medically ready/able to tolerate therapies.   Diane Watkins, PT, DPT 02/19/24, 9:14 AM   Diane Watkins 02/19/2024, 9:14 AM

## 2024-02-19 NOTE — Progress Notes (Signed)
 Subjective: Procedure(s) (LRB): WOUND EXPLORATION (N/A) REMOVAL, HARDWARE (N/A) 2 Days Post-Op  The patient was able to sit up yesterday to approximately 60 degrees.  She tolerated this well.  After being up at 6 degrees for approximately 20 to 30 minutes she developed a headache.  She returned to about 40 degrees and per report has no headache since.  Currently is somewhat confused.  According to the nursing staff as well as the patient's family member who is with her this seemed to happen after some Dilaudid .  She is currently alert but does not respond appropriately to commands.  Objective: Vital signs in last 24 hours: Temp:  [97.5 F (36.4 C)-98.9 F (37.2 C)] 97.5 F (36.4 C) (09/05 0651) Pulse Rate:  [62-99] 62 (09/05 0651) Resp:  [16-18] 18 (09/05 0651) BP: (126-153)/(66-82) 129/77 (09/05 0651) SpO2:  [95 %-97 %] 95 % (09/05 0651)  Intake/Output from previous day: 09/04 0701 - 09/05 0700 In: 833.9 [P.O.:240; I.V.:593.9] Out: 800 [Urine:800]  Labs: Recent Labs    02/18/24 0228 02/19/24 0224  WBC 14.2* 14.4*  RBC 3.77* 3.34*  HCT 34.5* 30.7*  PLT 363 337   Recent Labs    02/18/24 0228 02/19/24 0224  NA 138 140  K 4.6 4.4  CL 102 103  CO2 24 23  BUN 14 22  CREATININE 0.97 0.89  GLUCOSE 147* 146*  CALCIUM  9.0 8.7*   No results for input(s): LABPT, INR in the last 72 hours.  Physical Exam: Patient is alert but not oriented.  She grossly moves her bilateral upper and lower extremities. Body mass index is 37.49 kg/m.  Incision C/D/I      Assessment/Plan: Returns for headaches, the patient had been doing better yesterday.  She is able to tolerate 60 degrees with a headache after about half an hour but not nearly as severe as it had been previously.  Currently the biggest concern is her confusion.  This seems to be related to the Dilaudid  that she received.  We will try to hold back on this.  I will add back in some Toradol .  Her creatinine is normal and  her kidney function appears to be stable.  Will see if the confusion resolves once the Dilaudid  has worn off.  If not, may consider CT of her head or urinalysis.  Once her confusion has resolved, we can attempt to set her up again.  If she is able to tolerate 60 degrees between mobilizing with therapy after this.  There is a small area of phlebitis at the site of an IV on her left hand.  Will treat this with hot compresses and rest.  I will also start some antibiotics just as a safety measure.  This was discussed with the patient's family members.  Will turn later on today to reassess the patient.  Diane Rhein, MD, [facility] [phone]

## 2024-02-19 NOTE — Consult Note (Addendum)
 NEUROLOGY CONSULT NOTE   Date of service: February 19, 2024 Patient Name: Diane Watkins MRN:  [id number] DOB:  Oct 28, 1950 Chief Complaint: Headaches Requesting Provider: Christobal Guadalajara, MD  History of Present Illness  Diane Watkins is a 73 y.o. female with hx of hypertension, hyperlipidemia, type 2 diabetes, GERD, recent transforaminal lumbar decompression and fusion at L4-5 on 02/08/2024.  Patient initially discharged on 02/10/2024 and returned back to the emergency department on 08/31 with headaches.  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  Patient had MRI Lumbar spine that confirmed a CSF leak which is likely the etiology of her headache.  Patient went exploration of lumbar wound with closure of dural defect on 02/17/2024.  Patient continued to have some intermittent confusion and altered mental status with headache, and requested neurology evaluation.  Patient examined at bedside this morning.  Patient's daughter in law, who is a nurse at bedside gives most of the history as patient is having a little bit of a headache.  The patient has been having headaches since her surgery.  They are concerned for another CSF leak.  The headaches are described to be starting in the front of her head and moved back to the back of her head.  The onset when she sits up.  They resolve when she lays back down.  They are intense and debilitating.  She denies any phonophobia, photophobia, lacrimation, or rhinorrhea with the headaches.  They are not unilateral.  No other triggers.  Patient is emotional during exam given that she has had debilitating headaches that has kept her from working outside in her garden.  NIHSS components Score: Comment  1a Level of Conscious 0[x]  1[]  2[]  3[]      1b LOC Questions 0[x]  1[]  2[]       1c LOC Commands 0[x]  1[]  2[]       2  Best Gaze 0[x]  1[]  2[]       3 Visual 0[x]  1[]  2[]  3[]      4 Facial Palsy 0[x]  1[]  2[]  3[]      5a Motor Arm - left 0[x]  1[]  2[]  3[]  4[]  UN[]    5b Motor Arm - Right 0[x]  1[]  2[]  3[]  4[]  UN[]    6a Motor Leg - Left 0[x]  1[]  2[]  3[]  4[]  UN[]    6b Motor Leg - Right 0[x]  1[]  2[]  3[]  4[]  UN[]    7 Limb Ataxia 0[x]  1[]  2[]  UN[]      8 Sensory 0[x]  1[]  2[]  UN[]      9 Best Language 0[x]  1[]  2[]  3[]      10 Dysarthria 0[x]  1[]  2[]  UN[]      11 Extinct. and Inattention 0[x]  1[]  2[]       TOTAL:       ROS  Neuro: Patient denies any weakness, numbness, tingling, trouble speaking, trouble findings words, swallowing, but does endorse having headaches  Past History   Past Medical History:  Diagnosis Date   Arthritis    Diabetes mellitus without complication (HCC)    High cholesterol    History of hiatal hernia    Hyperlipidemia    Hypertension     Past Surgical History:  Procedure Laterality Date   BIOPSY  07/24/2021   Procedure: BIOPSY;  Surgeon: Golda Claudis PENNER, MD;  Location: [facility];  Service: Endoscopy;;  polyp   CATARACT EXTRACTION W/PHACO Left 03/29/2018   Procedure: CATARACT EXTRACTION PHACO AND INTRAOCULAR LENS PLACEMENT (  IOC);  Surgeon: Perley Hamilton, MD;  Location: [facility];  Service: Ophthalmology;  Laterality: Left;  CDE: 10.13   CHOLECYSTECTOMY     COLONOSCOPY WITH PROPOFOL  N/A 07/24/2021   Procedure: COLONOSCOPY WITH PROPOFOL ;  Surgeon: Golda Claudis PENNER, MD;  Location: [facility];  Service: Endoscopy;  Laterality: N/A;   ESOPHAGOGASTRODUODENOSCOPY (EGD) WITH PROPOFOL  N/A 07/24/2021   Procedure: ESOPHAGOGASTRODUODENOSCOPY (EGD) WITH PROPOFOL ;  Surgeon: Golda Claudis PENNER, MD;  Location: [facility];  Service: Endoscopy;  Laterality: N/A;   HARDWARE REMOVAL N/A 02/17/2024   Procedure: REMOVAL, HARDWARE;  Surgeon: Reyne Cordella SQUIBB, MD;  Location: [facility];  Service: Orthopedics;  Laterality: N/A;   INCISIONAL HERNIA REPAIR N/A 09/04/2021   Procedure: HERNIA REPAIR INCISIONAL W/MESH;   Surgeon: Mavis Anes, MD;  Location: [facility];  Service: General;  Laterality: N/A;   POLYPECTOMY  07/24/2021   Procedure: POLYPECTOMY;  Surgeon: Golda Claudis PENNER, MD;  Location: [facility];  Service: Endoscopy;;   TRANSFORAMINAL LUMBAR INTERBODY FUSION (TLIF) WITH PEDICLE SCREW FIXATION 1 LEVEL N/A 02/08/2024   Procedure: TRANSFORAMINAL LUMBAR INTERBODY FUSION (TLIF) WITH PEDICLE SCREW FIXATION 1 LEVEL;  Surgeon: Reyne Cordella SQUIBB, MD;  Location: [facility];  Service: Orthopedics;  Laterality: N/A;  Transforaminal lumber interbody fusion with pedicle screw fixation, lumbar 4-lumbar 5   TUBAL LIGATION     WOUND EXPLORATION N/A 02/17/2024   Procedure: WOUND EXPLORATION;  Surgeon: Reyne Cordella SQUIBB, MD;  Location: [facility];  Service: Orthopedics;  Laterality: N/A;  Exploration of wound, Repair of Spinal fluid Leak Lumbar four-five  Removal of screws and Replacement of same screws    Family History: Family History  Problem Relation Age of Onset   Stroke Mother    Alcohol  abuse Father    Heart disease Father 34       MI    Social History  reports that she has never smoked. She has been exposed to tobacco smoke. She has never used smokeless tobacco. She reports that she does not currently use alcohol . She reports that she does not use drugs.  Allergies  Allergen Reactions   Amoxil [Amoxicillin] Other (See Comments)    Bad headaches Has patient had a PCN reaction causing immediate rash, facial/tongue/throat swelling, SOB or lightheadedness with hypotension: No Has patient had a PCN reaction causing severe rash involving mucus membranes or skin necrosis: No Has patient had a PCN reaction that required hospitalization: No Has patient had a PCN reaction occurring within the last 10 years: No If all of the above answers are NO, then may proceed with Cephalosporin use.    Oxycodone -Acetaminophen  Itching    Medications   Current Facility-Administered Medications:    acetaminophen  (TYLENOL ) tablet  650 mg, 650 mg, Oral, Q6H PRN, 650 mg at 02/15/24 0748 **OR** [DISCONTINUED] acetaminophen  (TYLENOL ) suppository 650 mg, 650 mg, Rectal, Q6H PRN, Melvin, Alexander B, MD   ALPRAZolam  (XANAX ) tablet 0.25 mg, 0.25 mg, Oral, TID PRN, Kc, Ramesh, MD   bisacodyl  (DULCOLAX) suppository 10 mg, 10 mg, Rectal, Once, Kc, Ramesh, MD   butalbital -acetaminophen -caffeine  (FIORICET ) 50-325-40 MG per tablet 2 tablet, 2 tablet, Oral, Q6H PRN, Reyne Cordella SQUIBB, MD, 2 tablet at 02/19/24 0401   cephALEXin  (KEFLEX ) capsule 500 mg, 500 mg, Oral, Q6H, Reyne Cordella SQUIBB, MD, 500 mg at 02/19/24 9051   cyclobenzaprine  (FLEXERIL ) tablet 10 mg, 10 mg, Oral, TID PRN, Melvin, Alexander B, MD, 10 mg at 02/15/24 2346   diphenhydrAMINE  (BENADRYL ) capsule 25 mg, 25 mg, Oral, Q6H PRN, Reyne Cordella SQUIBB,  MD   docusate sodium  (COLACE) capsule 100 mg, 100 mg, Oral, BID, Gebauer, Gregory P, MD, 100 mg at 02/19/24 9072   insulin  aspart (novoLOG ) injection 0-15 Units, 0-15 Units, Subcutaneous, TID WC, Melvin, Alexander B, MD, 3 Units at 02/19/24 9071   ketorolac  (TORADOL ) 15 MG/ML injection 15 mg, 15 mg, Intravenous, Q6H PRN, Reyne Cordella SQUIBB, MD, 15 mg at 02/19/24 9075   lisinopril  (ZESTRIL ) tablet 20 mg, 20 mg, Oral, QPM, Melvin, Alexander B, MD, 20 mg at 02/18/24 1820   loratadine  (CLARITIN ) tablet 10 mg, 10 mg, Oral, Daily, Danton Reyes DASEN, MD, 10 mg at 02/19/24 9072   omega-3 acid ethyl esters (LOVAZA ) capsule 2 g, 2 capsule, Oral, BID, Melvin, Alexander B, MD, 2 g at 02/19/24 9072   ondansetron  (ZOFRAN ) injection 4 mg, 4 mg, Intravenous, Q6H PRN, Danton Reyes DASEN, MD, 4 mg at 02/19/24 9078   polyethylene glycol (MIRALAX  / GLYCOLAX ) packet 17 g, 17 g, Oral, Daily PRN, Melvin, Alexander B, MD   rosuvastatin  (CRESTOR ) tablet 40 mg, 40 mg, Oral, QPM, Melvin, Alexander B, MD, 40 mg at 02/18/24 1820   senna (SENOKOT) tablet 8.6 mg, 1 tablet, Oral, Daily PRN, Reyne Cordella SQUIBB, MD, 8.6 mg at 02/19/24 9072   senna-docusate  (Senokot-S) tablet 1 tablet, 1 tablet, Oral, BID, Kc, Ramesh, MD, 1 tablet at 02/19/24 9071   sodium chloride  flush (NS) 0.9 % injection 3 mL, 3 mL, Intravenous, Q12H, Reyne Cordella SQUIBB, MD, 3 mL at 02/19/24 0932   sodium chloride  flush (NS) 0.9 % injection 3 mL, 3 mL, Intravenous, PRN, Reyne Cordella SQUIBB, MD, 3 mL at 02/17/24 2137   sodium phosphate  (FLEET) enema 1 enema, 1 enema, Rectal, Daily PRN, Reyne Cordella SQUIBB, MD   traMADol  (ULTRAM ) tablet 50 mg, 50 mg, Oral, Q6H PRN, Melvin, Alexander B, MD, 50 mg at 02/19/24 9360   traZODone  (DESYREL ) tablet 50 mg, 50 mg, Oral, QHS, Danton Reyes DASEN, MD, 50 mg at 02/17/24 2137  Vitals   Vitals:   02/18/24 1618 02/18/24 2126 02/19/24 0651 02/19/24 0901  BP: 126/82 (!) 153/71 129/77 (!) 140/82  Pulse: 97 99 62 96  Resp: 18 16 18 17   Temp: 97.9 F (36.6 C) 98.3 F (36.8 C) (!) 97.5 F (36.4 C) 98.4 F (36.9 C)  TempSrc: Oral Oral Oral Oral  SpO2: 96% 97% 95% 94%  Weight:      Height:        Body mass index is 37.49 kg/m.   Physical Exam   Constitutional: Appears well-developed and well-nourished Psych: Affect appropriate to situation, tearful but appropriate Eyes: No scleral injection. HENT: No OP obstruction. Head: Normocephalic. Respiratory: Effort normal, non-labored breathing  Neurologic Examination  Neuro: *MS: A&O x4. Follows multi-step instructions *Speech: no dysarthria or aphasia, able to name and repeat. *CN:    I: Deferred   II,III: PERRLA, VFF by confrontation, optic discs not visualized 2/2 pupillary constriction   III,IV,VI: EOMI w/o nystagmus, no ptosis   V: Sensation intact from V1 to V3 to LT   VII: Eyelid closure was full.  Smile symmetric.   VIII: Hearing intact to voice   IX,X: Voice normal, palate elevates symmetrically    XI: SCM/trap 5/5 bilat   XII: Tongue protrudes midline, no atrophy or fasciculations  *Motor:   Normal bulk.  No tremor, rigidity or bradykinesia. No pronator drift.   Strength:  Dlt Bic Tri WE WrF FgS Gr HF KnF KnE PlF DoF    Left 5 5 5 5  5  5 4 4 5 5 5 5     Right 5 5 5 5 5 5 4 4 5 5 5 5    Motor deficits in the setting of pain, and not true weakness  *Sensory: Sensation intact to gross touch bilaterally *Coordination:  Finger-to-nose, heel-to-shin, rapid alternating motions were intact. *Reflexes:  2+ and symmetric throughout without clonus *Gait: Deferred  NIHSS 1a Level of Conscious.: 0 1b LOC Questions: 0 1c LOC Commands: 0 2 Best Gaze: 0 3 Visual: 0 4 Facial Palsy: 0 5a Motor Arm - left: 0 5b Motor Arm - Right: 0 6a Motor Leg - Left: 0 6b Motor Leg - Right: 0 7 Limb Ataxia: 0 8 Sensory: 0 9 Best Language: 0 10 Dysarthria: 0 11 Extinct. and Inattention: 0  TOTAL: 0  Labs/Imaging/Neurodiagnostic studies   CBC:  Recent Labs  Lab 2024/03/11 1345 02/15/24 0706 02/18/24 0228 02/19/24 0224  WBC 11.1*   < > 14.2* 14.4*  NEUTROABS 8.3*  --   --   --   HGB 10.7*   < > 12.0 10.6*  HCT 32.5*   < > 34.5* 30.7*  MCV 94.5   < > 91.5 91.9  PLT 317   < > 363 337   < > = values in this interval not displayed.   Basic Metabolic Panel:  Lab Results  Component Value Date   NA 140 02/19/2024   K 4.4 02/19/2024   CO2 23 02/19/2024   GLUCOSE 146 (H) 02/19/2024   BUN 22 02/19/2024   CREATININE 0.89 02/19/2024   CALCIUM  8.7 (L) 02/19/2024   GFRNONAA >60 02/19/2024   GFRAA 77 07/24/2020   Lipid Panel:  Lab Results  Component Value Date   LDLCALC 94 08/03/2023   HgbA1c:  Lab Results  Component Value Date   HGBA1C 7.0 (H) 02/03/2024   Urine Drug Screen: No results found for: LABOPIA, COCAINSCRNUR, LABBENZ, AMPHETMU, THCU, LABBARB  Alcohol  Level No results found for: ETH INR No results found for: INR APTT No results found for: APTT AED levels: No results found for: PHENYTOIN, ZONISAMIDE, LAMOTRIGINE, LEVETIRACETA  CT Head without contrast(Personally reviewed): IMPRESSION: 1. Abnormal intracranial lipid material, new  since 2020 and most concentrated in the suprasellar and interpeduncular cisterns, but tiny foci elsewhere in the posterior fossa. Superimposed small new CSF density sub-tentorial posterior fossa effusions also. 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   2. No other complicating features; no significant intracranial mass effect, no ventriculomegaly, no acute intracranial hemorrhage or infarct identified.  MRI Lumbar Spine without contrast IMPRESSION: 1. Recent L4-L5 decompression and fusion. Constellation of imaging findings suggestive of CSF leak: Asymmetric circumferential and heterogeneous subdural space fluid collection tracking from the level of surgery cephalad into the visible lower thoracic spine, compressing the thecal sac in areas. Possible small volume gas also within that collection. Superimposed small volume Right paraspinal muscle fluid collection at the level of posterior hardware - and although postoperative seroma would appear similar - that collection appears to communicate with a larger estimated 117 mL subcutaneous fluid collection in the midline.   2. L5-S1 level appears unchanged from pre operative MRI. And stable underlying lumbar spine degeneration elsewhere.  ASSESSMENT   Diane Watkins is a 73 y.o. female with hx of hypertension, hyperlipidemia, type 2 diabetes, GERD, recent transforaminal lumbar decompression and fusion at L4-5 on 02/08/2024.  Patient initially discharged on 02/10/2024 and returned back to the emergency department on 03-11-24 with headaches.  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  Patient had MRI lumbar spine that confirmed a CSF leak which is likely the etiology of her headache.  Patient went exploration of lumbar wound with closure of dural defect on 02/17/2024.  Patient continued to  have some intermittent confusion and altered mental status with headache, and requested neurology evaluation for headaches.   On my exam today, patient does not have any focal neurological deficits.  Does not seem consistent with migraine headaches, cluster headaches, or tension headaches.  Sounds positional with triggers with only sitting up.  Unclear what these headaches are from, but likely related to recent surgery.  Likely will take some time. If headaches persist, would recommend evaluating for another CSF leak.  Do not think the timetable for it is a chronic meningitis picture, but would likely get MRI brain to evaluate these lipoid deposits further if not improved.  RECOMMENDATIONS  - Supportive care at this time - Agree with hospitalist headache treatment - If persistent, will need to evaluate for another CSF leak  - Will discuss further recommendations with attending ______________________________________________________________________  Signed, Libby Blanch, DO Internal Medicine Resident PGY-3  Neurology service   ATTENDING ATTESTATION:   Patient's daughter was in the room at time of interview and examination.  She is laying flat on her back currently has a headache she is able to get up to use a bat bedside commode if needed.  She had her CSF leak repaired.  She is not drinking much not taking much p.o. intake.  Her urine output is low.  I explained that her headache is positional worse when getting up and clearly related to CSF leak and low CSF pressure.  Will get a brain MRI with and without contrast due to prior abnormal scan.  Advised that she needs to hydrate more aggressively.  Will give a 500 fluid bolus and start IV fluids at 90 cc an hour overnight this can be tapered tomorrow to 75.  Will follow-up on MRI scan.  Family's questions were answered to their satisfaction.  Dr. Nichola evaluated pt independently, reviewed imaging, chart, labs. Discussed and formulated plan with  the Resident/APP. Changes were made to the note where appropriate. Please see APP/resident note above for details.    Diane Weissberg,MD

## 2024-02-19 NOTE — Progress Notes (Signed)
 PROGRESS NOTE Diane Watkins  FMW:[id number] DOB: [birth year] DOA: 02/14/2024 PCP: Cook, Jayce G, DO  Brief Narrative/Hospital Course: Diane Watkins is a 73 y.o. year old female with PMH of  HTN, HLD, DM2, GERD, and a recent transforaminal lumbar decompression and fusion at L4-5 on 02/08/2024 with discharge from the hospital on 02/10/2024. She reported a headache at the time of her discharge but clinical symptoms were felt to most likely represent a tension headache. She returned to the ER 8/31 with persistent unrelenting severe generalized headache.  She had stopped taking her prescribed narcotic pain medication due to itching.  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 Patient admitted for persistent headache, orthospine surgery consulted Patient completed MRI showing persistent leak 9/3 after second opinion by Dr. Joshua underwent exploration of the lumbar wound with closure of dural defect  Subjective: Seen and examined today Overnight afebrile BP stable labs reviewed persistent leukocytosis Mild confused today after the pain meds- currently aaox3, daughter at bedside UA pending. Some headache but much better, some nauseas and back pain. Family at bedside- request neuro eval.  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 Headache failed to improve with conservative management 9/3 after second opinion by Dr. Joshua underwent exploration of the lumbar wound with closure of dural defect Defer PT OT evaluation to orthospine when stable Holding  chemical prophylaxis for now per surgery team Continue multimodal pain management with some confusion this am, likely from dilaudid  and tramadol - stopped dilaudid , cont toradol , now she is aa0x3. She is uncomfortable with pain checking UA. Family requesting Neuro eval due to confusion and headache   Mild postoperative anemia Anemia of chronic illness: hemoglobin stable around 10 g.  Monitor hemoglobin   DM2: Well-controlled, continue SSI Recent Labs  Lab 02/18/24 0802 02/18/24 1208 02/18/24 1616 02/18/24 2144 02/19/24 0902  GLUCAP 191* 102* 194* 140* 170*     HTN: Stable, Continue home lisinopril .   HLD: Continue home atorvastatin  and Lovaza .  Constipation: Last BM 8/31.  Intensify bowel regimen while on pain medication. Add PR dulcolax today  Left hand phlebitis: Keflex  started  Class II Obesity w/ Body mass index is 37.49 kg/m.: Will benefit with PCP follow-up, weight loss,healthy lifestyle and outpatient sleep eval.  PT OT: PT OT timing is deferred to orthopedics   DVT prophylaxis: SCD's Start: 02/17/24 2112 SCDs Start: 02/14/24 1503 unable to use chemical prophylaxis postop Code Status:   Code Status: Full Code Family Communication: plan of care discussed with patient/sister at bedside. Patient status is: Remains hospitalized because of severity of illness Level of care: Med-Surg   Dispo: The patient is from: home            Anticipated disposition: TBD Objective: Vitals last 24 hrs: Vitals:   02/18/24 1618 02/18/24 2126 02/19/24 0651 02/19/24 0901  BP: 126/82 (!) 153/71 129/77 (!) 140/82  Pulse: 97 99 62 96  Resp: 18 16 18 17   Temp: 97.9 F (36.6 C) 98.3 F (36.8 C) (!) 97.5 F (36.4 C) 98.4 F (36.9 C)  TempSrc: Oral Oral Oral Oral  SpO2: 96% 97% 95%  94%  Weight:      Height:        Physical Examination: General exam: AAOX3, obese, HEENT:Oral mucosa moist, Ear/Nose WNL grossly Respiratory system: B/l clear BS,no use of accessory  muscle Cardiovascular system: S1 & S2 +, No JVD. Gastrointestinal system: Abdomen soft,NT,ND, BS+ Nervous System: Alert, awake, moving all extremities, mild phlebitis on left hand at iv site, incision site w/ dressing in place Extremities: LE edema neg, distal extremities warm.  Skin: No rashes,no icterus. MSK: Normal muscle bulk,tone, power   Medications reviewed:  Scheduled Meds:  cephALEXin   500 mg Oral Q6H   docusate sodium   100 mg Oral BID   insulin  aspart  0-15 Units Subcutaneous TID WC   lisinopril   20 mg Oral QPM   loratadine   10 mg Oral Daily   omega-3 acid ethyl esters  2 capsule Oral BID   rosuvastatin   40 mg Oral QPM   senna-docusate  1 tablet Oral BID   sodium chloride  flush  3 mL Intravenous Q12H   traZODone   50 mg Oral QHS   Continuous Infusions:   Diet: Diet Order             Diet regular Room service appropriate? Yes; Fluid consistency: Thin  Diet effective now                    Data Reviewed: I have personally reviewed following labs and imaging studies ( see epic result tab) CBC: Recent Labs  Lab 02/14/24 1345 02/15/24 0706 02/17/24 1151 02/18/24 0228 02/19/24 0224  WBC 11.1* 10.2 13.6* 14.2* 14.4*  NEUTROABS 8.3*  --   --   --   --   HGB 10.7* 10.6* 11.7* 12.0 10.6*  HCT 32.5* 31.6* 34.1* 34.5* 30.7*  MCV 94.5 94.3 92.2 91.5 91.9  PLT 317 283 365 363 337   CMP: Recent Labs  Lab 02/14/24 1207 02/15/24 0706 02/17/24 1151 02/18/24 0228 02/19/24 0224  NA 138 140 136 138 140  K 4.0 5.1 3.9 4.6 4.4  CL 105 101 101 102 103  CO2 21* 29 24 24 23   GLUCOSE 110* 117* 125* 147* 146*  BUN 18 12 15 14 22   CREATININE 1.10* 0.90 0.98 0.97 0.89  CALCIUM  8.3* 9.3 8.9 9.0 8.7*   GFR: Estimated Creatinine Clearance: 62.5 mL/min (by C-G formula based on SCr of 0.89 mg/dL). Recent Labs  Lab 02/14/24 1207 02/15/24 0706  AST 24 14*  ALT 30 26  ALKPHOS 40 39  BILITOT 0.8 0.7  PROT 5.3* 5.5*  ALBUMIN  2.8* 2.9*   No results for input(s):  LIPASE, AMYLASE in the last 168 hours. No results for input(s): AMMONIA in the last 168 hours. Coagulation Profile: No results for input(s): INR, PROTIME in the last 168 hours. Unresulted Labs (From admission, onward)     Start     Ordered   02/19/24 0855  TSH  Add-on,   AD       Question:  Specimen collection method  Answer:  Lab=Lab collect   02/19/24 0855   02/19/24 0855  Ammonia  Add-on,   AD       Question:  Specimen collection method  Answer:  Lab=Lab collect   02/19/24 0855   02/19/24 0855  Vitamin B12  Add-on,   AD       Question:  Specimen collection method  Answer:  Lab=Lab collect   02/19/24 0855   02/19/24 0805  Urinalysis, Routine w reflex microscopic -Urine, Clean Catch  Once,  R       Question:  Specimen Source  Answer:  Urine, Clean Catch   02/19/24 0804   02/18/24 0500  Basic metabolic panel with GFR  Daily,   R     Question:  Specimen collection method  Answer:  Lab=Lab collect   02/17/24 1035   02/18/24 0500  CBC  Daily,   R     Question:  Specimen collection method  Answer:  Lab=Lab collect   02/17/24 1035   02/14/24 1207  CBC with Differential/Platelet  Once,   STAT        02/14/24 1206           Antimicrobials/Microbiology: Anti-infectives (From admission, onward)    Start     Dose/Rate Route Frequency Ordered Stop   02/19/24 0845  cephALEXin  (KEFLEX ) capsule 500 mg        500 mg Oral Every 6 hours 02/19/24 0759 02/24/24 0559   02/18/24 0000  ceFAZolin  (ANCEF ) IVPB 2g/100 mL premix        2 g 200 mL/hr over 30 Minutes Intravenous Every 6 hours 02/17/24 2112 02/18/24 0800   02/17/24 1015  ceFAZolin  (ANCEF ) IVPB 2g/100 mL premix        2 g 200 mL/hr over 30 Minutes Intravenous  Once 02/17/24 0919 02/17/24 1828         Component Value Date/Time   SDES BLOOD RIGHT HAND 03/13/2019 0038   SPECREQUEST  03/13/2019 0038    BOTTLES DRAWN AEROBIC AND ANAEROBIC Blood Culture adequate volume   CULT  03/13/2019 0038    NO GROWTH 5 DAYS Performed  at [facility], [address] [place], [place] [address]    REPTSTATUS 03/18/2019 FINAL 03/13/2019 0038  Procedures: Procedure(s) (LRB): WOUND EXPLORATION (N/A) REMOVAL, HARDWARE (N/A) Mennie LAMY, MD [facility] 02/19/2024, 10:23 AM

## 2024-02-19 NOTE — Progress Notes (Signed)
 The patient is seen resting in bed.  The confusion has improved after the pain medications have worn off.  She reports headaches if she sits up to approximately 45 degrees.  At this point we will keep her at 30 degrees another 24 hours.  Will then reassess in the morning to see if we can elevate the head of her bed.  Will continue with hold narcotics due to confusion and try tramadol , Tylenol  and Toradol .

## 2024-02-19 NOTE — Plan of Care (Signed)
  Problem: Education: Goal: Knowledge of General Education information will improve Description: Including pain rating scale, medication(s)/side effects and non-pharmacologic comfort measures Outcome: Progressing   Problem: Health Behavior/Discharge Planning: Goal: Ability to manage health-related needs will improve Outcome: Progressing   Problem: Clinical Measurements: Goal: Ability to maintain clinical measurements within normal limits will improve Outcome: Progressing Goal: Will remain free from infection Outcome: Progressing Goal: Diagnostic test results will improve Outcome: Progressing Goal: Respiratory complications will improve Outcome: Progressing Goal: Cardiovascular complication will be avoided Outcome: Progressing   Problem: Activity: Goal: Risk for activity intolerance will decrease Outcome: Progressing   Problem: Nutrition: Goal: Adequate nutrition will be maintained Outcome: Progressing   Problem: Coping: Goal: Level of anxiety will decrease Outcome: Progressing   Problem: Elimination: Goal: Will not experience complications related to bowel motility Outcome: Progressing Goal: Will not experience complications related to urinary retention Outcome: Progressing   Problem: Pain Managment: Goal: General experience of comfort will improve and/or be controlled Outcome: Progressing   Problem: Safety: Goal: Ability to remain free from injury will improve Outcome: Progressing   Problem: Skin Integrity: Goal: Risk for impaired skin integrity will decrease Outcome: Progressing   Problem: Education: Goal: Ability to describe self-care measures that may prevent or decrease complications (Diabetes Survival Skills Education) will improve Outcome: Progressing Goal: Individualized Educational Video(s) Outcome: Progressing   Problem: Coping: Goal: Ability to adjust to condition or change in health will improve Outcome: Progressing   Problem: Fluid  Volume: Goal: Ability to maintain a balanced intake and output will improve Outcome: Progressing   Problem: Health Behavior/Discharge Planning: Goal: Ability to identify and utilize available resources and services will improve Outcome: Progressing Goal: Ability to manage health-related needs will improve Outcome: Progressing   Problem: Metabolic: Goal: Ability to maintain appropriate glucose levels will improve Outcome: Progressing   Problem: Nutritional: Goal: Maintenance of adequate nutrition will improve Outcome: Progressing Goal: Progress toward achieving an optimal weight will improve Outcome: Progressing   Problem: Skin Integrity: Goal: Risk for impaired skin integrity will decrease Outcome: Progressing   Problem: Tissue Perfusion: Goal: Adequacy of tissue perfusion will improve Outcome: Progressing   Problem: Education: Goal: Ability to verbalize activity precautions or restrictions will improve Outcome: Progressing Goal: Knowledge of the prescribed therapeutic regimen will improve Outcome: Progressing Goal: Understanding of discharge needs will improve Outcome: Progressing   Problem: Activity: Goal: Ability to avoid complications of mobility impairment will improve Outcome: Progressing Goal: Ability to tolerate increased activity will improve Outcome: Progressing Goal: Will remain free from falls Outcome: Progressing   Problem: Bowel/Gastric: Goal: Gastrointestinal status for postoperative course will improve Outcome: Progressing   Problem: Clinical Measurements: Goal: Ability to maintain clinical measurements within normal limits will improve Outcome: Progressing Goal: Postoperative complications will be avoided or minimized Outcome: Progressing Goal: Diagnostic test results will improve Outcome: Progressing   Problem: Pain Management: Goal: Pain level will decrease Outcome: Progressing   Problem: Skin Integrity: Goal: Will show signs of wound  healing Outcome: Progressing   Problem: Health Behavior/Discharge Planning: Goal: Identification of resources available to assist in meeting health care needs will improve Outcome: Progressing   Problem: Bladder/Genitourinary: Goal: Urinary functional status for postoperative course will improve Outcome: Progressing   Problem: Clinical Measurements: Goal: Ability to avoid or minimize complications of infection will improve Outcome: Progressing   Problem: Skin Integrity: Goal: Skin integrity will improve Outcome: Progressing

## 2024-02-20 ENCOUNTER — Inpatient Hospital Stay (HOSPITAL_COMMUNITY)

## 2024-02-20 DIAGNOSIS — R291 Meningismus: Secondary | ICD-10-CM | POA: Diagnosis not present

## 2024-02-20 DIAGNOSIS — G038 Meningitis due to other specified causes: Secondary | ICD-10-CM

## 2024-02-20 LAB — CBC
HCT: 30.9 % — ABNORMAL LOW (ref 36.0–46.0)
Hemoglobin: 10.6 g/dL — ABNORMAL LOW (ref 12.0–15.0)
MCH: 31.4 pg (ref 26.0–34.0)
MCHC: 34.3 g/dL (ref 30.0–36.0)
MCV: 91.4 fL (ref 80.0–100.0)
Platelets: 270 K/uL (ref 150–400)
RBC: 3.38 MIL/uL — ABNORMAL LOW (ref 3.87–5.11)
RDW: 13.2 % (ref 11.5–15.5)
WBC: 12.9 K/uL — ABNORMAL HIGH (ref 4.0–10.5)
nRBC: 0 % (ref 0.0–0.2)

## 2024-02-20 LAB — GLUCOSE, CAPILLARY
Glucose-Capillary: 164 mg/dL — ABNORMAL HIGH (ref 70–99)
Glucose-Capillary: 144 mg/dL — ABNORMAL HIGH (ref 70–99)
Glucose-Capillary: 180 mg/dL — ABNORMAL HIGH (ref 70–99)
Glucose-Capillary: 208 mg/dL — ABNORMAL HIGH (ref 70–99)

## 2024-02-20 LAB — BASIC METABOLIC PANEL WITH GFR
Anion gap: 10 (ref 5–15)
BUN: 18 mg/dL (ref 8–23)
CO2: 22 mmol/L (ref 22–32)
Calcium: 8.8 mg/dL — ABNORMAL LOW (ref 8.9–10.3)
Chloride: 106 mmol/L (ref 98–111)
Creatinine, Ser: 0.89 mg/dL (ref 0.44–1.00)
GFR, Estimated: >60 mL/min (ref >60)
Glucose, Bld: 111 mg/dL — ABNORMAL HIGH (ref 70–99)
Potassium: 4.5 mmol/L (ref 3.5–5.1)
Sodium: 138 mmol/L (ref 135–145)

## 2024-02-20 LAB — PROCALCITONIN: Procalcitonin: 0.13 ng/mL

## 2024-02-20 MED ORDER — HYDROCORTISONE 1 % EX LOTN: TOPICAL_LOTION | CUTANEOUS | Status: DC | PRN

## 2024-02-20 MED ORDER — HYDROCODONE-ACETAMINOPHEN 5-325 MG PO TABS
1.0000 | ORAL_TABLET | Freq: Four times a day (QID) | ORAL | Status: DC | PRN
  Administered 2024-02-20 – 2024-02-21 (×2): 1 via ORAL
  Filled 2024-02-20 (×4): qty 1

## 2024-02-20 MED ORDER — ACETAMINOPHEN 650 MG RE SUPP
650.0000 mg | Freq: Four times a day (QID) | RECTAL | Status: DC | PRN
  Filled 2024-02-20: qty 1

## 2024-02-20 MED ORDER — TRAMADOL HCL 50 MG PO TABS: 50.0000 mg | ORAL_TABLET | Freq: Four times a day (QID) | ORAL | Status: DC | PRN

## 2024-02-20 MED ORDER — BACITRACIN-NEOMYCIN-POLYMYXIN OINTMENT TUBE
TOPICAL_OINTMENT | Freq: Two times a day (BID) | CUTANEOUS | Status: DC
  Administered 2024-02-22: 1 via TOPICAL
  Filled 2024-02-20: qty 14

## 2024-02-20 MED ORDER — SODIUM CHLORIDE 0.9 % IV SOLN: INTRAVENOUS | Status: AC

## 2024-02-20 MED ORDER — SMOG ENEMA
960.0000 mL | Freq: Once | RECTAL | Status: AC
  Administered 2024-02-20: 960 mL via RECTAL
  Filled 2024-02-20: qty 960

## 2024-02-20 MED ORDER — PROCHLORPERAZINE EDISYLATE 10 MG/2ML IJ SOLN: 5.0000 mg | Freq: Four times a day (QID) | INTRAMUSCULAR | Status: AC | PRN

## 2024-02-20 NOTE — Progress Notes (Signed)
 Subjective: Procedure(s) (LRB): WOUND EXPLORATION (N/A) REMOVAL, HARDWARE (N/A) 3 Days Post-Op  The patient is resting comfortably bed.  She is sleeping.  I spoke with the patient's daughter who is at the bedside.  She reports that overnight her main concern is primarily pain more in the right buttock.  Some of the postoperative pain she has had.  They have given her multiple treatments for her constipation.  She has passed gas, apparently quite a bit of it, but no bowel movement yet.  She did have a headache, however this did not appear to be positional in nature.  She had a headache when laying down.  She has a headache when sitting trying to use the commode and it does not worsen when she was sitting up.  Objective: Vital signs in last 24 hours: Temp:  [97.5 F (36.4 C)-101.1 F (38.4 C)] 98.1 F (36.7 C) (09/06 0427) Pulse Rate:  [83-108] 83 (09/06 0427) Resp:  [17-20] 17 (09/06 0427) BP: (105-160)/(65-73) 115/65 (09/06 0427) SpO2:  [92 %-100 %] 100 % (09/06 0427)  Intake/Output from previous day: 09/05 0701 - 09/06 0700 In: 956 [P.O.:270; I.V.:686] Out: 500 [Urine:500]  Labs: Recent Labs    02/18/24 0228 02/19/24 0224  WBC 14.2* 14.4*  RBC 3.77* 3.34*  HCT 34.5* 30.7*  PLT 363 337   Recent Labs    02/19/24 0224 02/20/24 0512  NA 140 138  K 4.4 4.5  CL 103 106  CO2 23 22  BUN 22 18  CREATININE 0.89 0.89  GLUCOSE 146* 111*  CALCIUM  8.7* 8.8*   No results for input(s): LABPT, INR in the last 72 hours.  Physical Exam: Well-appearing woman resting in bed.  I did not wake her from sleep.   MRI scan of the brain was performed arms yesterday evening.  1. Many small T1 hyperintensities in the infratentorial and supratentorial extra-axial spaces, correlating with findings on recent CT head and compatible with lipid material. 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 2. No evidence of complicating features. Specifically, no brain edema, acute infarct, hydrocephalus, or  abnormal enhancement.     Assessment/Plan:  The patient's confusion seems to have improved without the IV Dilaudid .  However she does have intermittent pain.  Will try adding some hydrocodone  to see if she tolerates this without increased confusion.    She does continue with some degree of headaches but is did not seem to be positional and therefore hard to assess.  Will try to observe these.  Will try to increase her head of bed later on today.  Again starting at 30 but gradually increasing and seeing if the headaches worsen or change.    She did have a fever overnight.  Her white count has been slightly elevated for several days.  Her CBC is not yet back from today.  Will continue observe this.  Her prior UA was negative.  However will obtain a chest x-ray as well as blood cultures.  There is a small area of phlebitis at the site of an IV on her left hand.  This seems to have improved from prior examination.  Will treat this with hot compresses and rest.  We will continue the oral antibiotics for this  An MRI scan of the brain was obtained by neurology.  This does not show any acute findings but will defer to neurology for further interpretation  Patient continues to have significant constipation.  Will add a smog enema to see if this helps  This was discussed with the  patient's family members.  Will turn later on today to reassess the patient.   Cordella Rhein, MD, MS [facility] [phone]

## 2024-02-20 NOTE — Progress Notes (Signed)
 MEWS Progress Note  Patient Details Name: ADRIENE KNIPFER MRN: [id number] DOB: 1950-09-28 Today's Date: 02/20/2024   MEWS Flowsheet Documentation:  Assess: MEWS Score Temp: 99 F (37.2 C) BP: (!) 105/59 MAP (mmHg): 73 Pulse Rate: (!) 113 ECG Heart Rate: 82 Resp: 17 Level of Consciousness: Alert SpO2: 92 % O2 Device: Room Air O2 Flow Rate (L/min): 3 L/min Assess: MEWS Score MEWS Temp: 0 MEWS Systolic: 0 MEWS Pulse: 2 MEWS RR: 0 MEWS LOC: 0 MEWS Score: 2 MEWS Score Color: Yellow Assess: SIRS CRITERIA SIRS Temperature : 0 SIRS Respirations : 0 SIRS Pulse: 1 SIRS WBC: 0 SIRS Score Sum : 1 SIRS Temperature : 0 SIRS Pulse: 1 SIRS Respirations : 0 SIRS WBC: 0 SIRS Score Sum : 1 Assess: if the MEWS score is Yellow or Red Were vital signs accurate and taken at a resting state?: Yes Does the patient meet 2 or more of the SIRS criteria?: Yes Does the patient have a confirmed or suspected source of infection?: Yes MEWS guidelines implemented : Yes, yellow Treat MEWS Interventions: Considered administering scheduled or prn medications/treatments as ordered Take Vital Signs Increase Vital Sign Frequency : Yellow: Q2hr x1, continue Q4hrs until patient remains green for 12hrs Escalate MEWS: Escalate: Yellow: Discuss with charge nurse and consider notifying provider and/or RRT        Canna Nickelson K Jeison Delpilar 02/20/2024, 10:33 AM

## 2024-02-20 NOTE — Progress Notes (Signed)
 PT Cancellation Note  Patient Details Name: Diane Watkins MRN: [id number] DOB: [birth year]   Cancelled Treatment:    Reason Eval/Treat Not Completed: Patient not medically ready. Per orthopedic recommendations, PT/OT to await until cleared by physician to participate in mobility.   Sherryle VEAR Osseo 02/20/2024, 2:07 PM

## 2024-02-20 NOTE — Progress Notes (Signed)
 OT Cancellation Note  Patient Details Name: Diane Watkins MRN: [id number] DOB: 1950/10/10   Cancelled Treatment:    Reason Eval/Treat Not Completed: Other (comment) (Waiting on how pt tolerates sitting up in the bed with orthopedic last note. Will follow up.)  Diane POUR OTR/L  Acute Rehab Services  [phone] office number  Diane Watkins 02/20/2024, 9:09 AM

## 2024-02-20 NOTE — Progress Notes (Signed)
 [name]  FMW:[id number] DOB: 1951-05-10 DOA: 02/14/2024 PCP: Cook, Jayce G, DO  Brief Narrative/Hospital Course: Diane Watkins is a 73 y.o. year old female with PMH of  HTN, HLD, DM2, GERD, and a recent transforaminal lumbar decompression and fusion at L4-5 on 02/08/2024 with discharge from the hospital on 02/10/2024. She reported a headache at the time of her discharge but clinical symptoms were felt to most likely represent a tension headache. She returned to the ER 8/31 with persistent unrelenting severe generalized headache.  She had stopped taking her prescribed narcotic pain medication due to itching.  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 Patient admitted for persistent headache, orthospine surgery consulted Patient completed MRI showing persistent leak 9/3 after second opinion by Dr. Joshua underwent exploration of the lumbar wound with closure of dural defect  Subjective: Seen and examined Overnight and had episode of fever one 101.1 last night, afebrile this morning BP stable on room air Labs this morning stable BMP, CBC with WBC improving  She is alert awake oriented x 3, no more headache Some right buttock pain has been going on for some time Able to move all extremities well Mild tachycardia tachycardia driving her MEWS.  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 Headache failed to improve with conservative management On 9/3 after second opinion by Dr. 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   Has  B12 deficiency and replacing Continue PT OT as per orthopedics  B12 deficiency: Replacement started  Fever 9/5 night Phlebitis of the left hand: Repeat CBC better, procalcitonin neg, cxr 9/6- stable. UA 9/5   neg LE/Nitrite 9/5 placed on Keflex  , antibiotics ointment in the left hand 9/5 Blood cx sent 9/6  Mild postoperative anemia Anemia of chronic illness: hemoglobin stable around 10 g.  Monitor hemoglobin Recent Labs  Lab 02/15/24 0706 02/17/24 1151 02/18/24 0228 02/19/24 0224 02/20/24 0910  HGB 10.6* 11.7* 12.0 10.6* 10.6*  HCT 31.6* 34.1* 34.5* 30.7* 30.9*     DM2: Well-controlled, continue SSI Recent Labs  Lab 02/18/24 0802 02/18/24 1208 02/18/24 1616 02/18/24 2144 02/19/24 0902  GLUCAP 191* 102* 194* 140* 170*     HTN: Stable, Continue home lisinopril .   HLD: Continue home atorvastatin  and Lovaza .  Constipation: Continue bowel regimen, patient had bowel movement 9/5 previously on 8/31   Class II Obesity w/ Body mass index is 37.49 kg/m.: Will benefit with PCP follow-up, weight loss,healthy lifestyle and outpatient sleep eval.  PT OT: PT OT timing is deferred to orthopedics  DVT prophylaxis: SCD's Start: 02/17/24 2112 SCDs Start: 02/14/24 1503 unable to use chemical prophylaxis postop Code Status:    Code Status: Full Code Family Communication: plan of care discussed with patient/sister at bedside. Patient status is: Remains hospitalized because of severity of illness Level of care: Med-Surg   Dispo: The patient is from: home            Anticipated disposition: TBD Objective: Vitals last 24 hrs: Vitals:   02/18/24 1618 02/18/24 2126 02/19/24 0651 02/19/24 0901  BP: 126/82 (!) 153/71 129/77 (!) 140/82  Pulse: 97 99 62 96  Resp: 18 16 18 17   Temp: 97.9 F (36.6 C) 98.3 F (36.8 C) (!) 97.5 F (36.4 C) 98.4 F (36.9 C)  TempSrc: Oral Oral Oral Oral  SpO2: 96% 97% 95% 94%  Weight:      Height:        Physical Examination: General exam: Alert awake, pleasant, obese  HEENT:Oral mucosa moist, Ear/Nose WNL grossly Respiratory system: B/l clear BS,no use of accessory muscle Cardiovascular system: S1 & S2 +, No JVD. Gastrointestinal system: Abdomen soft,NT,ND, BS+ Nervous System: Alert, awake, moving all extremities, redness tenderness on the left wrist area , dressing on low back intact  Extremities: LE edema neg, distal extremities warm.  Skin: No rashes,no icterus. MSK: Normal muscle bulk,tone, power   Medications reviewed:  Scheduled Meds:  cephALEXin   500 mg Oral Q6H   docusate sodium   100 mg Oral BID   insulin  aspart  0-15 Units Subcutaneous TID WC   lisinopril   20 mg Oral QPM   loratadine   10 mg Oral Daily   omega-3 acid ethyl esters  2 capsule Oral BID   rosuvastatin   40 mg Oral QPM   senna-docusate  1 tablet Oral BID   sodium chloride  flush  3 mL Intravenous Q12H   traZODone   50 mg Oral QHS   Continuous Infusions:   Diet: Diet Order             Diet regular Room service appropriate? Yes; Fluid consistency: Thin  Diet effective now                    Data Reviewed: I have personally reviewed following labs and imaging studies ( see epic result tab) CBC: Recent Labs  Lab 02/14/24 1345 02/15/24 0706 02/17/24 1151 02/18/24 0228 02/19/24 0224  02/20/24 0910  WBC 11.1* 10.2 13.6* 14.2* 14.4* 12.9*  NEUTROABS 8.3*  --   --   --   --   --   HGB 10.7* 10.6* 11.7* 12.0 10.6* 10.6*  HCT 32.5* 31.6* 34.1* 34.5* 30.7* 30.9*  MCV 94.5 94.3 92.2 91.5 91.9 91.4  PLT 317 283 365 363 337 270   CMP: Recent Labs  Lab 02/15/24 0706 02/17/24 1151 02/18/24 0228 02/19/24 0224 02/20/24 0512  NA 140 136 138 140 138  K 5.1 3.9 4.6 4.4 4.5  CL 101 101 102 103 106  CO2 29 24 24 23 22   GLUCOSE 117* 125* 147* 146* 111*  BUN 12 15 14 22 18   CREATININE 0.90 0.98 0.97 0.89 0.89  CALCIUM  9.3 8.9 9.0 8.7* 8.8*   GFR: Estimated Creatinine Clearance: 62.5 mL/min (by C-G formula based on SCr of 0.89 mg/dL). Recent Labs  Lab 02/14/24 1207 02/15/24 0706  AST 24 14*  ALT 30 26  ALKPHOS 40 39  BILITOT 0.8 0.7  PROT 5.3* 5.5*  ALBUMIN  2.8* 2.9*   No results for input(s): LIPASE, AMYLASE in the last  168 hours.  Recent Labs  Lab 02/19/24 0947  AMMONIA 32   Coagulation Profile: No results for input(s): INR, PROTIME in the last 168 hours. Unresulted Labs (From admission, onward)     Start     Ordered   02/20/24 0913  Culture, blood (Routine X 2) w Reflex to ID Panel  BLOOD CULTURE X 2,   R (with TIMED occurrences)      02/20/24 0917   02/18/24 0500  Basic metabolic panel with GFR  Daily,   R     Question:  Specimen collection method  Answer:  Lab=Lab collect   02/17/24 1035   02/18/24 0500  CBC  Daily,   R     Question:  Specimen collection method  Answer:  Lab=Lab collect   02/17/24 1035   02/14/24 1207  CBC with Differential/Platelet  Once,   STAT        02/14/24 1206           Antimicrobials/Microbiology: Anti-infectives (From admission, onward)    Start     Dose/Rate Route Frequency Ordered Stop   02/19/24 0845  cephALEXin  (KEFLEX ) capsule 500 mg        500 mg Oral Every 6 hours 02/19/24 0759 02/24/24 0559   02/18/24 0000  ceFAZolin  (ANCEF ) IVPB 2g/100 mL premix        2 g 200 mL/hr over 30 Minutes Intravenous Every  6 hours 02/17/24 2112 02/18/24 0800   02/17/24 1015  ceFAZolin  (ANCEF ) IVPB 2g/100 mL premix        2 g 200 mL/hr over 30 Minutes Intravenous  Once 02/17/24 0919 02/17/24 1828         Component Value Date/Time   SDES BLOOD RIGHT HAND 03/13/2019 0038   SPECREQUEST  03/13/2019 0038    BOTTLES DRAWN AEROBIC AND ANAEROBIC Blood Culture adequate volume   CULT  03/13/2019 0038    NO GROWTH 5 DAYS Performed at [facility], [address], [place], [place] [address]    REPTSTATUS 03/18/2019 FINAL 03/13/2019 0038  Procedures: Procedure(s) (LRB): WOUND EXPLORATION (N/A) REMOVAL, HARDWARE (N/A) Mennie LAMY, MD [facility] 02/20/2024, 11:15 AM

## 2024-02-20 NOTE — Progress Notes (Signed)
 Stroke Team Progress Note  HISTORY   SUBJECTIVE   OBJECTIVE Most recent Vital Signs: Vitals:   02/19/24 2043 02/19/24 2100 02/20/24 0015 02/20/24 0427  BP: (!) 160/73  105/71 115/65  Pulse: 93  (!) 108 83  Resp: 18  20 17   Temp: 97.6 F (36.4 C) (!) 101.1 F (38.4 C) (!) 97.5 F (36.4 C) 98.1 F (36.7 C)  TempSrc: Oral Rectal Oral Oral  SpO2: 96%  92% 100%  Weight:      Height:       CBG (last 3)  Recent Labs    02/19/24 1229 02/19/24 1831 02/19/24 2044  GLUCAP 96 138* 114*    IV Fluid Intake:    sodium chloride  50 mL/hr at 02/20/24 0753    MEDICATIONS   cephALEXin   500 mg Oral Q6H   vitamin B-12  1,000 mcg Oral Daily   docusate sodium   100 mg Oral BID   insulin  aspart  0-15 Units Subcutaneous TID WC   lisinopril   20 mg Oral QPM   loratadine   10 mg Oral Daily   omega-3 acid ethyl esters  2 capsule Oral BID   rosuvastatin   40 mg Oral QPM   senna-docusate  1 tablet Oral BID   sodium chloride  flush  3 mL Intravenous Q12H   traZODone   50 mg Oral QHS   PRN:  acetaminophen  **OR** [DISCONTINUED] acetaminophen , ALPRAZolam , butalbital -acetaminophen -caffeine , cyclobenzaprine , diphenhydrAMINE , ketorolac , ondansetron  (ZOFRAN ) IV, polyethylene glycol, senna, sodium chloride  flush, sodium phosphate , traMADol   Diet:   Diet Order             Diet regular Room service appropriate? Yes; Fluid consistency: Thin  Diet effective now                  CLINICALLY SIGNIFICANT STUDIES Basic Metabolic Panel:  Recent Labs  Lab 02/19/24 0224 02/20/24 0512  NA 140 138  K 4.4 4.5  CL 103 106  CO2 23 22  GLUCOSE 146* 111*  BUN 22 18  CREATININE 0.89 0.89  CALCIUM  8.7* 8.8*   Liver Function Tests:  Recent Labs  Lab 02/14/24 1207 02/15/24 0706  AST 24 14*  ALT 30 26  ALKPHOS 40 39  BILITOT 0.8 0.7  PROT 5.3* 5.5*  ALBUMIN  2.8* 2.9*   CBC:  Recent Labs  Lab 02/14/24 1345 02/15/24 0706 02/18/24 0228 02/19/24 0224  WBC 11.1*   < > 14.2* 14.4*  NEUTROABS  8.3*  --   --   --   HGB 10.7*   < > 12.0 10.6*  HCT 32.5*   < > 34.5* 30.7*  MCV 94.5   < > 91.5 91.9  PLT 317   < > 363 337   < > = values in this interval not displayed.   Coagulation: No results for input(s): LABPROT, INR in the last 168 hours. Cardiac Enzymes: No results for input(s): CKTOTAL, CKMB, CKMBINDEX, TROPONINI in the last 168 hours. Urinalysis:  Recent Labs  Lab 02/19/24 0805  COLORURINE YELLOW  LABSPEC 1.020  PHURINE 5.0  GLUCOSEU 50*  HGBUR NEGATIVE  BILIRUBINUR NEGATIVE  KETONESUR NEGATIVE  PROTEINUR NEGATIVE  NITRITE NEGATIVE  LEUKOCYTESUR NEGATIVE   Lipid Panel    Component Value Date/Time   CHOL 181 08/03/2023 1127   TRIG 223 (H) 08/03/2023 1127   HDL 49 08/03/2023 1127   CHOLHDL 3.7 08/03/2023 1127   CHOLHDL 7.2 04/12/2014 0904   VLDL NOT CALC 04/12/2014 0904   LDLCALC 94 08/03/2023 1127   Lab Results  Component  Value Date   HGBA1C 7.0 (H) 02/03/2024    Urine Drug Screen:  No results found for: LABOPIA, COCAINSCRNUR, LABBENZ, AMPHETMU, THCU, LABBARB  Alcohol  Level: No results for input(s): ETH in the last 168 hours.  MR BRAIN W WO CONTRAST Result Date: 02/19/2024 CLINICAL DATA:  Headache, increasing frequency or severity EXAM: MRI HEAD WITHOUT AND WITH CONTRAST TECHNIQUE: Multiplanar, multiecho pulse sequences of the brain and surrounding structures were obtained without and with intravenous contrast. CONTRAST:  9mL GADAVIST  GADOBUTROL  1 MMOL/ML IV SOLN COMPARISON:  CT head 02/14/2024 FINDINGS: Brain: Multiple small foci of T1 hyperintensity in the suprasellar region and in the right foramen of Luschka and scattered along the posterior aspect of the cerebellum and along the paramidline supratentorial extra-axial spaces, correlating with finding seen on recent CT head. No evidence of brain edema, acute infarct, hydrocephalus, or abnormal enhancement. Vascular: Major arterial flow voids are maintained skull base. Skull and upper  cervical spine: Normal marrow signal. Sinuses/Orbits: Clear sinuses.  No acute orbital findings. IMPRESSION: 1. Many small T1 hyperintensities in the infratentorial and supratentorial extra-axial spaces, correlating with findings on recent CT head and compatible with lipid material. 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 2. No evidence of complicating features. Specifically, no brain edema, acute infarct, hydrocephalus, or abnormal enhancement. Electronically Signed   By: Gilmore GORMAN Molt M.D.   On: 02/19/2024 21:03     Exam:  Gen: in pain, will shout out with any movement by nursing as they attempted to reposition her.  Motor: nonfocal.    Diane Watkins is a 73 y.o. female with hx of hypertension, hyperlipidemia, type 2 diabetes, GERD, recent transforaminal lumbar decompression and fusion at L4-5 on 02/08/2024.  Patient initially discharged on 02/10/2024 and returned back to the emergency department on 08/31 with headaches.  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  Patient had MRI lumbar spine that confirmed a CSF leak which is likely the etiology of her headache.  Patient went exploration of lumbar wound with closure of dural defect on 02/17/2024.  Aggressively hydrated yesterday and overnight.  Still in pain.  Discussed with patient's daughter-in-law who is a Engineer, civil (consulting).  Pain seems to be the biggest issue.  Rectal temperature was taken I requested family which was elevated the rest of the temperatures were within normal range.  No other signs of meningitis.  Not taking much in p.o. due to pain.  MRI results discussed with daughter-in-law.  Few areas of lipoid deposits otherwise unremarkable.    RECOMMENDATIONS   Continue hydration with normal saline IV 50 may increase if she does not drink fluids during the day.  Will probably need several days of  aggressive hydration to ensure CSF pressure remains normal in light of headache still being positional.  Liberalize pain medications but will defer to Ortho.  Consider pain pump if available here.     To contact Stroke Continuity provider, please refer to [email or web address]. After hours, contact General Neurology

## 2024-02-20 NOTE — Plan of Care (Signed)
  Problem: Education: Goal: Knowledge of General Education information will improve Description: Including pain rating scale, medication(s)/side effects and non-pharmacologic comfort measures Outcome: Progressing   Problem: Health Behavior/Discharge Planning: Goal: Ability to manage health-related needs will improve Outcome: Progressing   Problem: Clinical Measurements: Goal: Ability to maintain clinical measurements within normal limits will improve Outcome: Progressing Goal: Will remain free from infection Outcome: Progressing Goal: Diagnostic test results will improve Outcome: Progressing Goal: Respiratory complications will improve Outcome: Progressing Goal: Cardiovascular complication will be avoided Outcome: Progressing   Problem: Activity: Goal: Risk for activity intolerance will decrease Outcome: Progressing   Problem: Nutrition: Goal: Adequate nutrition will be maintained Outcome: Progressing   Problem: Coping: Goal: Level of anxiety will decrease Outcome: Progressing   Problem: Elimination: Goal: Will not experience complications related to bowel motility Outcome: Progressing Goal: Will not experience complications related to urinary retention Outcome: Progressing   Problem: Pain Managment: Goal: General experience of comfort will improve and/or be controlled Outcome: Progressing   Problem: Safety: Goal: Ability to remain free from injury will improve Outcome: Progressing   Problem: Skin Integrity: Goal: Risk for impaired skin integrity will decrease Outcome: Progressing   Problem: Education: Goal: Ability to describe self-care measures that may prevent or decrease complications (Diabetes Survival Skills Education) will improve Outcome: Progressing Goal: Individualized Educational Video(s) Outcome: Progressing   Problem: Coping: Goal: Ability to adjust to condition or change in health will improve Outcome: Progressing   Problem: Fluid  Volume: Goal: Ability to maintain a balanced intake and output will improve Outcome: Progressing   Problem: Health Behavior/Discharge Planning: Goal: Ability to identify and utilize available resources and services will improve Outcome: Progressing Goal: Ability to manage health-related needs will improve Outcome: Progressing   Problem: Metabolic: Goal: Ability to maintain appropriate glucose levels will improve Outcome: Progressing   Problem: Nutritional: Goal: Maintenance of adequate nutrition will improve Outcome: Progressing Goal: Progress toward achieving an optimal weight will improve Outcome: Progressing   Problem: Skin Integrity: Goal: Risk for impaired skin integrity will decrease Outcome: Progressing   Problem: Tissue Perfusion: Goal: Adequacy of tissue perfusion will improve Outcome: Progressing   Problem: Education: Goal: Ability to verbalize activity precautions or restrictions will improve Outcome: Progressing Goal: Knowledge of the prescribed therapeutic regimen will improve Outcome: Progressing Goal: Understanding of discharge needs will improve Outcome: Progressing   Problem: Activity: Goal: Ability to avoid complications of mobility impairment will improve Outcome: Progressing Goal: Ability to tolerate increased activity will improve Outcome: Progressing Goal: Will remain free from falls Outcome: Progressing   Problem: Bowel/Gastric: Goal: Gastrointestinal status for postoperative course will improve Outcome: Progressing   Problem: Clinical Measurements: Goal: Ability to maintain clinical measurements within normal limits will improve Outcome: Progressing Goal: Postoperative complications will be avoided or minimized Outcome: Progressing Goal: Diagnostic test results will improve Outcome: Progressing   Problem: Pain Management: Goal: Pain level will decrease Outcome: Progressing   Problem: Skin Integrity: Goal: Will show signs of wound  healing Outcome: Progressing   Problem: Health Behavior/Discharge Planning: Goal: Identification of resources available to assist in meeting health care needs will improve Outcome: Progressing   Problem: Bladder/Genitourinary: Goal: Urinary functional status for postoperative course will improve Outcome: Progressing   Problem: Clinical Measurements: Goal: Ability to avoid or minimize complications of infection will improve Outcome: Progressing   Problem: Skin Integrity: Goal: Skin integrity will improve Outcome: Progressing

## 2024-02-21 ENCOUNTER — Inpatient Hospital Stay (HOSPITAL_COMMUNITY)

## 2024-02-21 DIAGNOSIS — B9629 Other Escherichia coli [E. coli] as the cause of diseases classified elsewhere: Secondary | ICD-10-CM

## 2024-02-21 DIAGNOSIS — R509 Fever, unspecified: Secondary | ICD-10-CM

## 2024-02-21 DIAGNOSIS — R291 Meningismus: Secondary | ICD-10-CM | POA: Diagnosis not present

## 2024-02-21 DIAGNOSIS — I808 Phlebitis and thrombophlebitis of other sites: Secondary | ICD-10-CM

## 2024-02-21 LAB — CBC
HCT: 36.1 % (ref 36.0–46.0)
Hemoglobin: 11.7 g/dL — ABNORMAL LOW (ref 12.0–15.0)
MCH: 31.4 pg (ref 26.0–34.0)
MCHC: 32.4 g/dL (ref 30.0–36.0)
MCV: 96.8 fL (ref 80.0–100.0)
Platelets: 315 K/uL (ref 150–400)
RBC: 3.73 MIL/uL — ABNORMAL LOW (ref 3.87–5.11)
RDW: 13.4 % (ref 11.5–15.5)
WBC: 15.7 K/uL — ABNORMAL HIGH (ref 4.0–10.5)
nRBC: 0 % (ref 0.0–0.2)

## 2024-02-21 LAB — HEPATIC FUNCTION PANEL
ALT: 24 U/L (ref 0–44)
AST: 29 U/L (ref 15–41)
Albumin: 2.6 g/dL — ABNORMAL LOW (ref 3.5–5.0)
Alkaline Phosphatase: 50 U/L (ref 38–126)
Bilirubin, Direct: 0.1 mg/dL (ref 0.0–0.2)
Indirect Bilirubin: 0.3 mg/dL (ref 0.3–0.9)
Total Bilirubin: 0.4 mg/dL (ref 0.0–1.2)
Total Protein: 5.8 g/dL — ABNORMAL LOW (ref 6.5–8.1)

## 2024-02-21 LAB — BLOOD CULTURE ID PANEL (REFLEXED) - BCID2

## 2024-02-21 LAB — BASIC METABOLIC PANEL WITH GFR
Anion gap: 12 (ref 5–15)
BUN: 21 mg/dL (ref 8–23)
CO2: 22 mmol/L (ref 22–32)
Calcium: 8.6 mg/dL — ABNORMAL LOW (ref 8.9–10.3)
Chloride: 104 mmol/L (ref 98–111)
Creatinine, Ser: 1.02 mg/dL — ABNORMAL HIGH (ref 0.44–1.00)
GFR, Estimated: 59 mL/min — ABNORMAL LOW (ref >60)
Glucose, Bld: 128 mg/dL — ABNORMAL HIGH (ref 70–99)
Potassium: 4 mmol/L (ref 3.5–5.1)
Sodium: 138 mmol/L (ref 135–145)

## 2024-02-21 LAB — GLUCOSE, CAPILLARY
Glucose-Capillary: 129 mg/dL — ABNORMAL HIGH (ref 70–99)
Glucose-Capillary: 119 mg/dL — ABNORMAL HIGH (ref 70–99)
Glucose-Capillary: 101 mg/dL — ABNORMAL HIGH (ref 70–99)
Glucose-Capillary: 211 mg/dL — ABNORMAL HIGH (ref 70–99)
Glucose-Capillary: 160 mg/dL — ABNORMAL HIGH (ref 70–99)

## 2024-02-21 LAB — LIPASE, BLOOD: Lipase: 27 U/L (ref 11–51)

## 2024-02-21 MED ORDER — SODIUM CHLORIDE 0.9 % IV SOLN
2.0000 g | Freq: Two times a day (BID) | INTRAVENOUS | Status: DC
  Administered 2024-02-21 – 2024-02-23 (×5): 2 g via INTRAVENOUS
  Filled 2024-02-21 (×5): qty 12.5

## 2024-02-21 MED ORDER — GADOBUTROL 1 MMOL/ML IV SOLN
10.0000 mL | Freq: Once | INTRAVENOUS | Status: AC | PRN
  Administered 2024-02-21: 10 mL via INTRAVENOUS

## 2024-02-21 NOTE — Progress Notes (Signed)
 PROGRESS NOTE Diane Watkins  FMW:[id number] DOB: 10/05/1950 DOA: 02/14/2024 PCP: Cook, Jayce G, DO  Brief Narrative/Hospital Course: Diane Watkins is a 73 y.o. year old female with PMH of  HTN, HLD, DM2, GERD, and a recent transforaminal lumbar decompression and fusion at L4-5 on 02/08/2024 with discharge from the hospital on 02/10/2024. She reported a headache at the time of her discharge but clinical symptoms were felt to most likely represent a tension headache. She returned to the ER 8/31 with persistent unrelenting severe generalized headache.  She had stopped taking her prescribed narcotic pain medication due to itching.  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 Patient admitted for persistent headache, orthospine surgery consulted Patient completed MRI showing persistent leak 9/3 after second opinion by Dr. Joshua underwent exploration of the lumbar wound with closure of dural defect.  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 failed to improve with conservative rx >after second opinion by Dr. Joshua s/p exploration lumbar wound with closure of dural defect 9/3 Holding chemical prophylaxis for now per surgery team Dilaudid  was discontinued due to  concern for altered mental status,on toradol  tramadol  and Tylenol  as needed Discussed w/ Dr Cordella this am-he is getting MRI of the spine and MRI right hip Continue PT OT as per orthopedics  Enterobacter cloacae bacteremia 9/6 Fever 9/5 night Phlebitis of the left hand: Patient having fever since 9/5 night-procalcitonin neg, cxr 9/6- stable. UA 9/5   neg LE/Nitrite, blood culture sent-positive with Enterobacter clocae-now on cefepime  continue cefepime  monitor fever curve, continue with intermittent local wound care for the left h phlebitis.  Will discuss/consult with ID.  Acute metabolic encephalopathy Lipoid meningitis Right hand tremor B12 deficiency: Due to intermittent confusion neurology was consulted and MRI brain with and without obtained-showed T1 hyperintense in the infratentorial and supratentorial extra axial spaces correlating with findings on recent CT and compatible with lipid material, consider lipoid meningitis-monitor syndrome per neurology .  TSH 4.7, ammonia normal, b12 low Continue IV fluid hydration to ensure CSF pressure remains normal in light of headache still being positional.   Some right hand tremor- will monitor, cont b12 replacement.  Mild postoperative anemia Anemia of chronic illness: hemoglobin stable around 10 g.  Monitor hemoglobin Recent Labs  Lab 02/17/24 1151 02/18/24 0228 02/19/24 0224 02/20/24 0910 02/21/24 0424  HGB 11.7* 12.0 10.6* 10.6* 11.7*  HCT 34.1* 34.5* 30.7* 30.9* 36.1     DM2 with  uncontrolled hyperglycemia: Fairly ok, continue SSI Recent Labs  Lab 02/19/24 2044 02/20/24 1004 02/20/24 1246 02/20/24 1813 02/20/24 2018  GLUCAP 114* 164* 144* 180* 208*     HTN: Stable, Continue home lisinopril .   HLD: Continue home atorvastatin  and Lovaza .  Constipation: Having bowel movement now continue stool softener   Class II Obesity w/ Body mass index is 37.49  kg/m.: Will benefit with PCP follow-up, weight loss,healthy  lifestyle and outpatient sleep eval. Mobility: PT Orders: Active  PT Follow up Rec:     DVT prophylaxis: SCD's Start: 02/17/24 2112 SCDs Start: 02/14/24 1503 unable to use chemical prophylaxis postop Code Status:   Code Status: Full Code Family Communication: plan of care discussed with patient/sister at bedside. Patient status is: Remains hospitalized because of severity of illness Level of care: Med-Surg   Dispo: The patient is from: home            Anticipated disposition: TBD Objective: Vitals last 24 hrs: Vitals:   02/20/24 2244 02/21/24 0022 02/21/24 0338 02/21/24 0345  BP:  (!) 109/55 (!) 158/78   Pulse:  89 93   Resp:  20 20   Temp: 100.2 F (37.9 C) 98.7 F (37.1 C)  98 F (36.7 C)  TempSrc: Rectal Rectal  Oral  SpO2:  95% 99%   Weight:      Height:       Physical Examination: General exam: AAOx3 HEENT:Oral mucosa moist, Ear/Nose WNL grossly Respiratory system: B/l clear BS,no use of accessory muscle Cardiovascular system: S1 & S2 +, No JVD. Gastrointestinal system: Abdomen soft,NT,ND, BS+ Nervous System: Alert, awake, moving all extremities. Non focal Extremities: LE edema neg, distal extremities warm.  Skin:No icterus.  Surgical site with honeycomb dressing in place on the back MSK: Normal muscle bulk,tone, power   Medications reviewed:  Scheduled Meds:  vitamin B-12  1,000 mcg Oral Daily   docusate sodium   100 mg Oral BID   insulin  aspart  0-15 Units Subcutaneous TID WC   lisinopril   20 mg Oral QPM   loratadine   10 mg Oral Daily   neomycin -bacitracin -polymyxin   Topical BID   omega-3 acid ethyl esters  2 capsule Oral BID   rosuvastatin   40 mg Oral QPM   senna-docusate  1 tablet Oral BID   sodium chloride  flush  3 mL Intravenous Q12H   traZODone   50 mg Oral QHS   Continuous Infusions:  sodium chloride  100 mL/hr at 02/21/24 0400   ceFEPime  (MAXIPIME ) IV      Diet: Diet Order             Diet regular Room service appropriate? Yes; Fluid  consistency: Thin  Diet effective now                    Data Reviewed: I have personally reviewed following labs and imaging studies ( see epic result tab) CBC: Recent Labs  Lab 02/14/24 1345 02/15/24 0706 02/17/24 1151 02/18/24 0228 02/19/24 0224 02/20/24 0910 02/21/24 0424  WBC 11.1*   < > 13.6* 14.2* 14.4* 12.9* 15.7*  NEUTROABS 8.3*  --   --   --   --   --   --   HGB 10.7*   < > 11.7* 12.0 10.6* 10.6* 11.7*  HCT 32.5*   < > 34.1* 34.5* 30.7* 30.9* 36.1  MCV 94.5   < > 92.2 91.5 91.9 91.4 96.8  PLT 317   < > 365 363 337 270 315   < > = values in this interval not displayed.   CMP: Recent Labs  Lab 02/17/24 1151 02/18/24 0228 02/19/24 0224 02/20/24 0512 02/21/24 0424  NA 136 138 140 138 138  K 3.9 4.6 4.4 4.5 4.0  CL 101 102 103 106 104  CO2 24 24 23 22 22   GLUCOSE 125* 147* 146* 111* 128*  BUN 15 14 22 18  21  CREATININE 0.98 0.97 0.89 0.89 1.02*  CALCIUM  8.9 9.0 8.7* 8.8* 8.6*   GFR: Estimated Creatinine Clearance: 54.5 mL/min (A) (by C-G formula based on SCr of 1.02 mg/dL (H)). Recent Labs  Lab 02/14/24 1207 02/15/24 0706  AST 24 14*  ALT 30 26  ALKPHOS 40 39  BILITOT 0.8 0.7  PROT 5.3* 5.5*  ALBUMIN  2.8* 2.9*   No results for input(s): LIPASE, AMYLASE in the last 168 hours.  Recent Labs  Lab 02/19/24 0947  AMMONIA 32   Coagulation Profile: No results for input(s): INR, PROTIME in the last 168 hours. Unresulted Labs (From admission, onward)     Start     Ordered   02/20/24 0913  Culture, blood (Routine X 2) w Reflex to ID Panel  BLOOD CULTURE X 2,   R      02/20/24 0917   02/18/24 0500  Basic metabolic panel with GFR  Daily,   R     Question:  Specimen collection method  Answer:  Lab=Lab collect   02/17/24 1035   02/18/24 0500  CBC  Daily,   R     Question:  Specimen collection method  Answer:  Lab=Lab collect   02/17/24 1035   02/14/24 1207  CBC with Differential/Platelet  Once,   STAT        02/14/24 1206            Antimicrobials/Microbiology: Anti-infectives (From admission, onward)    Start     Dose/Rate Route Frequency Ordered Stop   02/21/24 0600  ceFEPIme  (MAXIPIME ) 2 g in sodium chloride  0.9 % 100 mL IVPB        2 g 200 mL/hr over 30 Minutes Intravenous Every 12 hours 02/21/24 0557     02/19/24 0845  cephALEXin  (KEFLEX ) capsule 500 mg  Status:  Discontinued        500 mg Oral Every 6 hours 02/19/24 0759 02/21/24 0557   02/18/24 0000  ceFAZolin  (ANCEF ) IVPB 2g/100 mL premix        2 g 200 mL/hr over 30 Minutes Intravenous Every 6 hours 02/17/24 2112 02/18/24 0800   02/17/24 1015  ceFAZolin  (ANCEF ) IVPB 2g/100 mL premix        2 g 200 mL/hr over 30 Minutes Intravenous  Once 02/17/24 0919 02/17/24 1828         Component Value Date/Time   SDES BLOOD RIGHT HAND 02/20/2024 0921   SPECREQUEST  02/20/2024 0921    BOTTLES DRAWN AEROBIC ONLY Blood Culture adequate volume   CULT GRAM NEGATIVE RODS 02/20/2024 0921   REPTSTATUS PENDING 02/20/2024 9078  Procedures: Procedure(s) (LRB): WOUND EXPLORATION (N/A) REMOVAL, HARDWARE (N/A) Mennie LAMY, MD [facility] 02/21/2024, 10:02 AM

## 2024-02-21 NOTE — Consult Note (Signed)
 Infectious Disease     Reason for Consult:Enterobacter bacteremia    Referring Physician: Dr Mennie Date of Admission:  02/14/2024   Principal Problem:   Meningitis-like reaction Active Problems:   Essential hypertension, benign   Hyperlipidemia   GERD (gastroesophageal reflux disease)   Type 2 diabetes mellitus without complications (HCC)   S/P lumbar fusion   Headache   Meningitis like reaction   HPI: Diane Watkins is a 73 y.o. female with multiple underwent lumbar fusion L4-L5 on.  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  An MRI of the spine that confirmed a CSF leak.  She had exploration of the lumbar closure of defect September 3. She inspected her initially September 5 and again September 6.  Blood cultures were done September 6 and she is growing Enterobacter cloacae 1 of 2 bottles.  Her initial white blood count was 11.1..  Initial LFTs were normal. Given the concern for possible surgical site infection she was started on meropenem and an MRI of the lumbar spine has been ordered.  She has had some phlebitis symptoms at her IV site on her left hand and the IV has been removed. She had some nausea and vomiting last night and has mild abdominal pain.  She had constipation and had enema and is now having loose stools.  Past Medical History:  Diagnosis Date   Arthritis    Diabetes mellitus without complication (HCC)    High cholesterol    History of hiatal hernia    Hyperlipidemia    Hypertension    Past Surgical History:  Procedure Laterality Date   BIOPSY  07/24/2021   Procedure: BIOPSY;  Surgeon: Golda Claudis PENNER, MD;  [facility];  Service: Endoscopy;;  polyp   CATARACT EXTRACTION W/PHACO Left 03/29/2018   Procedure: CATARACT EXTRACTION PHACO AND INTRAOCULAR LENS PLACEMENT (IOC);  Surgeon: Perley Hamilton, MD;  Location: [facility];  Service: Ophthalmology;  Laterality: Left;  CDE: 10.13    CHOLECYSTECTOMY     COLONOSCOPY WITH PROPOFOL  N/A 07/24/2021   Procedure: COLONOSCOPY WITH PROPOFOL ;  Surgeon: Golda Claudis PENNER, MD;  [facility];  Service: Endoscopy;  Laterality: N/A;   ESOPHAGOGASTRODUODENOSCOPY (EGD) WITH PROPOFOL  N/A 07/24/2021   Procedure: ESOPHAGOGASTRODUODENOSCOPY (EGD) WITH PROPOFOL ;  Surgeon: Golda Claudis PENNER, MD;  [facility];  Service: Endoscopy;  Laterality: N/A;   HARDWARE REMOVAL N/A 02/17/2024   Procedure: REMOVAL, HARDWARE;  Surgeon: Reyne Cordella SQUIBB, MD;  Location: [facility];  Service: Orthopedics;  Laterality: N/A;   INCISIONAL HERNIA REPAIR N/A 09/04/2021   Procedure: HERNIA REPAIR INCISIONAL W/MESH;  Surgeon: Mavis Anes, MD;  Location: [facility];  Service: General;  Laterality: N/A;   POLYPECTOMY  07/24/2021   Procedure: POLYPECTOMY;  Surgeon: Golda Claudis PENNER, MD;  [facility];  Service: Endoscopy;;   TRANSFORAMINAL LUMBAR INTERBODY FUSION (TLIF) WITH PEDICLE SCREW FIXATION 1 LEVEL N/A 02/08/2024   Procedure: TRANSFORAMINAL LUMBAR INTERBODY FUSION (TLIF) WITH PEDICLE SCREW FIXATION 1 LEVEL;  Surgeon: Reyne Cordella SQUIBB, MD;  Location[facility];  Service: Orthopedics;  Laterality: N/A;  Transforaminal lumber interbody fusion with pedicle screw fixation, lumbar 4-lumbar 5   TUBAL LIGATION     WOUND EXPLORATION N/A 02/17/2024   Procedure: WOUND EXPLORATION;  Surgeon: Reyne Cordella SQUIBB, MD;  Location: [facility];  Service: Orthopedics;  Laterality: N/A;  Exploration of wound, Repair of Spinal fluid Leak Lumbar four-five  Removal of screws and  Replacement of same screws   Social History   Tobacco Use   Smoking status: Never    Passive exposure: Past   Smokeless tobacco: Never  Vaping Use   Vaping status: Never Used  Substance Use Topics   Alcohol  use: Not Currently    Comment: rare - social   Drug use: No   Family History  Problem Relation Age of Onset   Stroke Mother    Alcohol  abuse Father    Heart disease Father 66       MI     Allergies:  Allergies  Allergen Reactions   Amoxil [Amoxicillin] Other (See Comments)    Bad headaches Has patient had a PCN reaction causing immediate rash, facial/tongue/throat swelling, SOB or lightheadedness with hypotension: No Has patient had a PCN reaction causing severe rash involving mucus membranes or skin necrosis: No Has patient had a PCN reaction that required hospitalization: No Has patient had a PCN reaction occurring within the last 10 years: No If all of the above answers are NO, then may proceed with Cephalosporin use.    Oxycodone -Acetaminophen  Itching    Current antibiotics: Antibiotics Given (last 72 hours)     Date/Time Action Medication Dose Rate   02/19/24 0948 Given   cephALEXin  (KEFLEX ) capsule 500 mg 500 mg    02/19/24 1300 Given   cephALEXin  (KEFLEX ) capsule 500 mg 500 mg    02/19/24 1734 Given   cephALEXin  (KEFLEX ) capsule 500 mg 500 mg    02/20/24 0527 Given   cephALEXin  (KEFLEX ) capsule 500 mg 500 mg    02/20/24 1355 Given   cephALEXin  (KEFLEX ) capsule 500 mg 500 mg    02/20/24 1830 Given   cephALEXin  (KEFLEX ) capsule 500 mg 500 mg    02/21/24 0338 Given   cephALEXin  (KEFLEX ) capsule 500 mg 500 mg    02/21/24 0856 New Bag/Given   ceFEPIme  (MAXIPIME ) 2 g in sodium chloride  0.9 % 100 mL IVPB 2 g 200 mL/hr       MEDICATIONS:  vitamin B-12  1,000 mcg Oral Daily   docusate sodium   100 mg Oral BID   insulin  aspart  0-15 Units Subcutaneous TID WC   lisinopril   20 mg Oral QPM   loratadine   10 mg Oral Daily   neomycin -bacitracin -polymyxin   Topical BID   omega-3 acid ethyl esters  2 capsule Oral BID   rosuvastatin   40 mg Oral QPM   senna-docusate  1 tablet Oral BID   sodium chloride  flush  3 mL Intravenous Q12H   traZODone   50 mg Oral QHS    Review of Systems - 11 systems reviewed and negative per HPI   OBJECTIVE: Temp:  [97.7 F (36.5 C)-102.7 F (39.3 C)] 99 F (37.2 C) (09/07 0900) Pulse Rate:  [89-111] 104 (09/07  0900) Resp:  [17-20] 18 (09/07 0900) BP: (109-158)/(55-86) 131/67 (09/07 0900) SpO2:  [93 %-99 %] 93 % (09/07 0900) Physical Exam  Constitutional:  oriented to person, place, and time. appears well-developed and well-nourished. No distress.  HENT: [place]/AT, PERRLA, no scleral icterus Mouth/Throat: Oropharynx is clear and dry.   Cardiovascular: Normal rate, regular rhythm and normal heart sounds. Exam reveals no gallop and no friction rub.  Pulmonary/Chest: Effort normal and breath sounds normal. No respiratory distress.  has no wheezes.  Neck = supple, no nuchal rigidity Abdominal: Soft. Bowel sounds are normal.  Midl distention, mild diffuse ttp  Lymphadenopathy: no cervical adenopathy. No axillary adenopathy Neurological: alert and oriented to person, place, and time.  Skin: Skin is warm and dry. Hand wound at prior iv site-  Psychiatric: a normal mood and affect.  behavior is normal.    LABS: Results for orders placed or performed during the hospital encounter of 02/14/24 (from the past 48 hours)  Glucose, capillary     Status: None   Collection Time: 02/19/24 12:29 PM  Result Value Ref Range   Glucose-Capillary 96 70 - 99 mg/dL    Comment: Glucose reference range applies only to samples taken after fasting for at least 8 hours.  Glucose, capillary     Status: Abnormal   Collection Time: 02/19/24  6:31 PM  Result Value Ref Range   Glucose-Capillary 138 (H) 70 - 99 mg/dL    Comment: Glucose reference range applies only to samples taken after fasting for at least 8 hours.  Glucose, capillary     Status: Abnormal   Collection Time: 02/19/24  8:44 PM  Result Value Ref Range   Glucose-Capillary 114 (H) 70 - 99 mg/dL    Comment: Glucose reference range applies only to samples taken after fasting for at least 8 hours.  Basic metabolic panel with GFR     Status: Abnormal   Collection Time: 02/20/24  5:12 AM  Result Value Ref Range   Sodium 138 135 - 145 mmol/L   Potassium 4.5 3.5 - 5.1  mmol/L   Chloride 106 98 - 111 mmol/L   CO2 22 22 - 32 mmol/L   Glucose, Bld 111 (H) 70 - 99 mg/dL    Comment: Glucose reference range applies only to samples taken after fasting for at least 8 hours.   BUN 18 8 - 23 mg/dL   Creatinine, Ser 9.10 0.44 - 1.00 mg/dL   Calcium  8.8 (L) 8.9 - 10.3 mg/dL   GFR, Estimated >39 >39 mL/min    Comment: (NOTE) Calculated using the CKD-EPI Creatinine Equation (2021)    Anion gap 10 5 - 15    Comment: Performed at [facility], [address], [place], [place] [address]  Procalcitonin     Status: None   Collection Time: 02/20/24  5:12 AM  Result Value Ref Range   Procalcitonin 0.13 ng/mL    Comment:        Interpretation: PCT (Procalcitonin) <= 0.5 ng/mL: Systemic infection (sepsis) is not likely. Local bacterial infection is possible. (NOTE)       Sepsis PCT Algorithm           Lower Respiratory Tract                                      Infection PCT Algorithm    ----------------------------     ----------------------------         PCT < 0.25 ng/mL                PCT < 0.10 ng/mL          Strongly encourage             Strongly discourage   discontinuation of antibiotics    initiation of antibiotics    ----------------------------     -----------------------------       PCT 0.25 - 0.50 ng/mL            PCT 0.10 - 0.25 ng/mL               OR       >  80% decrease in PCT            Discourage initiation of                                            antibiotics      Encourage discontinuation           of antibiotics    ----------------------------     -----------------------------         PCT >= 0.50 ng/mL              PCT 0.26 - 0.50 ng/mL               AND        <80% decrease in PCT             Encourage initiation of                                             antibiotics       Encourage continuation           of antibiotics    ----------------------------     -----------------------------        PCT >= 0.50 ng/mL                   PCT > 0.50 ng/mL               AND         increase in PCT                  Strongly encourage                                      initiation of antibiotics    Strongly encourage escalation           of antibiotics                                     -----------------------------                                           PCT <= 0.25 ng/mL                                                 OR                                        > 80% decrease in PCT                                      Discontinue / Do not initiate  antibiotics  Performed at [facility], [address], [place], [place] [address]   CBC     Status: Abnormal   Collection Time: 02/20/24  9:10 AM  Result Value Ref Range   WBC 12.9 (H) 4.0 - 10.5 K/uL   RBC 3.38 (L) 3.87 - 5.11 MIL/uL   Hemoglobin 10.6 (L) 12.0 - 15.0 g/dL   HCT 69.0 (L) 63.9 - 53.9 %   MCV 91.4 80.0 - 100.0 fL   MCH 31.4 26.0 - 34.0 pg   MCHC 34.3 30.0 - 36.0 g/dL   RDW 86.7 88.4 - 84.4 %   Platelets 270 150 - 400 K/uL   nRBC 0.0 0.0 - 0.2 %    Comment: Performed at [facility], [address] [place], [place] [address]  Culture, blood (Routine X 2) w Reflex to ID Panel     Status: None (Preliminary result)   Collection Time: 02/20/24  9:21 AM   Specimen: BLOOD RIGHT HAND  Result Value Ref Range   Specimen Description BLOOD RIGHT HAND    Special Requests      BOTTLES DRAWN AEROBIC ONLY Blood Culture adequate volume   Culture  Setup Time      GRAM NEGATIVE RODS AEROBIC BOTTLE ONLY CRITICAL RESULT CALLED TO, READ BACK BY AND VERIFIED WITH: PHARMD J LEDFORD 02/21/2024 @ 0545 BY AB Performed at [facility], [address], [place], [place] [address]    Culture GRAM NEGATIVE RODS    Report Status PENDING   Blood Culture ID Panel (Reflexed)     Status: Abnormal   Collection Time: 02/20/24  9:21 AM  Result Value Ref Range   Enterococcus faecalis NOT DETECTED NOT DETECTED    Enterococcus Faecium NOT DETECTED NOT DETECTED   Listeria monocytogenes NOT DETECTED NOT DETECTED   Staphylococcus species NOT DETECTED NOT DETECTED   Staphylococcus aureus (BCID) NOT DETECTED NOT DETECTED   Staphylococcus epidermidis NOT DETECTED NOT DETECTED   Staphylococcus lugdunensis NOT DETECTED NOT DETECTED   Streptococcus species NOT DETECTED NOT DETECTED   Streptococcus agalactiae NOT DETECTED NOT DETECTED   Streptococcus pneumoniae NOT DETECTED NOT DETECTED   Streptococcus pyogenes NOT DETECTED NOT DETECTED   A.calcoaceticus-baumannii NOT DETECTED NOT DETECTED   Bacteroides fragilis NOT DETECTED NOT DETECTED   Enterobacterales DETECTED (A) NOT DETECTED    Comment: Enterobacterales represent a large order of gram negative bacteria, not a single organism. CRITICAL RESULT CALLED TO, READ BACK BY AND VERIFIED WITH: PHARMD J LEDFORD 02/21/2024 @ 0545 BY AB    Enterobacter cloacae complex DETECTED (A) NOT DETECTED    Comment: CRITICAL RESULT CALLED TO, READ BACK BY AND VERIFIED WITH: PHARMD J LEDFORD 02/21/2024 @ 0545 BY AB    Escherichia coli NOT DETECTED NOT DETECTED   Klebsiella aerogenes NOT DETECTED NOT DETECTED   Klebsiella oxytoca NOT DETECTED NOT DETECTED   Klebsiella pneumoniae NOT DETECTED NOT DETECTED   Proteus species NOT DETECTED NOT DETECTED   Salmonella species NOT DETECTED NOT DETECTED   Serratia marcescens NOT DETECTED NOT DETECTED   Haemophilus influenzae NOT DETECTED NOT DETECTED   Neisseria meningitidis NOT DETECTED NOT DETECTED   Pseudomonas aeruginosa NOT DETECTED NOT DETECTED   Stenotrophomonas maltophilia NOT DETECTED NOT DETECTED   Candida albicans NOT DETECTED NOT DETECTED   Candida auris NOT DETECTED NOT DETECTED   Candida glabrata NOT DETECTED NOT DETECTED   Candida krusei NOT DETECTED NOT DETECTED   Candida parapsilosis NOT DETECTED NOT DETECTED   Candida tropicalis  NOT DETECTED NOT DETECTED   Cryptococcus neoformans/gattii NOT DETECTED NOT DETECTED    CTX-M ESBL NOT DETECTED NOT DETECTED   Carbapenem resistance IMP NOT DETECTED NOT DETECTED   Carbapenem resistance KPC NOT DETECTED NOT DETECTED   Carbapenem resistance NDM NOT DETECTED NOT DETECTED   Carbapenem resist OXA 48 LIKE NOT DETECTED NOT DETECTED   Carbapenem resistance VIM NOT DETECTED NOT DETECTED    Comment: Performed at [facility], [address] [place], [place] [address]  Culture, blood (Routine X 2) w Reflex to ID Panel     Status: None (Preliminary result)   Collection Time: 02/20/24  9:22 AM   Specimen: BLOOD RIGHT ARM  Result Value Ref Range   Specimen Description BLOOD RIGHT ARM    Special Requests      BOTTLES DRAWN AEROBIC ONLY Blood Culture results may not be optimal due to an inadequate volume of blood received in culture bottles   Culture      NO GROWTH 1 DAY Performed at [facility], [address] [place], [place] [address]    Report Status PENDING   Glucose, capillary     Status: Abnormal   Collection Time: 02/20/24 10:04 AM  Result Value Ref Range   Glucose-Capillary 164 (H) 70 - 99 mg/dL    Comment: Glucose reference range applies only to samples taken after fasting for at least 8 hours.  Glucose, capillary     Status: Abnormal   Collection Time: 02/20/24 12:46 PM  Result Value Ref Range   Glucose-Capillary 144 (H) 70 - 99 mg/dL    Comment: Glucose reference range applies only to samples taken after fasting for at least 8 hours.  Glucose, capillary     Status: Abnormal   Collection Time: 02/20/24  6:13 PM  Result Value Ref Range   Glucose-Capillary 180 (H) 70 - 99 mg/dL    Comment: Glucose reference range applies only to samples taken after fasting for at least 8 hours.  Glucose, capillary     Status: Abnormal   Collection Time: 02/20/24  8:18 PM  Result Value Ref Range   Glucose-Capillary 208 (H) 70 - 99 mg/dL    Comment: Glucose reference range applies only to samples taken after fasting for at least 8 hours.  Basic metabolic  panel with GFR     Status: Abnormal   Collection Time: 02/21/24  4:24 AM  Result Value Ref Range   Sodium 138 135 - 145 mmol/L   Potassium 4.0 3.5 - 5.1 mmol/L   Chloride 104 98 - 111 mmol/L   CO2 22 22 - 32 mmol/L   Glucose, Bld 128 (H) 70 - 99 mg/dL    Comment: Glucose reference range applies only to samples taken after fasting for at least 8 hours.   BUN 21 8 - 23 mg/dL   Creatinine, Ser 8.97 (H) 0.44 - 1.00 mg/dL   Calcium  8.6 (L) 8.9 - 10.3 mg/dL   GFR, Estimated 59 (L) >60 mL/min    Comment: (NOTE) Calculated using the CKD-EPI Creatinine Equation (2021)    Anion gap 12 5 - 15    Comment: Performed at [facility], [address] [place], [place] [address]  CBC     Status: Abnormal   Collection Time: 02/21/24  4:24 AM  Result Value Ref Range   WBC 15.7 (H) 4.0 - 10.5 K/uL   RBC 3.73 (L) 3.87 - 5.11 MIL/uL   Hemoglobin 11.7 (L) 12.0 - 15.0 g/dL  HCT 36.1 36.0 - 46.0 %   MCV 96.8 80.0 - 100.0 fL   MCH 31.4 26.0 - 34.0 pg   MCHC 32.4 30.0 - 36.0 g/dL   RDW 86.5 88.4 - 84.4 %   Platelets 315 150 - 400 K/uL   nRBC 0.0 0.0 - 0.2 %    Comment: Performed at [facility], [address] [place], [place] [address]  Glucose, capillary     Status: Abnormal   Collection Time: 02/21/24  8:14 AM  Result Value Ref Range   Glucose-Capillary 129 (H) 70 - 99 mg/dL    Comment: Glucose reference range applies only to samples taken after fasting for at least 8 hours.   No components found for: ESR, C REACTIVE PROTEIN MICRO: Recent Results (from the past 720 hours)  Surgical pcr screen     Status: None   Collection Time: 02/03/24  1:33 PM   Specimen: Nasal Mucosa; Nasal Swab  Result Value Ref Range Status   MRSA, PCR NEGATIVE NEGATIVE Final   Staphylococcus aureus NEGATIVE NEGATIVE Final    Comment: (NOTE) The Xpert SA Assay (FDA approved for NASAL specimens in patients 75 years of age and older), is one component of a comprehensive surveillance program. It is not  intended to diagnose infection nor to guide or monitor treatment. Performed at [facility], [address] [place], [place] [address]   Culture, blood (Routine X 2) w Reflex to ID Panel     Status: None (Preliminary result)   Collection Time: 02/20/24  9:21 AM   Specimen: BLOOD RIGHT HAND  Result Value Ref Range Status   Specimen Description BLOOD RIGHT HAND  Final   Special Requests   Final    BOTTLES DRAWN AEROBIC ONLY Blood Culture adequate volume   Culture  Setup Time   Final    GRAM NEGATIVE RODS AEROBIC BOTTLE ONLY CRITICAL RESULT CALLED TO, READ BACK BY AND VERIFIED WITH: PHARMD J LEDFORD 02/21/2024 @ 0545 BY AB Performed at [facility], [address] [place], [place] [address]    Culture GRAM NEGATIVE RODS  Final   Report Status PENDING  Incomplete  Blood Culture ID Panel (Reflexed)     Status: Abnormal   Collection Time: 02/20/24  9:21 AM  Result Value Ref Range Status   Enterococcus faecalis NOT DETECTED NOT DETECTED Final   Enterococcus Faecium NOT DETECTED NOT DETECTED Final   Listeria monocytogenes NOT DETECTED NOT DETECTED Final   Staphylococcus species NOT DETECTED NOT DETECTED Final   Staphylococcus aureus (BCID) NOT DETECTED NOT DETECTED Final   Staphylococcus epidermidis NOT DETECTED NOT DETECTED Final   Staphylococcus lugdunensis NOT DETECTED NOT DETECTED Final   Streptococcus species NOT DETECTED NOT DETECTED Final   Streptococcus agalactiae NOT DETECTED NOT DETECTED Final   Streptococcus pneumoniae NOT DETECTED NOT DETECTED Final   Streptococcus pyogenes NOT DETECTED NOT DETECTED Final   A.calcoaceticus-baumannii NOT DETECTED NOT DETECTED Final   Bacteroides fragilis NOT DETECTED NOT DETECTED Final   Enterobacterales DETECTED (A) NOT DETECTED Final    Comment: Enterobacterales represent a large order of gram negative bacteria, not a single organism. CRITICAL RESULT CALLED TO, READ BACK BY AND VERIFIED WITH: PHARMD J LEDFORD 02/21/2024 @ 0545  BY AB    Enterobacter cloacae complex DETECTED (A) NOT DETECTED Final    Comment: CRITICAL RESULT CALLED TO, READ BACK BY AND VERIFIED WITH: PHARMD J LEDFORD 02/21/2024 @ 0545 BY AB    Escherichia coli NOT DETECTED  NOT DETECTED Final   Klebsiella aerogenes NOT DETECTED NOT DETECTED Final   Klebsiella oxytoca NOT DETECTED NOT DETECTED Final   Klebsiella pneumoniae NOT DETECTED NOT DETECTED Final   Proteus species NOT DETECTED NOT DETECTED Final   Salmonella species NOT DETECTED NOT DETECTED Final   Serratia marcescens NOT DETECTED NOT DETECTED Final   Haemophilus influenzae NOT DETECTED NOT DETECTED Final   Neisseria meningitidis NOT DETECTED NOT DETECTED Final   Pseudomonas aeruginosa NOT DETECTED NOT DETECTED Final   Stenotrophomonas maltophilia NOT DETECTED NOT DETECTED Final   Candida albicans NOT DETECTED NOT DETECTED Final   Candida auris NOT DETECTED NOT DETECTED Final   Candida glabrata NOT DETECTED NOT DETECTED Final   Candida krusei NOT DETECTED NOT DETECTED Final   Candida parapsilosis NOT DETECTED NOT DETECTED Final   Candida tropicalis NOT DETECTED NOT DETECTED Final   Cryptococcus neoformans/gattii NOT DETECTED NOT DETECTED Final   CTX-M ESBL NOT DETECTED NOT DETECTED Final   Carbapenem resistance IMP NOT DETECTED NOT DETECTED Final   Carbapenem resistance KPC NOT DETECTED NOT DETECTED Final   Carbapenem resistance NDM NOT DETECTED NOT DETECTED Final   Carbapenem resist OXA 48 LIKE NOT DETECTED NOT DETECTED Final   Carbapenem resistance VIM NOT DETECTED NOT DETECTED Final    Comment: Performed at [facility], [address] [place], [place] [address]  Culture, blood (Routine X 2) w Reflex to ID Panel     Status: None (Preliminary result)   Collection Time: 02/20/24  9:22 AM   Specimen: BLOOD RIGHT ARM  Result Value Ref Range Status   Specimen Description BLOOD RIGHT ARM  Final   Special Requests   Final    BOTTLES DRAWN AEROBIC ONLY Blood Culture results may  not be optimal due to an inadequate volume of blood received in culture bottles   Culture   Final    NO GROWTH 1 DAY Performed at [facility], [address], [place], [place] [address]    Report Status PENDING  Incomplete    IMAGING: DG Chest Port 1 View Result Date: 02/20/2024 CLINICAL DATA:  Fever. EXAM: PORTABLE CHEST 1 VIEW COMPARISON:  March 12, 2019. FINDINGS: Stable cardiomediastinal silhouette. Both lungs are clear. The visualized skeletal structures are unremarkable. IMPRESSION: No active disease. Electronically Signed   By: Lynwood Landy Raddle M.D.   On: 02/20/2024 09:20   MR BRAIN W WO CONTRAST Result Date: 02/19/2024 CLINICAL DATA:  Headache, increasing frequency or severity EXAM: MRI HEAD WITHOUT AND WITH CONTRAST TECHNIQUE: Multiplanar, multiecho pulse sequences of the brain and surrounding structures were obtained without and with intravenous contrast. CONTRAST:  9mL GADAVIST  GADOBUTROL  1 MMOL/ML IV SOLN COMPARISON:  CT head 02/14/2024 FINDINGS: Brain: Multiple small foci of T1 hyperintensity in the suprasellar region and in the right foramen of Luschka and scattered along the posterior aspect of the cerebellum and along the paramidline supratentorial extra-axial spaces, correlating with finding seen on recent CT head. No evidence of brain edema, acute infarct, hydrocephalus, or abnormal enhancement. Vascular: Major arterial flow voids are maintained skull base. Skull and upper cervical spine: Normal marrow signal. Sinuses/Orbits: Clear sinuses.  No acute orbital findings. IMPRESSION: 1. Many small T1 hyperintensities in the infratentorial and supratentorial extra-axial spaces, correlating with findings on recent CT head and compatible with lipid material. 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 2. No evidence of complicating features. Specifically, no brain edema, acute infarct, hydrocephalus, or abnormal enhancement. Electronically Signed   By: Gilmore GORMAN Molt M.D.   On:  02/19/2024 21:03    MR LUMBAR SPINE WO CONTRAST Result Date: 02/15/2024 CLINICAL DATA:  73 year old female with pain status post surgery postoperative day 7. Persistent headache, and noncontrast head CT suggesting lipid migration in the CSF space to the basilar cisterns. Query CSF leak. EXAM: MRI LUMBAR SPINE WITHOUT CONTRAST TECHNIQUE: Multiplanar, multisequence MR imaging of the lumbar spine was performed. No intravenous contrast was administered. COMPARISON:  Preoperative lumbar MRI 11/20/2023. Intraoperative images 02/08/2024. FINDINGS: Segmentation: Normal on prior lumbar radiographs, the same numbering system used on the June MRI. Alignment: Stable grade 1 anterolisthesis of L4 on L5, stable vertebral height and alignment elsewhere. Vertebrae: New postoperative changes, mild hardware susceptibility artifact at L4-L5. See details below. Normal background bone marrow signal. Maintained vertebral height. Intact visible sacrum. No marrow edema or evidence of acute osseous abnormality. Conus medullaris and cauda equina: Conus extends to the L1 level, although detail is obscured now due to heterogeneous intraspinal fluid collections. These appear to be in the subdural spaces surrounding the thecal sac, dorsal more so than ventral. See series 8, image 1, 7, 13. Involvement seen from the visible lower thoracic spine to the surgery level at L4-L5. And some areas of pronounced signal loss within the collection (series 8, image 18) which might be gas rather than blood (at the L3 vertebral level series 8, image 18). Subsequent compression of the thecal sac, which is also exacerbated by underlying spine degeneration. Paraspinal and other soft tissues: Postoperative changes at L4-L5 including evidence of laminectomy. Posterior and interbody fusion hardware in place. Right erector spinae muscle fairly CSF isointense fluid collection encompassing 31 x 32 x 42 mm (AP by transverse by CC), volume estimated 21 mL). And there appears to be a  tenuous connection from that collection into the subcutaneous space (overlying the L3 spinous process on series 8, image 18). Larger subcutaneous space collection encompasses 37 by 64 x 99 mm (AP by transverse by CC), estimated volume 117 mL. This tracks both cephalad and caudal from the level of surgery. Additional more generalized subcutaneous edema. Negative visible abdominal viscera. Disc levels: From the visible lower thoracic levels to L2-L3 mass effect on the thecal sac with crowding of the cauda equina nerve roots is primarily due to asymmetric circumferential subdural fluid collection, possibly with some gas. At L2-L3 and L3-L4 there is some superimposed disc bulging and moderate facet and ligament flavum hypertrophy, stable from the preoperative CT. At L4-L5 postoperative changes and architectural distortion with mild hardware susceptibility artifact. Residual broad-based right paracentral disc seen on series 5, image 7. Pre-existing spinal stenosis at this level does appear somewhat improved. Pre-existing moderate right L4 foraminal stenosis, might not be significantly changed. L5-S1: Cauda equina here appears stable and normal. Chronic disc and endplate degeneration asymmetric to the right is stable. IMPRESSION: 1. Recent L4-L5 decompression and fusion. Constellation of imaging findings suggestive of CSF leak: Asymmetric circumferential and heterogeneous subdural space fluid collection tracking from the level of surgery cephalad into the visible lower thoracic spine, compressing the thecal sac in areas. Possible small volume gas also within that collection. Superimposed small volume Right paraspinal muscle fluid collection at the level of posterior hardware - and although postoperative seroma would appear similar - that collection appears to communicate with a larger estimated 117 mL subcutaneous fluid collection in the midline. 2. L5-S1 level appears unchanged from pre operative MRI. And stable  underlying lumbar spine degeneration elsewhere. Electronically Signed   By: VEAR Hurst M.D.   On: 02/15/2024 10:53  CT Head Wo Contrast Addendum Date: 02/14/2024 ADDENDUM REPORT: 02/14/2024 13:12 ADDENDUM: Study discussed by telephone with Dr. SCOTT ZACKOWSKI on 02/14/2024 at 1300 hours. Electronically Signed   By: VEAR Hurst M.D.   On: 02/14/2024 13:12   Result Date: 02/14/2024 CLINICAL DATA:  73 year old female with extreme headache. Recent spine surgery. EXAM: CT HEAD WITHOUT CONTRAST TECHNIQUE: Contiguous axial images were obtained from the base of the skull through the vertex without intravenous contrast. RADIATION DOSE REDUCTION: This exam was performed according to the departmental dose-optimization program which includes automated exposure control, adjustment of the mA and/or kV according to patient size and/or use of iterative reconstruction technique. COMPARISON:  Head CT 03/14/2019. FINDINGS: Brain: Basilar cisterns appear patent, but there is abnormal lipomatous density (-60 Hounsfield units) in the suprasellar and interpeduncular cisterns, new since 2020 (sagittal image 28). Occasional punctate lipomatous density identified elsewhere in the posterior fossa (series 3, images 5, 10, 11). And small CSF density sub tentorial posterior fossa effusion is new (coronal image 24). But no significant brainstem mass effect. Fourth ventricle size within normal limits. No ventriculomegaly. No hyperdense intracranial hemorrhage is identified. No intracranial midline shift or mass effect. Maintained gray-white differentiation. No cortically based acute infarct identified. Vascular: No suspicious intracranial vascular hyperdensity. Skull: Appears intact.  No acute osseous abnormality identified. Sinuses/Orbits: Visualized paranasal sinuses and mastoids are clear. Other: Stable orbit and scalp soft tissues. IMPRESSION: 1. Abnormal intracranial lipid material, new since 2020 and most concentrated in the suprasellar and  interpeduncular cisterns, but tiny foci elsewhere in the posterior fossa. Superimposed small new CSF density sub-tentorial posterior fossa effusions also. 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 2. No other complicating features; no significant intracranial mass effect, no ventriculomegaly, no acute intracranial hemorrhage or infarct identified. Electronically Signed: By: VEAR Hurst M.D. On: 02/14/2024 12:59   DG Lumbar Spine 2-3 Views Result Date: 02/08/2024 CLINICAL DATA:  Elective surgery. EXAM: LUMBAR SPINE - 2-3 VIEW COMPARISON:  Preoperative imaging FINDINGS: Two fluoroscopic spot views of the lumbar spine submitted from the operating room. Posterior rod and pedicle screw fixation with interbody spacer at L4-L5. Fluoroscopy time 30.6 seconds. Dose 33.84 mGy. IMPRESSION: Intraoperative fluoroscopy during lumbar fusion. Electronically Signed   By: Andrea Gasman M.D.   On: 02/08/2024 17:49   DG C-Arm 1-60 Min-No Report Result Date: 02/08/2024 Fluoroscopy was utilized by the requesting physician.  No radiographic interpretation.   DG C-Arm 1-60 Min-No Report Result Date: 02/08/2024 Fluoroscopy was utilized by the requesting physician.  No radiographic interpretation.    Assessment:   Diane Watkins is a 73 y.o. female multiple medical problems recent lumbar fusion admitted with headache and found to have CSF leak.  She had repair done but then developed fever and was found to have Enterobacter cloacae bacteremia without an obvious source.  Urine sample was neg but no cx sent (UA neg) September 5.  She does have some GI symptoms.  She has some nausea vomiting last night and had initial constipation but now with some diarrhea after an enema. Wound per report is stable. Has a mild phlebitis at IV site as well.  Recommendations Cont cefepime  pending sensis Check lfts, lipase. If GI sxs continue or elevated lfts lipase would image abd FU MRI Hip and L spine If fevers recur low threshold to CT  abd pelvis  Thank you very much for allowing me to participate in the care of this patient. Please call with questions.   Alm SQUIBB. Epifanio, MD

## 2024-02-21 NOTE — Evaluation (Signed)
 Physical Therapy Evaluation Patient Details Name: Diane Watkins MRN: [id number] DOB: [birth year] Today's Date: 02/21/2024  History of Present Illness  Pt is a 73 y/o F presenting to ED 8/31 with persistent headache.  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 MRI showing persistent leak, underwent exploration of the lumbar wound with closure of dural defect on 9/3. PMH of HTN, HLD, DM2, GERD, and a recent transforaminal lumbar decompression and fusion at L4-5 on 02/08/2024   Clinical Impression  Pt in bed upon arrival of PT, agreeable to evaluation at this time. Prior to admission the pt was ambulating with RW with family supervision to progress distance after back surgery, and able to complete ADLs with increased time but no assistance. The pt presents today with limitations due to pain and need for modA of 2 for all bed mobility and transfers. Pt with pain in R hip, back, and head, but reports sharp pains in her head increase with upright activity and ~6 ft ambulation from [facility] to bed. Pt reports headache pain resolves once she returned to supine in bed. The pt is highly motivated to progress activity tolerance, stability, and independence with transfers and gait back to pre-surgical level of independence, will benefit from intensive post-acute therapies >3hours/day once medically stable to facilitate return to maximal level of independence.     If plan is discharge home, recommend the following: Two people to help with walking and/or transfers;Two people to help with bathing/dressing/bathroom;Assistance with cooking/housework;Direct supervision/assist for medications management;Direct supervision/assist for financial management;Assist for transportation;Help with stairs or ramp for entrance;Supervision due to cognitive status    Can travel by private vehicle        Equipment Recommendations Rolling walker (2 wheels)  Recommendations for Other Services  Rehab consult    Functional Status Assessment Patient has had a recent decline in their functional status and demonstrates the ability to make significant improvements in function in a reasonable and predictable amount of time.     Precautions / Restrictions Precautions Precautions: Back;Fall Precaution Booklet Issued: No Recall of Precautions/Restrictions: Impaired Precaution/Restrictions Comments: verbally reviewed back prec Required Braces or Orthoses: Other Brace Other Brace: no brace needed per orders Restrictions Weight Bearing Restrictions Per Provider Order: No      Mobility  Bed Mobility Overal bed mobility: Needs Assistance Bed Mobility: Sidelying to Sit, Rolling, Sit to Sidelying Rolling: Mod assist Sidelying to sit: Mod assist, +2 for physical assistance     Sit to sidelying: Mod assist, +2 for physical assistance General bed mobility comments: assist to manage LE and trunk    Transfers Overall transfer level: Needs assistance Equipment used: Rolling walker (2 wheels) Transfers: Sit to/from Stand, Bed to chair/wheelchair/BSC Sit to Stand: Mod assist, +2 physical assistance, Min assist Stand pivot transfers: Mod assist, +2 physical assistance, Min assist         General transfer comment: cues for hand placement, slow to rise. increased frequency of sharp head pain when upright    Ambulation/Gait Ambulation/Gait assistance: Mod assist, +2 physical assistance, +2 safety/equipment Gait Distance (Feet): 6 Feet Assistive device: Rolling walker (2 wheels) Gait Pattern/deviations: Step-through pattern, Decreased stride length Gait velocity: decreased Gait velocity interpretation: <1.31 ft/sec, indicative of household ambulator   General Gait Details: increased frequency of sharp head pains, max cues and facilitation to step  backwards, pt with small, guarded steps with  minimal clearance     Balance Overall balance assessment: Needs assistance Sitting-balance support: Feet supported Sitting balance-Leahy Scale: Good     Standing balance support: Bilateral upper extremity supported Standing balance-Leahy Scale: Poor Standing balance comment: dependent on BUE support                             Pertinent Vitals/Pain Pain Assessment Pain Assessment: Faces Faces Pain Scale: Hurts whole lot Pain Location: back, RLE, and sharp headache pain that increased frequency with OOB mobility Pain Descriptors / Indicators: Aching, Headache Pain Intervention(s): Monitored during session, Limited activity within patient's tolerance, Repositioned    Home Living Family/patient expects to be discharged to:: Private residence Living Arrangements: Spouse/significant other Available Help at Discharge: Family Type of Home: House Home Access: Stairs to enter Entrance Stairs-Rails: Doctor, general practice of Steps: 4   Home Layout: One level;Other (Comment) Home Equipment: Toilet riser;Rollator (4 wheels);Cane - single point;Tub bench      Prior Function Prior Level of Function : Independent/Modified Independent             Mobility Comments: mobilizing with RW, no falls, progressing distance with family ADLs Comments: incr time but ind     Extremity/Trunk Assessment   Upper Extremity Assessment Upper Extremity Assessment: Defer to OT evaluation;Generalized weakness (significant tremors starting in RUE)    Lower Extremity Assessment Lower Extremity Assessment: Generalized weakness (pt able to maintain against MMT, reports no change in sensation)    Cervical / Trunk Assessment Cervical / Trunk Assessment: Back Surgery  Communication   Communication Communication: No apparent difficulties    Cognition Arousal: Alert Behavior During Therapy: WFL for tasks assessed/performed   PT -  Cognitive impairments: Awareness, Safety/Judgement                       PT - Cognition Comments: pt needing increased time and cues, generally with poor insight as pt asking to walk further despite instability, fatigue, reports of feeling drunk, and reporting feeling more frequent sharp head pains when standing Following commands: Intact       Cueing Cueing Techniques: Verbal cues     General Comments General comments (skin integrity, edema, etc.): family present, RN notified of blood in stool and increased frequency of sharp head pain with upright    Exercises     Assessment/Plan    PT Assessment Patient needs continued PT services  PT Problem List Decreased strength;Decreased activity tolerance;Decreased mobility;Decreased balance;Pain       PT Treatment Interventions DME instruction;Gait training;Functional mobility training;Stair training;Therapeutic activities;Therapeutic exercise;Balance training;Patient/family education    PT Goals (Current goals can be found in the Care Plan section)  Acute Rehab PT Goals Patient Stated Goal: to reduce pain, get outof bed PT Goal Formulation: With patient Time For Goal Achievement: 03/06/24 Potential to Achieve Goals: Good    Frequency Min 5X/week     Co-evaluation PT/OT/SLP Co-Evaluation/Treatment: Yes Reason for Co-Treatment: Necessary to address cognition/behavior during functional activity;Complexity of the patient's impairments (multi-system involvement);To address functional/ADL transfers;For patient/therapist safety PT goals addressed during session: Mobility/safety with mobility;Proper use of DME;Balance;Strengthening/ROM OT goals addressed during session: ADL's and self-care;Strengthening/ROM       AM-PAC PT 6 Clicks Mobility  Outcome Measure Help needed turning from your back to your side while in a flat bed without using bedrails?: A Lot Help needed moving from lying on your back to sitting on the side of  a  flat bed without using bedrails?: A Lot Help needed moving to and from a bed to a chair (including a wheelchair)?: Total Help needed standing up from a chair using your arms (e.g., wheelchair or bedside chair)?: Total Help needed to walk in hospital room?: Total (<20 ft) Help needed climbing 3-5 steps with a railing? : Total 6 Click Score: 8    End of Session Equipment Utilized During Treatment: Gait belt Activity Tolerance: Patient limited by pain Patient left: in bed;with Watkins bell/phone within reach;with bed alarm set;with family/visitor present Nurse Communication: Mobility status PT Visit Diagnosis: Pain;Difficulty in walking, not elsewhere classified (R26.2) Pain - part of body:  (headache)    Time: 8581-8544 PT Time Calculation (min) (ACUTE ONLY): 37 min   Charges:   PT Evaluation $PT Eval Moderate Complexity: 1 Mod   PT General Charges $$ ACUTE PT VISIT: 1 Visit         Diane Watkins, PT, DPT   Acute Rehabilitation Department Office [phone] Secure Chat Communication Preferred  Diane Watkins 02/21/2024, 5:50 PM

## 2024-02-21 NOTE — Progress Notes (Signed)
 PHARMACY - PHYSICIAN COMMUNICATION CRITICAL VALUE ALERT - BLOOD CULTURE IDENTIFICATION (BCID)  Diane Watkins is an 73 y.o. female who presented to [facility] on 02/14/2024   Name of physician (or Provider) Contacted: A. Andrez  Current antibiotics: Keflex   Changes to prescribed antibiotics recommended:  DC Keflex  Start cefepime  2g IV q1h  Results for orders placed or performed during the hospital encounter of 02/14/24  Blood Culture ID Panel (Reflexed) (Collected: 02/20/2024  9:21 AM)  Result Value Ref Range   Enterococcus faecalis NOT DETECTED NOT DETECTED   Enterococcus Faecium NOT DETECTED NOT DETECTED   Listeria monocytogenes NOT DETECTED NOT DETECTED   Staphylococcus species NOT DETECTED NOT DETECTED   Staphylococcus aureus (BCID) NOT DETECTED NOT DETECTED   Staphylococcus epidermidis NOT DETECTED NOT DETECTED   Staphylococcus lugdunensis NOT DETECTED NOT DETECTED   Streptococcus species NOT DETECTED NOT DETECTED   Streptococcus agalactiae NOT DETECTED NOT DETECTED   Streptococcus pneumoniae NOT DETECTED NOT DETECTED   Streptococcus pyogenes NOT DETECTED NOT DETECTED   A.calcoaceticus-baumannii NOT DETECTED NOT DETECTED   Bacteroides fragilis NOT DETECTED NOT DETECTED   Enterobacterales DETECTED (A) NOT DETECTED   Enterobacter cloacae complex DETECTED (A) NOT DETECTED   Escherichia coli NOT DETECTED NOT DETECTED   Klebsiella aerogenes NOT DETECTED NOT DETECTED   Klebsiella oxytoca NOT DETECTED NOT DETECTED   Klebsiella pneumoniae NOT DETECTED NOT DETECTED   Proteus species NOT DETECTED NOT DETECTED   Salmonella species NOT DETECTED NOT DETECTED   Serratia marcescens NOT DETECTED NOT DETECTED   Haemophilus influenzae NOT DETECTED NOT DETECTED   Neisseria meningitidis NOT DETECTED NOT DETECTED   Pseudomonas aeruginosa NOT DETECTED NOT DETECTED   Stenotrophomonas maltophilia NOT DETECTED NOT DETECTED   Candida albicans NOT DETECTED NOT DETECTED   Candida auris NOT  DETECTED NOT DETECTED   Candida glabrata NOT DETECTED NOT DETECTED   Candida krusei NOT DETECTED NOT DETECTED   Candida parapsilosis NOT DETECTED NOT DETECTED   Candida tropicalis NOT DETECTED NOT DETECTED   Cryptococcus neoformans/gattii NOT DETECTED NOT DETECTED   CTX-M ESBL NOT DETECTED NOT DETECTED   Carbapenem resistance IMP NOT DETECTED NOT DETECTED   Carbapenem resistance KPC NOT DETECTED NOT DETECTED   Carbapenem resistance NDM NOT DETECTED NOT DETECTED   Carbapenem resist OXA 48 LIKE NOT DETECTED NOT DETECTED   Carbapenem resistance VIM NOT DETECTED NOT DETECTED    Clair Agent 02/21/2024  5:54 AM

## 2024-02-21 NOTE — Evaluation (Addendum)
 Occupational Therapy Evaluation Patient Details Name: Diane Watkins MRN: [id number] DOB: 1950/09/13 Today's Date: 02/21/2024   History of Present Illness   Pt is a 73 y/o F presenting to ED 8/31 with persistent headache.  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 MRI showing persistent leak, underwent exploration of the lumbar wound with closure of dural defect on 9/3. PMH of HTN, HLD, DM2, GERD, and a recent transforaminal lumbar decompression and fusion at L4-5 on 02/08/2024     Clinical Impressions Pt ind at baseline with ADLs/functional mobility, lives with spouse PTA. Pt currently needing up to mod A fro ADLs, mod +2 for bed mobility and mod +2 for transfers with RW. Pt with incr headache when sitting and standing vs supine position, but reports headache is shooting but intermittent. Pt able to transfer to [facility] and have BM (blood in stool, RN notified). Pt with incr headache and tremoring, appears to originate in RUE, with standing and transferring back to bed. Pt presenting with impairments listed below, will follow acutely. Patient will benefit from intensive inpatient follow-up therapy, >3 hours/day to maximize safety/ind with ADL/functional mobility.      If plan is discharge home, recommend the following:   A lot of help with walking and/or transfers;A lot of help with bathing/dressing/bathroom;Assistance with cooking/housework;Direct supervision/assist for medications management;Direct supervision/assist for financial management;Assist for transportation;Help with stairs or ramp for entrance     Functional Status Assessment   Patient has had a recent decline in their functional status and demonstrates the ability to make significant improvements in function in a reasonable and predictable amount of  time.     Equipment Recommendations   Other (comment) (defer)     Recommendations for Other Services   PT consult     Precautions/Restrictions   Precautions Precautions: Back;Fall Precaution Booklet Issued: No Recall of Precautions/Restrictions: Impaired Precaution/Restrictions Comments: verbally reviewed back prec Required Braces or Orthoses: Other Brace Other Brace: no brace needed per orders Restrictions Weight Bearing Restrictions Per Provider Order: No     Mobility Bed Mobility Overal bed mobility: Needs Assistance Bed Mobility: Sidelying to Sit   Sidelying to sit: Mod assist, +2 for physical assistance            Transfers Overall transfer level: Needs assistance Equipment used: Rolling walker (2 wheels) Transfers: Sit to/from Stand, Bed to chair/wheelchair/BSC Sit to Stand: Mod assist, +2 physical assistance, Min assist Stand pivot transfers: Mod assist, +2 physical assistance, Min assist                Balance Overall balance assessment: Needs assistance Sitting-balance support: Feet supported Sitting balance-Leahy Scale: Good     Standing balance support: Bilateral upper extremity supported Standing balance-Leahy Scale: Poor                             ADL either performed or assessed with clinical judgement   ADL Overall ADL's : Needs assistance/impaired Eating/Feeding: Minimal assistance;Sitting   Grooming: Minimal assistance;Sitting   Upper Body Bathing: Moderate assistance;Sitting   Lower Body Bathing: Moderate assistance;Sitting/lateral leans   Upper Body Dressing : Moderate assistance;Sitting   Lower Body Dressing: Moderate assistance   Toilet Transfer: Moderate assistance;+2 for physical assistance   Toileting- Clothing Manipulation and Hygiene: Minimal assistance       Functional mobility  during ADLs: Minimal assistance;Moderate assistance;+2 for physical assistance       Vision   Vision  Assessment?: No apparent visual deficits     Perception Perception: Not tested       Praxis Praxis: Not tested       Pertinent Vitals/Pain Pain Assessment Pain Assessment: Faces Pain Score: 8  Faces Pain Scale: Hurts whole lot Pain Location: back & RLE Pain Descriptors / Indicators: Aching, Headache Pain Intervention(s): Limited activity within patient's tolerance, Monitored during session, Repositioned     Extremity/Trunk Assessment Upper Extremity Assessment Upper Extremity Assessment: Generalized weakness (tremoring RUE >LUE but also progressing to torso)   Lower Extremity Assessment Lower Extremity Assessment: Defer to PT evaluation   Cervical / Trunk Assessment Cervical / Trunk Assessment: Back Surgery   Communication Communication Communication: No apparent difficulties   Cognition Arousal: Alert Behavior During Therapy: WFL for tasks assessed/performed Cognition: No apparent impairments             OT - Cognition Comments: labile during sessin, painful                 Following commands: Intact       Cueing  General Comments   Cueing Techniques: Verbal cues  VSS on RA   Exercises     Shoulder Instructions      Home Living Family/patient expects to be discharged to:: Private residence Living Arrangements: Spouse/significant other Available Help at Discharge: Family Type of Home: House Home Access: Stairs to enter Secretary/administrator of Steps: 4 Entrance Stairs-Rails: Right;Left Home Layout: One level;Other (Comment)     Bathroom Shower/Tub: Tub/shower unit         Home Equipment: Patent examiner (4 wheels);Cane - single point;Tub bench          Prior Functioning/Environment Prior Level of Function : Independent/Modified Independent             Mobility Comments: mobilizing with RW ADLs Comments: incr time but ind    OT Problem List: Decreased strength;Decreased activity tolerance;Impaired balance (sitting  and/or standing);Decreased knowledge of use of DME or AE;Decreased knowledge of precautions;Pain   OT Treatment/Interventions: Self-care/ADL training;Therapeutic exercise;DME and/or AE instruction;Therapeutic activities;Patient/family education;Balance training      OT Goals(Current goals can be found in the care plan section)   Acute Rehab OT Goals Patient Stated Goal: none stated OT Goal Formulation: With patient Time For Goal Achievement: 03/06/24 Potential to Achieve Goals: Good ADL Goals Pt Will Perform Upper Body Dressing: with contact guard assist;sitting Pt Will Perform Lower Body Dressing: with min assist;sitting/lateral leans;sit to/from stand Pt Will Transfer to Toilet: with min assist;ambulating;regular height toilet Pt Will Perform Tub/Shower Transfer: Tub transfer;Shower transfer;with min assist;ambulating;tub bench;rolling walker   OT Frequency:  Min 2X/week    Co-evaluation PT/OT/SLP Co-Evaluation/Treatment: Yes Reason for Co-Treatment: Necessary to address cognition/behavior during functional activity;Complexity of the patient's impairments (multi-system involvement);To address functional/ADL transfers   OT goals addressed during session: ADL's and self-care;Strengthening/ROM      AM-PAC OT 6 Clicks Daily Activity     Outcome Measure Help from another person eating meals?: A Little Help from another person taking care of personal grooming?: A Little Help from another person toileting, which includes using toliet, bedpan, or urinal?: A Lot Help from another person bathing (including washing, rinsing, drying)?: A Lot Help from another person to put on and taking off regular upper body clothing?: A Little Help from another person to put on and taking off regular lower body clothing?: A Lot  6 Click Score: 15   End of Session Equipment Utilized During Treatment: Gait belt;Rolling walker (2 wheels) Nurse Communication: Mobility status  Activity Tolerance:  Patient tolerated treatment well Patient left: in bed;with call bell/phone within reach;with bed alarm set;with family/visitor present  OT Visit Diagnosis: Unsteadiness on feet (R26.81);Muscle weakness (generalized) (M62.81);Pain                Time: 8582-8546 OT Time Calculation (min): 36 min Charges:  OT General Charges $OT Visit: 1 Visit OT Treatments $Self Care/Home Management : 8-22 mins  Mykaila Blunck K, OTD, OTR/L SecureChat Preferred Acute Rehab [phone]   Laneta POUR Koonce 02/21/2024, 5:03 PM

## 2024-02-21 NOTE — Progress Notes (Signed)
 SUBJECTIVE Still is a lot of anxiety.  Continuously yelling out in pain with any movement.  However she is improving she is able to sit on the bedside commode and sit up which she was not able to do previously.  She did have a fever and source appears to be a bacterial infection, ID is on board and on cefepime   There are some mention of intermittent right handed tremor  OBJECTIVE Most recent Vital Signs: Vitals:   02/21/24 0814 02/21/24 0900 02/21/24 1512 02/21/24 1659  BP: 127/86 131/67 139/66 (!) 135/92  Pulse: (!) 110 (!) 104 96 (!) 118  Resp: 18 18 19 20   Temp: 98 F (36.7 C) 99 F (37.2 C) 98.7 F (37.1 C) 100.2 F (37.9 C)  TempSrc: Oral Axillary Oral Axillary  SpO2: 99% 93% 98% 98%  Weight:      Height:       CBG (last 3)  Recent Labs    02/21/24 0814 02/21/24 1342 02/21/24 1518  GLUCAP 129* 119* 101*    IV Fluid Intake:    ceFEPime  (MAXIPIME ) IV 2 g (02/21/24 0856)    MEDICATIONS   vitamin B-12  1,000 mcg Oral Daily   docusate sodium   100 mg Oral BID   insulin  aspart  0-15 Units Subcutaneous TID WC   lisinopril   20 mg Oral QPM   loratadine   10 mg Oral Daily   neomycin -bacitracin -polymyxin   Topical BID   omega-3 acid ethyl esters  2 capsule Oral BID   rosuvastatin   40 mg Oral QPM   senna-docusate  1 tablet Oral BID   sodium chloride  flush  3 mL Intravenous Q12H   traZODone   50 mg Oral QHS   PRN:  acetaminophen , acetaminophen  **OR** [DISCONTINUED] acetaminophen , ALPRAZolam , butalbital -acetaminophen -caffeine , cyclobenzaprine , diphenhydrAMINE , HYDROcodone -acetaminophen , hydrocortisone , ketorolac , ondansetron  (ZOFRAN ) IV, polyethylene glycol, senna, sodium chloride  flush, sodium phosphate , traMADol   Diet:   Diet Order             Diet regular Room service appropriate? Yes; Fluid consistency: Thin  Diet effective now                  CLINICALLY SIGNIFICANT STUDIES Basic Metabolic Panel:  Recent Labs  Lab 02/20/24 0512 02/21/24 0424  NA 138  138  K 4.5 4.0  CL 106 104  CO2 22 22  GLUCOSE 111* 128*  BUN 18 21  CREATININE 0.89 1.02*  CALCIUM  8.8* 8.6*   Liver Function Tests:  Recent Labs  Lab 02/15/24 0706 02/21/24 1258  AST 14* 29  ALT 26 24  ALKPHOS 39 50  BILITOT 0.7 0.4  PROT 5.5* 5.8*  ALBUMIN  2.9* 2.6*   CBC:  Recent Labs  Lab 02/20/24 0910 02/21/24 0424  WBC 12.9* 15.7*  HGB 10.6* 11.7*  HCT 30.9* 36.1  MCV 91.4 96.8  PLT 270 315   Coagulation: No results for input(s): LABPROT, INR in the last 168 hours. Cardiac Enzymes: No results for input(s): CKTOTAL, CKMB, CKMBINDEX, TROPONINI in the last 168 hours. Urinalysis:  Recent Labs  Lab 02/19/24 0805  COLORURINE YELLOW  LABSPEC 1.020  PHURINE 5.0  GLUCOSEU 50*  HGBUR NEGATIVE  BILIRUBINUR NEGATIVE  KETONESUR NEGATIVE  PROTEINUR NEGATIVE  NITRITE NEGATIVE  LEUKOCYTESUR NEGATIVE   Lipid Panel    Component Value Date/Time   CHOL 181 08/03/2023 1127   TRIG 223 (H) 08/03/2023 1127   HDL 49 08/03/2023 1127   CHOLHDL 3.7 08/03/2023 1127   CHOLHDL 7.2 04/12/2014 0904   VLDL NOT CALC 04/12/2014  0904   LDLCALC 94 08/03/2023 1127   Lab Results  Component Value Date   HGBA1C 7.0 (H) 02/03/2024    Urine Drug Screen:  No results found for: LABOPIA, COCAINSCRNUR, LABBENZ, AMPHETMU, THCU, LABBARB  Alcohol  Level: No results for input(s): ETH in the last 168 hours.  MR LUMBAR SPINE W WO CONTRAST Result Date: 02/21/2024 CLINICAL DATA:  Spine surgery/procedure, postop, infection suspected. Status post recent L4-5 fusion and subsequent wound exploration with repair of a CSF leak. EXAM: MRI LUMBAR SPINE WITHOUT AND WITH CONTRAST TECHNIQUE: Multiplanar and multiecho pulse sequences of the lumbar spine were obtained without and with intravenous contrast. CONTRAST:  10mL GADAVIST  GADOBUTROL  1 MMOL/ML IV SOLN COMPARISON:  Lumbar spine MRI 02/15/2024 FINDINGS: Segmentation:  Standard. Alignment:  Unchanged grade 1 anterolisthesis of L4  on L5. Vertebrae: No fracture, suspicious marrow lesion, significant marrow edema, or evidence of discitis. L4-5 posterior and interbody fusion. Conus medullaris and cauda equina: Conus extends to the L1 level and is normal in signal. Distortion of the cauda equina again suggests the presence of subdural fluid collections extending from the operative level superiorly into the lower thoracic spine, however these overall appear improved/smaller than on the prior MRI. Low signal foci suggestive of gas within these collections on the prior MRI are no longer apparent. There is mild diffuse dural enhancement throughout the lumbar spine as well as enhancement along the surface of the distal thoracic spinal cord and along the cauda equina. Paraspinal and other soft tissues: Postoperative changes in the posterior lumbar soft tissues. A subcutaneous fluid collection containing gas within the midline along the incision has decreased in size and now measures 3.4 x 4.2 x 11.0 cm (AP x transverse x craniocaudal) with mild peripheral enhancement. A dorsal and right-sided epidural fluid collection in the operative bed at L4-5 measures 4.1 x 6.3 x 5.7 cm and is similar in size to the prior MRI with moderate peripheral enhancement and mass effect on the thecal sac. A small amount of gas is present in this collection. Disc levels: At L4-5, the above described fluid collection along with bulging uncovered disc and residual posterior element hypertrophy result in moderate spinal stenosis. Degenerative changes elsewhere are similar to the prior study. IMPRESSION: Postoperative changes from L4-5 fusion as detailed above. Persistent but improved subdural fluid extending from the operative level into the lower thoracic spine. Unchanged dorsal and right-sided epidural fluid collection in the operative bed and decreased size of a midline subcutaneous collection. Mild nonspecific diffuse dural enhancement as well as enhancement along the  surface of the distal spinal cord and conus. While these may reflect residual sterile collections/seromas, infection and abscesses are not excluded. A residual CSF leak can also not be excluded by imaging. Electronically Signed   By: Dasie Hamburg M.D.   On: 02/21/2024 12:48   DG Chest Port 1 View Result Date: 02/20/2024 CLINICAL DATA:  Fever. EXAM: PORTABLE CHEST 1 VIEW COMPARISON:  March 12, 2019. FINDINGS: Stable cardiomediastinal silhouette. Both lungs are clear. The visualized skeletal structures are unremarkable. IMPRESSION: No active disease. Electronically Signed   By: Lynwood Landy Raddle M.D.   On: 02/20/2024 09:20   MR BRAIN W WO CONTRAST Result Date: 02/19/2024 CLINICAL DATA:  Headache, increasing frequency or severity EXAM: MRI HEAD WITHOUT AND WITH CONTRAST TECHNIQUE: Multiplanar, multiecho pulse sequences of the brain and surrounding structures were obtained without and with intravenous contrast. CONTRAST:  9mL GADAVIST  GADOBUTROL  1 MMOL/ML IV SOLN COMPARISON:  CT head 02/14/2024 FINDINGS: Brain: Multiple  small foci of T1 hyperintensity in the suprasellar region and in the right foramen of Luschka and scattered along the posterior aspect of the cerebellum and along the paramidline supratentorial extra-axial spaces, correlating with finding seen on recent CT head. No evidence of brain edema, acute infarct, hydrocephalus, or abnormal enhancement. Vascular: Major arterial flow voids are maintained skull base. Skull and upper cervical spine: Normal marrow signal. Sinuses/Orbits: Clear sinuses.  No acute orbital findings. IMPRESSION: 1. Many small T1 hyperintensities in the infratentorial and supratentorial extra-axial spaces, correlating with findings on recent CT head and compatible with lipid material. 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 2. No evidence of complicating features. Specifically, no brain edema, acute infarct, hydrocephalus, or abnormal enhancement. Electronically Signed   By: Gilmore GORMAN Molt  M.D.   On: 02/19/2024 21:03     Exam:  Gen: Nurses actively try to get her into bed very slowly.  She appears very anxious She is alert awake oriented. Motor: nonfocal. No tremor noted.   Diane Watkins is a 73 y.o. female with hx of hypertension, hyperlipidemia, type 2 diabetes, GERD, recent transforaminal lumbar decompression and fusion at L4-5 on 02/08/2024.  Patient initially discharged on 02/10/2024 and returned back to the emergency department on 08/31 with headaches.  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  Patient had MRI lumbar spine that confirmed a CSF leak which is likely the etiology of her headache.  Patient went exploration of lumbar wound with closure of dural defect on 02/17/2024.  Aggressively hydrated yesterday and overnight.  Still in pain.   MRI results discussed with daughter-in-law.  Few areas of lipoid deposits otherwise unremarkable.  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  Overall she is improving As needed medications per primary for headaches. Antibiotic recommendations from ID for her bacteremia.  Discussed with patient's daughter-in-law who is a Engineer, civil (consulting).  At this point neurology will sign off.     To contact Stroke Continuity provider, please refer to [email or web address]. After hours, contact General Neurology

## 2024-02-21 NOTE — Progress Notes (Signed)
 Pt became febrile and tachycardic. Patient was sitting on commode during vitals. Pt treated with Tylenol  for fever. Will re-check vitals when patient is resting.

## 2024-02-21 NOTE — Progress Notes (Signed)
 Subjective: Procedure(s) (LRB): WOUND EXPLORATION (N/A) REMOVAL, HARDWARE (N/A) 4 Days Post-Op   The patient is seen sitting on the commode.  She reports a low-grade headache but no severe headache.  She continues complain of the right buttock.  This is similar to the pain she had prior to the surgery.  She also notes a tremor in the right hand.  This will come and go but does seem to be worse when she is upset or stressed.  She was able to have a bowel movement.  She denies any current abdominal pain.  Objective: Vital signs in last 24 hours: Temp:  [97.7 F (36.5 C)-102.7 F (39.3 C)] 98 F (36.7 C) (09/07 0814) Pulse Rate:  [89-113] 110 (09/07 0814) Resp:  [17-20] 18 (09/07 0814) BP: (105-158)/(55-86) 127/86 (09/07 0814) SpO2:  [92 %-99 %] 99 % (09/07 0814)  Intake/Output from previous day: 09/06 0701 - 09/07 0700 In: 2215 [P.O.:360; I.V.:1855] Out: 80 [Emesis/NG output:80]  Labs: Recent Labs    02/20/24 0910 02/21/24 0424  WBC 12.9* 15.7*  RBC 3.38* 3.73*  HCT 30.9* 36.1  PLT 270 315   Recent Labs    02/20/24 0512 02/21/24 0424  NA 138 138  K 4.5 4.0  CL 106 104  CO2 22 22  BUN 18 21  CREATININE 0.89 1.02*  GLUCOSE 111* 128*  CALCIUM  8.8* 8.6*   No results for input(s): LABPT, INR in the last 72 hours.  Results for orders placed or performed during the hospital encounter of 02/14/24  Culture, blood (Routine X 2) w Reflex to ID Panel     Status: None (Preliminary result)   Collection Time: 02/20/24  9:21 AM   Specimen: BLOOD RIGHT HAND  Result Value Ref Range Status   Specimen Description BLOOD RIGHT HAND  Final   Special Requests   Final    BOTTLES DRAWN AEROBIC ONLY Blood Culture adequate volume   Culture  Setup Time   Final    GRAM NEGATIVE RODS AEROBIC BOTTLE ONLY CRITICAL RESULT CALLED TO, READ BACK BY AND VERIFIED WITH: PHARMD J LEDFORD 02/21/2024 @ 0545 BY AB Performed at [facility], [address], [place], [place] [address]     Culture GRAM NEGATIVE RODS  Final   Report Status PENDING  Incomplete  Blood Culture ID Panel (Reflexed)     Status: Abnormal   Collection Time: 02/20/24  9:21 AM  Result Value Ref Range Status   Enterococcus faecalis NOT DETECTED NOT DETECTED Final   Enterococcus Faecium NOT DETECTED NOT DETECTED Final   Listeria monocytogenes NOT DETECTED NOT DETECTED Final   Staphylococcus species NOT DETECTED NOT DETECTED Final   Staphylococcus aureus (BCID) NOT DETECTED NOT DETECTED Final   Staphylococcus epidermidis NOT DETECTED NOT DETECTED Final   Staphylococcus lugdunensis NOT DETECTED NOT DETECTED Final   Streptococcus species NOT DETECTED NOT DETECTED Final   Streptococcus agalactiae NOT DETECTED NOT DETECTED Final   Streptococcus pneumoniae NOT DETECTED NOT DETECTED Final   Streptococcus pyogenes NOT DETECTED NOT DETECTED Final   A.calcoaceticus-baumannii NOT DETECTED NOT DETECTED Final   Bacteroides fragilis NOT DETECTED NOT DETECTED Final   Enterobacterales DETECTED (A) NOT DETECTED Final    Comment: Enterobacterales represent a large order of gram negative bacteria, not a single organism. CRITICAL RESULT CALLED TO, READ BACK BY AND VERIFIED WITH: PHARMD J LEDFORD 02/21/2024 @ 0545 BY AB    Enterobacter cloacae complex DETECTED (A) NOT DETECTED Final    Comment: CRITICAL RESULT CALLED TO, READ BACK  BY AND VERIFIED WITH: PHARMD J LEDFORD 02/21/2024 @ 0545 BY AB    Escherichia coli NOT DETECTED NOT DETECTED Final   Klebsiella aerogenes NOT DETECTED NOT DETECTED Final   Klebsiella oxytoca NOT DETECTED NOT DETECTED Final   Klebsiella pneumoniae NOT DETECTED NOT DETECTED Final   Proteus species NOT DETECTED NOT DETECTED Final   Salmonella species NOT DETECTED NOT DETECTED Final   Serratia marcescens NOT DETECTED NOT DETECTED Final   Haemophilus influenzae NOT DETECTED NOT DETECTED Final   Neisseria meningitidis NOT DETECTED NOT DETECTED Final   Pseudomonas aeruginosa NOT DETECTED NOT DETECTED  Final   Stenotrophomonas maltophilia NOT DETECTED NOT DETECTED Final   Candida albicans NOT DETECTED NOT DETECTED Final   Candida auris NOT DETECTED NOT DETECTED Final   Candida glabrata NOT DETECTED NOT DETECTED Final   Candida krusei NOT DETECTED NOT DETECTED Final   Candida parapsilosis NOT DETECTED NOT DETECTED Final   Candida tropicalis NOT DETECTED NOT DETECTED Final   Cryptococcus neoformans/gattii NOT DETECTED NOT DETECTED Final   CTX-M ESBL NOT DETECTED NOT DETECTED Final   Carbapenem resistance IMP NOT DETECTED NOT DETECTED Final   Carbapenem resistance KPC NOT DETECTED NOT DETECTED Final   Carbapenem resistance NDM NOT DETECTED NOT DETECTED Final   Carbapenem resist OXA 48 LIKE NOT DETECTED NOT DETECTED Final   Carbapenem resistance VIM NOT DETECTED NOT DETECTED Final    Comment: Performed at [facility], [address], [place], [place] [address]     Physical Exam: Well-appearing woman sitting up in bed she is alert and oriented.  She is conversive. Body mass index is 37.49 kg/m.  Incision C/D/I  Sensation     Right      Left  L3   Intact     Intact L4   Intact     Intact L5    Intact     Intact S1   Intact     Intact    Motor Exam     Right     Left Iliopsoas  5/5     5/5  Quad   5/5     5/5  Hamstring  5/5     5/5  Tibials Anterior 5/5     5/5  EHL   5/5     5/5  Gastrocs  5/5     5/5      Assessment/Plan: Diane Watkins has been able to sit up on the commode without any headaches.  Hopefully suggest that the CSF leak is improving.  For this, we can mobilize her with physical therapy.  She can have head of bed ad lib.  However if she does develop headaches, I have asked nursing staff to notify me.  The patient has not had bowel movement.  We will continue the current bowel regimen.  The biggest concern at this point is the positive blood cultures.  This shows Enterobacter.  This is more typical of a line infection and given that she has had some  phlebitis from her IV site, I suspect this is the source.  However concerning exclude the possibility of an infection from the surgical site.  She has been started on meropenem.  We will continue with antibiotics per the hospitalist and possibly infectious disease if they think it is appropriate.  From a spine standpoint point, I am going to order an MRI scan of the lumbar spine with contrast to make sure we do not see any evidence of a deep infection  in the space.  The plan was also requesting an MRI of the hip.  Why think this is less likely source of any problems, I do not disagree with the suggestion for the MRI as I think is beneficial to be thorough given her complex medical course.  The patient has noted a tremor in her right hand.  To me this seems more like an essential tremor, but certainly this is outside my of expertise.  There would be no spinal cause for this.  Perhaps neurology could weigh in and see if they have any thoughts.  From pain perspective, we will continue with her current pain regimen.  We also continue with her current bowel regimen.    Diane Rhein, MD, MS [facility] [phone]

## 2024-02-22 DIAGNOSIS — R291 Meningismus: Secondary | ICD-10-CM | POA: Diagnosis not present

## 2024-02-22 DIAGNOSIS — R7881 Bacteremia: Secondary | ICD-10-CM

## 2024-02-22 LAB — CBC
HCT: 27.9 % — ABNORMAL LOW (ref 36.0–46.0)
Hemoglobin: 9.3 g/dL — ABNORMAL LOW (ref 12.0–15.0)
MCH: 31.2 pg (ref 26.0–34.0)
MCHC: 33.3 g/dL (ref 30.0–36.0)
MCV: 93.6 fL (ref 80.0–100.0)
Platelets: 318 K/uL (ref 150–400)
RBC: 2.98 MIL/uL — ABNORMAL LOW (ref 3.87–5.11)
RDW: 13.5 % (ref 11.5–15.5)
WBC: 13.8 K/uL — ABNORMAL HIGH (ref 4.0–10.5)
nRBC: 0 % (ref 0.0–0.2)

## 2024-02-22 LAB — GLUCOSE, CAPILLARY
Glucose-Capillary: 152 mg/dL — ABNORMAL HIGH (ref 70–99)
Glucose-Capillary: 132 mg/dL — ABNORMAL HIGH (ref 70–99)
Glucose-Capillary: 115 mg/dL — ABNORMAL HIGH (ref 70–99)
Glucose-Capillary: 120 mg/dL — ABNORMAL HIGH (ref 70–99)

## 2024-02-22 LAB — COMPREHENSIVE METABOLIC PANEL WITH GFR
ALT: 28 U/L (ref 0–44)
AST: 36 U/L (ref 15–41)
Albumin: 2.1 g/dL — ABNORMAL LOW (ref 3.5–5.0)
Alkaline Phosphatase: 45 U/L (ref 38–126)
Anion gap: 10 (ref 5–15)
BUN: 22 mg/dL (ref 8–23)
CO2: 22 mmol/L (ref 22–32)
Calcium: 8 mg/dL — ABNORMAL LOW (ref 8.9–10.3)
Chloride: 106 mmol/L (ref 98–111)
Creatinine, Ser: 0.92 mg/dL (ref 0.44–1.00)
GFR, Estimated: >60 mL/min (ref >60)
Glucose, Bld: 153 mg/dL — ABNORMAL HIGH (ref 70–99)
Potassium: 3.6 mmol/L (ref 3.5–5.1)
Sodium: 138 mmol/L (ref 135–145)
Total Bilirubin: 0.5 mg/dL (ref 0.0–1.2)
Total Protein: 4.6 g/dL — ABNORMAL LOW (ref 6.5–8.1)

## 2024-02-22 MED ORDER — ENOXAPARIN SODIUM 40 MG/0.4ML IJ SOSY
40.0000 mg | PREFILLED_SYRINGE | INTRAMUSCULAR | Status: DC
  Administered 2024-02-22 – 2024-02-24 (×3): 40 mg via SUBCUTANEOUS
  Filled 2024-02-22 (×3): qty 0.4

## 2024-02-22 NOTE — Progress Notes (Signed)
 Inpatient Rehab Coordinator Note:  I met with patient, her spouse, and her DIL at bedside to discuss CIR recommendations and goals/expectations of CIR stay.  We reviewed 3 hrs/day of therapy, physician follow up, and average length of stay 2 weeks (dependent upon progress) with goals of supervision.  Family confirms she will have good support at discharge.  We reviewed ID still awaiting sensitivities.  We can continue IV abx on CIR.  We reviewed need for insurance auth and I will submit with HTA.    Reche Lowers, PT, DPT Admissions Coordinator [phone] 02/22/24  12:23 PM

## 2024-02-22 NOTE — Progress Notes (Addendum)
 [facility]  Date of Admission:  02/14/2024   Total days of inpatient antibiotics 4  Principal Problem:   Meningitis-like reaction Active Problems:   Essential hypertension, benign   Hyperlipidemia   GERD (gastroesophageal reflux disease)   Type 2 diabetes mellitus without complications (HCC)   S/P lumbar fusion   Headache   Meningitis like reaction          Assessment: 73 year old female with past medical is hypertension,  Type 2 diabetes, transforaminal lumbar decompression and fusion at L4-L5 on 02/08/2024 admitted with #Enterobacter cloacae bacteremia in the setting of CSF leak secondary to postop infection - Patient had presented with headaches and postop pain.  Ortho engaged and noted that MR brain was reviewed which showed fluid at surgical site musculoskeletal staff most likely related to CSF leak. - MRI lumbar spine with and without contrast showed postop changes of L4-L5 fusion, present but improved improved subdural fluid extending from the operative level to lower thoracic spine, unchanged dorsal right epidural fluid collection, diffuse nonspecific dural enhancement.  Differential includes seroma, infection and abscess. -Patient seen by orthopedics and noted, do not see any signs of surgical site infection.  Noted there is some degree of fluid but it appears to be typical postop fluid.   Recommendations: -Continue cefepime  -Reviewed MR hip showed no evidence septic arthritis osteomyelitis, noted partial tear of the right gluteus medius tendon. - Follow-up blood culture sensitivities - Anticipate prolonged course of antibiotics given postop infection with hardware in place  Microbiology:   Antibiotics: Keflex  9/5 - 9/6 Cefepime  9/7-present Cultures: Blood 9/6 1 out of 4 bottles Enterobacter cloacae Urine UA negative nitrites negative leukocytes Other   SUBJECTIVE: Seen in bed. Interval: WBC 101.1, WBC 13.8 K  Review of  Systems: Review of Systems  All other systems reviewed and are negative.    Scheduled Meds:  vitamin B-12  1,000 mcg Oral Daily   docusate sodium   100 mg Oral BID   enoxaparin  (LOVENOX ) injection  40 mg Subcutaneous Q24H   insulin  aspart  0-15 Units Subcutaneous TID WC   lisinopril   20 mg Oral QPM   loratadine   10 mg Oral Daily   neomycin -bacitracin -polymyxin   Topical BID   omega-3 acid ethyl esters  2 capsule Oral BID   rosuvastatin   40 mg Oral QPM   senna-docusate  1 tablet Oral BID   sodium chloride  flush  3 mL Intravenous Q12H   Continuous Infusions:  ceFEPime  (MAXIPIME ) IV 2 g (02/22/24 0949)   PRN Meds:.acetaminophen , acetaminophen  **OR** [DISCONTINUED] acetaminophen , ALPRAZolam , butalbital -acetaminophen -caffeine , cyclobenzaprine , diphenhydrAMINE , HYDROcodone -acetaminophen , hydrocortisone , ketorolac , ondansetron  (ZOFRAN ) IV, polyethylene glycol, senna, sodium chloride  flush, sodium phosphate , traMADol  Allergies  Allergen Reactions   Amoxil [Amoxicillin] Other (See Comments)    Bad headaches Has patient had a PCN reaction causing immediate rash, facial/tongue/throat swelling, SOB or lightheadedness with hypotension: No Has patient had a PCN reaction causing severe rash involving mucus membranes or skin necrosis: No Has patient had a PCN reaction that required hospitalization: No Has patient had a PCN reaction occurring within the last 10 years: No If all of the above answers are NO, then may proceed with Cephalosporin use.    Oxycodone -Acetaminophen  Itching    OBJECTIVE: Vitals:   02/21/24 2245 02/22/24 0036 02/22/24 0641 02/22/24 0851  BP:  110/74 (!) 162/81 133/76  Pulse:  (!) 110 93 92  Resp:  18 18 18   Temp: (!) 101.1 F (38.4 C) 98.8 F (37.1  C) 98.3 F (36.8 C) 98.5 F (36.9 C)  TempSrc: Rectal Oral Oral Oral  SpO2:  99% 97% 96%  Weight:      Height:       Body mass index is 37.49 kg/m.  Physical Exam Constitutional:      Appearance: Normal  appearance.  HENT:     Head: Normocephalic and atraumatic.     Right Ear: Tympanic membrane normal.     Left Ear: Tympanic membrane normal.     Nose: Nose normal.     Mouth/Throat:     Mouth: Mucous membranes are moist.  Eyes:     Extraocular Movements: Extraocular movements intact.     Conjunctiva/sclera: Conjunctivae normal.     Pupils: Pupils are equal, round, and reactive to light.  Cardiovascular:     Rate and Rhythm: Normal rate and regular rhythm.     Heart sounds: No murmur heard.    No friction rub. No gallop.  Pulmonary:     Effort: Pulmonary effort is normal.     Breath sounds: Normal breath sounds.  Abdominal:     General: Abdomen is flat.     Palpations: Abdomen is soft.  Neurological:     Mental Status: She is alert.  Psychiatric:        Mood and Affect: Mood normal.       Lab Results Lab Results  Component Value Date   WBC 13.8 (H) 02/22/2024   HGB 9.3 (L) 02/22/2024   HCT 27.9 (L) 02/22/2024   MCV 93.6 02/22/2024   PLT 318 02/22/2024    Lab Results  Component Value Date   CREATININE 0.92 02/22/2024   BUN 22 02/22/2024   NA 138 02/22/2024   K 3.6 02/22/2024   CL 106 02/22/2024   CO2 22 02/22/2024    Lab Results  Component Value Date   ALT 28 02/22/2024   AST 36 02/22/2024   ALKPHOS 45 02/22/2024   BILITOT 0.5 02/22/2024        Loney Stank, MD [facility] 02/22/2024, 3:54 PM Evaluation of this patient requires complex antimicrobial therapy evaluation and counseling + isolation needs for disease transmission risk assessment and mitigation

## 2024-02-22 NOTE — Progress Notes (Addendum)
 PROGRESS NOTE Diane Watkins  FMW:[id number] DOB: 1951-02-23 DOA: 02/14/2024 PCP: Cook, Jayce G, DO  Brief Narrative/Hospital Course: Diane Watkins is a 73 y.o. year old female with PMH of  HTN, HLD, DM2, GERD, and a recent transforaminal lumbar decompression and fusion at L4-5 on 02/08/2024 with discharge from the hospital on 02/10/2024. She reported a headache at the time of her discharge but clinical symptoms were felt to most likely represent a tension headache. She returned to the ER 8/31 with persistent unrelenting severe generalized headache.  She had stopped taking her prescribed narcotic pain medication due to itching.  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 Patient admitted for persistent headache, orthospine surgery consulted Patient completed MRI showing persistent leak 9/3 after second opinion by Dr. Joshua underwent exploration of the lumbar wound with closure of dural defect.  Subjective: Seen and examined Worked with PT OT.  Underwent bedside chair No headache no other abdominal pain Overnight had fever of one 101.1 AT 10 PM. 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 failed to improve with conservative rx >after second opinion by Dr. Joshua s/p exploration lumbar wound with closure of dural defect 9/3 Holding chemical prophylaxis for now per surgery team Dilaudid  was  discontinued due to concern for altered mental status,on toradol  tramadol  and Tylenol  as needed Given patient's fever and bacteremia underwent MRI lumbar spine and right hip with and without contrast 9/7-reported as below, follow-up orthospine-do not suspect postop infection:  No evidence of septic arthritis or osteomyelitis in the pelvis or right hip, question mild tendinosis and partial tearing of the common hamstring tendons bilaterally, postsurgical change in the lumbar spine-CT report for detail. Mri hi[> Wall thickening enhancement  throughout the rectosigmoid colon with surrounding inflammation and a small amount of free  pelvic fluid suspicious for colitis. 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 UA 9/5   neg LE/Nitrite, blood culture sent-positive with Enterobacter clocae-now on cefepime .  Temperature spike x 1 last night, continue wound care, ID input appreciated Finding of rectosigmoid colitis on MRI-discussed with ID this morning continue current antibiotics, ID continues to follow. Check stool studies but she had received bowl regimen for constipation  Acute metabolic encephalopathy Lipoid meningitis Right hand tremor B12 deficiency: Due to intermittent confusion neurology was consulted and MRI brain with and without obtained-showed T1 hyperintense in the infratentorial and supratentorial extra axial spaces correlating with findings on recent CT and compatible with lipid material, consider lipoid meningitis-monitor syndrome per neurology .  TSH 4.7, ammonia normal, b12 low Continue IV fluid hydration to ensure CSF pressure remains normal in light of headache still being positional.   Some right hand tremor- will monitor, cont b12  replacement.  Postoperative anemia- possible  ABLA Anemia of chronic illness: hemoglobin slowly drifting will monitor closely , transfuse if less than 7   Recent Labs  Lab 02/18/24 0228 02/19/24 0224 02/20/24 0910 02/21/24 0424 02/22/24 0227  HGB 12.0 10.6* 10.6* 11.7* 9.3*  HCT 34.5* 30.7* 30.9* 36.1 27.9*     DM2 with  uncontrolled hyperglycemia: Fairly ok, continue SSI Recent Labs  Lab 02/21/24 1342 02/21/24 1518 02/21/24 1953 02/21/24 2243 02/22/24 0854  GLUCAP 119* 101* 211* 160* 152*     HTN: Stable, Continue home lisinopril .   HLD: Continue home atorvastatin  and Lovaza .  Constipation: Having bowel movement - now continue stool softener   Class II Obesity w/ Body mass index is 37.49 kg/m.: Will benefit with PCP follow-up, weight loss,healthy lifestyle and outpatient sleep eval.  Mobility: PT Orders: Active PT Follow up Rec: Acute Inpatient Rehab (3hours/Day)02/21/2024 1700    DVT prophylaxis: enoxaparin  (LOVENOX ) injection 40 mg Start: 02/22/24 1245 SCD's Start: 02/17/24 2112 SCDs Start: 02/14/24 1503 discussed with Dr.  Mardi and ok to start lovenox  prophylaxis on 9/8  Code Status:   Code Status: Full Code Family Communication: plan of care discussed with patient/ family updated at bedside Patient status is: Remains hospitalized because of severity of illness Level of care: Med-Surg   Dispo: The patient is from: home            Anticipated disposition: TBD Objective: Vitals last 24 hrs: Vitals:   02/21/24 2245 02/22/24 0036 02/22/24 0641 02/22/24 0851  BP:  110/74 (!) 162/81 133/76  Pulse:  (!) 110 93 92  Resp:  18 18 18   Temp: (!) 101.1 F (38.4 C) 98.8 F (37.1 C) 98.3 F (36.8 C) 98.5 F (36.9 C)  TempSrc: Rectal Oral Oral Oral  SpO2:  99% 97% 96%  Weight:      Height:       Physical Examination: General exam: AAOx3, no complaints HEENT:Oral mucosa moist, Ear/Nose WNL grossly Respiratory system: B/l clear BS,no use of accessory  muscle Cardiovascular system: S1 & S2 +, No JVD. Gastrointestinal system: Abdomen soft, mildly tender lower abdomen,ND, BS+ Nervous System: Alert, awake, moving all extremities. Non focal Extremities: LE edema neg, distal extremities warm.  Skin:No icterus.  Surgical site with honeycomb dressing+ MSK: Normal muscle bulk,tone, power   Medications reviewed:  Scheduled Meds:  vitamin B-12  1,000 mcg Oral Daily   docusate sodium   100 mg Oral BID   enoxaparin  (LOVENOX ) injection  40 mg Subcutaneous Q24H   insulin  aspart  0-15 Units Subcutaneous TID WC   lisinopril   20 mg Oral QPM   loratadine   10 mg Oral Daily   neomycin -bacitracin -polymyxin   Topical BID   omega-3 acid ethyl esters  2 capsule Oral BID   rosuvastatin   40 mg Oral QPM   senna-docusate  1 tablet Oral BID   sodium chloride  flush  3 mL Intravenous Q12H   Continuous Infusions:  ceFEPime  (MAXIPIME ) IV 2 g (02/22/24 0949)    Diet: Diet Order             Diet regular Room service appropriate? Yes; Fluid consistency: Thin  Diet effective now                    Data Reviewed: I have personally reviewed following labs and imaging studies ( see epic result tab) CBC: Recent Labs  Lab 02/18/24 0228 02/19/24 0224 02/20/24 0910 02/21/24 0424 02/22/24 0227  WBC 14.2* 14.4* 12.9* 15.7* 13.8*  HGB 12.0 10.6* 10.6* 11.7* 9.3*  HCT 34.5* 30.7* 30.9* 36.1 27.9*  MCV 91.5 91.9 91.4 96.8 93.6  PLT 363 337 270 315 318   CMP: Recent Labs  Lab 02/18/24 0228 02/19/24 0224 02/20/24 0512 02/21/24 0424 02/22/24 0227  NA 138 140 138 138 138  K 4.6 4.4 4.5 4.0 3.6  CL 102 103 106 104 106  CO2 24 23 22 22 22   GLUCOSE 147* 146* 111* 128* 153*  BUN 14 22 18 21 22   CREATININE 0.97 0.89 0.89 1.02* 0.92  CALCIUM  9.0 8.7* 8.8* 8.6* 8.0*   GFR: Estimated Creatinine Clearance: 60.5 mL/min (by C-G formula based on SCr of 0.92 mg/dL). Recent Labs  Lab 02/21/24 1258 02/22/24 0227  AST 29 36  ALT 24 28  ALKPHOS 50 45   BILITOT 0.4 0.5  PROT 5.8* 4.6*  ALBUMIN  2.6* 2.1*    Recent Labs  Lab 02/21/24 1258  LIPASE 27    Recent Labs  Lab 02/19/24 0947  AMMONIA 32   Coagulation Profile: No results for input(s): INR, PROTIME in the last 168 hours. Unresulted Labs (From admission, onward)     Start     Ordered   02/29/24 0500  Creatinine, serum  (enoxaparin  (LOVENOX )    CrCl >/= 30 ml/min)  Weekly,   R     Comments: while on enoxaparin  therapy   Question:  Specimen collection method  Answer:  Lab=Lab collect   02/22/24 1151   02/23/24 0500  CBC  Daily,   R     Question:  Specimen collection method  Answer:  Lab=Lab collect   02/22/24 1129   02/22/24 0908  Gastrointestinal Panel by PCR , Stool  (Gastrointestinal Panel by PCR, Stool                                                                                                                                                     **Does Not include CLOSTRIDIUM DIFFICILE testing. **If CDIFF testing is needed, place order from the C Difficile Testing order set.**)  Once,   R        02/22/24 0907   02/22/24 0500  Comprehensive metabolic panel with GFR  Daily,   R     Question:  Specimen collection method  Answer:  Lab=Lab collect   02/21/24 1441   02/14/24 1207  CBC with Differential/Platelet  Once,   STAT        02/14/24 1206           Antimicrobials/Microbiology: Anti-infectives (From admission, onward)    Start     Dose/Rate Route Frequency Ordered Stop   02/21/24 0600  ceFEPIme  (MAXIPIME ) 2 g in sodium chloride  0.9 % 100 mL IVPB        2 g 200 mL/hr over 30 Minutes Intravenous Every 12 hours 02/21/24 0557     02/19/24 0845  cephALEXin  (  KEFLEX ) capsule 500 mg  Status:  Discontinued        500 mg Oral Every 6 hours 02/19/24 0759 02/21/24 0557   02/18/24 0000  ceFAZolin  (ANCEF ) IVPB 2g/100 mL premix        2 g 200 mL/hr over 30 Minutes Intravenous Every 6 hours 02/17/24 2112 02/18/24 0800   02/17/24 1015  ceFAZolin  (ANCEF ) IVPB 2g/100  mL premix        2 g 200 mL/hr over 30 Minutes Intravenous  Once 02/17/24 0919 02/17/24 1828         Component Value Date/Time   SDES BLOOD RIGHT ARM 02/20/2024 0922   SPECREQUEST  02/20/2024 9077    BOTTLES DRAWN AEROBIC ONLY Blood Culture results may not be optimal due to an inadequate volume of blood received in culture bottles   CULT  02/20/2024 0922    NO GROWTH 2 DAYS Performed at [facility], [address], [place], [place] [address]    REPTSTATUS PENDING 02/20/2024 9077  Procedures: Procedure(s) (LRB): WOUND EXPLORATION (N/A) REMOVAL, HARDWARE (N/A) Mennie LAMY, MD [facility] 02/22/2024, 11:52 AM

## 2024-02-22 NOTE — Progress Notes (Signed)
 Subjective: Procedure(s) (LRB): WOUND EXPLORATION (N/A) REMOVAL, HARDWARE (N/A) 5 Days Post-Op  The patient reports that she was able to get up and ambulate yesterday but only taking a few steps.  She did have some degree of a headache but it was not severe.  She continues complain primary on the right buttock.  Objective: Vital signs in last 24 hours: Temp:  [98.3 F (36.8 C)-101.1 F (38.4 C)] 98.3 F (36.8 C) (09/08 0641) Pulse Rate:  [93-118] 93 (09/08 0641) Resp:  [18-20] 18 (09/08 0641) BP: (110-162)/(49-92) 162/81 (09/08 0641) SpO2:  [93 %-99 %] 97 % (09/08 0641)  Intake/Output from previous day: 09/07 0701 - 09/08 0700 In: 560 [P.O.:360; IV Piggyback:200] Out: -   Labs: Recent Labs    02/21/24 0424 02/22/24 0227  WBC 15.7* 13.8*  RBC 3.73* 2.98*  HCT 36.1 27.9*  PLT 315 318   Recent Labs    02/21/24 0424 02/22/24 0227  NA 138 138  K 4.0 3.6  CL 104 106  CO2 22 22  BUN 21 22  CREATININE 1.02* 0.92  GLUCOSE 128* 153*  CALCIUM  8.6* 8.0*   No results for input(s): LABPT, INR in the last 72 hours.  Physical Exam: Well-appearing woman in no acute distress Body mass index is 37.49 kg/m.  Dressing C/D/I  Sensation     Right      Left  L3   Intact     Intact L4   Intact     Intact L5    Intact     Intact S1   Intact     Intact    Motor Exam     Right     Left Iliopsoas  5/5     5/5  Quad   5/5     5/5  Hamstring  5/5     5/5  Tibials Anterior 5/5     5/5  EHL   5/5     5/5  Gastrocs  5/5     5/5      MRI scan lumbar spine was reviewed.  Interpretation, this shows fluid collections posteriorly in the surgical field as well as in subcutaneous tissues typical of normal postoperative findings.  Some mild diffuse uptake with contrast but no evidence of any specific signs of infection.  Overall normal-appearing postoperative MRI  MRI scan lumbar spine to my review shows no infection.  Report from the radiologist still  pending  Assessment/Plan: [name] was making slow progress.  She is able to ambulate a few steps yesterday which is much improved from prior.  She has had a slight headache but not severe.  At this point she continue the head of bed ad lib. and continue mobilize with physical therapy.  The MRI scan of the lumbar spine reveals normal postoperative findings.  There is some degree of fluid but this appears to be typical postoperative fluid.  I do not see any signs of surgical site infection we will continue observe this closely.  The MRI scan of the hip to my review does not show any signs of infection at this location either.  With regards to the infection, I would continue to defer to both medicine and infectious disease and continue with IV antibiotics.  Her white cells trending downward but will continue observe this closely.  Continue await sensitivities  Neurology has signed off.  Appreciate their input  The patient did have a bowel movement yesterday.  We continued regular bowel regimen.  Will continue  the current pain regimen as well.  We will continue to follow the patient closely while here in the hospital.  Diane Rhein, MD, MS [facility] [phone]

## 2024-02-22 NOTE — Plan of Care (Signed)
  Problem: Education: Goal: Knowledge of General Education information will improve Description: Including pain rating scale, medication(s)/side effects and non-pharmacologic comfort measures Outcome: Progressing   Problem: Health Behavior/Discharge Planning: Goal: Ability to manage health-related needs will improve Outcome: Progressing   Problem: Clinical Measurements: Goal: Ability to maintain clinical measurements within normal limits will improve Outcome: Progressing Goal: Will remain free from infection Outcome: Progressing Goal: Diagnostic test results will improve Outcome: Progressing Goal: Respiratory complications will improve Outcome: Progressing Goal: Cardiovascular complication will be avoided Outcome: Progressing   Problem: Activity: Goal: Risk for activity intolerance will decrease Outcome: Progressing   Problem: Nutrition: Goal: Adequate nutrition will be maintained Outcome: Progressing   Problem: Coping: Goal: Level of anxiety will decrease Outcome: Progressing   Problem: Elimination: Goal: Will not experience complications related to bowel motility Outcome: Progressing Goal: Will not experience complications related to urinary retention Outcome: Progressing   Problem: Pain Managment: Goal: General experience of comfort will improve and/or be controlled Outcome: Progressing   Problem: Safety: Goal: Ability to remain free from injury will improve Outcome: Progressing   Problem: Skin Integrity: Goal: Risk for impaired skin integrity will decrease Outcome: Progressing   Problem: Education: Goal: Ability to describe self-care measures that may prevent or decrease complications (Diabetes Survival Skills Education) will improve Outcome: Progressing Goal: Individualized Educational Video(s) Outcome: Progressing   Problem: Coping: Goal: Ability to adjust to condition or change in health will improve Outcome: Progressing   Problem: Fluid  Volume: Goal: Ability to maintain a balanced intake and output will improve Outcome: Progressing   Problem: Health Behavior/Discharge Planning: Goal: Ability to identify and utilize available resources and services will improve Outcome: Progressing Goal: Ability to manage health-related needs will improve Outcome: Progressing   Problem: Metabolic: Goal: Ability to maintain appropriate glucose levels will improve Outcome: Progressing   Problem: Nutritional: Goal: Maintenance of adequate nutrition will improve Outcome: Progressing Goal: Progress toward achieving an optimal weight will improve Outcome: Progressing   Problem: Skin Integrity: Goal: Risk for impaired skin integrity will decrease Outcome: Progressing   Problem: Tissue Perfusion: Goal: Adequacy of tissue perfusion will improve Outcome: Progressing   Problem: Education: Goal: Ability to verbalize activity precautions or restrictions will improve Outcome: Progressing Goal: Knowledge of the prescribed therapeutic regimen will improve Outcome: Progressing Goal: Understanding of discharge needs will improve Outcome: Progressing   Problem: Activity: Goal: Ability to avoid complications of mobility impairment will improve Outcome: Progressing Goal: Ability to tolerate increased activity will improve Outcome: Progressing Goal: Will remain free from falls Outcome: Progressing   Problem: Bowel/Gastric: Goal: Gastrointestinal status for postoperative course will improve Outcome: Progressing   Problem: Clinical Measurements: Goal: Ability to maintain clinical measurements within normal limits will improve Outcome: Progressing Goal: Postoperative complications will be avoided or minimized Outcome: Progressing Goal: Diagnostic test results will improve Outcome: Progressing   Problem: Pain Management: Goal: Pain level will decrease Outcome: Progressing   Problem: Skin Integrity: Goal: Will show signs of wound  healing Outcome: Progressing   Problem: Health Behavior/Discharge Planning: Goal: Identification of resources available to assist in meeting health care needs will improve Outcome: Progressing   Problem: Bladder/Genitourinary: Goal: Urinary functional status for postoperative course will improve Outcome: Progressing   Problem: Clinical Measurements: Goal: Ability to avoid or minimize complications of infection will improve Outcome: Progressing   Problem: Skin Integrity: Goal: Skin integrity will improve Outcome: Progressing

## 2024-02-22 NOTE — Progress Notes (Signed)
 Inpatient Rehab Admissions Coordinator Note:   Per therapy recommendations patient was screened for CIR candidacy by Reche FORBES Lowers, PT. At this time, pt appears to be a potential candidate for CIR. I will place an order for rehab consult for full assessment, per our protocol.  Please contact me any with questions.SABRA Reche Lowers, PT, DPT [phone] 02/22/24 9:56 AM

## 2024-02-22 NOTE — Progress Notes (Signed)
 Physical Therapy Treatment Patient Details Name: Diane Watkins MRN: [id number] DOB: 12-17-1950 Today's Date: 02/22/2024   History of Present Illness Pt is a 73 y/o F presenting to ED 8/31 with persistent headache.  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 MRI showing persistent leak, underwent exploration of the lumbar wound with closure of dural defect on 9/3. PMH of HTN, HLD, DM2, GERD, and a recent transforaminal lumbar decompression and fusion at L4-5 on 02/08/2024    PT Comments  Pt resting in bed on arrival, pleasant and eager for OOB mobility. Pt continues to be limited by pain and some anxiousness in anticipation of pain, however pt receptive to cues for breathing techniques throughout mobility. Pt requiring cues to recall all back precautions and cues throughout functional mobility for adherence. Pt requiring mod A +2 to come to sitting EOB with cues for optimal hand placement and sequencing of log roll technique. Pt able to come to stand from EOB and BSC with light min A to boost and max cues for hand placement. Pt progressing gait this session with RW for support and up to min A to maintain balance and with chair follow provided for safety. Pt up in chair at end of session with all needs met.    If plan is discharge home, recommend the following: Two people to help with walking and/or transfers;Two people to help with bathing/dressing/bathroom;Assistance with cooking/housework;Direct supervision/assist for medications management;Direct supervision/assist for financial management;Assist for transportation;Help with stairs or ramp for entrance;Supervision due to cognitive status   Can travel by private vehicle        Equipment Recommendations  Rolling walker (2 wheels)    Recommendations for Other Services  Rehab consult     Precautions / Restrictions Precautions Precautions: Back;Fall Precaution Booklet Issued: No Recall of Precautions/Restrictions: Impaired Precaution/Restrictions Comments: verbally reviewed back prec Required Braces or Orthoses: Other Brace Other Brace: no brace needed per orders Restrictions Weight Bearing Restrictions Per Provider Order: No     Mobility  Bed Mobility Overal bed mobility: Needs Assistance Bed Mobility: Sidelying to Sit, Rolling, Sit to Sidelying Rolling: Mod assist Sidelying to sit: +2 for physical assistance, Min assist       General bed mobility comments: assist to manage LE and trunk    Transfers Overall transfer level: Needs assistance Equipment used: Rolling walker (2 wheels) Transfers: Sit to/from Stand, Bed to chair/wheelchair/BSC Sit to Stand: Min assist   Step pivot transfers: Min assist       General transfer comment: cues for hand placement, slow to rise. light assist to steady on rise and during step pivot    Ambulation/Gait Ambulation/Gait assistance: Contact guard assist, +2 safety/equipment, Min assist Gait Distance (Feet): 200 Feet Assistive device: Rolling walker (2 wheels) Gait Pattern/deviations: Step-through pattern, Decreased stride length Gait velocity: decreased     General Gait Details: up to min A to maintain balance, chair follow for safety, cues for closer RW proximity and upright posture   Stairs             Wheelchair Mobility     Tilt Bed    Modified Rankin (Stroke Patients Only)       Balance Overall balance assessment: Needs assistance Sitting-balance support: Feet supported Sitting balance-Leahy Scale: Good     Standing balance support: Bilateral upper extremity supported Standing balance-Leahy Scale: Poor Standing balance  comment: dependent on BUE support                            Communication Communication Communication: No apparent difficulties   Cognition Arousal: Alert Behavior During Therapy: WFL for tasks assessed/performed   PT - Cognitive impairments: Awareness, Safety/Judgement                         Following commands: Intact      Cueing Cueing Techniques: Verbal cues  Exercises Other Exercises Other Exercises: reviewed IS use and frequency, pt unable to demo back as MD and RN in room and pt becoming distracted    General Comments General comments (skin integrity, edema, etc.): daughter in law present throughtout session      Pertinent Vitals/Pain Pain Assessment Pain Assessment: Faces Faces Pain Scale: Hurts even more Pain Location: R hip in sitting Pain Descriptors / Indicators: Aching Pain Intervention(s): Monitored during session, Limited activity within patient's tolerance, Repositioned    Home Living                          Prior Function            PT Goals (current goals can now be found in the care plan section) Acute Rehab PT Goals Patient Stated Goal: to reduce pain, get outof bed PT Goal Formulation: With patient Time For Goal Achievement: 03/06/24 Progress towards PT goals: Progressing toward goals    Frequency    Min 5X/week      PT Plan      Co-evaluation              AM-PAC PT 6 Clicks Mobility   Outcome Measure  Help needed turning from your back to your side while in a flat bed without using bedrails?: A Lot Help needed moving from lying on your back to sitting on the side of a flat bed without using bedrails?: A Lot Help needed moving to and from a bed to a chair (including a wheelchair)?: A Lot Help needed standing up from a chair using your arms (e.g., wheelchair or bedside chair)?: A Little Help needed to walk in hospital room?: Total (chair follow +2) Help needed climbing 3-5 steps with a railing? : Total 6 Click Score: 11    End of Session   Activity Tolerance: Patient tolerated treatment well Patient left: with call bell/phone  within reach;with family/visitor present;in chair;with nursing/sitter in room (with MD present) Nurse Communication: Mobility status PT Visit Diagnosis: Pain;Difficulty in walking, not elsewhere classified (R26.2) Pain - part of body:  (headache)     Time: 9041-8960 PT Time Calculation (min) (ACUTE ONLY): 41 min  Charges:    $Gait Training: 23-37 mins $Therapeutic Activity: 8-22 mins PT General Charges $$ ACUTE PT VISIT: 1 Visit                     Therisa R. PTA Acute Rehabilitation Services Office: [phone]   Therisa CHRISTELLA Boor 02/22/2024, 12:39 PM

## 2024-02-23 ENCOUNTER — Inpatient Hospital Stay (HOSPITAL_COMMUNITY)

## 2024-02-23 ENCOUNTER — Other Ambulatory Visit: Payer: Self-pay

## 2024-02-23 DIAGNOSIS — D72829 Elevated white blood cell count, unspecified: Secondary | ICD-10-CM

## 2024-02-23 DIAGNOSIS — R291 Meningismus: Secondary | ICD-10-CM | POA: Diagnosis not present

## 2024-02-23 LAB — GASTROINTESTINAL PANEL BY PCR, STOOL (REPLACES STOOL CULTURE)

## 2024-02-23 LAB — CBC
HCT: 30.2 % — ABNORMAL LOW (ref 36.0–46.0)
Hemoglobin: 9.9 g/dL — ABNORMAL LOW (ref 12.0–15.0)
MCH: 30.9 pg (ref 26.0–34.0)
MCHC: 32.8 g/dL (ref 30.0–36.0)
MCV: 94.4 fL (ref 80.0–100.0)
Platelets: 366 K/uL (ref 150–400)
RBC: 3.2 MIL/uL — ABNORMAL LOW (ref 3.87–5.11)
RDW: 13.6 % (ref 11.5–15.5)
WBC: 11.4 K/uL — ABNORMAL HIGH (ref 4.0–10.5)
nRBC: 0 % (ref 0.0–0.2)

## 2024-02-23 LAB — COMPREHENSIVE METABOLIC PANEL WITH GFR
ALT: 47 U/L — ABNORMAL HIGH (ref 0–44)
AST: 64 U/L — ABNORMAL HIGH (ref 15–41)
Albumin: 2.1 g/dL — ABNORMAL LOW (ref 3.5–5.0)
Alkaline Phosphatase: 45 U/L (ref 38–126)
Anion gap: 15 (ref 5–15)
BUN: 16 mg/dL (ref 8–23)
CO2: 21 mmol/L — ABNORMAL LOW (ref 22–32)
Calcium: 8.6 mg/dL — ABNORMAL LOW (ref 8.9–10.3)
Chloride: 106 mmol/L (ref 98–111)
Creatinine, Ser: 0.82 mg/dL (ref 0.44–1.00)
GFR, Estimated: >60 mL/min (ref >60)
Glucose, Bld: 95 mg/dL (ref 70–99)
Potassium: 4.4 mmol/L (ref 3.5–5.1)
Sodium: 142 mmol/L (ref 135–145)
Total Bilirubin: 0.5 mg/dL (ref 0.0–1.2)
Total Protein: 4.9 g/dL — ABNORMAL LOW (ref 6.5–8.1)

## 2024-02-23 LAB — CULTURE, BLOOD (ROUTINE X 2): Special Requests: ADEQUATE

## 2024-02-23 LAB — GLUCOSE, CAPILLARY
Glucose-Capillary: 121 mg/dL — ABNORMAL HIGH (ref 70–99)
Glucose-Capillary: 89 mg/dL (ref 70–99)
Glucose-Capillary: 110 mg/dL — ABNORMAL HIGH (ref 70–99)

## 2024-02-23 MED ORDER — SODIUM CHLORIDE 0.9% FLUSH: 10.0000 mL | INTRAVENOUS | Status: DC | PRN

## 2024-02-23 MED ORDER — IOHEXOL 9 MG/ML PO SOLN
500.0000 mL | ORAL | Status: AC
  Administered 2024-02-23 (×2): 500 mL via ORAL

## 2024-02-23 MED ORDER — CHLORHEXIDINE GLUCONATE CLOTH 2 % EX PADS
6.0000 | MEDICATED_PAD | Freq: Every day | CUTANEOUS | Status: DC
  Administered 2024-02-23 – 2024-02-24 (×2): 6 via TOPICAL

## 2024-02-23 MED ORDER — SODIUM CHLORIDE 0.9 % IV SOLN
2.0000 g | Freq: Three times a day (TID) | INTRAVENOUS | Status: DC
  Administered 2024-02-23 – 2024-02-24 (×3): 2 g via INTRAVENOUS
  Filled 2024-02-23 (×3): qty 12.5

## 2024-02-23 NOTE — Progress Notes (Signed)
 [facility]  Date of Admission:  02/14/2024   Total days of inpatient antibiotics 5  Principal Problem:   Meningitis-like reaction Active Problems:   Essential hypertension, benign   Hyperlipidemia   GERD (gastroesophageal reflux disease)   Type 2 diabetes mellitus without complications (HCC)   S/P lumbar fusion   Headache   Meningitis like reaction          Assessment: 73 year old female with past medical is hypertension,  Type 2 diabetes, transforaminal lumbar decompression and fusion at L4-L5 on 02/08/2024 admitted with #Enterobacter cloacae bacteremia in the setting of CSF leak secondary to postop infection #Fever, resolved #leukocytosis trending down - Patient had presented with headaches and postop pain.  Ortho engaged and noted that MR brain was reviewed which showed fluid at surgical site musculoskeletal staff most likely related to CSF leak. - MRI lumbar spine with and without contrast showed postop changes of L4-L5 fusion, present but improved improved subdural fluid extending from the operative level to lower thoracic spine, unchanged dorsal right epidural fluid collection, diffuse nonspecific dural enhancement.  Differential includes seroma, infection and abscess. -Patient seen by orthopedics and noted, do not see any signs of surgical site infection.  Noted there is some degree of fluid but it appears to be typical postop fluid.   Recommendations: -Continue cefepime  -Reviewed MR hip showed no evidence septic arthritis osteomyelitis, noted partial tear of the right gluteus medius tendon. - Place PICC.  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  Of note she had some diarrhea following enema on 9/6 - GIP negative on 9/8 - DC enteric precautions  Evaluation of this patient requires complex antimicrobial therapy evaluation and counseling + isolation needs for disease transmission risk assessment and mitigation    OPAT ORDERS:  Diagnosis: Postop vertebral infection with hardware in place  Culture Result: Enterobacter cloacae  Allergies  Allergen Reactions   Amoxil [Amoxicillin] Other (See Comments)    Bad headaches Has patient had a PCN reaction causing immediate rash, facial/tongue/throat swelling, SOB or lightheadedness with hypotension: No Has patient had a PCN reaction causing severe rash involving mucus membranes or skin necrosis: No Has patient had a PCN reaction that required hospitalization: No Has patient had a PCN reaction occurring within the last 10 years: No If all of the above answers are NO, then may proceed with Cephalosporin use.    Oxycodone -Acetaminophen  Itching     Discharge antibiotics to be given via PICC line:  Per pharmacy protocol cefepime  2 g every 12 hours Aim for Vancomycin  trough 15-20 or AUC 400-550 (unless otherwise indicated)   Duration: 8 weeks End Date: 11/1  [facility] Care Per Protocol with Biopatch Use: Home health RN for IV administration and teaching, line care and labs.    Labs weekly while on IV antibiotics: x__ CBC with differential __ BMP **TWICE WEEKLY ON VANCOMYCIN   _x_ CMP __x CRP __x ESR __ Vancomycin  trough TWICE WEEKLY __ CK  __x Please pull PIC at completion of IV antibiotics __ Please leave [facility]  in place until doctor has seen patient or been notified  Fax weekly labs to [phone]  Clinic Follow Up Appt: 10/1  @ [facility] with Dennise  Microbiology:   Antibiotics: Keflex  9/5 - 9/6 Cefepime   9/7-present Cultures: Blood 9/6 1 out of 4 bottles Enterobacter cloacae Urine UA negative nitrites negative leukocytes Other   SUBJECTIVE: Seen in bed. Interval: Afebrile overnight.  WBC 11.4 K.  Review of Systems: Review of Systems  All other systems reviewed and are negative.    Scheduled Meds:  vitamin B-12  1,000 mcg Oral Daily   docusate sodium   100 mg Oral BID   enoxaparin  (LOVENOX ) injection  40 mg Subcutaneous Q24H   insulin  aspart  0-15 Units Subcutaneous TID WC   lisinopril   20 mg Oral QPM   loratadine   10 mg Oral Daily   neomycin -bacitracin -polymyxin   Topical BID   omega-3 acid ethyl esters  2 capsule Oral BID   rosuvastatin   40 mg Oral QPM   senna-docusate  1 tablet Oral BID   sodium chloride  flush  3 mL Intravenous Q12H   Continuous Infusions:  ceFEPime  (MAXIPIME ) IV     PRN Meds:.acetaminophen , acetaminophen  **OR** [DISCONTINUED] acetaminophen , ALPRAZolam , butalbital -acetaminophen -caffeine , cyclobenzaprine , diphenhydrAMINE , HYDROcodone -acetaminophen , hydrocortisone , ketorolac , ondansetron  (ZOFRAN ) IV, polyethylene glycol, senna, sodium chloride  flush, sodium phosphate , traMADol  Allergies  Allergen Reactions   Amoxil [Amoxicillin] Other (See Comments)    Bad headaches Has patient had a PCN reaction causing immediate rash, facial/tongue/throat swelling, SOB or lightheadedness with hypotension: No Has patient had a PCN reaction causing severe rash involving mucus membranes or skin necrosis: No Has patient had a PCN reaction that required hospitalization: No Has patient had a PCN reaction occurring within the last 10 years: No If all of the above answers are NO, then may proceed with Cephalosporin use.    Oxycodone -Acetaminophen  Itching    OBJECTIVE: Vitals:   02/22/24 1802 02/22/24 2105 02/23/24 0500 02/23/24 0840  BP: (!) 160/86 (!) 125/59 135/84 124/68  Pulse: 94 75 70 79  Resp: 17 16 16 17   Temp: 98.4 F (36.9 C) 98 F (36.7 C) 98.4 F (36.9  C) 98.5 F (36.9 C)  TempSrc: Oral  Oral Oral  SpO2: 97% 95% 95% 97%  Weight:      Height:       Body mass index is 37.49 kg/m.  Physical Exam Constitutional:      Appearance: Normal appearance.  HENT:     Head: Normocephalic and atraumatic.     Right Ear: Tympanic membrane normal.     Left Ear: Tympanic membrane normal.     Nose: Nose normal.     Mouth/Throat:     Mouth: Mucous membranes are moist.  Eyes:     Extraocular Movements: Extraocular movements intact.     Conjunctiva/sclera: Conjunctivae normal.     Pupils: Pupils are equal, round, and reactive to light.  Cardiovascular:     Rate and Rhythm: Normal rate and regular rhythm.     Heart sounds: No murmur heard.    No friction rub. No gallop.  Pulmonary:     Effort: Pulmonary effort is normal.     Breath sounds: Normal breath sounds.  Abdominal:     General: Abdomen is flat.     Palpations: Abdomen is soft.  Neurological:     Mental Status: She is alert.  Psychiatric:        Mood and Affect: Mood normal.       Lab Results Lab Results  Component Value  Date   WBC 11.4 (H) 02/23/2024   HGB 9.9 (L) 02/23/2024   HCT 30.2 (L) 02/23/2024   MCV 94.4 02/23/2024   PLT 366 02/23/2024    Lab Results  Component Value Date   CREATININE 0.82 02/23/2024   BUN 16 02/23/2024   NA 142 02/23/2024   K 4.4 02/23/2024   CL 106 02/23/2024   CO2 21 (L) 02/23/2024    Lab Results  Component Value Date   ALT 47 (H) 02/23/2024   AST 64 (H) 02/23/2024   ALKPHOS 45 02/23/2024   BILITOT 0.5 02/23/2024        Loney Stank, MD [facility] 02/23/2024, 2:02 PM Evaluation of this patient requires complex antimicrobial therapy evaluation and counseling + isolation needs for disease transmission risk assessment and mitigation

## 2024-02-23 NOTE — Progress Notes (Signed)
 PHARMACY NOTE:  ANTIMICROBIAL RENAL DOSAGE ADJUSTMENT  Current antimicrobial regimen includes a mismatch between antimicrobial dosage and estimated renal function.  As per policy approved by the Pharmacy & Therapeutics and Medical Executive Committees, the antimicrobial dosage will be adjusted accordingly.  Current antimicrobial dosage:  Cefepime  2 gm IV Q 12 hours   Indication: E cloacae bacteremia  Renal Function:  Estimated Creatinine Clearance: 67.8 mL/min (by C-G formula based on SCr of 0.82 mg/dL). []      On intermittent HD, scheduled: []      On CRRT    Antimicrobial dosage has been changed to:  Cefepime  2 gm IV Q 8 hours   Additional comments:   Thank you for allowing pharmacy to be a part of this patient's care.  Damien Quiet, PharmD, BCPS, BCIDP Infectious Diseases Clinical Pharmacist Phone: [phone] 02/23/2024 1:03 PM

## 2024-02-23 NOTE — Progress Notes (Signed)
 PHARMACY CONSULT NOTE FOR:  OUTPATIENT  PARENTERAL ANTIBIOTIC THERAPY (OPAT)  Indication: Enterobacter cloacae bacteremia/lumbar hardware infection Regimen: Cefepime  2 gm IV Q 8 hours  End date: 04/14/24  IV antibiotic discharge orders are pended. To discharging provider:  please sign these orders via discharge navigator,  Select New Orders & click on the button choice - Manage This Unsigned Work.     Thank you for allowing pharmacy to be a part of this patient's care.  Damien Quiet, PharmD, BCPS, BCIDP Infectious Diseases Clinical Pharmacist Phone: [phone] 02/23/2024, 1:01 PM

## 2024-02-23 NOTE — Progress Notes (Signed)
 PROGRESS NOTE Diane Watkins  FMW:[id number] DOB: [birth year] DOA: 02/14/2024 PCP: Cook, Jayce G, DO  Brief Narrative/Hospital Course: Diane Watkins is a 73 y.o. year old female with PMH of  HTN, HLD, DM2, GERD, and a recent transforaminal lumbar decompression and fusion at L4-5 on 02/08/2024 with discharge from the hospital on 02/10/2024. She reported a headache at the time of her discharge but clinical symptoms were felt to most likely represent a tension headache. She returned to the ER 8/31 with persistent unrelenting severe generalized headache.  She had stopped taking her prescribed narcotic pain medication due to itching.  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 Patient admitted for persistent headache, orthospine surgery consulted Patient completed MRI showing persistent leak 9/3 after second opinion by Dr. Joshua underwent exploration of the lumbar wound with closure of dural defect.  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 failed to improve with conservative rx >after second opinion by Dr. Joshua s/p exploration lumbar wound with closure of dural defect 9/3 Holding chemical prophylaxis for now per surgery team Dilaudid  was discontinued due to concern for altered mental status,on toradol  tramadol  and Tylenol  as  needed Given patient's fever and bacteremia underwent MRI lumbar spine and right hip with and without contrast 9/7-reported as below, follow-up orthospine-do not suspect postop infection: No evidence of septic arthritis or osteomyelitis in the pelvis or right hip, question mild tendinosis and partial tearing of the common hamstring tendons bilaterally, postsurgical change in the lumbar spine-CT report for detail. Mri hi[> Wall thickening enhancement  throughout the rectosigmoid colon with surrounding inflammation and a small amount of free  pelvic fluid suspicious for colitis.  Previously she had constipation for several days before needing laxatives  Continue PT OT as per orthopedics. Continue multimodal pain management  Enterobacter cloacae bacteremia 9/6 Fever 9/5 night Phlebitis of the left hand Rectosigmoid colitis per MRI Patient having fever since 9/5 night-procalcitonin neg, cxr 9/6- stable. UA 9/5   neg LE/Nitrite, blood culture sent-positive with Enterobacter clocae pansensitive- On cefepime - ID maanging. No more fever overnight. Will likely need prolonged course of antibiotics given postop infection with hardware in place Finding of rectosigmoid colitis on MRI-discussed with ID- cont same abx- CT scan ordered this morning will follow-up   Acute metabolic encephalopathy-resolved. 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 Some right hand tremor previously-cont to monitornt.  Postoperative anemia- possible ABLA Anemia of chronic illness: hb holding 9 g range,monitor and transfuse if < 7. Recent Labs  Lab 02/19/24 0224 02/20/24 0910 02/21/24 0424 02/22/24 0227 02/23/24 0218  HGB 10.6* 10.6* 11.7* 9.3* 9.9*  HCT 30.7* 30.9* 36.1 27.9* 30.2*     DM2 with  uncontrolled  hyperglycemia: Controlled on SSI Recent Labs  Lab 02/22/24  9145 02/22/24 1211 02/22/24 1801 02/22/24 2104 02/23/24 0838  GLUCAP 152* 132* 115* 120* 121*     HTN: Stable,on lisinopril .   HLD: Continue home atorvastatin  and Lovaza .  Constipation: Having bowel movement - now continue stool softener   Class II Obesity w/ Body mass index is 37.49 kg/m.: Will benefit with PCP follow-up, weight loss,healthy lifestyle and outpatient sleep eval.  Mobility: PT Orders: Active PT Follow up Rec: Acute Inpatient Rehab (3hours/Day)02/22/2024 1200    DVT prophylaxis: enoxaparin  (LOVENOX ) injection 40 mg Start: 02/22/24 1245 SCD's Start: 02/17/24 2112 SCDs Start: 02/14/24 1503 discussed with Dr.  Mardi and ok to start lovenox  prophylaxis on 9/8  Code Status:   Code Status: Full Code Family Communication: plan of care discussed with patient/ no family today at bedside  Patient status is: Remains hospitalized because of severity of illness Level of care: Med-Surg   Dispo: The patient is from: home            Anticipated disposition: CIR being planned  Objective: Vitals last 24 hrs: Vitals:   02/22/24 1802 02/22/24 2105 02/23/24 0500 02/23/24 0840  BP: (!) 160/86 (!) 125/59 135/84 124/68  Pulse: 94 75 70 79  Resp: 17 16 16 17   Temp: 98.4 F (36.9 C) 98 F (36.7 C) 98.4 F (36.9 C) 98.5 F (36.9 C)  TempSrc: Oral  Oral Oral  SpO2: 97% 95% 95% 97%  Weight:      Height:       Physical Examination: General exam: Alert awake and pleasant  HEENT:Oral mucosa moist, Ear/Nose WNL grossly Respiratory system: B/l clear BS,no use of accessory muscle Cardiovascular system: S1 & S2 +, No JVD. Gastrointestinal system: Abdomen soft, mildly tender lower abdomen,ND, BS+ Nervous System: Alert, awake, moving all extremities. Non focal Extremities: LE edema trace, distal extremities warm.  Skin:No icterus. Surgical site with honeycomb dressing+ MSK: Normal muscle bulk,tone, power    Medications reviewed:  Scheduled Meds:  vitamin B-12  1,000 mcg Oral Daily   docusate sodium   100 mg Oral BID   enoxaparin  (LOVENOX ) injection  40 mg Subcutaneous Q24H   insulin  aspart  0-15 Units Subcutaneous TID WC   lisinopril   20 mg Oral QPM   loratadine   10 mg Oral Daily   neomycin -bacitracin -polymyxin   Topical BID   omega-3 acid ethyl esters  2 capsule Oral BID   rosuvastatin   40 mg Oral QPM   senna-docusate  1 tablet Oral BID   sodium chloride  flush  3 mL Intravenous Q12H   Continuous Infusions:  ceFEPime  (MAXIPIME ) IV 2 g (02/23/24 0902)    Diet: Diet Order             Diet regular Room service appropriate? Yes; Fluid consistency: Thin  Diet effective now                    Data Reviewed: I have personally reviewed following labs and imaging studies ( see epic result tab) CBC: Recent Labs  Lab 02/19/24 0224 02/20/24 0910 02/21/24 0424 02/22/24 0227 02/23/24 0218  WBC 14.4* 12.9* 15.7* 13.8* 11.4*  HGB 10.6* 10.6* 11.7* 9.3* 9.9*  HCT 30.7* 30.9* 36.1 27.9* 30.2*  MCV 91.9 91.4 96.8 93.6 94.4  PLT 337 270 315 318 366   CMP: Recent Labs  Lab 02/19/24 0224 02/20/24 0512 02/21/24 0424 02/22/24 0227 02/23/24 0218  NA 140 138 138 138 142  K 4.4 4.5 4.0 3.6 4.4  CL 103 106 104 106 106  CO2 23 22  22 22 21*  GLUCOSE 146* 111* 128* 153* 95  BUN 22 18 21 22 16   CREATININE 0.89 0.89 1.02* 0.92 0.82  CALCIUM  8.7* 8.8* 8.6* 8.0* 8.6*   GFR: Estimated Creatinine Clearance: 67.8 mL/min (by C-G formula based on SCr of 0.82 mg/dL). Recent Labs  Lab 02/21/24 1258 02/22/24 0227 02/23/24 0218  AST 29 36 64*  ALT 24 28 47*  ALKPHOS 50 45 45  BILITOT 0.4 0.5 0.5  PROT 5.8* 4.6* 4.9*  ALBUMIN  2.6* 2.1* 2.1*    Recent Labs  Lab 02/21/24 1258  LIPASE 27    Recent Labs  Lab 02/19/24 0947  AMMONIA 32   Coagulation Profile: No results for input(s): INR, PROTIME in the last 168 hours. Unresulted Labs (From admission, onward)     Start      Ordered   02/29/24 0500  Creatinine, serum  (enoxaparin  (LOVENOX )    CrCl >/= 30 ml/min)  Weekly,   R     Comments: while on enoxaparin  therapy   Question:  Specimen collection method  Answer:  Lab=Lab collect   02/22/24 1151   02/23/24 0500  CBC  Daily,   R     Question:  Specimen collection method  Answer:  Lab=Lab collect   02/22/24 1129   02/22/24 0500  Comprehensive metabolic panel with GFR  Daily,   R     Question:  Specimen collection method  Answer:  Lab=Lab collect   02/21/24 1441   02/14/24 1207  CBC with Differential/Platelet  Once,   STAT        02/14/24 1206           Antimicrobials/Microbiology: Anti-infectives (From admission, onward)    Start     Dose/Rate Route Frequency Ordered Stop   02/21/24 0600  ceFEPIme  (MAXIPIME ) 2 g in sodium chloride  0.9 % 100 mL IVPB        2 g 200 mL/hr over 30 Minutes Intravenous Every 12 hours 02/21/24 0557     02/19/24 0845  cephALEXin  (KEFLEX ) capsule 500 mg  Status:  Discontinued        500 mg Oral Every 6 hours 02/19/24 0759 02/21/24 0557   02/18/24 0000  ceFAZolin  (ANCEF ) IVPB 2g/100 mL premix        2 g 200 mL/hr over 30 Minutes Intravenous Every 6 hours 02/17/24 2112 02/18/24 0800   02/17/24 1015  ceFAZolin  (ANCEF ) IVPB 2g/100 mL premix        2 g 200 mL/hr over 30 Minutes Intravenous  Once 02/17/24 0919 02/17/24 1828         Component Value Date/Time   SDES BLOOD RIGHT ARM 02/20/2024 0922   SPECREQUEST  02/20/2024 9077    BOTTLES DRAWN AEROBIC ONLY Blood Culture results may not be optimal due to an inadequate volume of blood received in culture bottles   CULT  02/20/2024 0922    NO GROWTH 3 DAYS Performed at [facility], [address], [place], [place] [address]    REPTSTATUS PENDING 02/20/2024 9077  Procedures: Procedure(s) (LRB): WOUND EXPLORATION (N/A) REMOVAL, HARDWARE (N/A) Mennie LAMY, MD [facility] 02/23/2024, 11:44 AM

## 2024-02-23 NOTE — Progress Notes (Signed)
 Inpatient Rehab Admissions Coordinator:   Insurance auth pending with [facility].  Note ordered CT abd today, which is pending.  Will follow.   Reche Lowers, PT, DPT Admissions Coordinator [phone] 02/23/24  11:10 AM

## 2024-02-23 NOTE — Progress Notes (Signed)
 Peripherally Inserted Central Catheter Placement  The IV Nurse has discussed with the patient and/or persons authorized to consent for the patient, the purpose of this procedure and the potential benefits and risks involved with this procedure.  The benefits include less needle sticks, lab draws from the catheter, and the patient may be discharged home with the catheter. Risks include, but not limited to, infection, bleeding, blood clot (thrombus formation), and puncture of an artery; nerve damage and irregular heartbeat and possibility to perform a PICC exchange if needed/ordered by physician.  Alternatives to this procedure were also discussed.  Bard Power PICC patient education guide, fact sheet on infection prevention and patient information card has been provided to patient /or left at bedside.    PICC Placement Documentation  PICC Single Lumen 02/23/24 Right Brachial 38 cm 1 cm (Active)  Indication for Insertion or Continuance of Line Prolonged intravenous therapies 02/23/24 1802  Exposed Catheter (cm) 1 cm 02/23/24 1802  Site Assessment Clean, Dry, Intact 02/23/24 1802  Line Status Flushed;Saline locked;Blood return noted 02/23/24 1802  Dressing Type Transparent;Securing device 02/23/24 1802  Dressing Status Antimicrobial disc/dressing in place;Clean, Dry, Intact 02/23/24 1802  Line Care Connections checked and tightened 02/23/24 1802  Line Adjustment (NICU/IV Team Only) No 02/23/24 1802  Dressing Intervention New dressing;Adhesive placed at insertion site (IV team only) 02/23/24 1802  Dressing Change Due 03/01/24 02/23/24 1802       Zackory Pudlo 02/23/2024, 6:03 PM

## 2024-02-23 NOTE — Progress Notes (Signed)
 Subjective: Procedure(s) (LRB): WOUND EXPLORATION (N/A) REMOVAL, HARDWARE (N/A) 6 Days Post-Op   The patient is resting comfortably donation today.  She was alert and interactive.  She was able to walk with therapy.  She reports the hardest problem she has is getting up but when she is up she feels relatively good.  Denies any headaches when getting up and walking.  She also reports that her right buttock and leg pain improved with walking.  Objective: Vital signs in last 24 hours: Temp:  [98 F (36.7 C)-98.5 F (36.9 C)] 98.4 F (36.9 C) (09/09 0500) Pulse Rate:  [70-94] 70 (09/09 0500) Resp:  [16-18] 16 (09/09 0500) BP: (125-160)/(59-86) 135/84 (09/09 0500) SpO2:  [95 %-97 %] 95 % (09/09 0500)  Intake/Output from previous day: 09/08 0701 - 09/09 0700 In: 240 [P.O.:240] Out: -   Labs: Recent Labs    02/21/24 0424 02/22/24 0227  WBC 15.7* 13.8*  RBC 3.73* 2.98*  HCT 36.1 27.9*  PLT 315 318   Recent Labs    02/21/24 0424 02/22/24 0227  NA 138 138  K 4.0 3.6  CL 104 106  CO2 22 22  BUN 21 22  CREATININE 1.02* 0.92  GLUCOSE 128* 153*  CALCIUM  8.6* 8.0*   No results for input(s): LABPT, INR in the last 72 hours.  Physical Exam: Well-appearing woman in no acute distress  The phlebitis on her wrist is changed slightly.  Is no longer is diffusely red plaque to focal knots of redness on the wrist.  Body mass index is 37.49 kg/m.  Incision C/D/I  Sensation     Right      Left  L3   Intact     Intact L4   Intact     Intact L5    Intact     Intact S1   Intact     Intact    Motor Exam     Right     Left Iliopsoas  5/5     5/5  Quad   5/5     5/5  Hamstring  5/5     5/5  Tibials Anterior 5/5     5/5  EHL   5/5     5/5  Gastrocs  5/5     5/5     MRI of the hip: IMPRESSION: 1. Heterogeneous wall thickening and enhancement throughout the rectosigmoid colon with surrounding inflammation and a small amount of free pelvic fluid, suspicious for  colitis. No evidence of organized fluid collection. Consider further evaluation with abdominopelvic CT. 2. No evidence of septic arthritis or osteomyelitis in the pelvis or right hip. 3. Partial insertional tear of the right gluteus medius tendon with associated soft tissue enhancement. Mild tendinosis and partial tearing of the common hamstring tendons bilaterally. 4. Mild degenerative changes of both hips and sacroiliac joints. 5. Postsurgical changes in the lower lumbar spine, details dictated separately.   Assessment/Plan: From a spine point, the patient seems to be doing better.  She has no positional headaches.  She still has pain in the buttock but this gets better with ambulation.  She may continue to mobilize with physical therapy.  The patient's fever curve has improved.  No new blood tests are back from today, but we will check her white cell count.  Recommend continue with IV antibiotics per ID.  The phlebitis has improved in some ways but still has 2 focal knots.  Recommend hot compresses.  Continue the IV antibiotic.  MRI of  the hip is concerning for some colitis in the abdomen.  They did recommend a CT scan.  Given the concern that I did raise of possible abdominal involvement, I will go ahead and order this.  The MRI did not show any signs of infection.  But did notice some degenerative changes in the SI joint as well as in the abductor muscles.  Patient was seen evaluate by rehab.  Once she was stable medically, I would agree with recommendation for a rehab placement  Cordella Rhein, MD, MS [facility] [phone]

## 2024-02-23 NOTE — Progress Notes (Signed)
 Occupational Therapy Treatment Patient Details Name: Diane Watkins MRN: [id number] DOB: 07-06-1950 Today's Date: 02/23/2024   History of present illness Pt is a 73 y/o F presenting to ED 8/31 with persistent headache.  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 MRI showing persistent leak, underwent exploration of the lumbar wound with closure of dural defect on 9/3. PMH of HTN, HLD, DM2, GERD, and a recent transforaminal lumbar decompression and fusion at L4-5 on 02/08/2024   OT comments  Pt progressing toward goals this session, needs min-mod A for ADLs, and supervision for transfers with RW. Pt able to stand at sink x20 min for grooming and UB dressing tasks, and then ambulate hallway distance without need for rest break. Pt needs min cues for back prec throughout session. Pt presenting with impairments listed below, will follow acutely. Patient will benefit from intensive inpatient follow-up therapy, >3 hours/day to maximize safety/ind with ADL/functional mobility.       If plan is discharge home, recommend the following:  A lot of help with walking and/or transfers;A lot of help with bathing/dressing/bathroom;Assistance with cooking/housework;Direct supervision/assist for medications management;Direct supervision/assist for financial management;Assist for transportation;Help with stairs or ramp for entrance   Equipment Recommendations  Other (comment) (defer)    Recommendations for Other Services PT consult    Precautions / Restrictions Precautions Precautions: Back;Fall Precaution Booklet Issued: No Recall of Precautions/Restrictions: Impaired Precaution/Restrictions Comments: verbally reviewed back prec Required Braces or Orthoses: Other Brace Other Brace: no brace needed per orders Restrictions Weight  Bearing Restrictions Per Provider Order: No       Mobility Bed Mobility               General bed mobility comments: OOB on BSC upon arrival, in chair at departure    Transfers Overall transfer level: Needs assistance Equipment used: Rolling walker (2 wheels) Transfers: Sit to/from Stand, Bed to chair/wheelchair/BSC Sit to Stand: Supervision                 Balance Overall balance assessment: Needs assistance Sitting-balance support: Feet supported Sitting balance-Leahy Scale: Good     Standing balance support: Bilateral upper extremity supported Standing balance-Leahy Scale: Fair Standing balance comment: static standing at sink without AD                           ADL either performed or assessed with clinical judgement   ADL Overall ADL's : Needs assistance/impaired     Grooming: Wash/dry face;Oral care;Applying deodorant;Brushing hair;Standing;Supervision/safety           Upper Body Dressing : Minimal assistance;Standing       Toilet Transfer: Supervision/safety;Ambulation;Rolling walker (2 wheels);Regular Toilet   Toileting- Clothing Manipulation and Hygiene: Moderate assistance Toileting - Clothing Manipulation Details (indicate cue type and reason): for posterior pericare     Functional mobility during ADLs: Supervision/safety;Rolling walker (2 wheels)      Extremity/Trunk Assessment Upper Extremity Assessment Upper Extremity Assessment: Generalized weakness (mild RUE tremoring)   Lower Extremity Assessment Lower Extremity Assessment: Defer to PT evaluation        Vision   Vision Assessment?: No apparent visual deficits   Perception Perception Perception: Not tested   Praxis Praxis Praxis: Not tested   Communication Communication Communication: No apparent difficulties   Cognition Arousal: Alert Behavior During Therapy: [facility] for  tasks assessed/performed Cognition: No apparent impairments                                Following commands: Intact        Cueing   Cueing Techniques: Verbal cues  Exercises      Shoulder Instructions       General Comments VSS    Pertinent Vitals/ Pain       Pain Assessment Pain Assessment: Faces Pain Score: 6  Faces Pain Scale: Hurts even more Pain Location: R hip in sitting, headache Pain Descriptors / Indicators: Aching, Headache Pain Intervention(s): Limited activity within patient's tolerance, Monitored during session, Repositioned, Patient requesting pain meds-RN notified  Home Living                                          Prior Functioning/Environment              Frequency  Min 2X/week        Progress Toward Goals  OT Goals(current goals can now be found in the care plan section)  Progress towards OT goals: Progressing toward goals  Acute Rehab OT Goals Patient Stated Goal: to get pain medicine OT Goal Formulation: With patient Time For Goal Achievement: 03/06/24 Potential to Achieve Goals: Good ADL Goals Pt Will Perform Upper Body Dressing: with contact guard assist;sitting Pt Will Perform Lower Body Dressing: with min assist;sitting/lateral leans;sit to/from stand Pt Will Transfer to Toilet: with min assist;ambulating;regular height toilet Pt Will Perform Tub/Shower Transfer: Tub transfer;Shower transfer;with min assist;ambulating;tub bench;rolling walker  Plan      Co-evaluation                 AM-PAC OT 6 Clicks Daily Activity     Outcome Measure   Help from another person eating meals?: A Little Help from another person taking care of personal grooming?: A Little Help from another person toileting, which includes using toliet, bedpan, or urinal?: A Lot Help from another person bathing (including washing, rinsing, drying)?: A Lot Help from another person to put on and taking off regular upper body clothing?: A Little Help from another person to put on and taking off regular  lower body clothing?: A Lot 6 Click Score: 15    End of Session Equipment Utilized During Treatment: Gait belt;Rolling walker (2 wheels)  OT Visit Diagnosis: Unsteadiness on feet (R26.81);Muscle weakness (generalized) (M62.81);Pain   Activity Tolerance Patient tolerated treatment well   Patient Left in chair;with call bell/phone within reach;with family/visitor present   Nurse Communication Mobility status;Patient requests pain meds (and xanax )        Time: 8499-8455 OT Time Calculation (min): 44 min  Charges: OT General Charges $OT Visit: 1 Visit OT Treatments $Self Care/Home Management : 23-37 mins $Therapeutic Activity: 8-22 mins  Diane Watkins, OTD, OTR/L SecureChat Preferred Acute Rehab [phone]   Diane Watkins 02/23/2024, 4:29 PM

## 2024-02-23 NOTE — Plan of Care (Signed)

## 2024-02-23 NOTE — Progress Notes (Signed)
 Physical Therapy Treatment Patient Details Name: Diane Watkins MRN: [id number] DOB: April 14, 1951 Today's Date: 02/23/2024   History of Present Illness Pt is a 73 y/o F presenting to ED 8/31 with persistent headache.  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 MRI showing persistent leak, underwent exploration of the lumbar wound with closure of dural defect on 9/3. PMH of HTN, HLD, DM2, GERD, and a recent transforaminal lumbar decompression and fusion at L4-5 on 02/08/2024    PT Comments  Pt resting in bed on arrival and agreeable to session. Pt continues to be limited by R hip pain and returning headache this session, limiting standing and upright tolerance. Pt continues to require cues throughout mobility for adherence to precautions. Pt requiring mod A to complete bed mobility and up to min A during transfers and gait with RW for support to assist with maintaining balance, especially with turning. Pt agreeable to time up in chair at end of session. Pt continues to benefit from skilled PT services to progress toward functional mobility goals.      If plan is discharge home, recommend the following: Two people to help with walking and/or transfers;Two people to help with bathing/dressing/bathroom;Assistance with cooking/housework;Direct supervision/assist for medications management;Direct supervision/assist for financial management;Assist for transportation;Help with stairs or ramp for entrance;Supervision due to cognitive status   Can travel by private vehicle        Equipment Recommendations  Rolling walker (2 wheels)    Recommendations for Other Services       Precautions / Restrictions Precautions Precautions: Back;Fall Precaution Booklet Issued: No Recall of Precautions/Restrictions:  Impaired Precaution/Restrictions Comments: verbally reviewed back prec Required Braces or Orthoses: Other Brace Other Brace: no brace needed per orders Restrictions Weight Bearing Restrictions Per Provider Order: No     Mobility  Bed Mobility Overal bed mobility: Needs Assistance Bed Mobility: Sidelying to Sit, Rolling Rolling: Min assist Sidelying to sit: Mod assist       General bed mobility comments: assist to manage LE and trunk    Transfers Overall transfer level: Needs assistance Equipment used: Rolling walker (2 wheels) Transfers: Sit to/from Stand, Bed to chair/wheelchair/BSC Sit to Stand: Min assist   Step pivot transfers: Min assist       General transfer comment: cues for hand placement, slow to rise. light assist to steady on rise and during step pivot    Ambulation/Gait Ambulation/Gait assistance: Contact guard assist, +2 safety/equipment, Min assist Gait Distance (Feet): 125 Feet Assistive device: Rolling walker (2 wheels) Gait Pattern/deviations: Step-through pattern, Decreased stride length Gait velocity: decreased     General Gait Details: up to min A to maintain balance, cues for closer RW proximity and upright posture, distance limited due to fatigue and headache   Stairs             Wheelchair Mobility     Tilt Bed    Modified Rankin (Stroke Patients Only)       Balance Overall balance assessment: Needs assistance Sitting-balance support: Feet supported Sitting balance-Leahy Scale: Good     Standing balance support: Bilateral upper extremity supported Standing balance-Leahy Scale: Poor Standing balance comment: dependent on BUE support                            Communication Communication Communication: No apparent difficulties  Cognition Arousal: Alert Behavior During Therapy: WFL for tasks assessed/performed   PT - Cognitive impairments: Awareness, Safety/Judgement                          Following commands: Intact      Cueing Cueing Techniques: Verbal cues  Exercises      General Comments General comments (skin integrity, edema, etc.): VSS      Pertinent Vitals/Pain Pain Assessment Pain Assessment: Faces Faces Pain Scale: Hurts even more Pain Location: R hip in sitting, headache Pain Descriptors / Indicators: Aching, Headache Pain Intervention(s): Monitored during session, Limited activity within patient's tolerance    Home Living                          Prior Function            PT Goals (current goals can now be found in the care plan section) Acute Rehab PT Goals Patient Stated Goal: to reduce pain, get outof bed PT Goal Formulation: With patient Time For Goal Achievement: 03/06/24 Progress towards PT goals: Progressing toward goals    Frequency    Min 5X/week      PT Plan      Co-evaluation              AM-PAC PT 6 Clicks Mobility   Outcome Measure  Help needed turning from your back to your side while in a flat bed without using bedrails?: A Lot Help needed moving from lying on your back to sitting on the side of a flat bed without using bedrails?: A Lot Help needed moving to and from a bed to a chair (including a wheelchair)?: A Lot Help needed standing up from a chair using your arms (e.g., wheelchair or bedside chair)?: A Little Help needed to walk in hospital room?: A Little Help needed climbing 3-5 steps with a railing? : Total 6 Click Score: 13    End of Session   Activity Tolerance: Patient tolerated treatment well Patient left: with call bell/phone within reach;in chair (with door open) Nurse Communication: Mobility status PT Visit Diagnosis: Pain;Difficulty in walking, not elsewhere classified (R26.2) Pain - part of body:  (headache)     Time: 8851-8787 PT Time Calculation (min) (ACUTE ONLY): 24 min  Charges:    $Gait Training: 8-22 mins $Therapeutic Activity: 8-22 mins PT General Charges $$  ACUTE PT VISIT: 1 Visit                     Therisa R. PTA Acute Rehabilitation Services Office: [phone]   Therisa CHRISTELLA Boor 02/23/2024, 12:47 PM

## 2024-02-24 ENCOUNTER — Other Ambulatory Visit: Payer: Self-pay

## 2024-02-24 ENCOUNTER — Inpatient Hospital Stay (HOSPITAL_COMMUNITY)
Admission: AD | Admit: 2024-02-24 | Discharge: 2024-03-08 | DRG: 091 | Disposition: A | Discharge status: Home Health | Source: Intra-hospital | Attending: Physical Medicine and Rehabilitation | Admitting: Physical Medicine and Rehabilitation

## 2024-02-24 ENCOUNTER — Encounter (HOSPITAL_COMMUNITY): Payer: Self-pay | Admitting: Physical Medicine and Rehabilitation

## 2024-02-24 DIAGNOSIS — E538 Deficiency of other specified B group vitamins: Secondary | ICD-10-CM | POA: Diagnosis not present

## 2024-02-24 DIAGNOSIS — G4700 Insomnia, unspecified: Secondary | ICD-10-CM | POA: Diagnosis not present

## 2024-02-24 DIAGNOSIS — G9609 Other spinal cerebrospinal fluid leak: Secondary | ICD-10-CM | POA: Diagnosis not present

## 2024-02-24 DIAGNOSIS — Z79899 Other long term (current) drug therapy: Secondary | ICD-10-CM

## 2024-02-24 DIAGNOSIS — R3915 Urgency of urination: Secondary | ICD-10-CM | POA: Diagnosis not present

## 2024-02-24 DIAGNOSIS — R351 Nocturia: Secondary | ICD-10-CM | POA: Diagnosis not present

## 2024-02-24 DIAGNOSIS — G09 Sequelae of inflammatory diseases of central nervous system: Principal | ICD-10-CM | POA: Diagnosis present

## 2024-02-24 DIAGNOSIS — R291 Meningismus: Secondary | ICD-10-CM | POA: Diagnosis not present

## 2024-02-24 DIAGNOSIS — R7881 Bacteremia: Secondary | ICD-10-CM | POA: Diagnosis not present

## 2024-02-24 DIAGNOSIS — R7989 Other specified abnormal findings of blood chemistry: Secondary | ICD-10-CM | POA: Diagnosis present

## 2024-02-24 DIAGNOSIS — I10 Essential (primary) hypertension: Secondary | ICD-10-CM | POA: Diagnosis present

## 2024-02-24 DIAGNOSIS — Z9049 Acquired absence of other specified parts of digestive tract: Secondary | ICD-10-CM | POA: Diagnosis not present

## 2024-02-24 DIAGNOSIS — D649 Anemia, unspecified: Secondary | ICD-10-CM | POA: Diagnosis not present

## 2024-02-24 DIAGNOSIS — E669 Obesity, unspecified: Secondary | ICD-10-CM | POA: Diagnosis not present

## 2024-02-24 DIAGNOSIS — Z880 Allergy status to penicillin: Secondary | ICD-10-CM

## 2024-02-24 DIAGNOSIS — T8463XA Infection and inflammatory reaction due to internal fixation device of spine, initial encounter: Secondary | ICD-10-CM | POA: Diagnosis not present

## 2024-02-24 DIAGNOSIS — K529 Noninfective gastroenteritis and colitis, unspecified: Secondary | ICD-10-CM | POA: Diagnosis not present

## 2024-02-24 DIAGNOSIS — Z9181 History of falling: Secondary | ICD-10-CM | POA: Diagnosis not present

## 2024-02-24 DIAGNOSIS — Z885 Allergy status to narcotic agent status: Secondary | ICD-10-CM | POA: Diagnosis not present

## 2024-02-24 DIAGNOSIS — R519 Headache, unspecified: Secondary | ICD-10-CM | POA: Diagnosis present

## 2024-02-24 DIAGNOSIS — K219 Gastro-esophageal reflux disease without esophagitis: Secondary | ICD-10-CM | POA: Diagnosis not present

## 2024-02-24 DIAGNOSIS — R251 Tremor, unspecified: Secondary | ICD-10-CM | POA: Diagnosis present

## 2024-02-24 DIAGNOSIS — G9600 Cerebrospinal fluid leak, unspecified: Secondary | ICD-10-CM | POA: Diagnosis not present

## 2024-02-24 DIAGNOSIS — Z823 Family history of stroke: Secondary | ICD-10-CM | POA: Diagnosis not present

## 2024-02-24 DIAGNOSIS — R5381 Other malaise: Secondary | ICD-10-CM | POA: Diagnosis not present

## 2024-02-24 DIAGNOSIS — E119 Type 2 diabetes mellitus without complications: Secondary | ICD-10-CM | POA: Diagnosis present

## 2024-02-24 DIAGNOSIS — E1140 Type 2 diabetes mellitus with diabetic neuropathy, unspecified: Secondary | ICD-10-CM | POA: Diagnosis not present

## 2024-02-24 DIAGNOSIS — G9782 Other postprocedural complications and disorders of nervous system: Principal | ICD-10-CM | POA: Diagnosis present

## 2024-02-24 DIAGNOSIS — Z792 Long term (current) use of antibiotics: Secondary | ICD-10-CM | POA: Diagnosis not present

## 2024-02-24 DIAGNOSIS — Z981 Arthrodesis status: Secondary | ICD-10-CM

## 2024-02-24 DIAGNOSIS — Z8249 Family history of ischemic heart disease and other diseases of the circulatory system: Secondary | ICD-10-CM

## 2024-02-24 DIAGNOSIS — Z452 Encounter for adjustment and management of vascular access device: Secondary | ICD-10-CM | POA: Diagnosis not present

## 2024-02-24 DIAGNOSIS — Z6837 Body mass index (BMI) 37.0-37.9, adult: Secondary | ICD-10-CM

## 2024-02-24 DIAGNOSIS — M170 Bilateral primary osteoarthritis of knee: Secondary | ICD-10-CM | POA: Diagnosis present

## 2024-02-24 DIAGNOSIS — E7800 Pure hypercholesterolemia, unspecified: Secondary | ICD-10-CM | POA: Diagnosis present

## 2024-02-24 DIAGNOSIS — G039 Meningitis, unspecified: Secondary | ICD-10-CM | POA: Diagnosis not present

## 2024-02-24 DIAGNOSIS — R269 Unspecified abnormalities of gait and mobility: Secondary | ICD-10-CM | POA: Diagnosis present

## 2024-02-24 DIAGNOSIS — R000 Tachycardia, unspecified: Secondary | ICD-10-CM | POA: Diagnosis not present

## 2024-02-24 DIAGNOSIS — F5104 Psychophysiologic insomnia: Secondary | ICD-10-CM | POA: Diagnosis present

## 2024-02-24 DIAGNOSIS — Z48811 Encounter for surgical aftercare following surgery on the nervous system: Secondary | ICD-10-CM

## 2024-02-24 DIAGNOSIS — R059 Cough, unspecified: Secondary | ICD-10-CM | POA: Diagnosis not present

## 2024-02-24 DIAGNOSIS — G9341 Metabolic encephalopathy: Secondary | ICD-10-CM | POA: Diagnosis present

## 2024-02-24 DIAGNOSIS — E785 Hyperlipidemia, unspecified: Secondary | ICD-10-CM | POA: Diagnosis not present

## 2024-02-24 DIAGNOSIS — Y838 Other surgical procedures as the cause of abnormal reaction of the patient, or of later complication, without mention of misadventure at the time of the procedure: Secondary | ICD-10-CM | POA: Diagnosis present

## 2024-02-24 DIAGNOSIS — Z01818 Encounter for other preprocedural examination: Secondary | ICD-10-CM

## 2024-02-24 DIAGNOSIS — E782 Mixed hyperlipidemia: Secondary | ICD-10-CM

## 2024-02-24 DIAGNOSIS — S76011D Strain of muscle, fascia and tendon of right hip, subsequent encounter: Secondary | ICD-10-CM | POA: Diagnosis not present

## 2024-02-24 DIAGNOSIS — D638 Anemia in other chronic diseases classified elsewhere: Secondary | ICD-10-CM | POA: Diagnosis present

## 2024-02-24 DIAGNOSIS — B9689 Other specified bacterial agents as the cause of diseases classified elsewhere: Secondary | ICD-10-CM | POA: Diagnosis not present

## 2024-02-24 DIAGNOSIS — F419 Anxiety disorder, unspecified: Secondary | ICD-10-CM | POA: Diagnosis not present

## 2024-02-24 DIAGNOSIS — Z811 Family history of alcohol abuse and dependence: Secondary | ICD-10-CM

## 2024-02-24 DIAGNOSIS — K5900 Constipation, unspecified: Secondary | ICD-10-CM | POA: Diagnosis not present

## 2024-02-24 DIAGNOSIS — M549 Dorsalgia, unspecified: Secondary | ICD-10-CM | POA: Diagnosis present

## 2024-02-24 HISTORY — DX: Cerebrospinal fluid leak, unspecified: G96.00

## 2024-02-24 LAB — GLUCOSE, CAPILLARY
Glucose-Capillary: 122 mg/dL — ABNORMAL HIGH (ref 70–99)
Glucose-Capillary: 118 mg/dL — ABNORMAL HIGH (ref 70–99)

## 2024-02-24 LAB — CBC
HCT: 29.4 % — ABNORMAL LOW (ref 36.0–46.0)
Hemoglobin: 9.8 g/dL — ABNORMAL LOW (ref 12.0–15.0)
MCH: 30.9 pg (ref 26.0–34.0)
MCHC: 33.3 g/dL (ref 30.0–36.0)
MCV: 92.7 fL (ref 80.0–100.0)
Platelets: 422 K/uL — ABNORMAL HIGH (ref 150–400)
RBC: 3.17 MIL/uL — ABNORMAL LOW (ref 3.87–5.11)
RDW: 13.7 % (ref 11.5–15.5)
WBC: 11.4 K/uL — ABNORMAL HIGH (ref 4.0–10.5)
nRBC: 0 % (ref 0.0–0.2)

## 2024-02-24 LAB — COMPREHENSIVE METABOLIC PANEL WITH GFR
ALT: 62 U/L — ABNORMAL HIGH (ref 0–44)
AST: 76 U/L — ABNORMAL HIGH (ref 15–41)
Albumin: 2.2 g/dL — ABNORMAL LOW (ref 3.5–5.0)
Alkaline Phosphatase: 51 U/L (ref 38–126)
Anion gap: 9 (ref 5–15)
BUN: 17 mg/dL (ref 8–23)
CO2: 23 mmol/L (ref 22–32)
Calcium: 8.6 mg/dL — ABNORMAL LOW (ref 8.9–10.3)
Chloride: 105 mmol/L (ref 98–111)
Creatinine, Ser: 0.88 mg/dL (ref 0.44–1.00)
GFR, Estimated: >60 mL/min (ref >60)
Glucose, Bld: 128 mg/dL — ABNORMAL HIGH (ref 70–99)
Potassium: 3.9 mmol/L (ref 3.5–5.1)
Sodium: 137 mmol/L (ref 135–145)
Total Bilirubin: 0.5 mg/dL (ref 0.0–1.2)
Total Protein: 5.3 g/dL — ABNORMAL LOW (ref 6.5–8.1)

## 2024-02-24 MED ORDER — TRAZODONE HCL 50 MG PO TABS: 25.0000 mg | ORAL_TABLET | Freq: Every evening | ORAL | Status: DC | PRN

## 2024-02-24 MED ORDER — CEFEPIME IV (FOR PTA / DISCHARGE USE ONLY): 2.0000 g | Freq: Three times a day (TID) | INTRAVENOUS | 0 refills | Status: DC

## 2024-02-24 MED ORDER — INSULIN ASPART 100 UNIT/ML IJ SOLN: 0.0000 [IU] | Freq: Three times a day (TID) | INTRAMUSCULAR | Status: DC

## 2024-02-24 MED ORDER — MAGNESIUM GLUCONATE 500 (27 MG) MG PO TABS
250.0000 mg | ORAL_TABLET | Freq: Every day | ORAL | Status: DC
  Administered 2024-02-24 – 2024-03-07 (×13): 250 mg via ORAL
  Filled 2024-02-24 (×13): qty 1

## 2024-02-24 MED ORDER — DIPHENHYDRAMINE HCL 25 MG PO CAPS
25.0000 mg | ORAL_CAPSULE | Freq: Four times a day (QID) | ORAL | Status: DC | PRN
  Administered 2024-02-27 – 2024-03-04 (×2): 25 mg via ORAL
  Filled 2024-02-24 (×2): qty 1

## 2024-02-24 MED ORDER — PROCHLORPERAZINE EDISYLATE 10 MG/2ML IJ SOLN
5.0000 mg | Freq: Four times a day (QID) | INTRAMUSCULAR | Status: DC | PRN
  Administered 2024-02-25 – 2024-03-05 (×4): 10 mg via INTRAVENOUS
  Filled 2024-02-24 (×4): qty 2

## 2024-02-24 MED ORDER — ENOXAPARIN SODIUM 40 MG/0.4ML IJ SOSY
40.0000 mg | PREFILLED_SYRINGE | INTRAMUSCULAR | Status: DC
  Administered 2024-02-25 – 2024-03-07 (×12): 40 mg via SUBCUTANEOUS
  Filled 2024-02-24 (×12): qty 0.4

## 2024-02-24 MED ORDER — OMEGA-3-ACID ETHYL ESTERS 1 G PO CAPS
2.0000 | ORAL_CAPSULE | Freq: Two times a day (BID) | ORAL | Status: DC
  Administered 2024-02-24 – 2024-03-08 (×26): 2 g via ORAL
  Filled 2024-02-24 (×27): qty 2

## 2024-02-24 MED ORDER — ROSUVASTATIN CALCIUM 20 MG PO TABS
40.0000 mg | ORAL_TABLET | Freq: Every evening | ORAL | Status: DC
  Administered 2024-02-24 – 2024-03-07 (×13): 40 mg via ORAL
  Filled 2024-02-24 (×8): qty 2
  Filled 2024-02-24: qty 8
  Filled 2024-02-24 (×5): qty 2

## 2024-02-24 MED ORDER — CYCLOBENZAPRINE HCL 5 MG PO TABS
10.0000 mg | ORAL_TABLET | Freq: Three times a day (TID) | ORAL | Status: DC | PRN
  Filled 2024-02-24: qty 2

## 2024-02-24 MED ORDER — CHLORHEXIDINE GLUCONATE CLOTH 2 % EX PADS: 6.0000 | MEDICATED_PAD | Freq: Two times a day (BID) | CUTANEOUS | Status: DC

## 2024-02-24 MED ORDER — ENOXAPARIN SODIUM 40 MG/0.4ML IJ SOSY: 40.0000 mg | PREFILLED_SYRINGE | INTRAMUSCULAR | Status: DC

## 2024-02-24 MED ORDER — CHLORHEXIDINE GLUCONATE CLOTH 2 % EX PADS
6.0000 | MEDICATED_PAD | Freq: Two times a day (BID) | CUTANEOUS | Status: DC
  Administered 2024-02-25 – 2024-03-08 (×25): 6 via TOPICAL

## 2024-02-24 MED ORDER — PROCHLORPERAZINE 25 MG RE SUPP: 12.5000 mg | Freq: Four times a day (QID) | RECTAL | Status: DC | PRN

## 2024-02-24 MED ORDER — SODIUM CHLORIDE 0.9 % IV SOLN
2.0000 g | Freq: Three times a day (TID) | INTRAVENOUS | Status: DC
  Administered 2024-02-24 – 2024-03-08 (×38): 2 g via INTRAVENOUS
  Filled 2024-02-24 (×42): qty 12.5

## 2024-02-24 MED ORDER — ALPRAZOLAM 0.25 MG PO TABS
0.2500 mg | ORAL_TABLET | Freq: Three times a day (TID) | ORAL | Status: DC | PRN
  Administered 2024-02-26 – 2024-03-07 (×4): 0.25 mg via ORAL
  Filled 2024-02-24 (×5): qty 1

## 2024-02-24 MED ORDER — GABAPENTIN 100 MG PO CAPS
100.0000 mg | ORAL_CAPSULE | Freq: Three times a day (TID) | ORAL | Status: DC
Start: 2024-02-24 — End: 2024-02-25
  Administered 2024-02-24 – 2024-02-25 (×3): 100 mg via ORAL
  Filled 2024-02-24 (×3): qty 1

## 2024-02-24 MED ORDER — ALUM & MAG HYDROXIDE-SIMETH 200-200-20 MG/5ML PO SUSP
30.0000 mL | ORAL | Status: DC | PRN
  Administered 2024-02-26 – 2024-03-06 (×2): 30 mL via ORAL
  Filled 2024-02-24 (×2): qty 30

## 2024-02-24 MED ORDER — BISACODYL 10 MG RE SUPP: 10.0000 mg | Freq: Every day | RECTAL | Status: DC | PRN

## 2024-02-24 MED ORDER — DICLOFENAC SODIUM 1 % EX GEL
2.0000 g | Freq: Four times a day (QID) | CUTANEOUS | Status: DC
  Administered 2024-02-24 – 2024-03-06 (×35): 2 g via TOPICAL
  Filled 2024-02-24: qty 100

## 2024-02-24 MED ORDER — HYDROCODONE-ACETAMINOPHEN 5-325 MG PO TABS
1.0000 | ORAL_TABLET | ORAL | Status: DC | PRN
Start: 2024-02-24 — End: 2024-02-25
  Administered 2024-02-24 – 2024-02-25 (×3): 1 via ORAL
  Filled 2024-02-24 (×5): qty 1

## 2024-02-24 MED ORDER — GUAIFENESIN-DM 100-10 MG/5ML PO SYRP: 5.0000 mL | ORAL_SOLUTION | Freq: Four times a day (QID) | ORAL | Status: DC | PRN

## 2024-02-24 MED ORDER — VITAMIN B-12 1000 MCG PO TABS
1000.0000 ug | ORAL_TABLET | Freq: Every day | ORAL | Status: DC
  Administered 2024-02-25 – 2024-03-08 (×13): 1000 ug via ORAL
  Filled 2024-02-24 (×13): qty 1

## 2024-02-24 MED ORDER — CEFEPIME IV (FOR PTA / DISCHARGE USE ONLY): 2.0000 g | Freq: Three times a day (TID) | INTRAVENOUS | Status: DC

## 2024-02-24 MED ORDER — B COMPLEX-C PO TABS
1.0000 | ORAL_TABLET | Freq: Every day | ORAL | Status: DC
  Administered 2024-02-25 – 2024-03-08 (×13): 1 via ORAL
  Filled 2024-02-24 (×13): qty 1

## 2024-02-24 MED ORDER — INSULIN ASPART 100 UNIT/ML IJ SOLN: 0.0000 [IU] | Freq: Every day | INTRAMUSCULAR | Status: DC

## 2024-02-24 MED ORDER — FLEET ENEMA RE ENEM: 1.0000 | ENEMA | Freq: Once | RECTAL | Status: DC | PRN

## 2024-02-24 MED ORDER — POLYVINYL ALCOHOL 1.4 % OP SOLN: 1.0000 [drp] | Freq: Three times a day (TID) | OPHTHALMIC | Status: DC | PRN

## 2024-02-24 MED ORDER — TRAMADOL HCL 50 MG PO TABS
50.0000 mg | ORAL_TABLET | Freq: Four times a day (QID) | ORAL | Status: DC | PRN
  Administered 2024-03-02 – 2024-03-08 (×6): 50 mg via ORAL
  Filled 2024-02-24 (×8): qty 1

## 2024-02-24 MED ORDER — LISINOPRIL 20 MG PO TABS
20.0000 mg | ORAL_TABLET | Freq: Every evening | ORAL | Status: DC
  Administered 2024-02-24 – 2024-03-07 (×13): 20 mg via ORAL
  Filled 2024-02-24 (×14): qty 1

## 2024-02-24 MED ORDER — PROCHLORPERAZINE MALEATE 5 MG PO TABS
5.0000 mg | ORAL_TABLET | Freq: Four times a day (QID) | ORAL | Status: DC | PRN
  Administered 2024-02-26 – 2024-03-06 (×4): 10 mg via ORAL
  Filled 2024-02-24 (×5): qty 2

## 2024-02-24 MED ORDER — LORATADINE 10 MG PO TABS
10.0000 mg | ORAL_TABLET | Freq: Every day | ORAL | Status: DC
  Administered 2024-02-25 – 2024-03-08 (×13): 10 mg via ORAL
  Filled 2024-02-24 (×13): qty 1

## 2024-02-24 MED ORDER — MAGNESIUM GLUCONATE 500 (27 MG) MG PO TABS: 250.0000 mg | ORAL_TABLET | Freq: Every day | ORAL | Status: DC

## 2024-02-24 NOTE — Discharge Summary (Signed)
 Physician Discharge Summary  Diane Watkins FMW:[id number] DOB: May 28, 1951 DOA: 02/14/2024  PCP: Watkins, Diane G, DO  Admit date: 02/14/2024 Discharge date: 02/24/2024  Admitted From: home Disposition:  CIR  Discharge Condition: stable CODE STATUS: Full code Diet Orders (From admission, onward)     Start     Ordered   02/17/24 2137  Diet regular Room service appropriate? Yes; Fluid consistency: Thin  Diet effective now       Question Answer Comment  Room service appropriate? Yes   Fluid consistency: Thin      02/17/24 2136            Brief Narrative / Interim history: Diane Watkins is a 73 y.o. year old female with PMH of  HTN, HLD, DM2, GERD, and a recent transforaminal lumbar decompression and fusion at L4-5 on 02/08/2024 with discharge from the hospital on 02/10/2024. She reported a headache at the time of her discharge but clinical symptoms were felt to most likely represent a tension headache. She returned to the ER 8/31 with persistent unrelenting severe generalized headache.  She had stopped taking her prescribed narcotic pain medication due to itching.  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 Patient admitted for persistent headache, orthospine surgery consulted. Patient completed MRI showing persistent leak. 9/3 after second opinion by Dr. Joshua underwent exploration of the lumbar wound with closure of dural defect.  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  She initially failed to  improve with conservative rx >after second opinion by Dr. 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  ID consulted, recommending 8 weeks of IV antibiotics.  PICC line placed 9/9.  There was concern for rectosigmoid colitis based on MRI, repeat CT scan of the abdomen pelvis 9/9 without acute findings in the abdomen or pelvis Acute metabolic encephalopathy-resolved. 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 TSH 4.7, ammonia normal, b12 low-being replaced. Some right hand tremor previously-cont to monitornt. Postoperative anemia- possible ABLA Anemia of chronic illness -hemoglobin overall stable, monitor periodically HTN - Stable,on lisinopril . HLD - Continue home atorvastatin  and Lovaza . Constipation - Having bowel movement - now continue stool softener  Class II Obesity - body mass index is 37.49 kg/m  Sepsis ruled out   Discharge Instructions  Discharge Instructions     [facility] pharmacist to adjust dose for Vancomycin , Aminoglycosides and other anti-infective therapies as requested by physician.   Complete by: As directed  [facility] to provide Cath Flo 2mg    Complete by: As directed    Administer for PICC line occlusion and as ordered by  physician for other access device issues.   Anaphylaxis Kit: Provided to treat any anaphylactic reaction to the medication being provided to the patient if First Dose or when requested by physician   Complete by: As directed    Epinephrine  1mg /ml vial / amp: Administer 0.3mg  (0.72ml) subcutaneously once for moderate to severe anaphylaxis, nurse to call physician and pharmacy when reaction occurs and call 911 if needed for immediate care   Diphenhydramine  50mg /ml IV vial: Administer 25-50mg  IV/IM PRN for first dose reaction, rash, itching, mild reaction, nurse to call physician and pharmacy when reaction occurs   Sodium Chloride  0.9% NS 500ml IV: Administer if needed for hypovolemic blood pressure drop or as ordered by physician after call to physician with anaphylactic reaction   Change dressing on IV access line weekly and PRN   Complete by: As directed    Flush IV access with Sodium Chloride  0.9% and Heparin 10 units/ml or 100 units/ml   Complete by: As directed    Home infusion instructions - [facility]   Complete by: As directed    Instructions: Flush IV access with Sodium Chloride  0.9% and Heparin 10units/ml or 100units/ml   Change dressing on IV access line: Weekly and PRN   Instructions Cath Flo 2mg : Administer for PICC Line occlusion and as ordered by physician for other access device   [facility] pharmacist to adjust dose for: Vancomycin , Aminoglycosides and other anti-infective therapies as requested by physician   Method of administration may be changed at the discretion of home infusion pharmacist based upon assessment of the patient and/or caregiver's ability to self-administer the medication ordered   Complete by: As directed       Allergies as of 02/24/2024       Reactions   Amoxil [amoxicillin] Other (See Comments)   Bad headaches Has patient had a PCN reaction causing immediate rash, facial/tongue/throat swelling, SOB or lightheadedness with  hypotension: No Has patient had a PCN reaction causing severe rash involving mucus membranes or skin necrosis: No Has patient had a PCN reaction that required hospitalization: No Has patient had a PCN reaction occurring within the last 10 years: No If all of the above answers are NO, then may proceed with Cephalosporin use.   Oxycodone -acetaminophen  Itching        Medication List     TAKE these medications    acetaminophen  325 MG tablet Commonly known as: TYLENOL  Take 650 mg by mouth every 6 (six) hours as needed (pain.).   ceFEPime  IVPB Commonly known as: MAXIPIME  Inject 2 g into the vein every 8 (eight) hours. Indication:  Enterobacter cloacae bacteremia/lumbar hardware infection First Dose: Yes Last Day of Therapy:  04/14/24  Labs - Once weekly:  CBC/D and BMP, Labs - Once weekly: ESR and CRP Method of administration: IV Push Method of administration may be changed at the discretion of home infusion pharmacist based upon assessment of the patient and/or caregiver's ability to self-administer the medication ordered.   cetirizine 10 MG tablet Commonly known as: ZYRTEC Take 10 mg by mouth daily as needed for allergies.   cyclobenzaprine  10 MG tablet Commonly known as: FLEXERIL  Take 1 tablet (10 mg total) by mouth 3 (three) times daily as needed for muscle spasms.   ibuprofen 200 MG tablet Commonly known as: ADVIL Take 600 mg by mouth daily  as needed (pain.).   lisinopril  20 MG tablet Commonly known as: ZESTRIL  Take 1 tablet (20 mg total) by mouth daily. What changed: when to take this   omega-3 acid ethyl esters 1 g capsule Commonly known as: LOVAZA  Take 2 capsules (2 g total) by mouth 2 (two) times daily.   Polyethyl Glycol-Propyl Glycol 0.4-0.3 % Soln Place 1-2 drops into both eyes 3 (three) times daily as needed (pain.).   rosuvastatin  40 MG tablet Commonly known as: CRESTOR  Take 1 tablet (40 mg total) by mouth daily. What changed: when to take this                Discharge Care Instructions  (From admission, onward)           Start     Ordered   02/24/24 0000  Change dressing on IV access line weekly and PRN  (Home infusion instructions - [facility] )        02/24/24 1119             Procedures/Studies:  CT ABDOMEN PELVIS WO CONTRAST Result Date: 02/23/2024 CLINICAL DATA:  Abdominal pain, postop EXAM: CT ABDOMEN AND PELVIS WITHOUT CONTRAST TECHNIQUE: Multidetector CT imaging of the abdomen and pelvis was performed following the standard protocol without IV contrast. RADIATION DOSE REDUCTION: This exam was performed according to the departmental dose-optimization program which includes automated exposure control, adjustment of the mA and/or kV according to patient size and/or use of iterative reconstruction technique. COMPARISON:  09/27/2020 FINDINGS: Lower chest: No acute findings Hepatobiliary: No focal liver abnormality is seen. Status post cholecystectomy. No biliary dilatation. Pancreas: No focal abnormality or ductal dilatation. Spleen: No focal abnormality.  Normal size. Adrenals/Urinary Tract: No adrenal abnormality. No focal renal abnormality. No stones or hydronephrosis. Urinary bladder is unremarkable. Stomach/Bowel: Sigmoid diverticulosis. No active diverticulitis. Stomach and small bowel decompressed, unremarkable. Vascular/Lymphatic: Aortic atherosclerosis. No evidence of aneurysm or adenopathy. Reproductive: Uterus and adnexa unremarkable.  No mass. Other: No free fluid or free air. Small umbilical hernia containing fat. Evidence of prior repair of right supraumbilical ventral hernia. There appears to be a small recurrent hernia containing fat in this region. Musculoskeletal: No acute bony abnormality. IMPRESSION: No acute findings in the abdomen or pelvis. Aortic atherosclerosis. Small umbilical hernia containing fat. Prior repair of fat containing right supraumbilical ventral hernia with small hernia recurrence  containing fat. Electronically Signed   By: Franky Crease M.D.   On: 02/23/2024 23:44   US  EKG SITE RITE Result Date: 02/23/2024 If Site Rite image not attached, placement could not be confirmed due to current cardiac rhythm.  MR HIP RIGHT W WO CONTRAST Result Date: 02/22/2024 CLINICAL DATA:  Soft tissue infection suspected, hip, no prior imaging EXAM: MRI OF THE RIGHT HIP WITHOUT AND WITH CONTRAST TECHNIQUE: Multiplanar, multisequence MR imaging was performed both before and after administration of intravenous contrast. CONTRAST:  10mL GADAVIST  GADOBUTROL  1 MMOL/ML IV SOLN COMPARISON:  Abdominopelvic CT 09/27/2020, pelvic radiographs 11/20/2023 and lumbar MRI 02/21/2024. FINDINGS: Bones: There is no evidence of acute fracture, dislocation or femoral head osteonecrosis. The visualized bony pelvis appears normal. Mild sacroiliac degenerative changes bilaterally. No evidence of septic arthritis. Postsurgical changes within the lower lumbar spine, details dictated separately. Articular cartilage and labrum Articular cartilage: Mild degenerative changes of both hips. No focal chondral defect or subchondral signal abnormality. Labrum: There is no gross labral tear or paralabral abnormality. Joint or bursal effusion Joint effusion: No significant hip joint effusion or abnormal synovial enhancement.  Bursae: No focal periarticular fluid collection. Muscles and tendons Muscles and tendons: Partial insertional tear of the right gluteus medius tendon with associated soft tissue enhancement following contrast. There is also mild tendinosis and partial tearing of the common hamstring tendons bilaterally. The iliopsoas tendons appear normal. No focal muscular atrophy, edema or abnormal enhancement. The piriformis muscles appear symmetric. Other findings Miscellaneous: Heterogeneous wall thickening and enhancement throughout the rectosigmoid colon with surrounding inflammation and a small amount of free pelvic fluid, suspicious  for colitis. No evidence of organized fluid collection. The visualized internal pelvic contents otherwise unremarkable. IMPRESSION: 1. Heterogeneous wall thickening and enhancement throughout the rectosigmoid colon with surrounding inflammation and a small amount of free pelvic fluid, suspicious for colitis. No evidence of organized fluid collection. Consider further evaluation with abdominopelvic CT. 2. No evidence of septic arthritis or osteomyelitis in the pelvis or right hip. 3. Partial insertional tear of the right gluteus medius tendon with associated soft tissue enhancement. Mild tendinosis and partial tearing of the common hamstring tendons bilaterally. 4. Mild degenerative changes of both hips and sacroiliac joints. 5. Postsurgical changes in the lower lumbar spine, details dictated separately. Electronically Signed   By: Elsie Perone M.D.   On: 02/22/2024 08:57   MR LUMBAR SPINE W WO CONTRAST Result Date: 02/21/2024 CLINICAL DATA:  Spine surgery/procedure, postop, infection suspected. Status post recent L4-5 fusion and subsequent wound exploration with repair of a CSF leak. EXAM: MRI LUMBAR SPINE WITHOUT AND WITH CONTRAST TECHNIQUE: Multiplanar and multiecho pulse sequences of the lumbar spine were obtained without and with intravenous contrast. CONTRAST:  10mL GADAVIST  GADOBUTROL  1 MMOL/ML IV SOLN COMPARISON:  Lumbar spine MRI 02/15/2024 FINDINGS: Segmentation:  Standard. Alignment:  Unchanged grade 1 anterolisthesis of L4 on L5. Vertebrae: No fracture, suspicious marrow lesion, significant marrow edema, or evidence of discitis. L4-5 posterior and interbody fusion. Conus medullaris and cauda equina: Conus extends to the L1 level and is normal in signal. Distortion of the cauda equina again suggests the presence of subdural fluid collections extending from the operative level superiorly into the lower thoracic spine, however these overall appear improved/smaller than on the prior MRI. Low signal foci  suggestive of gas within these collections on the prior MRI are no longer apparent. There is mild diffuse dural enhancement throughout the lumbar spine as well as enhancement along the surface of the distal thoracic spinal cord and along the cauda equina. Paraspinal and other soft tissues: Postoperative changes in the posterior lumbar soft tissues. A subcutaneous fluid collection containing gas within the midline along the incision has decreased in size and now measures 3.4 x 4.2 x 11.0 cm (AP x transverse x craniocaudal) with mild peripheral enhancement. A dorsal and right-sided epidural fluid collection in the operative bed at L4-5 measures 4.1 x 6.3 x 5.7 cm and is similar in size to the prior MRI with moderate peripheral enhancement and mass effect on the thecal sac. A small amount of gas is present in this collection. Disc levels: At L4-5, the above described fluid collection along with bulging uncovered disc and residual posterior element hypertrophy result in moderate spinal stenosis. Degenerative changes elsewhere are similar to the prior study. IMPRESSION: Postoperative changes from L4-5 fusion as detailed above. Persistent but improved subdural fluid extending from the operative level into the lower thoracic spine. Unchanged dorsal and right-sided epidural fluid collection in the operative bed and decreased size of a midline subcutaneous collection. Mild nonspecific diffuse dural enhancement as well as enhancement along the surface of  the distal spinal cord and conus. While these may reflect residual sterile collections/seromas, infection and abscesses are not excluded. A residual CSF leak can also not be excluded by imaging. Electronically Signed   By: Dasie Hamburg M.D.   On: 02/21/2024 12:48   DG Chest Port 1 View Result Date: 02/20/2024 CLINICAL DATA:  Fever. EXAM: PORTABLE CHEST 1 VIEW COMPARISON:  March 12, 2019. FINDINGS: Stable cardiomediastinal silhouette. Both lungs are clear. The visualized  skeletal structures are unremarkable. IMPRESSION: No active disease. Electronically Signed   By: Lynwood Landy Raddle M.D.   On: 02/20/2024 09:20   MR BRAIN W WO CONTRAST Result Date: 02/19/2024 CLINICAL DATA:  Headache, increasing frequency or severity EXAM: MRI HEAD WITHOUT AND WITH CONTRAST TECHNIQUE: Multiplanar, multiecho pulse sequences of the brain and surrounding structures were obtained without and with intravenous contrast. CONTRAST:  9mL GADAVIST  GADOBUTROL  1 MMOL/ML IV SOLN COMPARISON:  CT head 02/14/2024 FINDINGS: Brain: Multiple small foci of T1 hyperintensity in the suprasellar region and in the right foramen of Luschka and scattered along the posterior aspect of the cerebellum and along the paramidline supratentorial extra-axial spaces, correlating with finding seen on recent CT head. No evidence of brain edema, acute infarct, hydrocephalus, or abnormal enhancement. Vascular: Major arterial flow voids are maintained skull base. Skull and upper cervical spine: Normal marrow signal. Sinuses/Orbits: Clear sinuses.  No acute orbital findings. IMPRESSION: 1. Many small T1 hyperintensities in the infratentorial and supratentorial extra-axial spaces, correlating with findings on recent CT head and compatible with lipid material. 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 2. No evidence of complicating features. Specifically, no brain edema, acute infarct, hydrocephalus, or abnormal enhancement. Electronically Signed   By: Gilmore GORMAN Molt M.D.   On: 02/19/2024 21:03   MR LUMBAR SPINE WO CONTRAST Result Date: 02/15/2024 CLINICAL DATA:  73 year old female with pain status post surgery postoperative day 7. Persistent headache, and noncontrast head CT suggesting lipid migration in the CSF space to the basilar cisterns. Query CSF leak. EXAM: MRI LUMBAR SPINE WITHOUT CONTRAST TECHNIQUE: Multiplanar, multisequence MR imaging of the lumbar spine was performed. No intravenous contrast was administered. COMPARISON:  Preoperative  lumbar MRI 11/20/2023. Intraoperative images 02/08/2024. FINDINGS: Segmentation: Normal on prior lumbar radiographs, the same numbering system used on the June MRI. Alignment: Stable grade 1 anterolisthesis of L4 on L5, stable vertebral height and alignment elsewhere. Vertebrae: New postoperative changes, mild hardware susceptibility artifact at L4-L5. See details below. Normal background bone marrow signal. Maintained vertebral height. Intact visible sacrum. No marrow edema or evidence of acute osseous abnormality. Conus medullaris and cauda equina: Conus extends to the L1 level, although detail is obscured now due to heterogeneous intraspinal fluid collections. These appear to be in the subdural spaces surrounding the thecal sac, dorsal more so than ventral. See series 8, image 1, 7, 13. Involvement seen from the visible lower thoracic spine to the surgery level at L4-L5. And some areas of pronounced signal loss within the collection (series 8, image 18) which might be gas rather than blood (at the L3 vertebral level series 8, image 18). Subsequent compression of the thecal sac, which is also exacerbated by underlying spine degeneration. Paraspinal and other soft tissues: Postoperative changes at L4-L5 including evidence of laminectomy. Posterior and interbody fusion hardware in place. Right erector spinae muscle fairly CSF isointense fluid collection encompassing 31 x 32 x 42 mm (AP by transverse by CC), volume estimated 21 mL). And there appears to be a tenuous connection from that collection into the subcutaneous space (overlying  the L3 spinous process on series 8, image 18). Larger subcutaneous space collection encompasses 37 by 64 x 99 mm (AP by transverse by CC), estimated volume 117 mL. This tracks both cephalad and caudal from the level of surgery. Additional more generalized subcutaneous edema. Negative visible abdominal viscera. Disc levels: From the visible lower thoracic levels to L2-L3 mass effect on  the thecal sac with crowding of the cauda equina nerve roots is primarily due to asymmetric circumferential subdural fluid collection, possibly with some gas. At L2-L3 and L3-L4 there is some superimposed disc bulging and moderate facet and ligament flavum hypertrophy, stable from the preoperative CT. At L4-L5 postoperative changes and architectural distortion with mild hardware susceptibility artifact. Residual broad-based right paracentral disc seen on series 5, image 7. Pre-existing spinal stenosis at this level does appear somewhat improved. Pre-existing moderate right L4 foraminal stenosis, might not be significantly changed. L5-S1: Cauda equina here appears stable and normal. Chronic disc and endplate degeneration asymmetric to the right is stable. IMPRESSION: 1. Recent L4-L5 decompression and fusion. Constellation of imaging findings suggestive of CSF leak: Asymmetric circumferential and heterogeneous subdural space fluid collection tracking from the level of surgery cephalad into the visible lower thoracic spine, compressing the thecal sac in areas. Possible small volume gas also within that collection. Superimposed small volume Right paraspinal muscle fluid collection at the level of posterior hardware - and although postoperative seroma would appear similar - that collection appears to communicate with a larger estimated 117 mL subcutaneous fluid collection in the midline. 2. L5-S1 level appears unchanged from pre operative MRI. And stable underlying lumbar spine degeneration elsewhere. Electronically Signed   By: VEAR Hurst M.D.   On: 02/15/2024 10:53   CT Head Wo Contrast Addendum Date: 02/14/2024 ADDENDUM REPORT: 02/14/2024 13:12 ADDENDUM: Study discussed by telephone with Dr. SCOTT ZACKOWSKI on 02/14/2024 at 1300 hours. Electronically Signed   By: VEAR Hurst M.D.   On: 02/14/2024 13:12   Result Date: 02/14/2024 CLINICAL DATA:  73 year old female with extreme headache. Recent spine surgery. EXAM: CT HEAD  WITHOUT CONTRAST TECHNIQUE: Contiguous axial images were obtained from the base of the skull through the vertex without intravenous contrast. RADIATION DOSE REDUCTION: This exam was performed according to the departmental dose-optimization program which includes automated exposure control, adjustment of the mA and/or kV according to patient size and/or use of iterative reconstruction technique. COMPARISON:  Head CT 03/14/2019. FINDINGS: Brain: Basilar cisterns appear patent, but there is abnormal lipomatous density (-60 Hounsfield units) in the suprasellar and interpeduncular cisterns, new since 2020 (sagittal image 28). Occasional punctate lipomatous density identified elsewhere in the posterior fossa (series 3, images 5, 10, 11). And small CSF density sub tentorial posterior fossa effusion is new (coronal image 24). But no significant brainstem mass effect. Fourth ventricle size within normal limits. No ventriculomegaly. No hyperdense intracranial hemorrhage is identified. No intracranial midline shift or mass effect. Maintained gray-white differentiation. No cortically based acute infarct identified. Vascular: No suspicious intracranial vascular hyperdensity. Skull: Appears intact.  No acute osseous abnormality identified. Sinuses/Orbits: Visualized paranasal sinuses and mastoids are clear. Other: Stable orbit and scalp soft tissues. IMPRESSION: 1. Abnormal intracranial lipid material, new since 2020 and most concentrated in the suprasellar and interpeduncular cisterns, but tiny foci elsewhere in the posterior fossa. Superimposed small new CSF density sub-tentorial posterior fossa effusions also. 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 2. No other complicating features; no significant intracranial mass effect, no ventriculomegaly, no acute intracranial hemorrhage or infarct identified. Electronically Signed: By: VEAR  Shona M.D. On: 02/14/2024 12:59   DG Lumbar Spine 2-3 Views Result Date: 02/08/2024 CLINICAL  DATA:  Elective surgery. EXAM: LUMBAR SPINE - 2-3 VIEW COMPARISON:  Preoperative imaging FINDINGS: Two fluoroscopic spot views of the lumbar spine submitted from the operating room. Posterior rod and pedicle screw fixation with interbody spacer at L4-L5. Fluoroscopy time 30.6 seconds. Dose 33.84 mGy. IMPRESSION: Intraoperative fluoroscopy during lumbar fusion. Electronically Signed   By: Andrea Gasman M.D.   On: 02/08/2024 17:49   DG C-Arm 1-60 Min-No Report Result Date: 02/08/2024 Fluoroscopy was utilized by the requesting physician.  No radiographic interpretation.   DG C-Arm 1-60 Min-No Report Result Date: 02/08/2024 Fluoroscopy was utilized by the requesting physician.  No radiographic interpretation.     Subjective: - no chest pain, shortness of breath, no abdominal pain, nausea or vomiting.   Discharge Exam: BP (!) 177/72 (BP Location: Left Arm)   Pulse 96   Temp 98.4 F (36.9 C) (Oral)   Resp 16   Ht 5' 2 (1.575 m)   Wt 93 kg   SpO2 97%   BMI 37.49 kg/m   General: Pt is alert, awake, not in acute distress Cardiovascular: RRR, S1/S2 +, no rubs, no gallops Respiratory: CTA bilaterally, no wheezing, no rhonchi Abdominal: Soft, NT, ND, bowel sounds + Extremities: no edema, no cyanosis    The results of significant diagnostics from this hospitalization (including imaging, microbiology, ancillary and laboratory) are listed below for reference.     Microbiology: Recent Results (from the past 240 hours)  Culture, blood (Routine X 2) w Reflex to ID Panel     Status: Abnormal   Collection Time: 02/20/24  9:21 AM   Specimen: BLOOD RIGHT HAND  Result Value Ref Range Status   Specimen Description BLOOD RIGHT HAND  Final   Special Requests   Final    BOTTLES DRAWN AEROBIC ONLY Blood Culture adequate volume   Culture  Setup Time   Final    GRAM NEGATIVE RODS AEROBIC BOTTLE ONLY CRITICAL RESULT CALLED TO, READ BACK BY AND VERIFIED WITH: PHARMD J LEDFORD 02/21/2024 @ 0545 BY  AB Performed at [facility], [address] [place], [place] [address]    Culture ENTEROBACTER CLOACAE (A)  Final   Report Status 02/23/2024 FINAL  Final   Organism ID, Bacteria ENTEROBACTER CLOACAE  Final      Susceptibility   Enterobacter cloacae - MIC*    CEFEPIME  <=0.12 SENSITIVE Sensitive     CIPROFLOXACIN <=0.06 SENSITIVE Sensitive     GENTAMICIN  <=1 SENSITIVE Sensitive     MEROPENEM <=0.25 SENSITIVE Sensitive     TRIMETH/SULFA <=20 SENSITIVE Sensitive     PIP/TAZO Value in next row Sensitive ug/mL     <=4 SENSITIVEThis is a modified FDA-approved test that has been validated and its performance characteristics determined by the reporting laboratory.  This laboratory is certified under the Clinical Laboratory Improvement Amendments CLIA as qualified to perform high complexity clinical laboratory testing.    * ENTEROBACTER CLOACAE  Blood Culture ID Panel (Reflexed)     Status: Abnormal   Collection Time: 02/20/24  9:21 AM  Result Value Ref Range Status   Enterococcus faecalis NOT DETECTED NOT DETECTED Final   Enterococcus Faecium NOT DETECTED NOT DETECTED Final   Listeria monocytogenes NOT DETECTED NOT DETECTED Final   Staphylococcus species NOT DETECTED NOT DETECTED Final   Staphylococcus aureus (BCID) NOT DETECTED NOT DETECTED Final   Staphylococcus epidermidis NOT DETECTED NOT DETECTED Final   Staphylococcus  lugdunensis NOT DETECTED NOT DETECTED Final   Streptococcus species NOT DETECTED NOT DETECTED Final   Streptococcus agalactiae NOT DETECTED NOT DETECTED Final   Streptococcus pneumoniae NOT DETECTED NOT DETECTED Final   Streptococcus pyogenes NOT DETECTED NOT DETECTED Final   A.calcoaceticus-baumannii NOT DETECTED NOT DETECTED Final   Bacteroides fragilis NOT DETECTED NOT DETECTED Final   Enterobacterales DETECTED (A) NOT DETECTED Final    Comment: Enterobacterales represent a large order of gram negative bacteria, not a single organism. CRITICAL RESULT CALLED TO,  READ BACK BY AND VERIFIED WITH: PHARMD J LEDFORD 02/21/2024 @ 0545 BY AB    Enterobacter cloacae complex DETECTED (A) NOT DETECTED Final    Comment: CRITICAL RESULT CALLED TO, READ BACK BY AND VERIFIED WITH: PHARMD J LEDFORD 02/21/2024 @ 0545 BY AB    Escherichia coli NOT DETECTED NOT DETECTED Final   Klebsiella aerogenes NOT DETECTED NOT DETECTED Final   Klebsiella oxytoca NOT DETECTED NOT DETECTED Final   Klebsiella pneumoniae NOT DETECTED NOT DETECTED Final   Proteus species NOT DETECTED NOT DETECTED Final   Salmonella species NOT DETECTED NOT DETECTED Final   Serratia marcescens NOT DETECTED NOT DETECTED Final   Haemophilus influenzae NOT DETECTED NOT DETECTED Final   Neisseria meningitidis NOT DETECTED NOT DETECTED Final   Pseudomonas aeruginosa NOT DETECTED NOT DETECTED Final   Stenotrophomonas maltophilia NOT DETECTED NOT DETECTED Final   Candida albicans NOT DETECTED NOT DETECTED Final   Candida auris NOT DETECTED NOT DETECTED Final   Candida glabrata NOT DETECTED NOT DETECTED Final   Candida krusei NOT DETECTED NOT DETECTED Final   Candida parapsilosis NOT DETECTED NOT DETECTED Final   Candida tropicalis NOT DETECTED NOT DETECTED Final   Cryptococcus neoformans/gattii NOT DETECTED NOT DETECTED Final   CTX-M ESBL NOT DETECTED NOT DETECTED Final   Carbapenem resistance IMP NOT DETECTED NOT DETECTED Final   Carbapenem resistance KPC NOT DETECTED NOT DETECTED Final   Carbapenem resistance NDM NOT DETECTED NOT DETECTED Final   Carbapenem resist OXA 48 LIKE NOT DETECTED NOT DETECTED Final   Carbapenem resistance VIM NOT DETECTED NOT DETECTED Final    Comment: Performed at [facility], [address], [place], [place] [address]  Culture, blood (Routine X 2) w Reflex to ID Panel     Status: None (Preliminary result)   Collection Time: 02/20/24  9:22 AM   Specimen: BLOOD RIGHT ARM  Result Value Ref Range Status   Specimen Description BLOOD RIGHT ARM  Final   Special Requests    Final    BOTTLES DRAWN AEROBIC ONLY Blood Culture results may not be optimal due to an inadequate volume of blood received in culture bottles   Culture   Final    NO GROWTH 4 DAYS Performed at [facility], [address] [place], [place] [address]    Report Status PENDING  Incomplete  Gastrointestinal Panel by PCR , Stool     Status: None   Collection Time: 02/22/24  9:08 AM   Specimen: Stool  Result Value Ref Range Status   Campylobacter species NOT DETECTED NOT DETECTED Final   Plesimonas shigelloides NOT DETECTED NOT DETECTED Final   Salmonella species NOT DETECTED NOT DETECTED Final   Yersinia enterocolitica NOT DETECTED NOT DETECTED Final   Vibrio species NOT DETECTED NOT DETECTED Final   Vibrio cholerae NOT DETECTED NOT DETECTED Final   Enteroaggregative E coli (EAEC) NOT DETECTED NOT DETECTED Final   Enteropathogenic E coli (EPEC) NOT DETECTED NOT DETECTED Final  Enterotoxigenic E coli (ETEC) NOT DETECTED NOT DETECTED Final   Shiga like toxin producing E coli (STEC) NOT DETECTED NOT DETECTED Final   Shigella/Enteroinvasive E coli (EIEC) NOT DETECTED NOT DETECTED Final   Cryptosporidium NOT DETECTED NOT DETECTED Final   Cyclospora cayetanensis NOT DETECTED NOT DETECTED Final   Entamoeba histolytica NOT DETECTED NOT DETECTED Final   Giardia lamblia NOT DETECTED NOT DETECTED Final   Adenovirus F40/41 NOT DETECTED NOT DETECTED Final   Astrovirus NOT DETECTED NOT DETECTED Final   Norovirus GI/GII NOT DETECTED NOT DETECTED Final   Rotavirus A NOT DETECTED NOT DETECTED Final   Sapovirus (I, II, IV, and V) NOT DETECTED NOT DETECTED Final    Comment: Performed at [facility], [address] [place] , [place] [address]     Labs: Basic Metabolic Panel: Recent Labs  Lab 02/20/24 0512 02/21/24 0424 02/22/24 0227 02/23/24 0218 02/24/24 0315  NA 138 138 138 142 137  K 4.5 4.0 3.6 4.4 3.9  CL 106 104 106 106 105  CO2 22 22 22  21* 23  GLUCOSE 111* 128* 153* 95  128*  BUN 18 21 22 16 17   CREATININE 0.89 1.02* 0.92 0.82 0.88  CALCIUM  8.8* 8.6* 8.0* 8.6* 8.6*   Liver Function Tests: Recent Labs  Lab 02/21/24 1258 02/22/24 0227 02/23/24 0218 02/24/24 0315  AST 29 36 64* 76*  ALT 24 28 47* 62*  ALKPHOS 50 45 45 51  BILITOT 0.4 0.5 0.5 0.5  PROT 5.8* 4.6* 4.9* 5.3*  ALBUMIN  2.6* 2.1* 2.1* 2.2*   CBC: Recent Labs  Lab 02/20/24 0910 02/21/24 0424 02/22/24 0227 02/23/24 0218 02/24/24 0315  WBC 12.9* 15.7* 13.8* 11.4* 11.4*  HGB 10.6* 11.7* 9.3* 9.9* 9.8*  HCT 30.9* 36.1 27.9* 30.2* 29.4*  MCV 91.4 96.8 93.6 94.4 92.7  PLT 270 315 318 366 422*   CBG: Recent Labs  Lab 02/22/24 2104 02/23/24 0838 02/23/24 1150 02/23/24 1816 02/24/24 0811  GLUCAP 120* 121* 89 110* 122*   Hgb A1c No results for input(s): HGBA1C in the last 72 hours. Lipid Profile No results for input(s): CHOL, HDL, LDLCALC, TRIG, CHOLHDL, LDLDIRECT in the last 72 hours. Thyroid  function studies No results for input(s): TSH, T4TOTAL, T3FREE, THYROIDAB in the last 72 hours.  Invalid input(s): FREET3 Urinalysis    Component Value Date/Time   COLORURINE YELLOW 02/19/2024 0805   APPEARANCEUR HAZY (A) 02/19/2024 0805   LABSPEC 1.020 02/19/2024 0805   PHURINE 5.0 02/19/2024 0805   GLUCOSEU 50 (A) 02/19/2024 0805   HGBUR NEGATIVE 02/19/2024 0805   BILIRUBINUR NEGATIVE 02/19/2024 0805   KETONESUR NEGATIVE 02/19/2024 0805   PROTEINUR NEGATIVE 02/19/2024 0805   NITRITE NEGATIVE 02/19/2024 0805   LEUKOCYTESUR NEGATIVE 02/19/2024 0805    FURTHER DISCHARGE INSTRUCTIONS:   Get Medicines reviewed and adjusted: Please take all your medications with you for your next visit with your Primary MD   Laboratory/radiological data: Please request your Primary MD to go over all hospital tests and procedure/radiological results at the follow up, please ask your Primary MD to get all Hospital records sent to his/her office.   In some cases, they will  be blood work, cultures and biopsy results pending at the time of your discharge. Please request that your primary care M.D. goes through all the records of your hospital data and follows up on these results.   Also Note the following: If you experience worsening of your admission symptoms, develop shortness of breath, life threatening emergency, suicidal or homicidal thoughts  you must seek medical attention immediately by calling 911 or calling your MD immediately  if symptoms less severe.   You must read complete instructions/literature along with all the possible adverse reactions/side effects for all the Medicines you take and that have been prescribed to you. Take any new Medicines after you have completely understood and accpet all the possible adverse reactions/side effects.    Do not drive when taking Pain medications or sleeping medications (Benzodaizepines)   Do not take more than prescribed Pain, Sleep and Anxiety Medications. It is not advisable to combine anxiety,sleep and pain medications without talking with your primary care practitioner   Special Instructions: If you have smoked or chewed Tobacco  in the last 2 yrs please stop smoking, stop any regular Alcohol   and or any Recreational drug use.   Wear Seat belts while driving.   Please note: You were cared for by a hospitalist during your hospital stay. Once you are discharged, your primary care physician will handle any further medical issues. Please note that NO REFILLS for any discharge medications will be authorized once you are discharged, as it is imperative that you return to your primary care physician (or establish a relationship with a primary care physician if you do not have one) for your post hospital discharge needs so that they can reassess your need for medications and monitor your lab values.  Time coordinating discharge: 35 minutes  SIGNED:  Nilda Fendt, MD, PhD 02/24/2024, 11:20 AM

## 2024-02-24 NOTE — Progress Notes (Deleted)
 PROGRESS NOTE  Diane Watkins FMW:[id number] DOB: May 26, 1951 DOA: 02/14/2024 PCP: Bluford Jacqulyn MATSU, DO   LOS: 9 days   Brief Narrative / Interim history: Diane Watkins is a 73 y.o. year old female with PMH of  HTN, HLD, DM2, GERD, and a recent transforaminal lumbar decompression and fusion at L4-5 on 02/08/2024 with discharge from the hospital on 02/10/2024. She reported a headache at the time of her discharge but clinical symptoms were felt to most likely represent a tension headache. She returned to the ER 8/31 with persistent unrelenting severe generalized headache.  She had stopped taking her prescribed narcotic pain medication due to itching.  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 Patient admitted for persistent headache, orthospine surgery consulted. Patient completed MRI showing persistent leak. 9/3 after second opinion by Dr. Joshua underwent exploration of the lumbar wound with closure of dural defect.  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  She initially failed to improve with conservative rx >after second opinion by Dr. Joshua s/p exploration lumbar wound with closure of dural defect 9/3 - Dilaudid  was discontinued due to concern for altered mental status,on toradol  tramadol  and Tylenol  as needed - Given patient's fever and bacteremia underwent MRI lumbar spine and right hip with and without contrast 9/7-reported  as below, follow-up orthospine-do not suspect postop infection, imaging without evidence of septic arthritis or osteomyelitis in the pelvis or the right hip  Active problems  Enterobacter cloacae bacteremia 9/6 Phlebitis of the left hand Rectosigmoid colitis per MRI -Patient has been having fevers, found to be bacteremic.  ID consulted, recommending 8 weeks of IV antibiotics.  PICC line placed 9/9.  There was concern for rectosigmoid colitis based on MRI, repeat CT scan of the abdomen pelvis 9/9 without acute findings in the abdomen or pelvis   Acute metabolic encephalopathy-resolved. 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 TSH 4.7, ammonia normal, b12 low-being replaced. Some right hand tremor previously-cont to monitornt.   Postoperative anemia- possible ABLA Anemia of chronic illness -hemoglobin overall stable, monitor periodically  HTN - Stable,on lisinopril .   HLD - Continue home atorvastatin  and Lovaza .   Constipation - Having bowel movement - now continue stool softener    Class II Obesity - body mass index is 37.49 kg/m  Scheduled Meds:  Chlorhexidine  Gluconate Cloth  6 each Topical Daily   vitamin B-12  1,000 mcg Oral Daily   docusate sodium   100 mg Oral BID   enoxaparin  (LOVENOX ) injection  40 mg Subcutaneous Q24H   insulin  aspart  0-15 Units Subcutaneous TID WC   lisinopril   20 mg Oral QPM   loratadine   10 mg Oral Daily   neomycin -bacitracin -polymyxin   Topical BID   omega-3 acid ethyl esters  2 capsule Oral BID   rosuvastatin   40 mg Oral QPM   senna-docusate  1 tablet Oral BID  sodium chloride  flush  3 mL Intravenous Q12H   Continuous Infusions:  ceFEPime  (MAXIPIME ) IV 2 g (02/24/24 1010)   PRN Meds:.acetaminophen , acetaminophen  **OR** [DISCONTINUED] acetaminophen , ALPRAZolam , butalbital -acetaminophen -caffeine ,  cyclobenzaprine , diphenhydrAMINE , HYDROcodone -acetaminophen , hydrocortisone , ondansetron  (ZOFRAN ) IV, polyethylene glycol, senna, sodium chloride  flush, sodium chloride  flush, sodium phosphate , traMADol   Current Outpatient Medications  Medication Instructions   acetaminophen  (TYLENOL ) 650 mg, Oral, Every 6 hours PRN   cetirizine (ZYRTEC) 10 mg, Oral, Daily PRN   cyclobenzaprine  (FLEXERIL ) 10 mg, Oral, 3 times daily PRN   ibuprofen (ADVIL) 600 mg, Oral, Daily PRN   lisinopril  (ZESTRIL ) 20 mg, Oral, Daily   omega-3 acid ethyl esters (LOVAZA ) 2 g, Oral, 2 times daily   Polyethyl Glycol-Propyl Glycol 0.4-0.3 % SOLN 1-2 drops, Both Eyes, 3 times daily PRN   rosuvastatin  (CRESTOR ) 40 mg, Oral, Daily    Diet Orders (From admission, onward)     Start     Ordered   02/17/24 2137  Diet regular Room service appropriate? Yes; Fluid consistency: Thin  Diet effective now       Question Answer Comment  Room service appropriate? Yes   Fluid consistency: Thin      02/17/24 2136            DVT prophylaxis: enoxaparin  (LOVENOX ) injection 40 mg Start: 02/22/24 1245 SCD's Start: 02/17/24 2112 SCDs Start: 02/14/24 1503   Lab Results  Component Value Date   PLT 422 (H) 02/24/2024      Code Status: Full Code  Family Communication: No family at bedside  Status is: Inpatient Remains inpatient appropriate because: Awaiting rehab   Level of care: Med-Surg  Consultants:  Neurosurgery Orthopedics ID Neurology  Objective: Vitals:   02/23/24 1834 02/24/24 0001 02/24/24 0330 02/24/24 0724  BP: 124/68 (!) 161/73 (!) 152/69 (!) 177/72  Pulse:  90 82 96  Resp:  17 18 16   Temp:  97.8 F (36.6 C) 98.4 F (36.9 C) 98.4 F (36.9 C)  TempSrc:  Oral Axillary Oral  SpO2:  97% 97% 97%  Weight:      Height:        Intake/Output Summary (Last 24 hours) at 02/24/2024 1048 Last data filed at 02/24/2024 1024 Gross per 24 hour  Intake 200 ml  Output 450 ml  Net -250 ml   Wt Readings from  Last 3 Encounters:  02/14/24 93 kg  02/03/24 93 kg  12/28/23 92.5 kg    Examination:  Constitutional: NAD Eyes: no scleral icterus ENMT: Mucous membranes are moist.  Neck: normal, supple Respiratory: clear to auscultation bilaterally, no wheezing, no crackles. Cardiovascular: Regular rate and rhythm, no murmurs / rubs / gallops. No LE edema.  Abdomen: non distended, no tenderness. Bowel sounds positive.  Musculoskeletal: no clubbing / cyanosis.    Data Reviewed: I have independently reviewed following labs and imaging studies   CBC Recent Labs  Lab 02/20/24 0910 02/21/24 0424 02/22/24 0227 02/23/24 0218 02/24/24 0315  WBC 12.9* 15.7* 13.8* 11.4* 11.4*  HGB 10.6* 11.7* 9.3* 9.9* 9.8*  HCT 30.9* 36.1 27.9* 30.2* 29.4*  PLT 270 315 318 366 422*  MCV 91.4 96.8 93.6 94.4 92.7  MCH 31.4 31.4 31.2 30.9 30.9  MCHC 34.3 32.4 33.3 32.8 33.3  RDW 13.2 13.4 13.5 13.6 13.7    Recent Labs  Lab 02/19/24 0947 02/20/24 0512 02/21/24 0424 02/21/24 1258 02/22/24 0227 02/23/24 0218 02/24/24 0315  NA  --  138 138  --  138 142 137  K  --  4.5 4.0  --  3.6 4.4 3.9  CL  --  106 104  --  106 106 105  CO2  --  22 22  --  22 21* 23  GLUCOSE  --  111* 128*  --  153* 95 128*  BUN  --  18 21  --  22 16 17   CREATININE  --  0.89 1.02*  --  0.92 0.82 0.88  CALCIUM   --  8.8* 8.6*  --  8.0* 8.6* 8.6*  AST  --   --   --  29 36 64* 76*  ALT  --   --   --  24 28 47* 62*  ALKPHOS  --   --   --  50 45 45 51  BILITOT  --   --   --  0.4 0.5 0.5 0.5  ALBUMIN   --   --   --  2.6* 2.1* 2.1* 2.2*  PROCALCITON  --  0.13  --   --   --   --   --   TSH 4.775*  --   --   --   --   --   --   AMMONIA 32  --   --   --   --   --   --     ------------------------------------------------------------------------------------------------------------------ No results for input(s): CHOL, HDL, LDLCALC, TRIG, CHOLHDL, LDLDIRECT in the last 72 hours.  Lab Results  Component Value Date   HGBA1C 7.0  (H) 02/03/2024   ------------------------------------------------------------------------------------------------------------------ No results for input(s): TSH, T4TOTAL, T3FREE, THYROIDAB in the last 72 hours.  Invalid input(s): FREET3  Cardiac Enzymes No results for input(s): CKMB, TROPONINI, MYOGLOBIN in the last 168 hours.  Invalid input(s): CK ------------------------------------------------------------------------------------------------------------------    Component Value Date/Time   BNP 10.0 03/13/2019 0019    CBG: Recent Labs  Lab 02/22/24 2104 02/23/24 0838 02/23/24 1150 02/23/24 1816 02/24/24 0811  GLUCAP 120* 121* 89 110* 122*    Recent Results (from the past 240 hours)  Culture, blood (Routine X 2) w Reflex to ID Panel     Status: Abnormal   Collection Time: 02/20/24  9:21 AM   Specimen: BLOOD RIGHT HAND  Result Value Ref Range Status   Specimen Description BLOOD RIGHT HAND  Final   Special Requests   Final    BOTTLES DRAWN AEROBIC ONLY Blood Culture adequate volume   Culture  Setup Time   Final    GRAM NEGATIVE RODS AEROBIC BOTTLE ONLY CRITICAL RESULT CALLED TO, READ BACK BY AND VERIFIED WITH: PHARMD J LEDFORD 02/21/2024 @ 0545 BY AB Performed at [facility], [address] [place], [place] [address]    Culture ENTEROBACTER CLOACAE (A)  Final   Report Status 02/23/2024 FINAL  Final   Organism ID, Bacteria ENTEROBACTER CLOACAE  Final      Susceptibility   Enterobacter cloacae - MIC*    CEFEPIME  <=0.12 SENSITIVE Sensitive     CIPROFLOXACIN <=0.06 SENSITIVE Sensitive     GENTAMICIN  <=1 SENSITIVE Sensitive     MEROPENEM <=0.25 SENSITIVE Sensitive     TRIMETH/SULFA <=20 SENSITIVE Sensitive     PIP/TAZO Value in next row Sensitive ug/mL     <=4 SENSITIVEThis is a modified FDA-approved test that has been validated and its performance characteristics determined by the reporting laboratory.  This laboratory is certified under the  Clinical Laboratory Improvement Amendments CLIA as qualified to perform high complexity clinical laboratory testing.    * ENTEROBACTER CLOACAE  Blood Culture ID Panel (Reflexed)  Status: Abnormal   Collection Time: 02/20/24  9:21 AM  Result Value Ref Range Status   Enterococcus faecalis NOT DETECTED NOT DETECTED Final   Enterococcus Faecium NOT DETECTED NOT DETECTED Final   Listeria monocytogenes NOT DETECTED NOT DETECTED Final   Staphylococcus species NOT DETECTED NOT DETECTED Final   Staphylococcus aureus (BCID) NOT DETECTED NOT DETECTED Final   Staphylococcus epidermidis NOT DETECTED NOT DETECTED Final   Staphylococcus lugdunensis NOT DETECTED NOT DETECTED Final   Streptococcus species NOT DETECTED NOT DETECTED Final   Streptococcus agalactiae NOT DETECTED NOT DETECTED Final   Streptococcus pneumoniae NOT DETECTED NOT DETECTED Final   Streptococcus pyogenes NOT DETECTED NOT DETECTED Final   A.calcoaceticus-baumannii NOT DETECTED NOT DETECTED Final   Bacteroides fragilis NOT DETECTED NOT DETECTED Final   Enterobacterales DETECTED (A) NOT DETECTED Final    Comment: Enterobacterales represent a large order of gram negative bacteria, not a single organism. CRITICAL RESULT CALLED TO, READ BACK BY AND VERIFIED WITH: PHARMD J LEDFORD 02/21/2024 @ 0545 BY AB    Enterobacter cloacae complex DETECTED (A) NOT DETECTED Final    Comment: CRITICAL RESULT CALLED TO, READ BACK BY AND VERIFIED WITH: PHARMD J LEDFORD 02/21/2024 @ 0545 BY AB    Escherichia coli NOT DETECTED NOT DETECTED Final   Klebsiella aerogenes NOT DETECTED NOT DETECTED Final   Klebsiella oxytoca NOT DETECTED NOT DETECTED Final   Klebsiella pneumoniae NOT DETECTED NOT DETECTED Final   Proteus species NOT DETECTED NOT DETECTED Final   Salmonella species NOT DETECTED NOT DETECTED Final   Serratia marcescens NOT DETECTED NOT DETECTED Final   Haemophilus influenzae NOT DETECTED NOT DETECTED Final   Neisseria meningitidis NOT  DETECTED NOT DETECTED Final   Pseudomonas aeruginosa NOT DETECTED NOT DETECTED Final   Stenotrophomonas maltophilia NOT DETECTED NOT DETECTED Final   Candida albicans NOT DETECTED NOT DETECTED Final   Candida auris NOT DETECTED NOT DETECTED Final   Candida glabrata NOT DETECTED NOT DETECTED Final   Candida krusei NOT DETECTED NOT DETECTED Final   Candida parapsilosis NOT DETECTED NOT DETECTED Final   Candida tropicalis NOT DETECTED NOT DETECTED Final   Cryptococcus neoformans/gattii NOT DETECTED NOT DETECTED Final   CTX-M ESBL NOT DETECTED NOT DETECTED Final   Carbapenem resistance IMP NOT DETECTED NOT DETECTED Final   Carbapenem resistance KPC NOT DETECTED NOT DETECTED Final   Carbapenem resistance NDM NOT DETECTED NOT DETECTED Final   Carbapenem resist OXA 48 LIKE NOT DETECTED NOT DETECTED Final   Carbapenem resistance VIM NOT DETECTED NOT DETECTED Final    Comment: Performed at [facility], [address], [place], [place] [address]  Culture, blood (Routine X 2) w Reflex to ID Panel     Status: None (Preliminary result)   Collection Time: 02/20/24  9:22 AM   Specimen: BLOOD RIGHT ARM  Result Value Ref Range Status   Specimen Description BLOOD RIGHT ARM  Final   Special Requests   Final    BOTTLES DRAWN AEROBIC ONLY Blood Culture results may not be optimal due to an inadequate volume of blood received in culture bottles   Culture   Final    NO GROWTH 4 DAYS Performed at [facility], [address], [place], [place] [address]    Report Status PENDING  Incomplete  Gastrointestinal Panel by PCR , Stool     Status: None   Collection Time: 02/22/24  9:08 AM   Specimen: Stool  Result Value Ref Range Status   Campylobacter species  NOT DETECTED NOT DETECTED Final   Plesimonas shigelloides NOT DETECTED NOT DETECTED Final   Salmonella species NOT DETECTED NOT DETECTED Final   Yersinia enterocolitica NOT DETECTED NOT DETECTED Final   Vibrio species NOT DETECTED NOT DETECTED  Final   Vibrio cholerae NOT DETECTED NOT DETECTED Final   Enteroaggregative E coli (EAEC) NOT DETECTED NOT DETECTED Final   Enteropathogenic E coli (EPEC) NOT DETECTED NOT DETECTED Final   Enterotoxigenic E coli (ETEC) NOT DETECTED NOT DETECTED Final   Shiga like toxin producing E coli (STEC) NOT DETECTED NOT DETECTED Final   Shigella/Enteroinvasive E coli (EIEC) NOT DETECTED NOT DETECTED Final   Cryptosporidium NOT DETECTED NOT DETECTED Final   Cyclospora cayetanensis NOT DETECTED NOT DETECTED Final   Entamoeba histolytica NOT DETECTED NOT DETECTED Final   Giardia lamblia NOT DETECTED NOT DETECTED Final   Adenovirus F40/41 NOT DETECTED NOT DETECTED Final   Astrovirus NOT DETECTED NOT DETECTED Final   Norovirus GI/GII NOT DETECTED NOT DETECTED Final   Rotavirus A NOT DETECTED NOT DETECTED Final   Sapovirus (I, II, IV, and V) NOT DETECTED NOT DETECTED Final    Comment: Performed at [facility], [address], [place], [place] [address]     Radiology Studies: CT ABDOMEN PELVIS WO CONTRAST Result Date: 02/23/2024 CLINICAL DATA:  Abdominal pain, postop EXAM: CT ABDOMEN AND PELVIS WITHOUT CONTRAST TECHNIQUE: Multidetector CT imaging of the abdomen and pelvis was performed following the standard protocol without IV contrast. RADIATION DOSE REDUCTION: This exam was performed according to the departmental dose-optimization program which includes automated exposure control, adjustment of the mA and/or kV according to patient size and/or use of iterative reconstruction technique. COMPARISON:  09/27/2020 FINDINGS: Lower chest: No acute findings Hepatobiliary: No focal liver abnormality is seen. Status post cholecystectomy. No biliary dilatation. Pancreas: No focal abnormality or ductal dilatation. Spleen: No focal abnormality.  Normal size. Adrenals/Urinary Tract: No adrenal abnormality. No focal renal abnormality. No stones or hydronephrosis. Urinary bladder is unremarkable. Stomach/Bowel:  Sigmoid diverticulosis. No active diverticulitis. Stomach and small bowel decompressed, unremarkable. Vascular/Lymphatic: Aortic atherosclerosis. No evidence of aneurysm or adenopathy. Reproductive: Uterus and adnexa unremarkable.  No mass. Other: No free fluid or free air. Small umbilical hernia containing fat. Evidence of prior repair of right supraumbilical ventral hernia. There appears to be a small recurrent hernia containing fat in this region. Musculoskeletal: No acute bony abnormality. IMPRESSION: No acute findings in the abdomen or pelvis. Aortic atherosclerosis. Small umbilical hernia containing fat. Prior repair of fat containing right supraumbilical ventral hernia with small hernia recurrence containing fat. Electronically Signed   By: Franky Crease M.D.   On: 02/23/2024 23:44   US  EKG SITE RITE Result Date: 02/23/2024 If Site Rite image not attached, placement could not be confirmed due to current cardiac rhythm.    Nilda Fendt, MD, PhD [facility]  Between 7 am - 7 pm I am available, please contact me via Amion (for emergencies) or Securechat (non urgent messages)  Between 7 pm - 7 am I am not available, please contact night coverage MD/APP via Amion

## 2024-02-24 NOTE — Progress Notes (Signed)
 Inpatient Rehab Admissions Coordinator:   Continue to await determination from [facility].  Case opened for review on Monday AM.    Reche Lowers, PT, DPT Admissions Coordinator [phone] 02/24/24  9:16 AM

## 2024-02-24 NOTE — PMR Pre-admission (Signed)
 PMR Admission Coordinator Pre-Admission Assessment  Patient: Diane Watkins is an 73 y.o., female MRN: [id number] DOB: Oct 20, 1950 Height: 5' 2 (157.5 cm) Weight: 93 kg  Insurance Information HMO:     PPO: yes     PCP:      IPA:      80/20:      OTHER:  PRIMARY: [employer]      Policy#: [id number]      Subscriber: pt CM Name: Jori      Phone#: [phone]     Fax#: epic access Pre-Cert#: [id number] auth for CIR from [facility] with HTA for admit 9/10 through 9/16.  Updates due weekly and they have epic access.       Employer:  Benefits:  Phone #: [phone]     Name:  Eff. Date: 06/17/23     Deduct: $0      Out of Pocket Max: $$3400 (met $341.87)      Life Max: n/a CIR: $325/day for days 1-6      SNF: 20 full days Outpatient:      Co-Pay: $15/visit Home Health: 100%      Co-Pay:  DME: 75%     Co-Pay: 25% Providers:  SECONDARY: [facility]       Policy#:      Phone#:   Artist:       Phone#:   The Data processing manager" for patients in Inpatient Rehabilitation Facilities with attached "Privacy Act Statement-Health Care Records" was provided and verbally reviewed with: Patient and Family  Emergency Contact Information Contact Information     Name Relation Home Work [place]  [phone]  [phone]   Bernhart,GLENN Spouse [phone]  [phone]   courts,stephanie Daughter [phone]  [phone]   FRANCHOT CLUNES Sister [phone]        Other Contacts   None on File     Current Medical History  Patient Admitting Diagnosis: CSF leak, lipoid meningitis  History of Present Illness: Pt is a 73 y/o female with PMH of HTN, HLD, DM, GERD, and a recent transforaminal lumbar decompression and fusion at L4-5 on 8/25 per Dr. Reyne and discharged home.  She represented to [facility] on 8/31 with persistent headache.  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  MRI showed CSF leak.  She underwent re-exploration of her lumbar  wound per Dr. Joshua on 9/3 with closure of dural defect.  Pt with persistent fever in the acute setting and cultures revealed enterobacter cloacae bacteremia.  She also has Phlebitis of the left hand at her IV site which was removed.  Consults to ID and pt will do 8 weeks of IV abx with EOT date 11/1.  She has a PICC line.  Therapy ongoing and pt was recommended for CIR.      Patient's medical record from [facility] has been reviewed by the rehabilitation admission coordinator and physician.  Past Medical History  Past Medical History:  Diagnosis Date   Arthritis    Diabetes mellitus without complication (HCC)    High cholesterol    History of hiatal hernia    Hyperlipidemia    Hypertension     Has the patient had major surgery during 100 days prior to admission? Yes  Family History   family history includes Alcohol  abuse in her father; Heart disease (age of onset: 66) in her father; Stroke in her mother.  Current Medications  Current Facility-Administered Medications:    acetaminophen  (TYLENOL ) suppository 650 mg, 650 mg, Rectal, Q6H  PRN, Christobal Guadalajara, MD   acetaminophen  (TYLENOL ) tablet 650 mg, 650 mg, Oral, Q6H PRN, 650 mg at 02/23/24 1624 **OR** [DISCONTINUED] acetaminophen  (TYLENOL ) suppository 650 mg, 650 mg, Rectal, Q6H PRN, Melvin, Alexander B, MD   ALPRAZolam  (XANAX ) tablet 0.25 mg, 0.25 mg, Oral, TID PRN, Kc, Ramesh, MD, 0.25 mg at 02/23/24 1616   butalbital -acetaminophen -caffeine  (FIORICET ) 50-325-40 MG per tablet 2 tablet, 2 tablet, Oral, Q6H PRN, Reyne Cordella SQUIBB, MD, 2 tablet at 02/24/24 0035   ceFEPIme  (MAXIPIME ) 2 g in sodium chloride  0.9 % 100 mL IVPB, 2 g, Intravenous, Q8H, Sinclair, Emily S, RPH, Last Rate: 200 mL/hr at 02/24/24 1010, 2 g at 02/24/24 1010   Chlorhexidine  Gluconate Cloth 2 % PADS 6 each, 6 each, Topical, Daily, Kc, Ramesh, MD, 6 each at 02/24/24 1020   cyanocobalamin  (VITAMIN B12) tablet 1,000 mcg, 1,000 mcg, Oral, Daily, Kc, Ramesh, MD, 1,000 mcg at  02/24/24 1018   cyclobenzaprine  (FLEXERIL ) tablet 10 mg, 10 mg, Oral, TID PRN, Melvin, Alexander B, MD, 10 mg at 02/21/24 1701   diphenhydrAMINE  (BENADRYL ) capsule 25 mg, 25 mg, Oral, Q6H PRN, Reyne Cordella SQUIBB, MD, 25 mg at 02/22/24 0041   docusate sodium  (COLACE) capsule 100 mg, 100 mg, Oral, BID, Reyne Cordella SQUIBB, MD, 100 mg at 02/24/24 1018   enoxaparin  (LOVENOX ) injection 40 mg, 40 mg, Subcutaneous, Q24H, Kc, Ramesh, MD, 40 mg at 02/23/24 1240   HYDROcodone -acetaminophen  (NORCO/VICODIN) 5-325 MG per tablet 1 tablet, 1 tablet, Oral, Q6H PRN, Reyne Cordella SQUIBB, MD, 1 tablet at 02/21/24 0345   hydrocortisone  1 % lotion, , Topical, PRN, Kc, Ramesh, MD   insulin  aspart (novoLOG ) injection 0-15 Units, 0-15 Units, Subcutaneous, TID WC, Melvin, Alexander B, MD, 2 Units at 02/24/24 9176   lisinopril  (ZESTRIL ) tablet 20 mg, 20 mg, Oral, QPM, Melvin, Alexander B, MD, 20 mg at 02/23/24 1834   loratadine  (CLARITIN ) tablet 10 mg, 10 mg, Oral, Daily, Danton Purchase T, MD, 10 mg at 02/24/24 1018   neomycin -bacitracin -polymyxin (NEOSPORIN) ointment, , Topical, BID, Kc, Ramesh, MD, Given at 02/24/24 1019   omega-3 acid ethyl esters (LOVAZA ) capsule 2 g, 2 capsule, Oral, BID, Melvin, Alexander B, MD, 2 g at 02/24/24 1018   ondansetron  (ZOFRAN ) injection 4 mg, 4 mg, Intravenous, Q6H PRN, Danton Purchase DASEN, MD, 4 mg at 02/24/24 9178   polyethylene glycol (MIRALAX  / GLYCOLAX ) packet 17 g, 17 g, Oral, Daily PRN, Melvin, Alexander B, MD, 17 g at 02/19/24 1734   rosuvastatin  (CRESTOR ) tablet 40 mg, 40 mg, Oral, QPM, Melvin, Alexander B, MD, 40 mg at 02/23/24 1834   senna (SENOKOT) tablet 8.6 mg, 1 tablet, Oral, Daily PRN, Reyne Cordella SQUIBB, MD, 8.6 mg at 02/19/24 9072   senna-docusate (Senokot-S) tablet 1 tablet, 1 tablet, Oral, BID, Kc, Ramesh, MD, 1 tablet at 02/24/24 1018   sodium chloride  flush (NS) 0.9 % injection 10-40 mL, 10-40 mL, Intracatheter, PRN, Kc, Ramesh, MD   sodium chloride  flush (NS) 0.9 %  injection 3 mL, 3 mL, Intravenous, Q12H, Reyne Cordella SQUIBB, MD, 3 mL at 02/23/24 0902   sodium chloride  flush (NS) 0.9 % injection 3 mL, 3 mL, Intravenous, PRN, Reyne Cordella SQUIBB, MD, 3 mL at 02/17/24 2137   sodium phosphate  (FLEET) enema 1 enema, 1 enema, Rectal, Daily PRN, Reyne Cordella SQUIBB, MD, 1 enema at 02/19/24 1625   traMADol  (ULTRAM ) tablet 50 mg, 50 mg, Oral, Q6H PRN, Reyne Cordella SQUIBB, MD  Patients Current Diet:  Diet Order  Diet regular Room service appropriate? Yes; Fluid consistency: Thin  Diet effective now                   Precautions / Restrictions Precautions Precautions: Back, Fall Precaution Booklet Issued: No Precaution/Restrictions Comments: verbally reviewed back prec Other Brace: no brace needed per orders Restrictions Weight Bearing Restrictions Per Provider Order: No   Has the patient had 2 or more falls or a fall with injury in the past year? No  Prior Activity Level Community (5-7x/wk): independent prior to surgery but with increased time and occasional use of DME  Prior Functional Level Self Care: Did the patient need help bathing, dressing, using the toilet or eating? Independent  Indoor Mobility: Did the patient need assistance with walking from room to room (with or without device)? Independent  Stairs: Did the patient need assistance with internal or external stairs (with or without device)? Independent  Functional Cognition: Did the patient need help planning regular tasks such as shopping or remembering to take medications? Independent  Patient Information Are you of Hispanic, Latino/a,or Spanish origin?: A. No, not of Hispanic, Latino/a, or Spanish origin What is your race?: A. White Do you need or want an interpreter to communicate with a doctor or health care staff?: 0. No  Patient's Response To:  Health Literacy and Transportation Is the patient able to respond to health literacy and transportation needs?:  Yes Health Literacy - How often do you need to have someone help you when you read instructions, pamphlets, or other written material from your doctor or pharmacy?: Never In the past 12 months, has lack of transportation kept you from medical appointments or from getting medications?: No In the past 12 months, has lack of transportation kept you from meetings, work, or from getting things needed for daily living?: No  Journalist, newspaper / Equipment Home Equipment: Toilet riser, Rollator (4 wheels), Cane - single point, Tub bench  Prior Device Use: Indicate devices/aids used by the patient prior to current illness, exacerbation or injury? Walker  Current Functional Level Cognition  Orientation Level: Oriented X4    Extremity Assessment (includes Sensation/Coordination)  Upper Extremity Assessment: Generalized weakness (mild RUE tremoring)  Lower Extremity Assessment: Defer to PT evaluation    ADLs  Overall ADL's : Needs assistance/impaired Eating/Feeding: Minimal assistance, Sitting Grooming: Wash/dry face, Oral care, Applying deodorant, Brushing hair, Standing, Supervision/safety Upper Body Bathing: Moderate assistance, Sitting Lower Body Bathing: Moderate assistance, Sitting/lateral leans Upper Body Dressing : Minimal assistance, Standing Lower Body Dressing: Moderate assistance Toilet Transfer: Supervision/safety, Ambulation, Rolling walker (2 wheels), Regular Toilet Toileting- Clothing Manipulation and Hygiene: Moderate assistance Toileting - Clothing Manipulation Details (indicate cue type and reason): for posterior pericare Functional mobility during ADLs: Supervision/safety, Rolling walker (2 wheels)    Mobility  Overal bed mobility: Needs Assistance Bed Mobility: Sidelying to Sit, Rolling Rolling: Min assist Sidelying to sit: Mod assist Sit to sidelying: Mod assist, +2 for physical assistance General bed mobility comments: OOB on BSC upon arrival, in chair at  departure    Transfers  Overall transfer level: Needs assistance Equipment used: Rolling walker (2 wheels) Transfers: Sit to/from Stand, Bed to chair/wheelchair/BSC Sit to Stand: Supervision Bed to/from chair/wheelchair/BSC transfer type:: Step pivot Stand pivot transfers: Mod assist, +2 physical assistance, Min assist Step pivot transfers: Min assist General transfer comment: cues for hand placement, slow to rise. light assist to steady on rise and during step pivot    Ambulation / Gait / Stairs / Psychologist, prison and probation services  Ambulation/Gait Ambulation/Gait assistance: Contact guard assist, +2 safety/equipment, Min assist Gait Distance (Feet): 125 Feet Assistive device: Rolling walker (2 wheels) Gait Pattern/deviations: Step-through pattern, Decreased stride length General Gait Details: up to min A to maintain balance, cues for closer RW proximity and upright posture, distance limited due to fatigue and headache Gait velocity: decreased Gait velocity interpretation: <1.31 ft/sec, indicative of household ambulator    Posture / Balance Balance Overall balance assessment: Needs assistance Sitting-balance support: Feet supported Sitting balance-Leahy Scale: Good Standing balance support: Bilateral upper extremity supported Standing balance-Leahy Scale: Fair Standing balance comment: static standing at sink without AD    Special considerations/life events  Continuous Drip IV  maxipime , Skin surgical incision to lower back, phlebitis LUE, and Diabetic management yes   Previous Home Environment (from acute therapy documentation) Living Arrangements: Spouse/significant other Available Help at Discharge: Family Type of Home: House Home Layout: One level, Other (Comment) Home Access: Stairs to enter Entrance Stairs-Rails: Right, Left Entrance Stairs-Number of Steps: 4 Bathroom Shower/Tub: Tub/shower unit Home Care Services: No  Discharge Living Setting Plans for Discharge Living Setting:  Patient's home, Lives with (comment) (spouse) Type of Home at Discharge: House Discharge Home Layout: One level Discharge Home Access: Stairs to enter Entrance Stairs-Rails: Right, Left Entrance Stairs-Number of Steps: 4 Discharge Bathroom Shower/Tub: Tub/shower unit Discharge Bathroom Toilet: Standard (with riser) Discharge Bathroom Accessibility: Yes How Accessible: Accessible via walker Does the patient have any problems obtaining your medications?: No  Social/Family/Support Systems Patient Roles: Spouse Contact Information: Marcey [phone] Anticipated Caregiver: Marcey is primary caregiver, can also update Mliss (DIL) [phone] Anticipated Caregiver's Contact Information: see above Ability/Limitations of Caregiver: Marcey can provide supervision, other family can provide some intermittent physical assist if needed, but this is not anticipated Caregiver Availability: 24/7 Discharge Plan Discussed with Primary Caregiver: Yes Is Caregiver In Agreement with Plan?: Yes  Goals Patient/Family Goal for Rehab: PT/OT supervision to mod I, SLP n/a Expected length of stay: 10-14 days Additional Information: Discharge plan: home with spouse 24/7, he can provide supervision only; other family members can provide intermittent physical assist but it is not anticipated that she will need it Pt/Family Agrees to Admission and willing to participate: Yes Program Orientation Provided & Reviewed with Pt/Caregiver Including Roles  & Responsibilities: Yes  Decrease burden of Care through IP rehab admission: n/a  Possible need for SNF placement upon discharge: No.  Plan discharge home with 24/7 supervision from spouse, intermittent additional support available from her children  Patient Condition: I have reviewed medical records from [facility], spoken with [facility] team, and patient, spouse, and daughter. I met with patient at the bedside for inpatient rehabilitation assessment.  Patient will benefit  from ongoing PT and OT, can actively participate in 3 hours of therapy a day 5 days of the week, and can make measurable gains during the admission.  Patient will also benefit from the coordinated team approach during an Inpatient Acute Rehabilitation admission.  The patient will receive intensive therapy as well as Rehabilitation physician, nursing, social worker, and care management interventions.  Due to safety, skin/wound care, disease management, medication administration, pain management, and patient education the patient requires 24 hour a day rehabilitation nursing.  The patient is currently min assist with mobility and basic ADLs.  Discharge setting and therapy post discharge at home with home health is anticipated.  Patient has agreed to participate in the Acute Inpatient Rehabilitation Program and will admit today.  Preadmission Screen Completed By:  Caitlin E Warren, PT,  DPT 02/24/2024 10:59 AM ______________________________________________________________________   Discussed status with Dr. Lorilee on 11:13 AM  at 02/24/24  and received approval for admission today.  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 Yes Co-Morbidities requiring supervision/potential complications: HTN, HLD, DM, GERD, recent transforaminal lumbar decompression and fusion at L4-L5 Due to bladder management, bowel management, safety, skin/wound care, disease management, medication administration, pain management, and patient education, does the patient require 24 hr/day rehab nursing? Yes Does the patient require coordinated care of a physician, rehab nurse, PT, OT to address physical and functional deficits in the context of the above  medical diagnosis(es)? Yes Addressing deficits in the following areas: balance, endurance, locomotion, strength, transferring, bowel/bladder control, bathing, dressing, feeding, grooming, toileting, and psychosocial support Can the patient actively participate in an intensive therapy program of at least 3 hrs of therapy 5 days a week? Yes The potential for patient to make measurable gains while on inpatient rehab is excellent Anticipated functional outcomes upon discharge from inpatient rehab: modified independent PT, modified independent OT, independent SLP Estimated rehab length of stay to reach the above functional goals is: 10-12 days Anticipated discharge destination: Home 10. Overall Rehab/Functional Prognosis: excellent   MD Signature: Sven Lorilee, MD

## 2024-02-24 NOTE — Progress Notes (Signed)
 Lorilee Sven SQUIBB, MD  Physician Physical Medicine and Rehabilitation   PMR Pre-admission    Signed   Date of Service: 02/24/2024 10:58 AM  Related encounter: ED to Hosp-Admission (Discharged) from 02/14/2024 in [facility] 6 [facility]  SURGICAL   Signed     Expand All Collapse All  PMR Admission Coordinator Pre-Admission Assessment   Patient: Diane Watkins is an 73 y.o., female MRN: [id number] DOB: December 03, 1950 Height: 5' 2 (157.5 cm) Weight: 93 kg   Insurance Information HMO:     PPO: yes     PCP:      IPA:      80/20:      OTHER:  PRIMARY: [employer]      Policy#: [id number]      Subscriber: pt CM Name: Jori      Phone#: [phone]     Fax#: epic access Pre-Cert#: [id number] auth for CIR from [place] with [facility] for admit 9/10 through 9/16.  Updates due weekly and they have epic access.       Employer:  Benefits:  Phone #: [phone]     Name:  Eff. Date: 06/17/23     Deduct: $0      Out of Pocket Max: $$3400 (met $341.87)      Life Max: n/a CIR: $325/day for days 1-6      SNF: 20 full days Outpatient:      Co-Pay: $15/visit Home Health: 100%      Co-Pay:  DME: 75%     Co-Pay: 25% Providers:  SECONDARY: [facility]       Policy#:      Phone#:    Artist:       Phone#:    The Data processing manager" for patients in Inpatient Rehabilitation Facilities with attached "Privacy Act Statement-Health Care Records" was provided and verbally reviewed with: Patient and Family   Emergency Contact Information Contact Information       Name Relation Home Work [place]   [phone]   [phone]    Giovanni,GLENN Spouse [phone]   [phone]    courts,stephanie Daughter [phone]   [phone]    FRANCHOT CLUNES Sister [phone]             Other Contacts   None on File        Current Medical History  Patient Admitting Diagnosis: CSF leak, lipoid meningitis   History of Present Illness: Pt is  a 73 y/o female with PMH of HTN, HLD, DM, GERD, and a recent transforaminal lumbar decompression and fusion at L4-5 on 8/25 per Dr. Reyne and discharged home.  She represented to [facility] on 8/31 with persistent headache.  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  MRI showed CSF leak.  She underwent re-exploration of her lumbar wound per Dr. Joshua on 9/3 with closure of dural defect.  Pt with persistent fever in the acute setting and cultures revealed enterobacter cloacae bacteremia.  She also has Phlebitis of the left hand at her IV site which was removed.  Consults to ID and pt will do 8 weeks of IV abx with EOT date 11/1.  She has a PICC line.  Therapy ongoing and pt was recommended for CIR.     Patient's medical record from [facility] has been reviewed by the rehabilitation admission coordinator and physician.   Past Medical History      Past Medical History:  Diagnosis Date   Arthritis  Diabetes mellitus without complication (HCC)     High cholesterol     History of hiatal hernia     Hyperlipidemia     Hypertension            Has the patient had major surgery during 100 days prior to admission? Yes   Family History   family history includes Alcohol  abuse in her father; Heart disease (age of onset: 22) in her father; Stroke in her mother.   Current Medications  Current Medications    Current Facility-Administered Medications:    acetaminophen  (TYLENOL ) suppository 650 mg, 650 mg, Rectal, Q6H PRN, Kc, Ramesh, MD   acetaminophen  (TYLENOL ) tablet 650 mg, 650 mg, Oral, Q6H PRN, 650 mg at 02/23/24 1624 **OR** [DISCONTINUED] acetaminophen  (TYLENOL ) suppository 650 mg, 650 mg, Rectal, Q6H PRN, Melvin, Alexander B, MD   ALPRAZolam  (XANAX ) tablet 0.25 mg, 0.25 mg, Oral, TID PRN, Kc, Ramesh, MD, 0.25 mg at 02/23/24 1616   butalbital -acetaminophen -caffeine  (FIORICET ) 50-325-40 MG per tablet 2 tablet, 2 tablet, Oral, Q6H PRN, Reyne Cordella SQUIBB, MD, 2 tablet at 02/24/24 0035    ceFEPIme  (MAXIPIME ) 2 g in sodium chloride  0.9 % 100 mL IVPB, 2 g, Intravenous, Q8H, Sinclair, Emily S, RPH, Last Rate: 200 mL/hr at 02/24/24 1010, 2 g at 02/24/24 1010   Chlorhexidine  Gluconate Cloth 2 % PADS 6 each, 6 each, Topical, Daily, Kc, Ramesh, MD, 6 each at 02/24/24 1020   cyanocobalamin  (VITAMIN B12) tablet 1,000 mcg, 1,000 mcg, Oral, Daily, Kc, Ramesh, MD, 1,000 mcg at 02/24/24 1018   cyclobenzaprine  (FLEXERIL ) tablet 10 mg, 10 mg, Oral, TID PRN, Melvin, Alexander B, MD, 10 mg at 02/21/24 1701   diphenhydrAMINE  (BENADRYL ) capsule 25 mg, 25 mg, Oral, Q6H PRN, Reyne Cordella SQUIBB, MD, 25 mg at 02/22/24 0041   docusate sodium  (COLACE) capsule 100 mg, 100 mg, Oral, BID, Reyne Cordella SQUIBB, MD, 100 mg at 02/24/24 1018   enoxaparin  (LOVENOX ) injection 40 mg, 40 mg, Subcutaneous, Q24H, Kc, Ramesh, MD, 40 mg at 02/23/24 1240   HYDROcodone -acetaminophen  (NORCO/VICODIN) 5-325 MG per tablet 1 tablet, 1 tablet, Oral, Q6H PRN, Reyne Cordella SQUIBB, MD, 1 tablet at 02/21/24 0345   hydrocortisone  1 % lotion, , Topical, PRN, Kc, Ramesh, MD   insulin  aspart (novoLOG ) injection 0-15 Units, 0-15 Units, Subcutaneous, TID WC, Melvin, Alexander B, MD, 2 Units at 02/24/24 9176   lisinopril  (ZESTRIL ) tablet 20 mg, 20 mg, Oral, QPM, Melvin, Alexander B, MD, 20 mg at 02/23/24 1834   loratadine  (CLARITIN ) tablet 10 mg, 10 mg, Oral, Daily, Danton Purchase T, MD, 10 mg at 02/24/24 1018   neomycin -bacitracin -polymyxin (NEOSPORIN) ointment, , Topical, BID, Kc, Ramesh, MD, Given at 02/24/24 1019   omega-3 acid ethyl esters (LOVAZA ) capsule 2 g, 2 capsule, Oral, BID, Melvin, Alexander B, MD, 2 g at 02/24/24 1018   ondansetron  (ZOFRAN ) injection 4 mg, 4 mg, Intravenous, Q6H PRN, Danton Purchase DASEN, MD, 4 mg at 02/24/24 9178   polyethylene glycol (MIRALAX  / GLYCOLAX ) packet 17 g, 17 g, Oral, Daily PRN, Melvin, Alexander B, MD, 17 g at 02/19/24 1734   rosuvastatin  (CRESTOR ) tablet 40 mg, 40 mg, Oral, QPM, Melvin, Alexander B,  MD, 40 mg at 02/23/24 1834   senna (SENOKOT) tablet 8.6 mg, 1 tablet, Oral, Daily PRN, Reyne Cordella SQUIBB, MD, 8.6 mg at 02/19/24 9072   senna-docusate (Senokot-S) tablet 1 tablet, 1 tablet, Oral, BID, Kc, Ramesh, MD, 1 tablet at 02/24/24 1018   sodium chloride  flush (NS) 0.9 % injection 10-40 mL, 10-40 mL,  Intracatheter, PRN, Christobal [place]adal[place], MD   sodium chloride  flush (NS) 0.9 % injection 3 mL, 3 mL, Intravenous, Q12H, Reyne Cordella SQUIBB, MD, 3 mL at 02/23/24 0902   sodium chloride  flush (NS) 0.9 % injection 3 mL, 3 mL, Intravenous, PRN, Reyne Cordella SQUIBB, MD, 3 mL at 02/17/24 2137   sodium phosphate  (FLEET) enema 1 enema, 1 enema, Rectal, Daily PRN, Reyne Cordella SQUIBB, MD, 1 enema at 02/19/24 1625   traMADol  (ULTRAM ) tablet 50 mg, 50 mg, Oral, Q6H PRN, Reyne Cordella SQUIBB, MD     Patients Current Diet:  Diet Order                  Diet regular Room service appropriate? Yes; Fluid consistency: Thin  Diet effective now                         Precautions / Restrictions Precautions Precautions: Back, Fall Precaution Booklet Issued: No Precaution/Restrictions Comments: verbally reviewed back prec Other Brace: no brace needed per orders Restrictions Weight Bearing Restrictions Per Provider Order: No    Has the patient had 2 or more falls or a fall with injury in the past year? No   Prior Activity Level Community (5-7x/wk): independent prior to surgery but with increased time and occasional use of DME   Prior Functional Level Self Care: Did the patient need help bathing, dressing, using the toilet or eating? Independent   Indoor Mobility: Did the patient need assistance with walking from room to room (with or without device)? Independent   Stairs: Did the patient need assistance with internal or external stairs (with or without device)? Independent   Functional Cognition: Did the patient need help planning regular tasks such as shopping or remembering to take medications?  Independent   Patient Information Are you of Hispanic, Latino/a,or Spanish origin?: A. No, not of Hispanic, Latino/a, or Spanish origin What is your race?: A. White Do you need or want an interpreter to communicate with a doctor or health care staff?: 0. No   Patient's Response To:  Health Literacy and Transportation Is the patient able to respond to health literacy and transportation needs?: Yes Health Literacy - How often do you need to have someone help you when you read instructions, pamphlets, or other written material from your doctor or pharmacy?: Never In the past 12 months, has lack of transportation kept you from medical appointments or from getting medications?: No In the past 12 months, has lack of transportation kept you from meetings, work, or from getting things needed for daily living?: No   Journalist, newspaper / Equipment Home Equipment: Toilet riser, Rollator (4 wheels), Cane - single point, Tub bench   Prior Device Use: Indicate devices/aids used by the patient prior to current illness, exacerbation or injury? Walker   Current Functional Level Cognition   Orientation Level: Oriented X4    Extremity Assessment (includes Sensation/Coordination)   Upper Extremity Assessment: Generalized weakness (mild RUE tremoring)  Lower Extremity Assessment: Defer to PT evaluation     ADLs   Overall ADL's : Needs assistance/impaired Eating/Feeding: Minimal assistance, Sitting Grooming: Wash/dry face, Oral care, Applying deodorant, Brushing hair, Standing, Supervision/safety Upper Body Bathing: Moderate assistance, Sitting Lower Body Bathing: Moderate assistance, Sitting/lateral leans Upper Body Dressing : Minimal assistance, Standing Lower Body Dressing: Moderate assistance Toilet Transfer: Supervision/safety, Ambulation, Rolling walker (2 wheels), Regular Toilet Toileting- Clothing Manipulation and Hygiene: Moderate assistance Toileting - Clothing Manipulation Details  (indicate cue type and  reason): for posterior pericare Functional mobility during ADLs: Supervision/safety, Rolling walker (2 wheels)     Mobility   Overal bed mobility: Needs Assistance Bed Mobility: Sidelying to Sit, Rolling Rolling: Min assist Sidelying to sit: Mod assist Sit to sidelying: Mod assist, +2 for physical assistance General bed mobility comments: OOB on BSC upon arrival, in chair at departure     Transfers   Overall transfer level: Needs assistance Equipment used: Rolling walker (2 wheels) Transfers: Sit to/from Stand, Bed to chair/wheelchair/BSC Sit to Stand: Supervision Bed to/from chair/wheelchair/BSC transfer type:: Step pivot Stand pivot transfers: Mod assist, +2 physical assistance, Min assist Step pivot transfers: Min assist General transfer comment: cues for hand placement, slow to rise. light assist to steady on rise and during step pivot     Ambulation / Gait / Stairs / Wheelchair Mobility   Ambulation/Gait Ambulation/Gait assistance: Contact guard assist, +2 safety/equipment, Min assist Gait Distance (Feet): 125 Feet Assistive device: Rolling walker (2 wheels) Gait Pattern/deviations: Step-through pattern, Decreased stride length General Gait Details: up to min A to maintain balance, cues for closer RW proximity and upright posture, distance limited due to fatigue and headache Gait velocity: decreased Gait velocity interpretation: <1.31 ft/sec, indicative of household ambulator     Posture / Balance Balance Overall balance assessment: Needs assistance Sitting-balance support: Feet supported Sitting balance-Leahy Scale: Good Standing balance support: Bilateral upper extremity supported Standing balance-Leahy Scale: Fair Standing balance comment: static standing at sink without AD     Special considerations/life events  Continuous Drip IV  maxipime , Skin surgical incision to lower back, phlebitis LUE, and Diabetic management yes    Previous Home  Environment (from acute therapy documentation) Living Arrangements: Spouse/significant other Available Help at Discharge: Family Type of Home: House Home Layout: One level, Other (Comment) Home Access: Stairs to enter Entrance Stairs-Rails: Right, Left Entrance Stairs-Number of Steps: 4 Bathroom Shower/Tub: Tub/shower unit Home Care Services: No   Discharge Living Setting Plans for Discharge Living Setting: Patient's home, Lives with (comment) (spouse) Type of Home at Discharge: House Discharge Home Layout: One level Discharge Home Access: Stairs to enter Entrance Stairs-Rails: Right, Left Entrance Stairs-Number of Steps: 4 Discharge Bathroom Shower/Tub: Tub/shower unit Discharge Bathroom Toilet: Standard (with riser) Discharge Bathroom Accessibility: Yes How Accessible: Accessible via walker Does the patient have any problems obtaining your medications?: No   Social/Family/Support Systems Patient Roles: Spouse Contact Information: Marcey [phone] Anticipated Caregiver: Marcey is primary caregiver, can also update Mliss (DIL) [phone] Anticipated Caregiver's Contact Information: see above Ability/Limitations of Caregiver: Marcey can provide supervision, other family can provide some intermittent physical assist if needed, but this is not anticipated Caregiver Availability: 24/7 Discharge Plan Discussed with Primary Caregiver: Yes Is Caregiver In Agreement with Plan?: Yes   Goals Patient/Family Goal for Rehab: PT/OT supervision to mod I, SLP n/a Expected length of stay: 10-14 days Additional Information: Discharge plan: home with spouse 24/7, he can provide supervision only; other family members can provide intermittent physical assist but it is not anticipated that she will need it Pt/Family Agrees to Admission and willing to participate: Yes Program Orientation Provided & Reviewed with Pt/Caregiver Including Roles  & Responsibilities: Yes   Decrease burden of Care  through IP rehab admission: n/a   Possible need for SNF placement upon discharge: No.  Plan discharge home with 24/7 supervision from spouse, intermittent additional support available from her children   Patient Condition: I have reviewed medical records from [facility], spoken with [facility] team, and patient, spouse, and  daughter. I met with patient at the bedside for inpatient rehabilitation assessment.  Patient will benefit from ongoing PT and OT, can actively participate in 3 hours of therapy a day 5 days of the week, and can make measurable gains during the admission.  Patient will also benefit from the coordinated team approach during an Inpatient Acute Rehabilitation admission.  The patient will receive intensive therapy as well as Rehabilitation physician, nursing, social worker, and care management interventions.  Due to safety, skin/wound care, disease management, medication administration, pain management, and patient education the patient requires 24 hour a day rehabilitation nursing.  The patient is currently min assist with mobility and basic ADLs.  Discharge setting and therapy post discharge at home with home health is anticipated.  Patient has agreed to participate in the Acute Inpatient Rehabilitation Program and will admit today.   Preadmission Screen Completed By:  Zedrick Springsteen E Thi Klich, PT, DPT 02/24/2024 10:59 AM ______________________________________________________________________   Discussed status with Dr. Lorilee on 11:13 AM  at 02/24/24  and received approval for admission today.   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 Yes Co-Morbidities requiring supervision/potential complications: HTN, HLD, DM,  GERD, recent transforaminal lumbar decompression and fusion at L4-L5 Due to bladder management, bowel management, safety, skin/wound care, disease management, medication administration, pain management, and patient education, does the patient require 24 hr/day rehab nursing? Yes Does the patient require coordinated care of a physician, rehab nurse, PT, OT to address physical and functional deficits in the context of the above medical diagnosis(es)? Yes Addressing deficits in the following areas: balance, endurance, locomotion, strength, transferring, bowel/bladder control, bathing, dressing, feeding, grooming, toileting, and psychosocial support Can the patient actively participate in an intensive therapy program of at least 3 hrs of therapy 5 days a week? Yes The potential for patient to make measurable gains while on inpatient rehab is excellent Anticipated functional outcomes upon discharge from inpatient rehab: modified independent PT, modified independent OT, independent SLP Estimated rehab length of stay to reach the above functional goals is: 10-12 days Anticipated discharge destination: Home 10. Overall Rehab/Functional Prognosis: excellent     MD Signature: Sven Lorilee, MD          Revision History

## 2024-02-24 NOTE — H&P (Shared)
 Physical Medicine and Rehabilitation Admission H&P    Chief Complaint  Patient presents with   Functional deficits due to meningitis    HPI:  Diane Watkins. Diane Watkins is a 73 year old female with history of T2DM, OA, HTN, GERD, transforaminal lumbar decompression w/fusion 02/08/24 and discharged on 02/10/24. She reported HA at discharge and returned to ED with severe unrelenting headaches.  CT head done revealing abnormal intracranial lipid material and small new CSF density subtentorial posterior fossa with question of lipoid meningitis. MRI lumbar spine showed suggestion of CSF leak with 117 ml paraspinal muscle fluid collection at level of posterior hardware.she underwent exploration of spine with repair of CSF leak and wound wash out. Neurology consulted for persistent HA with intermittent bouts of confusion and recommended supportive care and if persistent to evaluate for another CSF leak. IVF added for hydration due to ensure CSF pressure remains normal as well as poor intake.  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   She had developed fever on 09/05 and started on Keflex  due to concerns of cellulitis. BC repeated revealing enterobacter colace. Nausea/vomiting felt to be due to constipation and treated with laxative. She has had right hip pain as well as issues with anxiety.  MRI hip  09/07showed no evidence of septic arthritis or osteomyelitis in pelvis or right hip, partial insertional tear right gluteus medius tendon and thickening/enhancement of rectosigmoid colon suspicious for colitis. ID consulted for input and antibiotics narrowed to cefepime  with recommendations of 8 weeks IV antibiotics from 09/07 with end date of 11/1 followed by 6 months of supression with cipro due to presence of hardware.   CT abdomen pelvis repeated last night and negative for colitis or  acute findings. Intake remains poor with reports of abdominal discomfort and trying to self limit to liquids per patient. Severe pain right buttock and troc for past six months worse since injection for platelet exchange. Has had issues with disorientation at nights per DIL. Therapy has been working with patient  who is limited by HA, right hip pain, weakness and requires CGA/min assist for mobility and min to mod assist with ADL tasks. She was independent with RW prior to surgery andd CIR recommended due to functional decline.     Review of Systems  Constitutional:  Positive for malaise/fatigue (didn't sleep well last night--got back at midnight from CT).  HENT:  Negative for hearing loss.   Eyes:  Positive for blurred vision (today).  Respiratory:  Positive for shortness of breath.   Cardiovascular:  Positive for leg swelling. Negative for chest pain and palpitations.  Gastrointestinal:  Positive for abdominal pain and nausea. Negative for vomiting.  Genitourinary:  Positive for frequency. Negative for dysuria.  Musculoskeletal:  Positive for joint pain (right hip pain and buttock pain for past 6 months) and myalgias.  Neurological:  Positive for weakness and headaches (all the time but better overall. sharp shooting pains across forehead).  Psychiatric/Behavioral:  The patient is nervous/anxious and has insomnia.     Past Medical History:  Diagnosis Date   Arthritis    Diabetes mellitus without complication (HCC)    High cholesterol    History of hiatal hernia    Hyperlipidemia    Hypertension     Past Surgical History:  Procedure Laterality Date   BIOPSY  07/24/2021   Procedure: BIOPSY;  Surgeon: Golda Claudis PENNER, MD;  [facility];  Service: Endoscopy;;  polyp   CATARACT EXTRACTION W/PHACO Left 03/29/2018   Procedure: CATARACT EXTRACTION PHACO AND INTRAOCULAR LENS PLACEMENT (IOC);  Surgeon: Perley Hamilton, MD;  Location: [facility];  Service: Ophthalmology;  Laterality: Left;   CDE: 10.13   CHOLECYSTECTOMY     COLONOSCOPY WITH PROPOFOL  N/A 07/24/2021   Procedure: COLONOSCOPY WITH PROPOFOL ;  Surgeon: Golda Claudis PENNER, MD;  [facility]: [facility];  Service: Endoscopy;  Laterality: N/A;   ESOPHAGOGASTRODUODENOSCOPY (EGD) WITH PROPOFOL  N/A 07/24/2021   Procedure: ESOPHAGOGASTRODUODENOSCOPY (EGD) WITH PROPOFOL ;  Surgeon: Golda Claudis PENNER, MD;  [facility]: [facility];  Service: Endoscopy;  Laterality: N/A;   HARDWARE REMOVAL N/A 02/17/2024   Procedure: REMOVAL, HARDWARE;  Surgeon: Reyne Cordella SQUIBB, MD;  Location: [facility];  Service: Orthopedics;  Laterality: N/A;   INCISIONAL HERNIA REPAIR N/A 09/04/2021   Procedure: HERNIA REPAIR INCISIONAL W/MESH;  Surgeon: Mavis Anes, MD;  Location: [facility];  Service: General;  Laterality: N/A;   POLYPECTOMY  07/24/2021   Procedure: POLYPECTOMY;  Surgeon: Golda Claudis PENNER, MD;  [facility]: [facility];  Service: Endoscopy;;   TRANSFORAMINAL LUMBAR INTERBODY FUSION (TLIF) WITH PEDICLE SCREW FIXATION 1 LEVEL N/A 02/08/2024   Procedure: TRANSFORAMINAL LUMBAR INTERBODY FUSION (TLIF) WITH PEDICLE SCREW FIXATION 1 LEVEL;  Surgeon: Reyne Cordella SQUIBB, MD;  Location: [facility];  Service: Orthopedics;  Laterality: N/A;  Transforaminal lumber interbody fusion with pedicle screw fixation, lumbar 4-lumbar 5   TUBAL LIGATION     WOUND EXPLORATION N/A 02/17/2024   Procedure: WOUND EXPLORATION;  Surgeon: Reyne Cordella SQUIBB, MD;  Location: [facility];  Service: Orthopedics;  Laterality: N/A;  Exploration of wound, Repair of Spinal fluid Leak Lumbar four-five  Removal of screws and Replacement of same screws    Family History  Problem Relation Age of Onset   Stroke Mother    Alcohol  abuse Father    Heart disease Father 32       MI    Social History:  Married. Independent PTA. She  reports that she has never smoked. She has been exposed to tobacco smoke. She has never used smokeless tobacco. She reports that she does not currently use alcohol . She reports that she  does not use drugs.   Allergies  Allergen Reactions   Amoxil [Amoxicillin] Other (See Comments)    Bad headaches Has patient had a PCN reaction causing immediate rash, facial/tongue/throat swelling, SOB or lightheadedness with hypotension: No Has patient had a PCN reaction causing severe rash involving mucus membranes or skin necrosis: No Has patient had a PCN reaction that required hospitalization: No Has patient had a PCN reaction occurring within the last 10 years: No If all of the above answers are NO, then may proceed with Cephalosporin use.    Oxycodone -Acetaminophen  Itching   No medications prior to admission.    Home: Home Living Family/patient expects to be discharged to:: Private residence Living Arrangements: Spouse/significant other Available Help at Discharge: Family Type of Home: House Home Access: Stairs to enter Secretary/administrator of Steps: 4 Entrance Stairs-Rails: Right, Left Home Layout: One level, Other (Comment) Bathroom Shower/Tub: Tub/shower unit Home Equipment: Toilet riser, Rollator (4 wheels), Cane - single point, Tub bench   Functional History: Prior Function Prior Level of Function : Independent/Modified Independent Mobility Comments: mobilizing with RW, no falls, progressing distance with family ADLs Comments: incr time but ind  Functional Status:  Mobility: Bed Mobility Overal bed mobility: Needs Assistance Bed Mobility: Sidelying to Sit, Rolling Rolling: Min assist Sidelying  to sit: Mod assist Sit to sidelying: Mod assist, +2 for physical assistance General bed mobility comments: pt up in chair on arrival and at end of session Transfers Overall transfer level: Needs assistance Equipment used: Rolling walker (2 wheels) Transfers: Sit to/from Stand, Bed to chair/wheelchair/BSC Sit to Stand: Min assist, Contact guard assist Bed to/from chair/wheelchair/BSC transfer type:: Step pivot Stand pivot transfers: Mod assist, +2 physical  assistance, Min assist Step pivot transfers: Min assist General transfer comment: cues for hand placement, slow to rise. min A to stand from bench at end of hall without armrests Ambulation/Gait Ambulation/Gait assistance: Contact guard assist, +2 safety/equipment, Min assist Gait Distance (Feet): 200 Feet (x2) Assistive device: Rolling walker (2 wheels) Gait Pattern/deviations: Step-through pattern, Decreased stride length General Gait Details: up to min A to maintain balance, cues for closer RW proximity and upright posture Gait velocity: very slowed Gait velocity interpretation: <1.31 ft/sec, indicative of household ambulator    ADL: ADL Overall ADL's : Needs assistance/impaired Eating/Feeding: Minimal assistance, Sitting Grooming: Wash/dry face, Oral care, Applying deodorant, Brushing hair, Standing, Supervision/safety Upper Body Bathing: Moderate assistance, Sitting Lower Body Bathing: Moderate assistance, Sitting/lateral leans Upper Body Dressing : Minimal assistance, Standing Lower Body Dressing: Moderate assistance Toilet Transfer: Supervision/safety, Ambulation, Rolling walker (2 wheels), Regular Toilet Toileting- Clothing Manipulation and Hygiene: Moderate assistance Toileting - Clothing Manipulation Details (indicate cue type and reason): for posterior pericare Functional mobility during ADLs: Supervision/safety, Rolling walker (2 wheels)  Cognition: Cognition Orientation Level: Oriented X4 Cognition Arousal: Alert Behavior During Therapy: WFL for tasks assessed/performed   Blood pressure (!) 177/72, pulse 96, temperature 98.4 F (36.9 C), temperature source Oral, resp. rate 16, height 5' 2 (1.575 m), weight 93 kg, SpO2 97%. Physical Exam Vitals and nursing note reviewed.  Constitutional:      Appearance: She is well-developed.     Comments: Fatigued appearing  Neurological:     Mental Status: She is alert and oriented to person, place, and time.     Comments:  Flat affect. Proptosis noted. Able to follow simple motor commands.      Results for orders placed or performed during the hospital encounter of 02/14/24 (from the past 48 hours)  Glucose, capillary     Status: Abnormal   Collection Time: 02/22/24  6:01 PM  Result Value Ref Range   Glucose-Capillary 115 (H) 70 - 99 mg/dL    Comment: Glucose reference range applies only to samples taken after fasting for at least 8 hours.  Glucose, capillary     Status: Abnormal   Collection Time: 02/22/24  9:04 PM  Result Value Ref Range   Glucose-Capillary 120 (H) 70 - 99 mg/dL    Comment: Glucose reference range applies only to samples taken after fasting for at least 8 hours.  Comprehensive metabolic panel with GFR     Status: Abnormal   Collection Time: 02/23/24  2:18 AM  Result Value Ref Range   Sodium 142 135 - 145 mmol/L   Potassium 4.4 3.5 - 5.1 mmol/L   Chloride 106 98 - 111 mmol/L   CO2 21 (L) 22 - 32 mmol/L   Glucose, Bld 95 70 - 99 mg/dL    Comment: Glucose reference range applies only to samples taken after fasting for at least 8 hours.   BUN 16 8 - 23 mg/dL   Creatinine, Ser 9.17 0.44 - 1.00 mg/dL   Calcium  8.6 (L) 8.9 - 10.3 mg/dL   Total Protein 4.9 (L) 6.5 - 8.1 g/dL  Albumin  2.1 (L) 3.5 - 5.0 g/dL   AST 64 (H) 15 - 41 U/L   ALT 47 (H) 0 - 44 U/L   Alkaline Phosphatase 45 38 - 126 U/L   Total Bilirubin 0.5 0.0 - 1.2 mg/dL   GFR, Estimated >39 >39 mL/min    Comment: (NOTE) Calculated using the CKD-EPI Creatinine Equation (2021)    Anion gap 15 5 - 15    Comment: Performed at [facility], [address] [place], [place] [address]  CBC     Status: Abnormal   Collection Time: 02/23/24  2:18 AM  Result Value Ref Range   WBC 11.4 (H) 4.0 - 10.5 K/uL   RBC 3.20 (L) 3.87 - 5.11 MIL/uL   Hemoglobin 9.9 (L) 12.0 - 15.0 g/dL   HCT 69.7 (L) 63.9 - 53.9 %   MCV 94.4 80.0 - 100.0 fL   MCH 30.9 26.0 - 34.0 pg   MCHC 32.8 30.0 - 36.0 g/dL   RDW 86.3 88.4 - 84.4 %    Platelets 366 150 - 400 K/uL   nRBC 0.0 0.0 - 0.2 %    Comment: Performed at [facility], [address] [place], [place] [address]  Glucose, capillary     Status: Abnormal   Collection Time: 02/23/24  8:38 AM  Result Value Ref Range   Glucose-Capillary 121 (H) 70 - 99 mg/dL    Comment: Glucose reference range applies only to samples taken after fasting for at least 8 hours.  Glucose, capillary     Status: None   Collection Time: 02/23/24 11:50 AM  Result Value Ref Range   Glucose-Capillary 89 70 - 99 mg/dL    Comment: Glucose reference range applies only to samples taken after fasting for at least 8 hours.  Glucose, capillary     Status: Abnormal   Collection Time: 02/23/24  6:16 PM  Result Value Ref Range   Glucose-Capillary 110 (H) 70 - 99 mg/dL    Comment: Glucose reference range applies only to samples taken after fasting for at least 8 hours.  Comprehensive metabolic panel with GFR     Status: Abnormal   Collection Time: 02/24/24  3:15 AM  Result Value Ref Range   Sodium 137 135 - 145 mmol/L   Potassium 3.9 3.5 - 5.1 mmol/L   Chloride 105 98 - 111 mmol/L   CO2 23 22 - 32 mmol/L   Glucose, Bld 128 (H) 70 - 99 mg/dL    Comment: Glucose reference range applies only to samples taken after fasting for at least 8 hours.   BUN 17 8 - 23 mg/dL   Creatinine, Ser 9.11 0.44 - 1.00 mg/dL   Calcium  8.6 (L) 8.9 - 10.3 mg/dL   Total Protein 5.3 (L) 6.5 - 8.1 g/dL   Albumin  2.2 (L) 3.5 - 5.0 g/dL   AST 76 (H) 15 - 41 U/L   ALT 62 (H) 0 - 44 U/L   Alkaline Phosphatase 51 38 - 126 U/L   Total Bilirubin 0.5 0.0 - 1.2 mg/dL   GFR, Estimated >39 >39 mL/min    Comment: (NOTE) Calculated using the CKD-EPI Creatinine Equation (2021)    Anion gap 9 5 - 15    Comment: Performed at [facility], [address] [place], [place] [address]  CBC     Status: Abnormal   Collection Time: 02/24/24  3:15 AM  Result Value Ref Range   WBC 11.4 (H) 4.0 - 10.5  K/uL   RBC 3.17 (L) 3.87 -  5.11 MIL/uL   Hemoglobin 9.8 (L) 12.0 - 15.0 g/dL   HCT 70.5 (L) 63.9 - 53.9 %   MCV 92.7 80.0 - 100.0 fL   MCH 30.9 26.0 - 34.0 pg   MCHC 33.3 30.0 - 36.0 g/dL   RDW 86.2 88.4 - 84.4 %   Platelets 422 (H) 150 - 400 K/uL   nRBC 0.0 0.0 - 0.2 %    Comment: Performed at [facility], [address], [place], [place] [address]  Glucose, capillary     Status: Abnormal   Collection Time: 02/24/24  8:11 AM  Result Value Ref Range   Glucose-Capillary 122 (H) 70 - 99 mg/dL    Comment: Glucose reference range applies only to samples taken after fasting for at least 8 hours.   Comment 1 Notify RN   Glucose, capillary     Status: Abnormal   Collection Time: 02/24/24 12:11 PM  Result Value Ref Range   Glucose-Capillary 118 (H) 70 - 99 mg/dL    Comment: Glucose reference range applies only to samples taken after fasting for at least 8 hours.   Comment 1 Notify RN    CT ABDOMEN PELVIS WO CONTRAST Result Date: 02/23/2024 CLINICAL DATA:  Abdominal pain, postop EXAM: CT ABDOMEN AND PELVIS WITHOUT CONTRAST TECHNIQUE: Multidetector CT imaging of the abdomen and pelvis was performed following the standard protocol without IV contrast. RADIATION DOSE REDUCTION: This exam was performed according to the departmental dose-optimization program which includes automated exposure control, adjustment of the mA and/or kV according to patient size and/or use of iterative reconstruction technique. COMPARISON:  09/27/2020 FINDINGS: Lower chest: No acute findings Hepatobiliary: No focal liver abnormality is seen. Status post cholecystectomy. No biliary dilatation. Pancreas: No focal abnormality or ductal dilatation. Spleen: No focal abnormality.  Normal size. Adrenals/Urinary Tract: No adrenal abnormality. No focal renal abnormality. No stones or hydronephrosis. Urinary bladder is unremarkable. Stomach/Bowel: Sigmoid diverticulosis. No active diverticulitis. Stomach and small bowel decompressed, unremarkable.  Vascular/Lymphatic: Aortic atherosclerosis. No evidence of aneurysm or adenopathy. Reproductive: Uterus and adnexa unremarkable.  No mass. Other: No free fluid or free air. Small umbilical hernia containing fat. Evidence of prior repair of right supraumbilical ventral hernia. There appears to be a small recurrent hernia containing fat in this region. Musculoskeletal: No acute bony abnormality. IMPRESSION: No acute findings in the abdomen or pelvis. Aortic atherosclerosis. Small umbilical hernia containing fat. Prior repair of fat containing right supraumbilical ventral hernia with small hernia recurrence containing fat. Electronically Signed   By: Franky Crease M.D.   On: 02/23/2024 23:44   US  EKG SITE RITE Result Date: 02/23/2024 If Site Rite image not attached, placement could not be confirmed due to current cardiac rhythm.     Blood pressure (!) 177/72, pulse 96, temperature 98.4 F (36.9 C), temperature source Oral, resp. rate 16, height 5' 2 (1.575 m), weight 93 kg, SpO2 97%.  Medical Problem List and Plan: 1. Functional deficits secondary to ***  -patient may *** shower  -ELOS/Goals: *** 2.  Antithrombotics: -DVT/anticoagulation:  Pharmaceutical: Lovenox   -antiplatelet therapy: N/A 3. Pain Management: Fioricet  and hydrocodone  prn for pain.  --gabapentin  added at nights for hip pain  --Voltaren  gel added to right hip for local measures.  4. Mood/Behavior/Sleep: LCSW to follow for evaluation and support.   --Monitor sleep hygiene. Has had issues with insomnia. Will continue Xanax  prn for anxiety  -antipsychotic agents: N/A 5. Neuropsych/cognition: This patient may be  intermittently capable of making decisions on her own behalf. 6. Skin/Wound Care:  Routine pressure relief measures.  7. Fluids/Electrolytes/Nutrition: Monitor I/O. Encourage fluid intake. 8. 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  9. T2DM:  Hgb A1C-7.0. Monitor BS ac/hs and use SSI for elevated BS  --may need oral agent for better control. Will monitor for now.  10. Leucocytosis improving from rise to 17.7-->11.4. 11. Metabolic encephalopathy: Question due to lipoid meningitis, B12 deficiency, surgeries, etc  --delirium precautions as has disorientation at nights.  12. Anemia of chronic illness: Recheck CBC in am. Hgb trending down from 10.7-->9.8.  --monitor for signs of bleeding.  13.  Abnormal LFTs: Recheck in am.  14. Rectosigmoid colitis?: Monitor intake. On Senna S. GIP negative.   15. 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     ***  Sharlet GORMAN Schmitz, PA-C 02/24/2024

## 2024-02-24 NOTE — Plan of Care (Signed)

## 2024-02-24 NOTE — Plan of Care (Signed)
  Problem: Consults Goal: RH GENERAL PATIENT EDUCATION Description: See Patient Education module for education specifics. Outcome: Progressing   Problem: RH BOWEL ELIMINATION Goal: RH STG MANAGE BOWEL WITH ASSISTANCE Description: STG Manage Bowel with Assistance. Outcome: Progressing   Problem: RH BLADDER ELIMINATION Goal: RH STG MANAGE BLADDER WITH ASSISTANCE Description: STG Manage Bladder With  mod I Assistance Outcome: Progressing   Problem: RH SKIN INTEGRITY Goal: RH STG SKIN FREE OF INFECTION/BREAKDOWN Description: Manage skin free of infection with mod I assistance Outcome: Progressing   Problem: RH SAFETY Goal: RH STG ADHERE TO SAFETY PRECAUTIONS W/ASSISTANCE/DEVICE Description: STG Adhere to Safety Precautions With  mod I Assistance/Device. Outcome: Progressing   Problem: RH PAIN MANAGEMENT Goal: RH STG PAIN MANAGED AT OR BELOW PT'S PAIN GOAL Description: < 4 w/ prns  Outcome: Progressing   Problem: RH KNOWLEDGE DEFICIT GENERAL Goal: RH STG INCREASE KNOWLEDGE OF SELF CARE AFTER HOSPITALIZATION Description: Manage increase knowledge deficits f self care after hospitalization with  mod I assistance from spouse using educational materials provided Outcome: Progressing

## 2024-02-24 NOTE — Progress Notes (Signed)
 Pt off the unit for CT scan

## 2024-02-24 NOTE — H&P (Signed)
 Physical Medicine and Rehabilitation Admission H&P    Chief Complaint  Patient presents with   Functional deficits due to meningitis    HPI:  Diane Watkins is a 73 year old female with history of T2DM, OA, HTN, GERD, transforaminal lumbar decompression w/fusion 02/08/24 and discharged on 02/10/24. She reported HA at discharge and returned to ED with severe unrelenting headaches.  CT head done revealing abnormal intracranial lipid material and small new CSF density subtentorial posterior fossa with question of lipoid meningitis. MRI lumbar spine showed suggestion of CSF leak with 117 ml paraspinal muscle fluid collection at level of posterior hardware.she underwent exploration of spine with repair of CSF leak and wound wash out. Neurology consulted for persistent HA with intermittent bouts of confusion and recommended supportive care and if persistent to evaluate for another CSF leak. IVF added for hydration due to ensure CSF pressure remains normal as well as poor intake.  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   She had developed fever on 09/05 and started on Keflex  due to concerns of cellulitis. BC repeated revealing enterobacter colace. Nausea/vomiting felt to be due to constipation and treated with laxative. She has had right hip pain as well as issues with anxiety.  MRI hip  09/07showed no evidence of septic arthritis or osteomyelitis in pelvis or right hip, partial insertional tear right gluteus medius tendon and thickening/enhancement of rectosigmoid colon suspicious for colitis. ID consulted for input and antibiotics narrowed to cefepime  with recommendations of 8 weeks IV antibiotics from 09/07 with end date of 11/1 followed by 6 months of supression with cipro due to presence of hardware.   CT abdomen pelvis repeated last night and negative for colitis or  acute findings. Intake remains poor with reports of abdominal discomfort and trying to self limit to liquids per patient. Severe pain right buttock and troc for past six months worse since injection for platelet exchange. Has had issues with disorientation at nights per DIL. Therapy has been working with patient  who is limited by HA, right hip pain, weakness and requires CGA/min assist for mobility and min to mod assist with ADL tasks. She was independent with RW prior to surgery andd CIR recommended due to functional decline. C/o right hip pain.  Review of Systems  Constitutional:  Positive for malaise/fatigue (didn't sleep well last night--got back at midnight from CT).  HENT:  Negative for hearing loss.   Eyes:  Positive for blurred vision (today).  Respiratory:  Positive for shortness of breath.   Cardiovascular:  Positive for leg swelling. Negative for chest pain and palpitations.  Gastrointestinal:  Positive for abdominal pain and nausea. Negative for vomiting.  Genitourinary:  Positive for frequency. Negative for dysuria.  Musculoskeletal:  Positive for joint pain (right hip pain and buttock pain for past 6 months) and myalgias.  Neurological:  Positive for weakness and headaches (all the time but better overall. sharp shooting pains across forehead).  Psychiatric/Behavioral:  The patient is nervous/anxious and has insomnia.     Past Medical History:  Diagnosis Date   Arthritis    Diabetes mellitus without complication (HCC)    High cholesterol    History of hiatal hernia    Hyperlipidemia    Hypertension     Past Surgical History:  Procedure Laterality Date   BIOPSY  07/24/2021   Procedure: BIOPSY;  Surgeon: Golda Claudis PENNER, MD;  [facility];  Service: Endoscopy;;  polyp   CATARACT EXTRACTION W/PHACO Left 03/29/2018   Procedure: CATARACT EXTRACTION PHACO AND INTRAOCULAR LENS PLACEMENT (IOC);  Surgeon: Perley Hamilton, MD;  Location: [facility];  Service: Ophthalmology;   Laterality: Left;  CDE: 10.13   CHOLECYSTECTOMY     COLONOSCOPY WITH PROPOFOL  N/A 07/24/2021   Procedure: COLONOSCOPY WITH PROPOFOL ;  Surgeon: Golda Claudis PENNER, MD;  [facility];  Service: Endoscopy;  Laterality: N/A;   ESOPHAGOGASTRODUODENOSCOPY (EGD) WITH PROPOFOL  N/A 07/24/2021   Procedure: ESOPHAGOGASTRODUODENOSCOPY (EGD) WITH PROPOFOL ;  Surgeon: Golda Claudis PENNER, MD;  [facility];  Service: Endoscopy;  Laterality: N/A;   HARDWARE REMOVAL N/A 02/17/2024   Procedure: REMOVAL, HARDWARE;  Surgeon: Reyne Cordella SQUIBB, MD;  Location: [facility];  Service: Orthopedics;  Laterality: N/A;   INCISIONAL HERNIA REPAIR N/A 09/04/2021   Procedure: HERNIA REPAIR INCISIONAL W/MESH;  Surgeon: Mavis Anes, MD;  Location: [facility];  Service: General;  Laterality: N/A;   POLYPECTOMY  07/24/2021   Procedure: POLYPECTOMY;  Surgeon: Golda Claudis PENNER, MD;  [facility]: [facility];  Service: Endoscopy;;   TRANSFORAMINAL LUMBAR INTERBODY FUSION (TLIF) WITH PEDICLE SCREW FIXATION 1 LEVEL N/A 02/08/2024   Procedure: TRANSFORAMINAL LUMBAR INTERBODY FUSION (TLIF) WITH PEDICLE SCREW FIXATION 1 LEVEL;  Surgeon: Reyne Cordella SQUIBB, MD;  Location: [facility];  Service: Orthopedics;  Laterality: N/A;  Transforaminal lumber interbody fusion with pedicle screw fixation, lumbar 4-lumbar 5   TUBAL LIGATION     WOUND EXPLORATION N/A 02/17/2024   Procedure: WOUND EXPLORATION;  Surgeon: Reyne Cordella SQUIBB, MD;  Location: [facility];  Service: Orthopedics;  Laterality: N/A;  Exploration of wound, Repair of Spinal fluid Leak Lumbar four-five  Removal of screws and Replacement of same screws    Family History  Problem Relation Age of Onset   Stroke Mother    Alcohol  abuse Father    Heart disease Father 56       MI    Social History:  Married. Independent PTA. She  reports that she has never smoked. She has been exposed to tobacco smoke. She has never used smokeless tobacco. She reports that she does not currently use alcohol . She  reports that she does not use drugs.   Allergies  Allergen Reactions   Amoxil [Amoxicillin] Other (See Comments)    Bad headaches Has patient had a PCN reaction causing immediate rash, facial/tongue/throat swelling, SOB or lightheadedness with hypotension: No Has patient had a PCN reaction causing severe rash involving mucus membranes or skin necrosis: No Has patient had a PCN reaction that required hospitalization: No Has patient had a PCN reaction occurring within the last 10 years: No If all of the above answers are NO, then may proceed with Cephalosporin use.    Oxycodone -Acetaminophen  Itching   Medications Prior to Admission  Medication Sig Dispense Refill   acetaminophen  (TYLENOL ) 325 MG tablet Take 650 mg by mouth every 6 (six) hours as needed (pain.).     ceFEPime  (MAXIPIME ) IVPB Inject 2 g into the vein every 8 (eight) hours. Indication:  Enterobacter cloacae bacteremia/lumbar hardware infection First Dose: Yes Last Day of Therapy:  04/14/24  Labs - Once weekly:  CBC/D and BMP, Labs - Once weekly: ESR and CRP Method of administration: IV Push Method of administration may be changed at the discretion of home infusion pharmacist based upon assessment of the patient and/or caregiver's ability to self-administer the medication ordered. 153 Units 0   cetirizine (ZYRTEC) 10 MG tablet Take  10 mg by mouth daily as needed for allergies.     cyclobenzaprine  (FLEXERIL ) 10 MG tablet Take 1 tablet (10 mg total) by mouth 3 (three) times daily as needed for muscle spasms.     ibuprofen (ADVIL) 200 MG tablet Take 600 mg by mouth daily as needed (pain.).     lisinopril  (ZESTRIL ) 20 MG tablet Take 1 tablet (20 mg total) by mouth daily. (Patient taking differently: Take 20 mg by mouth every evening.) 90 tablet 3   omega-3 acid ethyl esters (LOVAZA ) 1 g capsule Take 2 capsules (2 g total) by mouth 2 (two) times daily. 360 capsule 3   Polyethyl Glycol-Propyl Glycol 0.4-0.3 % SOLN Place 1-2 drops  into both eyes 3 (three) times daily as needed (pain.).     rosuvastatin  (CRESTOR ) 40 MG tablet Take 1 tablet (40 mg total) by mouth daily. (Patient taking differently: Take 40 mg by mouth every evening.) 90 tablet 3   Home: Home Living Family/patient expects to be discharged to:: Private residence Living Arrangements: Spouse/significant other Available Help at Discharge: Family Type of Home: House Home Access: Stairs to enter Secretary/administrator of Steps: 4 Entrance Stairs-Rails: Right, Left Home Layout: One level, Other (Comment) Bathroom Shower/Tub: Tub/shower unit Home Equipment: Toilet riser, Rollator (4 wheels), Cane - single point, Tub bench   Functional History: Prior Function Prior Level of Function : Independent/Modified Independent Mobility Comments: mobilizing with RW, no falls, progressing distance with family ADLs Comments: incr time but ind   Functional Status:  Mobility: Bed Mobility Overal bed mobility: Needs Assistance Bed Mobility: Sidelying to Sit, Rolling Rolling: Min assist Sidelying to sit: Mod assist Sit to sidelying: Mod assist, +2 for physical assistance General bed mobility comments: pt up in chair on arrival and at end of session Transfers Overall transfer level: Needs assistance Equipment used: Rolling walker (2 wheels) Transfers: Sit to/from Stand, Bed to chair/wheelchair/BSC Sit to Stand: Min assist, Contact guard assist Bed to/from chair/wheelchair/BSC transfer type:: Step pivot Stand pivot transfers: Mod assist, +2 physical assistance, Min assist Step pivot transfers: Min assist General transfer comment: cues for hand placement, slow to rise. min A to stand from bench at end of hall without armrests Ambulation/Gait Ambulation/Gait assistance: Contact guard assist, +2 safety/equipment, Min assist Gait Distance (Feet): 200 Feet (x2) Assistive device: Rolling walker (2 wheels) Gait Pattern/deviations: Step-through pattern, Decreased stride  length General Gait Details: up to min A to maintain balance, cues for closer RW proximity and upright posture Gait velocity: very slowed Gait velocity interpretation: <1.31 ft/sec, indicative of household ambulator   ADL: ADL Overall ADL's : Needs assistance/impaired Eating/Feeding: Minimal assistance, Sitting Grooming: Wash/dry face, Oral care, Applying deodorant, Brushing hair, Standing, Supervision/safety Upper Body Bathing: Moderate assistance, Sitting Lower Body Bathing: Moderate assistance, Sitting/lateral leans Upper Body Dressing : Minimal assistance, Standing Lower Body Dressing: Moderate assistance Toilet Transfer: Supervision/safety, Ambulation, Rolling walker (2 wheels), Regular Toilet Toileting- Clothing Manipulation and Hygiene: Moderate assistance Toileting - Clothing Manipulation Details (indicate cue type and reason): for posterior pericare Functional mobility during ADLs: Supervision/safety, Rolling walker (2 wheels)   Cognition: Cognition Orientation Level: Oriented X4 Cognition Arousal: Alert Behavior During Therapy: WFL for tasks assessed/performed    Blood pressure (!) 154/84, pulse (!) 109, temperature 98.5 F (36.9 C), resp. rate 18, height 5' 2 (1.575 m), weight 93 kg, SpO2 99%. Physical Exam Vitals and nursing note reviewed.  Constitutional:      Appearance: She is well-developed.     Comments: Fatigued appearing Gen: no distress,  normal appearing HEENT: oral mucosa pink and moist, NCAT Cardio: Reg rate Chest: normal effort, normal rate of breathing Abd: soft, non-distended Ext: no edema Psych: pleasant, normal affect Skin: intact Neurological:     Mental Status: She is alert and oriented to person, place, and time.     Comments: Flat affect. Proptosis noted. Able to follow simple motor commands. 5/5 strength except for RLE which is limited by hip pain     Results for orders placed or performed during the hospital encounter of 02/14/24 (from  the past 48 hours)  Glucose, capillary     Status: Abnormal   Collection Time: 02/22/24  9:04 PM  Result Value Ref Range   Glucose-Capillary 120 (H) 70 - 99 mg/dL    Comment: Glucose reference range applies only to samples taken after fasting for at least 8 hours.  Comprehensive metabolic panel with GFR     Status: Abnormal   Collection Time: 02/23/24  2:18 AM  Result Value Ref Range   Sodium 142 135 - 145 mmol/L   Potassium 4.4 3.5 - 5.1 mmol/L   Chloride 106 98 - 111 mmol/L   CO2 21 (L) 22 - 32 mmol/L   Glucose, Bld 95 70 - 99 mg/dL    Comment: Glucose reference range applies only to samples taken after fasting for at least 8 hours.   BUN 16 8 - 23 mg/dL   Creatinine, Ser 9.17 0.44 - 1.00 mg/dL   Calcium  8.6 (L) 8.9 - 10.3 mg/dL   Total Protein 4.9 (L) 6.5 - 8.1 g/dL   Albumin  2.1 (L) 3.5 - 5.0 g/dL   AST 64 (H) 15 - 41 U/L   ALT 47 (H) 0 - 44 U/L   Alkaline Phosphatase 45 38 - 126 U/L   Total Bilirubin 0.5 0.0 - 1.2 mg/dL   GFR, Estimated >39 >39 mL/min    Comment: (NOTE) Calculated using the CKD-EPI Creatinine Equation (2021)    Anion gap 15 5 - 15    Comment: Performed at [facility], [address] [place], [place] [address]  CBC     Status: Abnormal   Collection Time: 02/23/24  2:18 AM  Result Value Ref Range   WBC 11.4 (H) 4.0 - 10.5 K/uL   RBC 3.20 (L) 3.87 - 5.11 MIL/uL   Hemoglobin 9.9 (L) 12.0 - 15.0 g/dL   HCT 69.7 (L) 63.9 - 53.9 %   MCV 94.4 80.0 - 100.0 fL   MCH 30.9 26.0 - 34.0 pg   MCHC 32.8 30.0 - 36.0 g/dL   RDW 86.3 88.4 - 84.4 %   Platelets 366 150 - 400 K/uL   nRBC 0.0 0.0 - 0.2 %    Comment: Performed at [facility], [address] [place], [place] [address]  Glucose, capillary     Status: Abnormal   Collection Time: 02/23/24  8:38 AM  Result Value Ref Range   Glucose-Capillary 121 (H) 70 - 99 mg/dL    Comment: Glucose reference range applies only to samples taken after fasting for at least 8 hours.  Glucose, capillary      Status: None   Collection Time: 02/23/24 11:50 AM  Result Value Ref Range   Glucose-Capillary 89 70 - 99 mg/dL    Comment: Glucose reference range applies only to samples taken after fasting for at least 8 hours.  Glucose, capillary     Status: Abnormal   Collection Time: 02/23/24  6:16 PM  Result Value Ref  Range   Glucose-Capillary 110 (H) 70 - 99 mg/dL    Comment: Glucose reference range applies only to samples taken after fasting for at least 8 hours.  Comprehensive metabolic panel with GFR     Status: Abnormal   Collection Time: 02/24/24  3:15 AM  Result Value Ref Range   Sodium 137 135 - 145 mmol/L   Potassium 3.9 3.5 - 5.1 mmol/L   Chloride 105 98 - 111 mmol/L   CO2 23 22 - 32 mmol/L   Glucose, Bld 128 (H) 70 - 99 mg/dL    Comment: Glucose reference range applies only to samples taken after fasting for at least 8 hours.   BUN 17 8 - 23 mg/dL   Creatinine, Ser 9.11 0.44 - 1.00 mg/dL   Calcium  8.6 (L) 8.9 - 10.3 mg/dL   Total Protein 5.3 (L) 6.5 - 8.1 g/dL   Albumin  2.2 (L) 3.5 - 5.0 g/dL   AST 76 (H) 15 - 41 U/L   ALT 62 (H) 0 - 44 U/L   Alkaline Phosphatase 51 38 - 126 U/L   Total Bilirubin 0.5 0.0 - 1.2 mg/dL   GFR, Estimated >39 >39 mL/min    Comment: (NOTE) Calculated using the CKD-EPI Creatinine Equation (2021)    Anion gap 9 5 - 15    Comment: Performed at [facility], [address], [place], [place] [address]  CBC     Status: Abnormal   Collection Time: 02/24/24  3:15 AM  Result Value Ref Range   WBC 11.4 (H) 4.0 - 10.5 K/uL   RBC 3.17 (L) 3.87 - 5.11 MIL/uL   Hemoglobin 9.8 (L) 12.0 - 15.0 g/dL   HCT 70.5 (L) 63.9 - 53.9 %   MCV 92.7 80.0 - 100.0 fL   MCH 30.9 26.0 - 34.0 pg   MCHC 33.3 30.0 - 36.0 g/dL   RDW 86.2 88.4 - 84.4 %   Platelets 422 (H) 150 - 400 K/uL   nRBC 0.0 0.0 - 0.2 %    Comment: Performed at [facility], [address] [place], [place] [address]  Glucose, capillary     Status: Abnormal   Collection Time: 02/24/24  8:11 AM   Result Value Ref Range   Glucose-Capillary 122 (H) 70 - 99 mg/dL    Comment: Glucose reference range applies only to samples taken after fasting for at least 8 hours.   Comment 1 Notify RN   Glucose, capillary     Status: Abnormal   Collection Time: 02/24/24 12:11 PM  Result Value Ref Range   Glucose-Capillary 118 (H) 70 - 99 mg/dL    Comment: Glucose reference range applies only to samples taken after fasting for at least 8 hours.   Comment 1 Notify RN    CT ABDOMEN PELVIS WO CONTRAST Result Date: 02/23/2024 CLINICAL DATA:  Abdominal pain, postop EXAM: CT ABDOMEN AND PELVIS WITHOUT CONTRAST TECHNIQUE: Multidetector CT imaging of the abdomen and pelvis was performed following the standard protocol without IV contrast. RADIATION DOSE REDUCTION: This exam was performed according to the departmental dose-optimization program which includes automated exposure control, adjustment of the mA and/or kV according to patient size and/or use of iterative reconstruction technique. COMPARISON:  09/27/2020 FINDINGS: Lower chest: No acute findings Hepatobiliary: No focal liver abnormality is seen. Status post cholecystectomy. No biliary dilatation. Pancreas: No focal abnormality or ductal dilatation. Spleen: No focal abnormality.  Normal size. Adrenals/Urinary Tract: No adrenal abnormality. No focal renal abnormality. No stones  or hydronephrosis. Urinary bladder is unremarkable. Stomach/Bowel: Sigmoid diverticulosis. No active diverticulitis. Stomach and small bowel decompressed, unremarkable. Vascular/Lymphatic: Aortic atherosclerosis. No evidence of aneurysm or adenopathy. Reproductive: Uterus and adnexa unremarkable.  No mass. Other: No free fluid or free air. Small umbilical hernia containing fat. Evidence of prior repair of right supraumbilical ventral hernia. There appears to be a small recurrent hernia containing fat in this region. Musculoskeletal: No acute bony abnormality. IMPRESSION: No acute findings in  the abdomen or pelvis. Aortic atherosclerosis. Small umbilical hernia containing fat. Prior repair of fat containing right supraumbilical ventral hernia with small hernia recurrence containing fat. Electronically Signed   By: Franky Crease M.D.   On: 02/23/2024 23:44   US  EKG SITE RITE Result Date: 02/23/2024 If Site Rite image not attached, placement could not be confirmed due to current cardiac rhythm.     Blood pressure (!) 154/84, pulse (!) 109, temperature 98.5 F (36.9 C), resp. rate 18, height 5' 2 (1.575 m), weight 93 kg, SpO2 99%.  Medical Problem List and Plan: 1. Functional deficits secondary to postoperative CSF leak  -patient may shower  -ELOS/Goals: 9-12 days modI  Admit to CIR  2.  Antithrombotics: -DVT/anticoagulation:  Pharmaceutical: Lovenox   -antiplatelet therapy: N/A  3. Pain Management: Fioricet  and hydrocodone  prn for pain. Kpad ordered  --gabapentin  added at nights for hip pain  --Voltaren  gel added to right hip for local measures.  4. Mood/Behavior/Sleep: LCSW to follow for evaluation and support.   --Monitor sleep hygiene. Has had issues with insomnia. Will continue Xanax  prn for anxiety  -antipsychotic agents: N/A 5. Neuropsych/cognition: This patient may be intermittently capable of making decisions on her own behalf.  6. Skin/Wound Care:  Routine pressure relief measures.   7. Fluids/Electrolytes/Nutrition: Monitor I/O. Encourage fluid intake.  8. 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 Vitamin C supplement started  9. T2DM: Hgb A1C-7.0. d/c CBGs checks and ISS since well controlled  10. Leucocytosis improving from rise to 17.7-->11.4.  11. Metabolic encephalopathy: Question due to lipoid meningitis, B12 deficiency, surgeries, etc  --delirium precautions as has disorientation at nights.   12. Anemia of chronic illness: Recheck CBC in am. Hgb trending down from  10.7-->9.8.  --monitor for signs of bleeding.   13.  Abnormal LFTs: Recheck in am.   14. Rectosigmoid colitis?: Monitor intake. On Senna S. GIP negative.    15. 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   16. Tachycardia: magnesium  supplement started  17. HTN: magnesium  supplement started  I have personally performed a face to face diagnostic evaluation, including, but not limited to relevant history and physical exam findings, of this patient and developed relevant assessment and plan.  Additionally, I have reviewed and concur with the physician assistant's documentation above.  Sharlet GORMAN Schmitz, PA-C   Sven SHAUNNA Elks, MD 02/24/2024

## 2024-02-24 NOTE — Progress Notes (Signed)
 Physical Therapy Treatment Patient Details Name: Diane Watkins MRN: [id number] DOB: 1950/06/26 Today's Date: 02/24/2024   History of Present Illness Pt is a 73 y/o F presenting to ED 8/31 with persistent headache.  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 MRI showing persistent leak, underwent exploration of the lumbar wound with closure of dural defect on 9/3. PMH of HTN, HLD, DM2, GERD, and a recent transforaminal lumbar decompression and fusion at L4-5 on 02/08/2024    PT Comments  Pt up in chair on arrival, eager for mobility and demonstrating good progress towards acute goals. Pt able to progress gait distance this session with RW for support, however continues to demonstrate very slow and effortful gait speed with increased UE reliance on RW, needing cues for upright posture throughout and seated rest break due to fatigue. Pt continues to require cues throughout mobility for adherence to all precautions. Pt continues to benefit from skilled PT services to progress toward functional mobility goals.     If plan is discharge home, recommend the following: Two people to help with walking and/or transfers;Two people to help with bathing/dressing/bathroom;Assistance with cooking/housework;Direct supervision/assist for medications management;Direct supervision/assist for financial management;Assist for transportation;Help with stairs or ramp for entrance;Supervision due to cognitive status   Can travel by private vehicle        Equipment Recommendations  Rolling walker (2 wheels)    Recommendations for Other Services       Precautions / Restrictions Precautions Precautions: Back;Fall Precaution Booklet Issued: No Recall of Precautions/Restrictions: Impaired Precaution/Restrictions Comments: verbally reviewed  back prec Required Braces or Orthoses: Other Brace Other Brace: no brace needed per orders Restrictions Weight Bearing Restrictions Per Provider Order: No     Mobility  Bed Mobility Overal bed mobility: Needs Assistance             General bed mobility comments: pt up in chair on arrival and at end of session    Transfers Overall transfer level: Needs assistance Equipment used: Rolling walker (2 wheels) Transfers: Sit to/from Stand, Bed to chair/wheelchair/BSC Sit to Stand: Min assist, Contact guard assist           General transfer comment: cues for hand placement, slow to rise. min A to stand from bench at end of hall without armrests    Ambulation/Gait Ambulation/Gait assistance: Contact guard assist, +2 safety/equipment, Min assist Gait Distance (Feet): 200 Feet (x2) Assistive device: Rolling walker (2 wheels) Gait Pattern/deviations: Step-through pattern, Decreased stride length Gait velocity: very slowed     General Gait Details: up to min A to maintain balance, cues for closer RW proximity and upright posture   Stairs             Wheelchair Mobility     Tilt Bed    Modified Rankin (Stroke Patients Only)       Balance Overall balance assessment: Needs assistance Sitting-balance support: Feet supported Sitting balance-Leahy Scale: Good     Standing balance support: Bilateral upper extremity supported Standing balance-Leahy Scale: Fair Standing balance comment: static standing at sink without AD                            Communication Communication Communication: No apparent difficulties  Cognition Arousal: Alert Behavior During Therapy: WFL for tasks assessed/performed   PT - Cognitive impairments:  Awareness, Safety/Judgement                         Following commands: Intact      Cueing Cueing Techniques: Verbal cues  Exercises      General Comments General comments (skin integrity, edema, etc.): VSS  on RA      Pertinent Vitals/Pain Pain Assessment Pain Assessment: Faces Faces Pain Scale: Hurts even more Pain Location: R hip in sitting, headache Pain Descriptors / Indicators: Aching, Headache Pain Intervention(s): Monitored during session, Limited activity within patient's tolerance    Home Living                          Prior Function            PT Goals (current goals can now be found in the care plan section) Acute Rehab PT Goals Patient Stated Goal: to reduce pain, get outof bed PT Goal Formulation: With patient Time For Goal Achievement: 03/06/24 Progress towards PT goals: Progressing toward goals    Frequency    Min 5X/week      PT Plan      Co-evaluation              AM-PAC PT 6 Clicks Mobility   Outcome Measure  Help needed turning from your back to your side while in a flat bed without using bedrails?: A Lot Help needed moving from lying on your back to sitting on the side of a flat bed without using bedrails?: A Lot Help needed moving to and from a bed to a chair (including a wheelchair)?: A Lot Help needed standing up from a chair using your arms (e.g., wheelchair or bedside chair)?: A Little Help needed to walk in hospital room?: A Little Help needed climbing 3-5 steps with a railing? : Total 6 Click Score: 13    End of Session   Activity Tolerance: Patient tolerated treatment well Patient left: with call bell/phone within reach;in chair;with family/visitor present Nurse Communication: Mobility status PT Visit Diagnosis: Pain;Difficulty in walking, not elsewhere classified (R26.2) Pain - part of body:  (headache)     Time: 8880-8796 PT Time Calculation (min) (ACUTE ONLY): 44 min  Charges:    $Gait Training: 23-37 mins $Therapeutic Activity: 8-22 mins PT General Charges $$ ACUTE PT VISIT: 1 Visit                     Therisa R. PTA Acute Rehabilitation Services Office: [phone]   Therisa CHRISTELLA Boor 02/24/2024, 12:42  PM

## 2024-02-24 NOTE — Progress Notes (Signed)
 Pt returned to unit, no needs at this time and call bell in place.

## 2024-02-25 DIAGNOSIS — R519 Headache, unspecified: Secondary | ICD-10-CM

## 2024-02-25 DIAGNOSIS — K5900 Constipation, unspecified: Secondary | ICD-10-CM

## 2024-02-25 DIAGNOSIS — R7989 Other specified abnormal findings of blood chemistry: Secondary | ICD-10-CM

## 2024-02-25 DIAGNOSIS — I10 Essential (primary) hypertension: Secondary | ICD-10-CM

## 2024-02-25 DIAGNOSIS — G9782 Other postprocedural complications and disorders of nervous system: Secondary | ICD-10-CM | POA: Diagnosis not present

## 2024-02-25 DIAGNOSIS — R000 Tachycardia, unspecified: Secondary | ICD-10-CM

## 2024-02-25 LAB — CBC WITH DIFFERENTIAL/PLATELET
Abs Immature Granulocytes: 0.3 K/uL — ABNORMAL HIGH (ref 0.00–0.07)
Basophils Absolute: 0.1 K/uL (ref 0.0–0.1)
Basophils Relative: 1 %
Eosinophils Absolute: 0.3 K/uL (ref 0.0–0.5)
Eosinophils Relative: 2 %
HCT: 29.6 % — ABNORMAL LOW (ref 36.0–46.0)
Hemoglobin: 10.2 g/dL — ABNORMAL LOW (ref 12.0–15.0)
Immature Granulocytes: 3 %
Lymphocytes Relative: 17 %
Lymphs Abs: 2 K/uL (ref 0.7–4.0)
MCH: 31.7 pg (ref 26.0–34.0)
MCHC: 34.5 g/dL (ref 30.0–36.0)
MCV: 91.9 fL (ref 80.0–100.0)
Monocytes Absolute: 1.1 K/uL — ABNORMAL HIGH (ref 0.1–1.0)
Monocytes Relative: 9 %
Neutro Abs: 8 K/uL — ABNORMAL HIGH (ref 1.7–7.7)
Neutrophils Relative %: 68 %
Platelets: 468 K/uL — ABNORMAL HIGH (ref 150–400)
RBC: 3.22 MIL/uL — ABNORMAL LOW (ref 3.87–5.11)
RDW: 13.6 % (ref 11.5–15.5)
WBC: 11.7 K/uL — ABNORMAL HIGH (ref 4.0–10.5)
nRBC: 0 % (ref 0.0–0.2)

## 2024-02-25 LAB — COMPREHENSIVE METABOLIC PANEL WITH GFR
ALT: 60 U/L — ABNORMAL HIGH (ref 0–44)
AST: 57 U/L — ABNORMAL HIGH (ref 15–41)
Albumin: 2.4 g/dL — ABNORMAL LOW (ref 3.5–5.0)
Alkaline Phosphatase: 55 U/L (ref 38–126)
Anion gap: 10 (ref 5–15)
BUN: 14 mg/dL (ref 8–23)
CO2: 24 mmol/L (ref 22–32)
Calcium: 9 mg/dL (ref 8.9–10.3)
Chloride: 103 mmol/L (ref 98–111)
Creatinine, Ser: 0.78 mg/dL (ref 0.44–1.00)
GFR, Estimated: >60 mL/min (ref >60)
Glucose, Bld: 140 mg/dL — ABNORMAL HIGH (ref 70–99)
Potassium: 4.5 mmol/L (ref 3.5–5.1)
Sodium: 137 mmol/L (ref 135–145)
Total Bilirubin: 0.6 mg/dL (ref 0.0–1.2)
Total Protein: 5.6 g/dL — ABNORMAL LOW (ref 6.5–8.1)

## 2024-02-25 LAB — CULTURE, BLOOD (ROUTINE X 2): Culture: NO GROWTH

## 2024-02-25 MED ORDER — POLYETHYLENE GLYCOL 3350 17 G PO PACK
34.0000 g | PACK | Freq: Every day | ORAL | Status: DC
  Administered 2024-02-25 – 2024-02-26 (×2): 34 g via ORAL
  Filled 2024-02-25 (×2): qty 2

## 2024-02-25 MED ORDER — MORPHINE SULFATE 15 MG PO TABS
15.0000 mg | ORAL_TABLET | ORAL | Status: DC | PRN
  Administered 2024-02-25 – 2024-03-02 (×6): 15 mg via ORAL
  Filled 2024-02-25 (×6): qty 1

## 2024-02-25 MED ORDER — ENSURE PLUS HIGH PROTEIN PO LIQD
237.0000 mL | Freq: Three times a day (TID) | ORAL | Status: DC
  Administered 2024-02-26 – 2024-03-07 (×25): 237 mL via ORAL

## 2024-02-25 MED ORDER — GABAPENTIN 100 MG PO CAPS
200.0000 mg | ORAL_CAPSULE | Freq: Three times a day (TID) | ORAL | Status: DC
  Administered 2024-02-25 – 2024-03-08 (×34): 200 mg via ORAL
  Filled 2024-02-25 (×37): qty 2

## 2024-02-25 MED ORDER — SODIUM CHLORIDE 0.9% FLUSH: 10.0000 mL | INTRAVENOUS | Status: DC | PRN

## 2024-02-25 MED ORDER — SORBITOL 70 % SOLN
60.0000 mL | Freq: Once | Status: DC
  Filled 2024-02-25: qty 60

## 2024-02-25 MED ORDER — METOPROLOL TARTRATE 12.5 MG HALF TABLET
12.5000 mg | ORAL_TABLET | Freq: Two times a day (BID) | ORAL | Status: DC
  Administered 2024-02-25 – 2024-03-08 (×23): 12.5 mg via ORAL
  Filled 2024-02-25 (×25): qty 1

## 2024-02-25 NOTE — Progress Notes (Signed)
 Subjective:     I was asked to see the patient due to concerns of some redness of the distal part of her incision.  Since I last saw her a few days ago, she has been transferred to rehab.  She reports still discomfort in her right hip and buttock.  Her back pain has been present but fairly manageable.  She has had headaches but not severe.  Also these are not positional in nature for her.  Objective: Vital signs in last 24 hours: Temp:  [98.4 F (36.9 C)-99.1 F (37.3 C)] 98.4 F (36.9 C) (09/11 1550) Pulse Rate:  [95-120] 120 (09/11 1550) Resp:  [12-20] 12 (09/11 1550) BP: (118-160)/(62-84) 118/62 (09/11 1550) SpO2:  [94 %-100 %] 94 % (09/11 1550)  Intake/Output from previous day: No intake/output data recorded.  Labs: Recent Labs    02/24/24 0315 02/25/24 0512  WBC 11.4* 11.7*  RBC 3.17* 3.22*  HCT 29.4* 29.6*  PLT 422* 468*   Recent Labs    02/24/24 0315 02/25/24 0512  NA 137 137  K 3.9 4.5  CL 105 103  CO2 23 24  BUN 17 14  CREATININE 0.88 0.78  GLUCOSE 128* 140*  CALCIUM  8.6* 9.0   No results for input(s): LABPT, INR in the last 72 hours.  Physical Exam:  Body mass index is 37.49 kg/m.  Incision shows intact sutures.  Some redness at the distal aspect.  Is not blanching.  There is no drainage.  Some firmness is noted underneath the incision line but no frank fluctuance  Sensation     Right      Left  L3   Intact     Intact L4   Intact     Intact L5    Intact     Intact S1   Intact     Intact    Motor Exam     Right     Left Iliopsoas  5/5     5/5  Quad   5/5     5/5  Hamstring  5/5     5/5  Tibials Anterior 5/5     5/5  EHL   5/5     5/5  Gastrocs  5/5     5/5      Assessment/Plan: Status post decompression and fusion with postoperative spinal fluid leak with dural repair  From orthopedic standpoint, C-spine is doing reasonly well.  Her incision appears to be healing nicely.  There is a little redness but me this does not show signs  of true infection or cellulitis.  She is still having antibiotic so just observe this.  There is some slight swelling below the incision but not severe again.  Some I would just try to observe this.  She does have some slight headaches but again not positional and will think these are spinal in nature.  Her pain is improving and she is doing somewhat better with therapy.  She will have her continue with therapy.  With regards to the antibiotics, we will continue per ID.  She does have a PICC line placed    Cordella Rhein, MD, MS [facility] [phone]

## 2024-02-25 NOTE — Plan of Care (Signed)
  Problem: Consults Goal: RH GENERAL PATIENT EDUCATION Description: See Patient Education module for education specifics. Outcome: Progressing   Problem: RH BOWEL ELIMINATION Goal: RH STG MANAGE BOWEL WITH ASSISTANCE Description: STG Manage Bowel with Assistance. Outcome: Progressing   Problem: RH BLADDER ELIMINATION Goal: RH STG MANAGE BLADDER WITH ASSISTANCE Description: STG Manage Bladder With  mod I Assistance Outcome: Progressing   Problem: RH SKIN INTEGRITY Goal: RH STG SKIN FREE OF INFECTION/BREAKDOWN Description: Manage skin free of infection with mod I assistance Outcome: Progressing   Problem: RH SAFETY Goal: RH STG ADHERE TO SAFETY PRECAUTIONS W/ASSISTANCE/DEVICE Description: STG Adhere to Safety Precautions With  mod I Assistance/Device. Outcome: Progressing   Problem: RH PAIN MANAGEMENT Goal: RH STG PAIN MANAGED AT OR BELOW PT'S PAIN GOAL Description: < 4 w/ prns  Outcome: Progressing   Problem: RH KNOWLEDGE DEFICIT GENERAL Goal: RH STG INCREASE KNOWLEDGE OF SELF CARE AFTER HOSPITALIZATION Description: Manage increase knowledge deficits f self care after hospitalization with  mod I assistance from spouse using educational materials provided Outcome: Progressing   Problem: Coping: Goal: Ability to adjust to condition or change in health will improve Outcome: Progressing   Problem: Nutritional: Goal: Maintenance of adequate nutrition will improve Outcome: Progressing   Problem: Skin Integrity: Goal: Risk for impaired skin integrity will decrease Outcome: Progressing

## 2024-02-25 NOTE — Progress Notes (Signed)
 Inpatient Rehabilitation Care Coordinator Assessment and Plan Patient Details  Name: Diane Watkins MRN: [id number] Date of Birth: 18-Jan-1951  Today's Date: 02/25/2024  Hospital Problems: Principal Problem:   Postoperative CSF leak  Past Medical History:  Past Medical History:  Diagnosis Date   Arthritis    Diabetes mellitus without complication (HCC)    High cholesterol    History of hiatal hernia    Hyperlipidemia    Hypertension    Past Surgical History:  Past Surgical History:  Procedure Laterality Date   BIOPSY  07/24/2021   Procedure: BIOPSY;  Surgeon: Golda Claudis PENNER, MD;  Location: [facility];  Service: Endoscopy;;  polyp   CATARACT EXTRACTION W/PHACO Left 03/29/2018   Procedure: CATARACT EXTRACTION PHACO AND INTRAOCULAR LENS PLACEMENT (IOC);  Surgeon: Perley Hamilton, MD;  Location: [facility];  Service: Ophthalmology;  Laterality: Left;  CDE: 10.13   CHOLECYSTECTOMY     COLONOSCOPY WITH PROPOFOL  N/A 07/24/2021   Procedure: COLONOSCOPY WITH PROPOFOL ;  Surgeon: Golda Claudis PENNER, MD;  [facility]: [facility];  Service: Endoscopy;  Laterality: N/A;   ESOPHAGOGASTRODUODENOSCOPY (EGD) WITH PROPOFOL  N/A 07/24/2021   Procedure: ESOPHAGOGASTRODUODENOSCOPY (EGD) WITH PROPOFOL ;  Surgeon: Golda Claudis PENNER, MD;  [facility]: [facility];  Service: Endoscopy;  Laterality: N/A;   HARDWARE REMOVAL N/A 02/17/2024   Procedure: REMOVAL, HARDWARE;  Surgeon: Reyne Cordella SQUIBB, MD;  Location: [facility];  Service: Orthopedics;  Laterality: N/A;   INCISIONAL HERNIA REPAIR N/A 09/04/2021   Procedure: HERNIA REPAIR INCISIONAL W/MESH;  Surgeon: Mavis Anes, MD;  Location: [facility];  Service: General;  Laterality: N/A;   POLYPECTOMY  07/24/2021   Procedure: POLYPECTOMY;  Surgeon: Golda Claudis PENNER, MD;  Location: [facility];  Service: Endoscopy;;   TRANSFORAMINAL LUMBAR INTERBODY FUSION (TLIF) WITH PEDICLE SCREW FIXATION 1 LEVEL N/A 02/08/2024   Procedure: TRANSFORAMINAL LUMBAR INTERBODY FUSION (TLIF) WITH  PEDICLE SCREW FIXATION 1 LEVEL;  Surgeon: Reyne Cordella SQUIBB, MD;  Location: [facility];  Service: Orthopedics;  Laterality: N/A;  Transforaminal lumber interbody fusion with pedicle screw fixation, lumbar 4-lumbar 5   TUBAL LIGATION     WOUND EXPLORATION N/A 02/17/2024   Procedure: WOUND EXPLORATION;  Surgeon: Reyne Cordella SQUIBB, MD;  Location: [facility];  Service: Orthopedics;  Laterality: N/A;  Exploration of wound, Repair of Spinal fluid Leak Lumbar four-five  Removal of screws and Replacement of same screws   Social History:  reports that she has never smoked. She has been exposed to tobacco smoke. She has never used smokeless tobacco. She reports that she does not currently use alcohol . She reports that she does not use drugs.  Family / Support Systems Marital Status: Married Patient Roles: Spouse, Parent, Other (Comment) (grandmother) Spouse/Significant Other: Marcey [phone] Children: Linzie [phone] Has a son also Other Supports: Julie-daughter in-law [phone]  Dianne-sister [phone] Anticipated Caregiver: Marcey Clarity daughter in-law and Stephanie-daughter Ability/Limitations of Caregiver: Marcey has health issues of his own but can be there, Clarity during the day and daughter in the evenings Caregiver Availability: 24/7 Family Dynamics: Close with children and extended family, between all of them they can meet her needs and will for the short term. Pt hopes to be on the road to recovery. They do have church members and neighbors who will check on them  Social History Preferred language: English Religion: Primitive Baptist Cultural Background: NA Education: HS Health Literacy - How often do you need to have someone help you when you read instructions, pamphlets, or other written material from your doctor  or pharmacy?: Never Writes: Yes Employment Status: Retired Marine scientist Issues: NA Guardian/Conservator: None-according to MD pt is at times capable of making her own  decisions. Will look to her husband and include family if any decisions need to be made while here   Abuse/Neglect Abuse/Neglect Assessment Can Be Completed: Yes Physical Abuse: Denies Verbal Abuse: Denies Sexual Abuse: Denies Exploitation of patient/patient's resources: Denies Self-Neglect: Denies  Patient response to: Social Isolation - How often do you feel lonely or isolated from those around you?: Never  Emotional Status Pt's affect, behavior and adjustment status: Pt is motivated to recover and get back to independent  level. She knows this will take time but hopes by being here she will be further ahead when she goes home. She does not want to burden her husband or family Recent Psychosocial Issues: other health issues-complication from surgery Psychiatric History: No history seems to be coping appropriately and is taking each day at a time. She may benefit from seeing neuro-psych while here Substance Abuse History: NA  Patient / Family Perceptions, Expectations & Goals Pt/Family understanding of illness & functional limitations: Pt is able to explain her back surgery and complications of CSF leak and need to go back in to fix it. She feels this has caused her mcuh pain and now she has diziness at times, along with her headache daily. She feels she understands her medical plan going forward. 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  Community [employer] Agencies: None Premorbid Home Care/DME Agencies: Other (Comment) (rollator, tub bench, cane, riser) Transportation available at discharge: self and husband Is the patient able to respond to transportation needs?: Yes In the past 12 months, has lack of transportation kept you from medical appointments or from getting  medications?: No In the past 12 months, has lack of transportation kept you from meetings, work, or from getting things needed for daily living?: No Resource referrals recommended: Neuropsychology  Discharge Planning Living Arrangements: Spouse/significant other Support Systems: Spouse/significant other, Children, Other relatives, Friends/neighbors, Church/faith community Type of Residence: Private residence Insurance Resources: Media planner (specify) (Health Team Advantage) Financial Resources: Social Security, Family Support Financial Screen Referred: No Living Expenses: Lives with family Money Management: Patient, Spouse Does the patient have any problems obtaining your medications?: No Home Management: self and family Patient/Family Preliminary Plans: Return home with husband who has health issues and needs some assist with medications. Mliss daughter in-law will assist during the day while daughger-Stephanie works and then she will assist in the evenings. Aware being evaluated today and goals being set for stay here. Care Coordinator Barriers to Discharge: Insurance for SNF coverage, IV antibiotics Care Coordinator Anticipated Follow Up Needs: HH/OP  Clinical Impression Pleasant female who has been through a lot since first surgery. Supportive family who will assist at home. Will begin working on getting home health agency due to will have IV antibiotics until 11/1. Will await team's recommendations  Raymonde Asberry MATSU 02/25/2024, 11:17 AM

## 2024-02-25 NOTE — Progress Notes (Signed)
 Endorsing no pain at this time. In room sitting at edge of bed in the early AM. Now in recliner after therapy waiting for next session. Assisted to the bathroom. Reported this morning while ambulating sensation of lightheadedness and swaying sensation. Also, encouraged her to work on breathing skills and to work on deep breathing skills while ambulating to aide in managing pain/discomfort. Reports poor sleep due to increase pain from headache. Expresses headaches happen at anytime but while in the hospital reported mainly at night.

## 2024-02-25 NOTE — Progress Notes (Signed)
 Physical Therapy Assessment and Plan  Patient Details  Name: Diane Watkins MRN: [id number] Date of Birth: 1951-01-15  PT Diagnosis: Abnormal posture, Abnormality of gait, Difficulty walking, Edema, Low back pain, Muscle weakness, Pain in joint, and Pain in right hip Rehab Potential: Good ELOS: 7-10 days   Today's Date: 02/25/2024 PT Individual Time: [phone] PT Individual Time Calculation (min): 60 min   PT Individual Time: 1415-1430 PT Individual Time Calculation (min): 15 min    Hospital Problem: Principal Problem:   Postoperative CSF leak   Past Medical History:  Past Medical History:  Diagnosis Date   Arthritis    Diabetes mellitus without complication (HCC)    High cholesterol    History of hiatal hernia    Hyperlipidemia    Hypertension    Past Surgical History:  Past Surgical History:  Procedure Laterality Date   BIOPSY  07/24/2021   Procedure: BIOPSY;  Surgeon: Golda Claudis PENNER, MD;  [facility]: [facility];  Service: Endoscopy;;  polyp   CATARACT EXTRACTION W/PHACO Left 03/29/2018   Procedure: CATARACT EXTRACTION PHACO AND INTRAOCULAR LENS PLACEMENT (IOC);  Surgeon: Perley Hamilton, MD;  Location: [facility];  Service: Ophthalmology;  Laterality: Left;  CDE: 10.13   CHOLECYSTECTOMY     COLONOSCOPY WITH PROPOFOL  N/A 07/24/2021   Procedure: COLONOSCOPY WITH PROPOFOL ;  Surgeon: Golda Claudis PENNER, MD;  [facility];  Service: Endoscopy;  Laterality: N/A;   ESOPHAGOGASTRODUODENOSCOPY (EGD) WITH PROPOFOL  N/A 07/24/2021   Procedure: ESOPHAGOGASTRODUODENOSCOPY (EGD) WITH PROPOFOL ;  Surgeon: Golda Claudis PENNER, MD;  [facility];  Service: Endoscopy;  Laterality: N/A;   HARDWARE REMOVAL N/A 02/17/2024   Procedure: REMOVAL, HARDWARE;  Surgeon: Reyne Cordella SQUIBB, MD;  Location: [facility];  Service: Orthopedics;  Laterality: N/A;   INCISIONAL HERNIA REPAIR N/A 09/04/2021   Procedure: HERNIA REPAIR INCISIONAL W/MESH;  Surgeon: Mavis Anes, MD;  Location: [facility];  Service:  General;  Laterality: N/A;   POLYPECTOMY  07/24/2021   Procedure: POLYPECTOMY;  Surgeon: Golda Claudis PENNER, MD;  [facility];  Service: Endoscopy;;   TRANSFORAMINAL LUMBAR INTERBODY FUSION (TLIF) WITH PEDICLE SCREW FIXATION 1 LEVEL N/A 02/08/2024   Procedure: TRANSFORAMINAL LUMBAR INTERBODY FUSION (TLIF) WITH PEDICLE SCREW FIXATION 1 LEVEL;  Surgeon: Reyne Cordella SQUIBB, MD;  Location: [facility];  Service: Orthopedics;  Laterality: N/A;  Transforaminal lumber interbody fusion with pedicle screw fixation, lumbar 4-lumbar 5   TUBAL LIGATION     WOUND EXPLORATION N/A 02/17/2024   Procedure: WOUND EXPLORATION;  Surgeon: Reyne Cordella SQUIBB, MD;  Location: [facility];  Service: Orthopedics;  Laterality: N/A;  Exploration of wound, Repair of Spinal fluid Leak Lumbar four-five  Removal of screws and Replacement of same screws    Assessment & Plan Clinical Impression: Patient is a 73 y.o. year old female with history of T2DM, OA, HTN, GERD, transforaminal lumbar decompression w/fusion 02/08/24 and discharged on 02/10/24.  She reported HA at discharge and returned to ED with severe unrelenting headaches.  CT head done revealing abnormal intracranial lipid material and small new CSF density subtentorial posterior fossa with question of lipoid meningitis. MRI lumbar spine showed suggestion of CSF leak with 117 ml paraspinal muscle fluid collection at level of posterior hardware.she underwent exploration of spine with repair of CSF leak and wound wash out. Neurology consulted for persistent HA with intermittent bouts of confusion and recommended supportive care and if persistent to evaluate for another CSF leak. IVF added for hydration due to ensure CSF pressure remains normal  as well as poor intake.  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    She had developed fever on 09/05 and started on  Keflex  due to concerns of cellulitis. BC repeated revealing enterobacter colace. Nausea/vomiting felt to be due to constipation and treated with laxative. She has had right hip pain as well as issues with anxiety.  MRI hip  09/07showed no evidence of septic arthritis or osteomyelitis in pelvis or right hip, partial insertional tear right gluteus medius tendon and thickening/enhancement of rectosigmoid colon suspicious for colitis. ID consulted for input and antibiotics narrowed to cefepime  with recommendations of 8 weeks IV antibiotics from 09/07 with end date of 11/1 followed by 6 months of supression with cipro due to presence of hardware.    CT abdomen pelvis repeated last night and negative for colitis or acute findings. Intake remains poor with reports of abdominal discomfort and trying to self limit to liquids per patient. Severe pain right buttock and troc for past six months worse since injection for platelet exchange. Has had issues with disorientation at nights per DIL. Therapy has been working with patient  who is limited by HA, right hip pain, weakness and requires CGA/min assist for mobility and min to mod assist with ADL tasks. She was independent with RW prior to surgery andd CIR recommended due to functional decline. C/o right hip pain. Patient transferred to CIR on 02/24/2024 .    Patient currently requires min with mobility secondary to muscle weakness and muscle joint tightness, decreased cardiorespiratoy endurance, unbalanced muscle activation and decreased coordination, and decreased standing balance, decreased postural control, and decreased balance strategies.  Prior to hospitalization, patient was independent  with mobility and lived with Spouse in a House home.  Home access is 3Stairs to enter.  Patient will benefit from skilled PT intervention to maximize safe functional mobility, minimize fall risk, and decrease caregiver burden for planned discharge home with 24 hour supervision.   Anticipate patient will benefit from follow up HH at discharge.  PT - End of Session Activity Tolerance: Decreased this session;Tolerates 10 - 20 min activity with multiple rests Endurance Deficit: Yes Endurance Deficit Description: fatigue, generalized weakness, pain limiting gait tolerance PT Assessment Rehab Potential (ACUTE/IP ONLY): Good PT Barriers to Discharge: None PT Patient demonstrates impairments in the following area(s): Balance;Edema;Endurance;Motor;Pain;Safety;Skin Integrity PT Transfers Functional Problem(s): Bed Mobility;Bed to Chair;Car;Furniture PT Locomotion Functional Problem(s): Ambulation;Stairs PT Plan PT Intensity: Minimum of 1-2 x/day ,45 to 90 minutes PT Frequency: 5 out of 7 days PT Duration Estimated Length of Stay: 7-10 days PT Treatment/Interventions: Ambulation/gait training;Discharge planning;Functional mobility training;Psychosocial support;Therapeutic Activities;Balance/vestibular training;Disease management/prevention;Neuromuscular re-education;Skin care/wound management;Therapeutic Exercise;DME/adaptive equipment instruction;Pain management;UE/LE Strength taining/ROM;Community reintegration;Functional electrical stimulation;Patient/family education;Stair training;UE/LE Coordination activities PT Transfers Anticipated Outcome(s): mod indep PT Locomotion Anticipated Outcome(s): mod indep with LRAD PT Recommendation Recommendations for Other Services: Neuropsych consult;Therapeutic Recreation consult Therapeutic Recreation Interventions: Pet therapy;Stress management;Outing/community reintergration Follow Up Recommendations: Home health PT Patient destination: Home Equipment Recommended: To be determined 2/10 post meds, 6/10 pre med HA 6/10 LBP with sit to stand only, walking is better  PT Evaluation Precautions/Restrictions Precautions Precautions: Back;Fall Precaution Booklet Issued: No Recall of Precautions/Restrictions:  Intact Precaution/Restrictions Comments: able to verbalize 3/3 precautions Other Brace: no brace needed per orders Restrictions Weight Bearing Restrictions Per Provider Order: No  Pain 3-4/10 pain HA; 4-5/10 pain to right hip   Pain Interference Pain Interference Pain Effect on  Sleep: 1. Rarely or not at all Pain Interference with Therapy Activities: 2. Occasionally Pain Interference with Day-to-Day Activities: 3. Frequently Home Living/Prior Functioning Home Living Living Arrangements: Spouse/significant other Available Help at Discharge: Family;Available PRN/intermittently Type of Home: House Home Access: Stairs to enter [employer] of Steps: 3 Entrance Stairs-Rails: Right;Left Home Layout: Other (Comment);Full bath on main level Bathroom Shower/Tub: Engineer, manufacturing systems: Standard Bathroom Accessibility: Yes Additional Comments: was walking with RW and reports furniture walking  Lives With: Spouse Prior Function Level of Independence: Independent with basic ADLs;Independent with gait;Independent with transfers  Able to Take Stairs?: Yes Driving: Yes (short distances) Vocation: Retired Leisure: Hobbies-yes (Comment) Vision/Perception  Vision - History Ability to See in Adequate Light: 0 Adequate Perception Perception: Within Functional Limits Praxis Praxis: WFL  Cognition Overall Cognitive Status: Within Functional Limits for tasks assessed Arousal/Alertness: Suspect due to medications Memory: Appears intact Awareness: Appears intact Problem Solving: Appears intact Safety/Judgment: Appears intact Sensation Sensation Light Touch: Appears Intact Hot/Cold: Appears Intact Proprioception: Appears Intact Stereognosis: Not tested Coordination Gross Motor Movements are Fluid and Coordinated: Yes Fine Motor Movements are Fluid and Coordinated: Yes Coordination and Movement Description: weakness, pain right hip Motor  Motor Motor: Abnormal  postural alignment and control   Trunk/Postural Assessment  Cervical Assessment Cervical Assessment: Exceptions to [facility]) Thoracic Assessment Thoracic Assessment: Exceptions to [facility] Lumbar Assessment Lumbar Assessment: Exceptions to [facility] (decreased lordosis) Postural Control Postural Control: Deficits on evaluation  Balance Balance Balance Assessed: Yes Static Sitting Balance Static Sitting - Balance Support: Feet supported;Bilateral upper extremity supported Static Sitting - Level of Assistance: 5: Stand by assistance Dynamic Sitting Balance Dynamic Sitting - Balance Support: During functional activity Dynamic Sitting - Level of Assistance: 5: Stand by assistance Static Standing Balance Static Standing - Balance Support: During functional activity Static Standing - Level of Assistance: 5: Stand by assistance Dynamic Standing Balance Dynamic Standing - Balance Support: During functional activity Dynamic Standing - Level of Assistance: 4: Min assist Extremity Assessment  RUE Assessment RUE Assessment: Exceptions to [facility] Active Range of Motion (AROM) Comments: WFL General Strength Comments: overall 4-/5 LUE Assessment LUE Assessment: Exceptions to [facility] Active Range of Motion (AROM) Comments: WFL General Strength Comments: 4-/5 overall RLE Assessment RLE Assessment: Exceptions to [facility] Active Range of Motion (AROM) Comments: WNLs RLE Strength Right Hip Flexion: 4-/5 Right Hip Extension: 4-/5 Right Hip ABduction: 4-/5 Right Hip ADduction: 4+/5 Right Knee Flexion: 5/5 Right Knee Extension: 5/5 Right Ankle Dorsiflexion: 4/5 Right Ankle Plantar Flexion: 4+/5 LLE Assessment LLE Assessment: Exceptions to [facility] Active Range of Motion (AROM) Comments: WNLs LLE Strength Left Hip Flexion: 4+/5 Left Hip Extension: 4+/5 Left Hip ABduction: 4-/5 Left Hip ADduction: 5/5 Left Knee Flexion: 5/5 Left Knee Extension: 5/5 Left Ankle Dorsiflexion: 4/5 Left Ankle Plantar Flexion:  4+/5  Care Tool Care Tool Bed Mobility Roll left and right activity   Roll left and right assist level: Moderate Assistance - Patient 50 - 74%    Sit to lying activity   Sit to lying assist level: Moderate Assistance - Patient 50 - 74%    Lying to sitting on side of bed activity   Lying to sitting on side of bed assist level: the ability to move from lying on the back to sitting on the side of the bed with no back support.: Moderate Assistance - Patient 50 - 74%     Care Tool Transfers Sit to stand transfer   Sit to stand assist level: Minimal Assistance - Patient >  75%    Chair/bed transfer   Chair/bed transfer assist level: Minimal Assistance - Patient > 75%    Licensed conveyancer transfer activity did not occur: Safety/medical concerns (weakness, fatigue, pain)        Care Tool Locomotion Ambulation   Assist level: Minimal Assistance - Patient > 75% Assistive device: Walker-rolling    Walk 10 feet activity   Assist level: Minimal Assistance - Patient > 75% Assistive device: Walker-rolling   Walk 50 feet with 2 turns activity   Assist level: Minimal Assistance - Patient > 75% Assistive device: Walker-rolling  Walk 150 feet activity   Assist level: Minimal Assistance - Patient > 75% Assistive device: Walker-rolling  Walk 10 feet on uneven surfaces activity Walk 10 feet on uneven surfaces activity did not occur: Safety/medical concerns      Stairs   Assist level: Minimal Assistance - Patient > 75% Stairs assistive device: 2 hand rails Max number of stairs: 8  Walk up/down 1 step activity   Walk up/down 1 step (curb) assist level: Minimal Assistance - Patient > 75% Walk up/down 1 step or curb assistive device: 2 hand rails  Walk up/down 4 steps activity   Walk up/down 4 steps assist level: Minimal Assistance - Patient > 75% Walk up/down 4 steps assistive device: 2 hand rails  Walk up/down 12 steps activity Walk up/down 12 steps activity did not occur: Safety/medical  concerns (weakness, fatigue, pain)      Pick up small objects from floor Pick up small object from the floor (from standing position) activity did not occur: Safety/medical concerns (weakness, fatigue, pain)      Wheelchair Is the patient using a wheelchair?: Yes Type of Wheelchair: Manual   Wheelchair assist level: Total Assistance - Patient < 25%    Wheel 50 feet with 2 turns activity   Assist Level: Total Assistance - Patient < 25%  Wheel 150 feet activity   Assist Level: Total Assistance - Patient < 25%    Refer to Care Plan for Long Term Goals  SHORT TERM GOAL WEEK 1 PT Short Term Goal 1 (Week 1): =LTG  Recommendations for other services: Neuropsych and Therapeutic Recreation  Pet therapy, Stress management, and Outing/community reintegration  Skilled Therapeutic Intervention Mobility Transfers Transfers: Sit to Stand;Stand to [employer] Transfers Sit to Stand: Moderate Assistance - Patient 50-74% (from low surface, ie w/c) Stand to Sit: Contact Guard/Touching assist Stand Pivot Transfers: Contact Guard/Touching assist Transfer (Assistive device): Rolling walker Locomotion  Gait Ambulation: Yes Gait Assistance: Contact Guard/Touching assist Assistive device: Rolling walker Gait Gait: Yes Gait Pattern: Antalgic;Shuffle;Decreased stance time - left;Trendelenburg Gait velocity: very slow Stairs / Additional Locomotion Stairs: Yes Stairs Assistance: Contact Guard/Touching assist Stair Management Technique: Two rails Number of Stairs: 8 Height of Stairs: 3 Wheelchair Mobility Wheelchair Mobility: No  Evaluation completed (see details above and below) with education on PT POC and goals and individual treatment initiated with focus on bed mobility, balance, transfers, car transfers, WC mobility, and ambulation. Pt received in recliner and agrees to therapy. Reports HA, right hip pain, pt slightly out of it from pain meds. PT provides rest breaks as needed and RN  notified who provides pain meds during session. Pt completes sit->stand from recliner to RW with cues for hand positioning.  Pt wheeled to main gym for energy conservation. Pt performed stair negotiation 3 step with bilateral railings for support with CGA and step to pattern.  Cueing for sequencing.  Pt amb with RW back to  room ~x150'.  Pt demonstrates decreased loading response to RLE due to pain, decreased stride length, shuffled pattern.  Pt then transport ed back to room. Transferred to recliner with mod assist for ant wt shifting/push off, stand pivot into recliner, LE slightly elevated, all items within reach.    PT attempted second session, however, pt in bed sleeping, too tired/exhausted for therapy.  DIL and husband entered, pt requesting to use bathroom.  Pt transferred supine->sit to EOB with mod assist for trunk support.  Pt appeared flushed and warm to touch.  Nsg entered for medication distribution.  Assessed incision and noted redness to lower area of incision, Nsg agreed.  DIL assisted pt into bathroom. Pt passed off to Nsg.  Missed 60 minutes of therapy   Discharge Criteria: Patient will be discharged from PT if patient refuses treatment 3 consecutive times without medical reason, if treatment goals not met, if there is a change in medical status, if patient makes no progress towards goals or if patient is discharged from hospital.  The above assessment, treatment plan, treatment alternatives and goals were discussed and mutually agreed upon: by patient  Arland GORMAN Fast 02/25/2024, 1:01 PM

## 2024-02-25 NOTE — Plan of Care (Signed)
  Problem: RH Grooming Goal: LTG Patient will perform grooming w/assist,cues/equip (OT) Description: LTG: Patient will perform grooming with assist, with/without cues using equipment (OT) Flowsheets (Taken 02/25/2024 1231) LTG: Pt will perform grooming with assistance level of: Independent with assistive device    Problem: RH Bathing Goal: LTG Patient will bathe all body parts with assist levels (OT) Description: LTG: Patient will bathe all body parts with assist levels (OT) Flowsheets (Taken 02/25/2024 1231) LTG: Pt will perform bathing with assistance level/cueing: Independent with assistive device    Problem: RH Dressing Goal: LTG Patient will perform upper body dressing (OT) Description: LTG Patient will perform upper body dressing with assist, with/without cues (OT). Flowsheets (Taken 02/25/2024 1231) LTG: Pt will perform upper body dressing with assistance level of: Independent with assistive device Goal: LTG Patient will perform lower body dressing w/assist (OT) Description: LTG: Patient will perform lower body dressing with assist, with/without cues in positioning using equipment (OT) Flowsheets (Taken 02/25/2024 1231) LTG: Pt will perform lower body dressing with assistance level of: Independent with assistive device   Problem: RH Toileting Goal: LTG Patient will perform toileting task (3/3 steps) with assistance level (OT) Description: LTG: Patient will perform toileting task (3/3 steps) with assistance level (OT)  Flowsheets (Taken 02/25/2024 1231) LTG: Pt will perform toileting task (3/3 steps) with assistance level: Independent with assistive device   Problem: RH Toilet Transfers Goal: LTG Patient will perform toilet transfers w/assist (OT) Description: LTG: Patient will perform toilet transfers with assist, with/without cues using equipment (OT) Flowsheets (Taken 02/25/2024 1231) LTG: Pt will perform toilet transfers with assistance level of: Independent with assistive  device   Problem: RH Tub/Shower Transfers Goal: LTG Patient will perform tub/shower transfers w/assist (OT) Description: LTG: Patient will perform tub/shower transfers with assist, with/without cues using equipment (OT) Flowsheets (Taken 02/25/2024 1231) LTG: Pt will perform tub/shower stall transfers with assistance level of: Independent with assistive device

## 2024-02-25 NOTE — Progress Notes (Addendum)
 PROGRESS NOTE   Subjective/Complaints: Patient reports continued headache at night, improved overall since she had CSF leak repair.  Feels constipated has not had a bowel movement in a few days.  Nausea overnight but improved this morning.  Denies chest pain, shortness of breath, abdominal pain, new vision changes, new motor or sensory changes + Constipation + headache mostly at night Nausea and vomiting this morning, currently improved Objective:   CT ABDOMEN PELVIS WO CONTRAST Result Date: 02/23/2024 CLINICAL DATA:  Abdominal pain, postop EXAM: CT ABDOMEN AND PELVIS WITHOUT CONTRAST TECHNIQUE: Multidetector CT imaging of the abdomen and pelvis was performed following the standard protocol without IV contrast. RADIATION DOSE REDUCTION: This exam was performed according to the departmental dose-optimization program which includes automated exposure control, adjustment of the mA and/or kV according to patient size and/or use of iterative reconstruction technique. COMPARISON:  09/27/2020 FINDINGS: Lower chest: No acute findings Hepatobiliary: No focal liver abnormality is seen. Status post cholecystectomy. No biliary dilatation. Pancreas: No focal abnormality or ductal dilatation. Spleen: No focal abnormality.  Normal size. Adrenals/Urinary Tract: No adrenal abnormality. No focal renal abnormality. No stones or hydronephrosis. Urinary bladder is unremarkable. Stomach/Bowel: Sigmoid diverticulosis. No active diverticulitis. Stomach and small bowel decompressed, unremarkable. Vascular/Lymphatic: Aortic atherosclerosis. No evidence of aneurysm or adenopathy. Reproductive: Uterus and adnexa unremarkable.  No mass. Other: No free fluid or free air. Small umbilical hernia containing fat. Evidence of prior repair of right supraumbilical ventral hernia. There appears to be a small recurrent hernia containing fat in this region. Musculoskeletal: No acute bony  abnormality. IMPRESSION: No acute findings in the abdomen or pelvis. Aortic atherosclerosis. Small umbilical hernia containing fat. Prior repair of fat containing right supraumbilical ventral hernia with small hernia recurrence containing fat. Electronically Signed   By: Franky Crease M.D.   On: 02/23/2024 23:44   US  EKG SITE RITE Result Date: 02/23/2024 If Site Rite image not attached, placement could not be confirmed due to current cardiac rhythm.  Recent Labs    02/24/24 0315 02/25/24 0512  WBC 11.4* 11.7*  HGB 9.8* 10.2*  HCT 29.4* 29.6*  PLT 422* 468*   Recent Labs    02/24/24 0315 02/25/24 0512  NA 137 137  K 3.9 4.5  CL 105 103  CO2 23 24  GLUCOSE 128* 140*  BUN 17 14  CREATININE 0.88 0.78  CALCIUM  8.6* 9.0   No intake or output data in the 24 hours ending 02/25/24 1305      Physical Exam: Vital Signs Blood pressure (!) 156/80, pulse (!) 104, temperature 99.1 F (37.3 C), resp. rate 18, height 5' 2 (1.575 m), weight 93 kg, SpO2 99%.  General: No apparent distress, fatigued appearing HEENT: Head is normocephalic, atraumatic, sclera anicteric, oral mucosa pink and moist Neck: Supple without JVD or lymphadenopathy Heart: Reg rate and rhythm. No murmurs rubs or gallops Chest: CTA bilaterally without wheezes, rales, or rhonchi; no distress Abdomen: Soft, non-tender, mildly-distended, bowel sounds positive. Extremities: No clubbing, cyanosis, or edema.  Psych: Flat affect Skin: Clean and intact without signs of breakdown Neuro:  Flat affect. Proptosis noted. Able to follow simple motor commands. 5/5 strength except for RLE which is limited  by hip pain.  Sensation intact in bilateral legs to light touch      Assessment/Plan: 1. Functional deficits which require 3+ hours per day of interdisciplinary therapy in a comprehensive inpatient rehab setting. Physiatrist is providing close team supervision and 24 hour management of active medical problems listed  below. Physiatrist and rehab team continue to assess barriers to discharge/monitor patient progress toward functional and medical goals  Care Tool:  Bathing              Bathing assist Assist Level: Contact Guard/Touching assist     Upper Body Dressing/Undressing Upper body dressing        Upper body assist Assist Level: Supervision/Verbal cueing    Lower Body Dressing/Undressing Lower body dressing            Lower body assist Assist for lower body dressing: Contact Guard/Touching assist     Toileting Toileting    Toileting assist Assist for toileting: Contact Guard/Touching assist     Transfers Chair/bed transfer  Transfers assist     Chair/bed transfer assist level: Minimal Assistance - Patient > 75%     Locomotion Ambulation   Ambulation assist      Assist level: Minimal Assistance - Patient > 75% Assistive device: Walker-rolling     Walk 10 feet activity   Assist     Assist level: Minimal Assistance - Patient > 75% Assistive device: Walker-rolling   Walk 50 feet activity   Assist    Assist level: Minimal Assistance - Patient > 75% Assistive device: Walker-rolling    Walk 150 feet activity   Assist    Assist level: Minimal Assistance - Patient > 75% Assistive device: Walker-rolling    Walk 10 feet on uneven surface  activity   Assist Walk 10 feet on uneven surfaces activity did not occur: Safety/medical concerns         Wheelchair     Assist Is the patient using a wheelchair?: Yes Type of Wheelchair: Manual    Wheelchair assist level: Total Assistance - Patient < 25%      Wheelchair 50 feet with 2 turns activity    Assist        Assist Level: Total Assistance - Patient < 25%   Wheelchair 150 feet activity     Assist      Assist Level: Total Assistance - Patient < 25%   Blood pressure (!) 156/80, pulse (!) 104, temperature 99.1 F (37.3 C), resp. rate 18, height 5' 2 (1.575 m),  weight 93 kg, SpO2 99%.  Medical Problem List and Plan: 1. Functional deficits secondary to postoperative CSF leak             -patient may shower             -ELOS/Goals: 9-12 days modI             -Continue CIR   2.  Antithrombotics: -DVT/anticoagulation:  Pharmaceutical: Lovenox              -antiplatelet therapy: N/A   3. Pain Management: Fioricet  and hydrocodone  prn for pain. Kpad ordered             --gabapentin  added at nights for hip pain             --Voltaren  gel added to right hip for local measures.   - 9/11 currently has tramadol  and hydrocodone  as needed for pain, metoprolol  started in may also benefit for headaches 4. Mood/Behavior/Sleep: LCSW to follow for evaluation  and support.              --Monitor sleep hygiene. Has had issues with insomnia. Will continue Xanax  prn for anxiety             -antipsychotic agents: N/A 5. Neuropsych/cognition: This patient may be intermittently capable of making decisions on her own behalf.   6. Skin/Wound Care:  Routine pressure relief measures.    7. Fluids/Electrolytes/Nutrition: Monitor I/O. Encourage fluid intake.   8. 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 Vitamin C supplement started   9. T2DM: Hgb A1C-7.0. d/c CBGs checks and ISS since well controlled  CBG (last 3)  Recent Labs    02/23/24 1816 02/24/24 0811 02/24/24 1211  GLUCAP 110* 122* 118*      10. Leucocytosis improving from rise to 17.7-->11.4.   11. 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    12. Anemia of chronic illness: Recheck CBC in am. Hgb trending down from 10.7-->9.8.             --monitor for signs of bleeding.    13.  Abnormal LFTs: Recheck in am.   -9/11 improved today to AST 57, ALT 60   14. Rectosigmoid colitis?: Monitor intake. On Senna S.  GIP negative.     15. 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    16. Tachycardia: magnesium  supplement started  - 9/11 mild intermittent tachycardia, may be pain related.  See #17   17. HTN: magnesium  supplement started  -9/11 metoprolol  12.5 mg twice daily    02/25/2024    4:25 AM 02/24/2024    8:55 PM 02/24/2024    2:47 PM  Vitals with BMI  Height   5' 2   5' 2  Weight   205 lbs   205 lbs  BMI   37.49   37.49  Systolic 156 134 845   154  Diastolic 80 84 84   84  Pulse 895 95 109   109   18.  Constipation.  - 9/11 initially planning sorbitol  but will change to MiraLAX  34 g   Addendum: surgery notified some slight redness lower part of incision, also gabapentin  increased to 200mg  TID  LOS: 1 days A FACE TO FACE EVALUATION WAS PERFORMED  Murray Collier 02/25/2024, 1:05 PM

## 2024-02-25 NOTE — Progress Notes (Signed)
 Physical Therapy Session Note  Patient Details  Name: Diane Watkins MRN: [id number] Date of Birth: May 05, 1951  Today's Date: 02/25/2024 PT Individual Time: 8953-8886 PT Individual Time Calculation (min): 27 min   Short Term Goals: Week 1:     Skilled Therapeutic Interventions/Progress Updates:      Pt sitting up in recliner - reports moderate levels of nausea - RN aware who provided IV nausea medicine at start of session. Pt denies any resting pain, but has pain evolving to 5/10 in R hip with mobility.   Sit<>Stand to RW with light minA from recliner height. She ambulates with CGA and RW from her room to ortho gym, ~34ft. Gait speed very slow and cautious, relies heavily on RW support for balance.   Goal to setup on Nustep to work on conditioning and strengthening. She needed minA for positioning and scooting herself backwards. Once positioned, she was having high levels of RLE pain that couldn't be managed on Nustep so deferred further efforts.   Patient returned to her room via w/c given pain and fatigue. Ambulatory transfer using the RW with CGA from w/c to bed. Pt left sitting EOB with her spouse present, bed alarm on.   Therapy Documentation Precautions:  Restrictions Weight Bearing Restrictions Per Provider Order: No General:     Therapy/Group: Individual Therapy  Valla Pacey P Victor Granados 02/25/2024, 7:45 AM

## 2024-02-25 NOTE — Progress Notes (Signed)
 Inpatient Rehabilitation Center Individual Statement of Services  Patient Name:  ANNIA GOMM  Date:  02/25/2024  Welcome to the [facility]patient Rehabilitation [facility].  Our goal is to provide you with an individualized program based on your diagnosis and situation, designed to meet your specific needs.  With this comprehensive rehabilitation program, you will be expected to participate in at least 3 hours of rehabilitation therapies Monday-Friday, with modified therapy programming on the weekends.  Your rehabilitation program will include the following services:  Physical Therapy (PT), Occupational Therapy (OT), 24 hour per day rehabilitation nursing, Therapeutic Recreaction (TR), Neuropsychology, Care Coordinator, Rehabilitation Medicine, Nutrition Services, and Pharmacy Services  Weekly team conferences will be held on Tuesday to discuss your progress.  Your Inpatient Rehabilitation Care Coordinator will talk with you frequently to get your input and to update you on team discussions.  Team conferences with you and your family in attendance may also be held.  Expected length of stay: 7-10 days  Overall anticipated outcome: independent with device  Depending on your progress and recovery, your program may change. Your Inpatient Rehabilitation Care Coordinator will coordinate services and will keep you informed of any changes. Your Inpatient Rehabilitation Care Coordinator's name and contact numbers are listed  below.  The following services may also be recommended but are not provided by the Inpatient Rehabilitation Center:  Driving Evaluations Home Health Rehabiltiation Services Outpatient Rehabilitation Services    Arrangements will be made to provide these services after discharge if needed.  Arrangements include referral to agencies that provide these services.  Your insurance has been verified to be:  [employer] Your primary doctor is:  Jayce Cook  Pertinent information will  be shared with your doctor and your insurance company.  Inpatient Rehabilitation Care Coordinator:  Rhoda Clement, KEN [phone] or ELIGAH BASQUES  Information discussed with and copy given to patient by: Clement Asberry MATSU, 02/25/2024, 11:19 AM

## 2024-02-25 NOTE — Progress Notes (Signed)
 Inpatient Rehabilitation  Patient information reviewed and entered into eRehab system by [facility]. Karen Kays., CCC/SLP, PPS Coordinator.  Information including medical coding, functional ability and quality indicators will be reviewed and updated through discharge.

## 2024-02-25 NOTE — Progress Notes (Signed)
 Inpatient Rehabilitation Admission Medication Review by a Pharmacist   A complete drug regimen review was completed for this patient to identify any potential clinically significant medication issues.   High Risk Drug Classes Is patient taking? Indication by Medication  Antipsychotic Yes, as an intravenous medication Compazine  - nausea  Anticoagulant Yes Lovenox  - VTE prophylaxis  Antibiotic Yes, as an intravenous medication Cefepime  - cloacae bacteremia/lumbar hardware infection (end date 04/14/24)  Opioid Yes Hydrocodone /APAP-- severe pain Tramadol  -moderate pain  Antiplatelet No    Hypoglycemics/insulin  No    Vasoactive Medication Yes Lisinopril  - HTN  Chemotherapy No    Other Yes Alprazolam  - anxiety Artificial tears - dry eyes Cyclobenzaprine  - muscle spasms Diclofenac  topical gel - pain Diphenhydramine  - itching Gabapentin  - neuropathic pain Loratidine - allergies Lovaza /Crestor  - cholesterol fleet enema , bisacody - constipation Maalox- indigestion Robitussin- cough Vitamin B12, Vit B complex w/Vit C, magnesium  gluconate-supplements        Type of Medication Issue Identified Description of Issue Recommendation(s)  Drug Interaction(s) (clinically significant)        Duplicate Therapy        Allergy        No Medication Administration End Date        Incorrect Dose        Additional Drug Therapy Needed        Significant med changes from prior encounter (inform family/care partners about these prior to discharge). PTA iburprofen not continued on acute inpatient or CIR encounter.   Loratidine substituted for PTA cetirizine per hospital policy. Communicate relevant medication changes to patient/family members at discharge from CIR.    Restart or discontinue PTA meds not resumed in CIR at discharge if clinically indicated.   Other            Clinically significant medication issues were identified that warrant physician communication and completion of  prescribed/recommended actions by midnight of the next day:  No   Name of provider notified for urgent issues identified:    Provider Method of Notification:      Pharmacist comments:    Time spent performing this drug regimen review (minutes):  30   Diane Watkins, [place] Clinical Pharmacist 02/25/2024 11:10 AM

## 2024-02-25 NOTE — Evaluation (Signed)
 Occupational Therapy Assessment and Plan  Patient Details  Name: Diane Watkins MRN: [id number] Date of Birth: [birth year]  OT Diagnosis: abnormal posture, acute pain, lumbago (low back pain), and muscle weakness (generalized) Rehab Potential: Rehab Potential (ACUTE ONLY): Good ELOS: 7-10 days   Today's Date: 02/25/2024 OT Individual Time: 0800-0900 OT Individual Time Calculation (min): 60 min     Hospital Problem: Principal Problem:   Postoperative CSF leak   Past Medical History:  Past Medical History:  Diagnosis Date   Arthritis    Diabetes mellitus without complication (HCC)    High cholesterol    History of hiatal hernia    Hyperlipidemia    Hypertension    Past Surgical History:  Past Surgical History:  Procedure Laterality Date   BIOPSY  07/24/2021   Procedure: BIOPSY;  Surgeon: Golda Claudis PENNER, MD;  [facility]: [facility];  Service: Endoscopy;;  polyp   CATARACT EXTRACTION W/PHACO Left 03/29/2018   Procedure: CATARACT EXTRACTION PHACO AND INTRAOCULAR LENS PLACEMENT (IOC);  Surgeon: Perley Hamilton, MD;  Location: [facility];  Service: Ophthalmology;  Laterality: Left;  CDE: 10.13   CHOLECYSTECTOMY     COLONOSCOPY WITH PROPOFOL  N/A 07/24/2021   Procedure: COLONOSCOPY WITH PROPOFOL ;  Surgeon: Golda Claudis PENNER, MD;  [facility]: [facility];  Service: Endoscopy;  Laterality: N/A;   ESOPHAGOGASTRODUODENOSCOPY (EGD) WITH PROPOFOL  N/A 07/24/2021   Procedure: ESOPHAGOGASTRODUODENOSCOPY (EGD) WITH PROPOFOL ;  Surgeon: Golda Claudis PENNER, MD;  [facility];  Service: Endoscopy;  Laterality: N/A;   HARDWARE REMOVAL N/A 02/17/2024   Procedure: REMOVAL, HARDWARE;  Surgeon: Reyne Cordella SQUIBB, MD;  Location: [facility];  Service: Orthopedics;  Laterality: N/A;   INCISIONAL HERNIA REPAIR N/A 09/04/2021   Procedure: HERNIA REPAIR INCISIONAL W/MESH;  Surgeon: Mavis Anes, MD;  Location: [facility];  Service: General;  Laterality: N/A;   POLYPECTOMY  07/24/2021   Procedure: POLYPECTOMY;  Surgeon:  Golda Claudis PENNER, MD;  [facility]: [facility];  Service: Endoscopy;;   TRANSFORAMINAL LUMBAR INTERBODY FUSION (TLIF) WITH PEDICLE SCREW FIXATION 1 LEVEL N/A 02/08/2024   Procedure: TRANSFORAMINAL LUMBAR INTERBODY FUSION (TLIF) WITH PEDICLE SCREW FIXATION 1 LEVEL;  Surgeon: Reyne Cordella SQUIBB, MD;  Location: [facility];  Service: Orthopedics;  Laterality: N/A;  Transforaminal lumber interbody fusion with pedicle screw fixation, lumbar 4-lumbar 5   TUBAL LIGATION     WOUND EXPLORATION N/A 02/17/2024   Procedure: WOUND EXPLORATION;  Surgeon: Reyne Cordella SQUIBB, MD;  Location: [facility];  Service: Orthopedics;  Laterality: N/A;  Exploration of wound, Repair of Spinal fluid Leak Lumbar four-five  Removal of screws and Replacement of same screws    Assessment & Plan Clinical Impression: Patient is a Diane Watkins. Loud is a 73 year old female with history of T2DM, OA, HTN, GERD, transforaminal lumbar decompression w/fusion 02/08/24 and discharged on 02/10/24. She reported HA at discharge and returned to ED with severe unrelenting headaches.  CT head done revealing abnormal intracranial lipid material and small new CSF density subtentorial posterior fossa with question of lipoid meningitis. MRI lumbar spine showed suggestion of CSF leak with 117 ml paraspinal muscle fluid collection at level of posterior hardware.she underwent exploration of spine with repair of CSF leak and wound wash out. Neurology consulted for persistent HA with intermittent bouts of confusion and recommended supportive care and if persistent to evaluate for another CSF leak. IVF added for hydration due to ensure CSF pressure remains normal as well as poor intake.  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    She had developed fever on 09/05 and started on Keflex  due to concerns of cellulitis. BC repeated revealing enterobacter  colace. Nausea/vomiting felt to be due to constipation and treated with laxative. She has had right hip pain as well as issues with anxiety.  MRI hip  09/07showed no evidence of septic arthritis or osteomyelitis in pelvis or right hip, partial insertional tear right gluteus medius tendon and thickening/enhancement of rectosigmoid colon suspicious for colitis. ID consulted for input and antibiotics narrowed to cefepime  with recommendations of 8 weeks IV antibiotics from 09/07 with end date of 11/1 followed by 6 months of supression with cipro due to presence of hardware.    CT abdomen pelvis repeated last night and negative for colitis or acute findings. Intake remains poor with reports of abdominal discomfort and trying to self limit to liquids per patient. Severe pain right buttock and troc for past six months worse since injection for platelet exchange. Has had issues with disorientation at nights per DIL. Therapy has been working with patient  who is limited by HA, right hip pain, weakness and requires CGA/min assist for mobility and min to mod assist with ADL tasks. She was independent with RW prior to surgery andd CIR recommended due to functional decline. C/o right hip pain. Patient transferred to CIR on 02/24/2024 .    Patient currently requires min with basic self-care skills secondary to muscle weakness, decreased cardiorespiratoy endurance, and decreased standing balance, decreased postural control, and decreased balance strategies.  Prior to hospitalization, patient could complete ADLs with modified independent .  Patient will benefit from skilled intervention to decrease level of assist with basic self-care skills and increase independence with basic self-care skills prior to discharge home with care partner.  Anticipate patient will require intermittent supervision and follow up home health.  OT - End of Session Activity Tolerance: Improving Endurance Deficit: Yes OT Assessment Rehab  Potential (ACUTE ONLY): Good OT Barriers to Discharge: None OT Patient demonstrates impairments in the following area(s): Balance;Pain;Safety;Endurance OT Basic ADL's Functional Problem(s): Grooming;Bathing;Dressing;Toileting OT Advanced ADL's Functional Problem(s): None OT Transfers Functional Problem(s): Toilet;Tub/Shower OT Additional Impairment(s): None OT Plan OT Intensity: Minimum of 1-2 x/day, 45 to 90 minutes OT Frequency: 5 out of 7 days OT Duration/Estimated Length of Stay: 7-10 days OT Treatment/Interventions: Balance/vestibular training;Community reintegration;Discharge planning;Disease mangement/prevention;DME/adaptive equipment instruction;Functional mobility training;Neuromuscular re-education;Pain management;Patient/family education;Psychosocial support;Self Care/advanced ADL retraining;Skin care/wound managment;Functional electrical stimulation;Therapeutic Activities;Splinting/orthotics;Therapeutic Exercise;UE/LE Coordination activities;Wheelchair propulsion/positioning;UE/LE Strength taining/ROM OT Self Feeding Anticipated Outcome(s): mod I OT Basic Self-Care Anticipated Outcome(s): mod I OT Toileting Anticipated Outcome(s): mod I OT Bathroom Transfers Anticipated Outcome(s): mod I OT Recommendation Recommendations for Other Services: Therapeutic Recreation consult Therapeutic Recreation Interventions: Pet therapy Patient destination: Home Follow Up Recommendations: Home health OT Equipment Recommended: To be determined   OT Evaluation Precautions/Restrictions  Precautions Precautions: Back;Fall Recall of Precautions/Restrictions: Impaired Precaution/Restrictions Comments: verbally reviewed back prec, able to recall 2/3 Other Brace: no brace needed per orders Restrictions Weight Bearing Restrictions Per Provider Order: No General Chart Reviewed: Yes Response to Previous Treatment: Not applicable Family/Caregiver Present: No Pain  No pain reported Home  Living/Prior Functioning Home Living Family/patient expects to be discharged to:: Private residence Living Arrangements: Spouse/significant other Available Help at Discharge: Family, Available PRN/intermittently Type of Home: House Home Access: Stairs to enter [employer] of Steps: 3 Entrance Stairs-Rails: Right, Left (cant reach both, pt reporting one kind of wobbly)  Home Layout: Other (Comment), Full bath on main level (one level although bedroom is down 3 steps, pt reporting sleeps on couch) Bathroom Shower/Tub: Engineer, manufacturing systems: Standard Bathroom Accessibility: Yes Additional Comments: was walking with RW and reports furniture walking  Lives With: Spouse IADL History Homemaking Responsibilities: Yes Homemaking Comments: started doing less when pain got bad ~5 mos ago Current License: Yes Occupation: Retired Advertising account planner: gardening Prior Function Level of Independence: Independent with basic ADLs, Independent with gait, Independent with transfers  Able to Take Stairs?: Yes Driving: Yes Vocation: Retired Leisure: Hobbies-yes (Comment) Vision Baseline Vision/History: 0 No visual deficits;1 Wears glasses Wears Glasses: Reading only Ability to See in Adequate Light: 0 Adequate Patient Visual Report: No change from baseline Vision Assessment?: No apparent visual deficits Perception  Perception: Within Functional Limits Praxis Praxis: WFL Cognition Cognition Overall Cognitive Status: Within Functional Limits for tasks assessed Arousal/Alertness: Awake/alert Orientation Level: Person;Place;Situation Person: Oriented Place: Oriented Situation: Oriented Memory: Appears intact Awareness: Appears intact Problem Solving: Appears intact Safety/Judgment: Appears intact Brief Interview for Mental Status (BIMS) Repetition of Three Words (First Attempt): 3 Temporal Orientation: Year: Correct Temporal Orientation: Month: Accurate within 5  days Temporal Orientation: Day: Correct Recall: Sock: Yes, no cue required Recall: Blue: Yes, no cue required Recall: Bed: Yes, no cue required BIMS Summary Score: 15 Sensation Sensation Light Touch: Appears Intact Hot/Cold: Appears Intact Proprioception: Appears Intact Stereognosis: Not tested Coordination Gross Motor Movements are Fluid and Coordinated: No Fine Motor Movements are Fluid and Coordinated: No Coordination and Movement Description: 2/2 weakness Motor  Motor Motor: Abnormal postural alignment and control  Trunk/Postural Assessment  Cervical Assessment Cervical Assessment: Exceptions to [facility] (forward head) Thoracic Assessment Thoracic Assessment: Exceptions to [facility] (rounded shoulder) Lumbar Assessment Lumbar Assessment: Exceptions to [facility] (posterior pelvic tilt) Postural Control Postural Control: Deficits on evaluation (delayed)  Balance Balance Balance Assessed: Yes Static Sitting Balance Static Sitting - Balance Support: Feet supported;Bilateral upper extremity supported Static Sitting - Level of Assistance: 5: Stand by assistance Dynamic Sitting Balance Dynamic Sitting - Balance Support: During functional activity Dynamic Sitting - Level of Assistance: 5: Stand by assistance Static Standing Balance Static Standing - Balance Support: During functional activity Static Standing - Level of Assistance: 4: Min assist (CGA) Dynamic Standing Balance Dynamic Standing - Balance Support: During functional activity Dynamic Standing - Level of Assistance: 4: Min assist (CGA) Extremity/Trunk Assessment RUE Assessment RUE Assessment: Exceptions to [facility] Active Range of Motion (AROM) Comments: WFL General Strength Comments: overall 4-/5 LUE Assessment LUE Assessment: Exceptions to [facility] Active Range of Motion (AROM) Comments: WFL General Strength Comments: 4-/5 overall  Care Tool Care Tool Self Care Eating   Eating Assist Level: Set up assist    Oral Care     Oral Care Assist Level: Supervision/Verbal cueing    Bathing         Assist Level: Contact Guard/Touching assist    Upper Body Dressing(including orthotics)       Assist Level: Supervision/Verbal cueing    Lower Body Dressing (excluding footwear)     Assist for lower body dressing: Contact Guard/Touching assist    Putting on/Taking off footwear     Assist for footwear: Moderate Assistance - Patient 50 - 74%       Care Tool Toileting Toileting activity   Assist for toileting: Contact Guard/Touching assist     Care Tool Bed Mobility Roll left and right activity        Sit to lying activity  Lying to sitting on side of bed activity         Care Tool Transfers Sit to stand transfer   Sit to stand assist level: Minimal Assistance - Patient > 75%    Chair/bed transfer   Chair/bed transfer assist level: Minimal Assistance - Patient > 75%     Toilet transfer   Assist Level: Minimal Assistance - Patient > 75%     Care Tool Cognition  Expression of Ideas and Wants Expression of Ideas and Wants: 4. Without difficulty (complex and basic) - expresses complex messages without difficulty and with speech that is clear and easy to understand  Understanding Verbal and Non-Verbal Content Understanding Verbal and Non-Verbal Content: 4. Understands (complex and basic) - clear comprehension without cues or repetitions   Memory/Recall Ability Memory/Recall Ability : Current season;Location of own room;Staff names and faces;That he or she is in a hospital/hospital unit   Refer to Care Plan for Long Term Goals  SHORT TERM GOAL WEEK 1 OT Short Term Goal 1 (Week 1): STG=LTG d/t ELOS  Recommendations for other services: Therapeutic Recreation  Pet therapy   Skilled Therapeutic Intervention ADL ADL Eating: Set up Grooming: Supervision/safety Where Assessed-Grooming: Standing at sink Upper Body Bathing: Supervision/safety Lower Body Bathing: Minimal assistance Upper  Body Dressing: Supervision/safety Lower Body Dressing: Minimal assistance Where Assessed-Lower Body Dressing: Edge of bed Toileting: Contact guard Where Assessed-Toileting: Teacher, adult education: Furniture conservator/restorer Method: Insurance claims handler: Unable to assess Tub/Shower Transfer Method: Unable to assess Film/video editor: Unable to assess ADL Comments: decreased endurance Mobility  Transfers Sit to Stand: Contact Guard/Touching assist Stand to Sit: Contact Guard/Touching assist   1:1 evaluation and treatment session initiated this date. OT roles, goals and purpose discussed with pt as well as therapy schedule. ADL completed this date with levels of assist listed above. Pt would benefit from skilled OT in IPR setting in order to maximize independence with ADLs upon D/C.   Discharge Criteria: Patient will be discharged from OT if patient refuses treatment 3 consecutive times without medical reason, if treatment goals not met, if there is a change in medical status, if patient makes no progress towards goals or if patient is discharged from hospital.  The above assessment, treatment plan, treatment alternatives and goals were discussed and mutually agreed upon: by patient  Camie Hoe, OTD, OTR/L 02/25/2024, 12:40 PM

## 2024-02-26 ENCOUNTER — Inpatient Hospital Stay (HOSPITAL_COMMUNITY)

## 2024-02-26 DIAGNOSIS — G9782 Other postprocedural complications and disorders of nervous system: Secondary | ICD-10-CM | POA: Diagnosis not present

## 2024-02-26 DIAGNOSIS — R000 Tachycardia, unspecified: Secondary | ICD-10-CM | POA: Diagnosis not present

## 2024-02-26 DIAGNOSIS — T148XXA Other injury of unspecified body region, initial encounter: Secondary | ICD-10-CM

## 2024-02-26 DIAGNOSIS — K5900 Constipation, unspecified: Secondary | ICD-10-CM | POA: Diagnosis not present

## 2024-02-26 DIAGNOSIS — I10 Essential (primary) hypertension: Secondary | ICD-10-CM | POA: Diagnosis not present

## 2024-02-26 LAB — CBC WITH DIFFERENTIAL/PLATELET
Abs Immature Granulocytes: 0.48 K/uL — ABNORMAL HIGH (ref 0.00–0.07)
Basophils Absolute: 0.1 K/uL (ref 0.0–0.1)
Basophils Relative: 1 %
Eosinophils Absolute: 0.2 K/uL (ref 0.0–0.5)
Eosinophils Relative: 1 %
HCT: 29.1 % — ABNORMAL LOW (ref 36.0–46.0)
Hemoglobin: 9.7 g/dL — ABNORMAL LOW (ref 12.0–15.0)
Immature Granulocytes: 4 %
Lymphocytes Relative: 16 %
Lymphs Abs: 2.1 K/uL (ref 0.7–4.0)
MCH: 31.4 pg (ref 26.0–34.0)
MCHC: 33.3 g/dL (ref 30.0–36.0)
MCV: 94.2 fL (ref 80.0–100.0)
Monocytes Absolute: 1 K/uL (ref 0.1–1.0)
Monocytes Relative: 8 %
Neutro Abs: 8.9 K/uL — ABNORMAL HIGH (ref 1.7–7.7)
Neutrophils Relative %: 70 %
Platelets: 548 K/uL — ABNORMAL HIGH (ref 150–400)
RBC: 3.09 MIL/uL — ABNORMAL LOW (ref 3.87–5.11)
RDW: 13.8 % (ref 11.5–15.5)
WBC: 12.7 K/uL — ABNORMAL HIGH (ref 4.0–10.5)
nRBC: 0 % (ref 0.0–0.2)

## 2024-02-26 MED ORDER — LACTULOSE 10 GM/15ML PO SOLN
20.0000 g | Freq: Once | ORAL | Status: AC
  Administered 2024-02-26: 20 g via ORAL
  Filled 2024-02-26: qty 30

## 2024-02-26 MED ORDER — FLEET ENEMA RE ENEM: 1.0000 | ENEMA | Freq: Once | RECTAL | Status: DC

## 2024-02-26 MED ORDER — POLYETHYLENE GLYCOL 3350 17 G PO PACK: 34.0000 g | PACK | Freq: Once | ORAL | Status: DC

## 2024-02-26 NOTE — Progress Notes (Signed)
 Occupational Therapy Session Note  Patient Details  Name: Diane Watkins MRN: [id number] Date of Birth: 09-Dec-1950  Today's Date: 02/26/2024 OT Individual Time: 1330-1400 OT Individual Time Calculation (min): 30 min  and Today's Date: 02/26/2024 OT Missed Time: 30 Minutes Missed Time Reason: Other (comment) (eating lunch)   Short Term Goals: Week 1:  OT Short Term Goal 1 (Week 1): STG=LTG d/t ELOS  Skilled Therapeutic Interventions/Progress Updates:    Pt eating lunch with IV running upon arrival. OTA allowed pt to finish eating lunch and returned later. Pt missed 30 mins skilled OT services. Pt expressed desire to take a shower but IV running. Pt also requested female OT to assist with personal care. Scheduler notifed. Reviewed home setup and home safety recommendations. Reviewed LTGs of mod I. Pt already has TTB and was able to use it x2 before recent hospitalization. Pt reports she will sleep on sofa when she returns home. Pt remained in recliner with all needs within reach.   Therapy Documentation Precautions:  Precautions Precautions: Back, Fall Precaution Booklet Issued: No Recall of Precautions/Restrictions: Intact Precaution/Restrictions Comments: able to verbalize 3/3 precautions Other Brace: no brace needed per orders Restrictions Weight Bearing Restrictions Per Provider Order: No General: General OT Amount of Missed Time: 30 Minutes Pain: Pt reports her HA has improved Therapy/Group: Individual Therapy  Maritza Debby Mare 02/26/2024, 2:02 PM

## 2024-02-26 NOTE — Progress Notes (Signed)
 Gabapentin  100 mg dropped to floor. Wasted two pills and placed in the medication waste bin. Nurse Ulla Elder observed writer placing medication in disposable medication container.

## 2024-02-26 NOTE — Plan of Care (Signed)
  Problem: Consults Goal: RH GENERAL PATIENT EDUCATION Description: See Patient Education module for education specifics. Outcome: Progressing   Problem: RH BLADDER ELIMINATION Goal: RH STG MANAGE BLADDER WITH ASSISTANCE Description: STG Manage Bladder With  mod I Assistance Outcome: Progressing   Problem: RH SKIN INTEGRITY Goal: RH STG SKIN FREE OF INFECTION/BREAKDOWN Description: Manage skin free of infection with mod I assistance Outcome: Progressing   Problem: RH SAFETY Goal: RH STG ADHERE TO SAFETY PRECAUTIONS W/ASSISTANCE/DEVICE Description: STG Adhere to Safety Precautions With  mod I Assistance/Device. Outcome: Progressing   Problem: RH PAIN MANAGEMENT Goal: RH STG PAIN MANAGED AT OR BELOW PT'S PAIN GOAL Description: < 4 w/ prns  Outcome: Progressing   Problem: Education: Goal: Ability to describe self-care measures that may prevent or decrease complications (Diabetes Survival Skills Education) will improve Outcome: Progressing Goal: Individualized Educational Video(s) Outcome: Progressing   Problem: Coping: Goal: Ability to adjust to condition or change in health will improve Outcome: Progressing   Problem: Fluid Volume: Goal: Ability to maintain a balanced intake and output will improve Outcome: Progressing   Problem: Health Behavior/Discharge Planning: Goal: Ability to identify and utilize available resources and services will improve Outcome: Progressing Goal: Ability to manage health-related needs will improve Outcome: Progressing   Problem: Metabolic: Goal: Ability to maintain appropriate glucose levels will improve Outcome: Progressing   Problem: Nutritional: Goal: Maintenance of adequate nutrition will improve Outcome: Progressing Goal: Progress toward achieving an optimal weight will improve Outcome: Progressing   Problem: Skin Integrity: Goal: Risk for impaired skin integrity will decrease Outcome: Progressing   Problem: Tissue  Perfusion: Goal: Adequacy of tissue perfusion will improve Outcome: Progressing

## 2024-02-26 NOTE — Progress Notes (Signed)
 Physical Therapy Session Note  Patient Details  Name: Diane Watkins MRN: [id number] Date of Birth: 1951/01/12  Today's Date: 02/26/2024 PT Individual Time: 9151-9064 PT Individual Time Calculation (min): 47 min   Short Term Goals: Week 1:  PT Short Term Goal 1 (Week 1): =LTG  Skilled Therapeutic Interventions/Progress Updates:    Pt sitting upright at EOB when PT arrived, pt reports her HA, right hip pain is better.  Feeling better than yesterday! Her son put Voltaren  cream on her hip with heat and states it helped quite a bit.  Pt hfeeling nauseous this AM with emesis, Nsg present for medication distribution. Dr Murray entered to speak with patient.  Pt agreeable for session.    Session focusing on mobility, increasing bowel stimulation.  Pt amb with RW from room to Chad doors to main gym with CGA/Supervision, cueing to relax her shoulder esp when fatigued.  Performed standing horse shoe toss with RW, and without RW for balance, coordination.   Stair negotiation 2x 8 steps with bilateral railing for support with CGA.  Pt educated on proper sequencing, step to pattern.  Pt verbalized understanding.   Continued with ambulation x 252, extended rest in w/c.  Pt amb back to room, transferred to recliner, pt comfortable at end of session with heating pad to hips/low back, LE's elevated and all items within reach.  Pt demonstrating improved overall mobility, decreased antalgic gait pattern, slightly increased cadence and step length.  Pt had no c/o nausea during session, had moderate gas.    Missed  minutes due to Nsg and pt nausea.  Pt presents alert and awake today.    Therapy Documentation Precautions:  Precautions Precautions: Back, Fall Precaution Booklet Issued: No Recall of Precautions/Restrictions: Intact Precaution/Restrictions Comments: able to verbalize 3/3 precautions Other Brace: no brace needed per orders Restrictions Weight Bearing Restrictions Per Provider Order:  No  Pain: 0/10     Therapy/Group: Individual Therapy  Arland GORMAN Fast 02/26/2024, 11:58 AM

## 2024-02-26 NOTE — Progress Notes (Signed)
 Reporting stomach discomfort this morning. Acknowledges having one episode of emesis this AM after trying to eat her breakfast this morning. Denies any pain this morning. Antiemetic medication given to patient this morning. Reports pain cream is helping with right hip pain.

## 2024-02-26 NOTE — Progress Notes (Signed)
 Patient has started to have serosanguinous drainage from inferior aspect of incision. Honeycomb dressing wet with drainage noted on gown and pillow. Swelling has resolved but able to express blood tinged drainage from area about 1 cm from lower aspect and to the right. Cleansed with vashe and ABD pad applied. Will alert surgeon. Patient feels better overall, slept well and no HA since last  night. Will repeat CBC today.

## 2024-02-26 NOTE — Plan of Care (Signed)
  Problem: Consults Goal: RH GENERAL PATIENT EDUCATION Description: See Patient Education module for education specifics. Outcome: Progressing   Problem: RH BOWEL ELIMINATION Goal: RH STG MANAGE BOWEL WITH ASSISTANCE Description: STG Manage Bowel with Assistance. Outcome: Progressing   Problem: RH BLADDER ELIMINATION Goal: RH STG MANAGE BLADDER WITH ASSISTANCE Description: STG Manage Bladder With  mod I Assistance Outcome: Progressing   Problem: RH SKIN INTEGRITY Goal: RH STG SKIN FREE OF INFECTION/BREAKDOWN Description: Manage skin free of infection with mod I assistance Outcome: Progressing   Problem: RH SAFETY Goal: RH STG ADHERE TO SAFETY PRECAUTIONS W/ASSISTANCE/DEVICE Description: STG Adhere to Safety Precautions With  mod I Assistance/Device. Outcome: Progressing   Problem: RH PAIN MANAGEMENT Goal: RH STG PAIN MANAGED AT OR BELOW PT'S PAIN GOAL Description: < 4 w/ prns  Outcome: Progressing   Problem: RH KNOWLEDGE DEFICIT GENERAL Goal: RH STG INCREASE KNOWLEDGE OF SELF CARE AFTER HOSPITALIZATION Description: Manage increase knowledge deficits f self care after hospitalization with  mod I assistance from spouse using educational materials provided Outcome: Progressing   Problem: Education: Goal: Ability to describe self-care measures that may prevent or decrease complications (Diabetes Survival Skills Education) will improve Outcome: Progressing Goal: Individualized Educational Video(s) Outcome: Progressing   Problem: Coping: Goal: Ability to adjust to condition or change in health will improve Outcome: Progressing   Problem: Fluid Volume: Goal: Ability to maintain a balanced intake and output will improve Outcome: Progressing   Problem: Health Behavior/Discharge Planning: Goal: Ability to identify and utilize available resources and services will improve Outcome: Progressing Goal: Ability to manage health-related needs will improve Outcome: Progressing    Problem: Metabolic: Goal: Ability to maintain appropriate glucose levels will improve Outcome: Progressing   Problem: Nutritional: Goal: Maintenance of adequate nutrition will improve Outcome: Progressing Goal: Progress toward achieving an optimal weight will improve Outcome: Progressing   Problem: Skin Integrity: Goal: Risk for impaired skin integrity will decrease Outcome: Progressing   Problem: Tissue Perfusion: Goal: Adequacy of tissue perfusion will improve Outcome: Progressing

## 2024-02-26 NOTE — Progress Notes (Signed)
 Physical Therapy Session Note  Patient Details  Name: Diane Watkins MRN: [id number] Date of Birth: October 01, 1950  Today's Date: 02/26/2024 PT Individual Time: 1004-1030 PT Individual Time Calculation (min): 26 min   Short Term Goals: Week 1:  PT Short Term Goal 1 (Week 1): =LTG  Skilled Therapeutic Interventions/Progress Updates:    Pt received seated in WC and agrees to therapy. Reports pain in both hip R>L but says that pain is much better than yesterday. Pt performs sit to stand with CGA and cues for hand placement. Pt ambulates x100' to gym with RW and cues for upright posture and increasing stride length. PT notes upon arriving to gym that pt has bloody drainage around area of sacrum. Pt ambulates back to room with RW and performs sit to Lt sidelying with modA management of BLEs. RN alerted and present to change pt's sacral dressing. Pt returns to supine with cues for positioning. Pt then ambulates x250' with RW and cues to decrease WB through RW foe energy conservation and improved body mechanics. Pt left seated in recliner with all needs within reach.   Therapy Documentation Precautions:  Precautions Precautions: Back, Fall Precaution Booklet Issued: No Recall of Precautions/Restrictions: Intact Precaution/Restrictions Comments: able to verbalize 3/3 precautions Other Brace: no brace needed per orders Restrictions Weight Bearing Restrictions Per Provider Order: No General: PT Amount of Missed Time (min): 13 Minutes PT Missed Treatment Reason: Nursing care;Patient ill (Comment) (nausea)   Therapy/Group: Individual Therapy  Elsie JAYSON Dawn, PT, DPT 02/26/2024, 3:57 PM

## 2024-02-26 NOTE — Progress Notes (Signed)
 Physical Therapy Session Note  Patient Details  Name: Diane Watkins MRN: [id number] Date of Birth: February 13, 1951  Today's Date: 02/26/2024 PT Individual Time: 1127-1210 PT Individual Time Calculation (min): 43 min   Short Term Goals: Week 1:  PT Short Term Goal 1 (Week 1): =LTG  Skilled Therapeutic Interventions/Progress Updates: Pt presented in recliner agreeable to therapy. Pt denies pain during session. Vitals checked due to yellow mews earlier BP 110/65 HR 86. Pt ambulated ~72ft with RW and CGA. Pt transported remaining distance to day room. Participated in toe taps to 6in step with use of RW, then performed in 2in step with HHA. Pt also participated in gait around 4W nsg station ~135ft with CGA and RW, pt ambulating at decreased self selected gait speed but did not require any rest breaks. Pt indicating HA present, after discussion pt indicating HA frequently present but not significant. Pt also discussed hasn't been eating/drinking. Provided education on improtance of nutrition to maintain energy levels. Pt agreeable to try milk. Pt transported back to room and completed stand step transfer to recliner. Pt left in recliner at end of session with call bell within reach and current needs met.      Therapy Documentation Precautions:  Precautions Precautions: Back, Fall Precaution Booklet Issued: No Recall of Precautions/Restrictions: Intact Precaution/Restrictions Comments: able to verbalize 3/3 precautions Other Brace: no brace needed per orders Restrictions Weight Bearing Restrictions Per Provider Order: No General:   Vital Signs: Therapy Vitals Temp: 98.9 F (37.2 C) Temp Source: Oral BP: 110/65 Patient Position (if appropriate): Sitting Pain: Pain Assessment Pain Scale: 0-10 Pain Score: 0-No pain   Therapy/Group: Individual Therapy  Diane Watkins 02/26/2024, 1:14 PM

## 2024-02-26 NOTE — Progress Notes (Signed)
 PROGRESS NOTE   Subjective/Complaints: Patient continues to have intermittent nausea.  Headache is improved today.  Denies chest pain, shortness of breath, abdominal pain, new vision changes, new motor or sensory changes + Constipation + headache mostly at night Nausea and vomiting this morning, currently improved Objective:   No results found.  Recent Labs    02/25/24 0512 02/26/24 1219  WBC 11.7* 12.7*  HGB 10.2* 9.7*  HCT 29.6* 29.1*  PLT 468* 548*   Recent Labs    02/24/24 0315 02/25/24 0512  NA 137 137  K 3.9 4.5  CL 105 103  CO2 23 24  GLUCOSE 128* 140*  BUN 17 14  CREATININE 0.88 0.78  CALCIUM  8.6* 9.0    Intake/Output Summary (Last 24 hours) at 02/26/2024 1759 Last data filed at 02/26/2024 9266 Gross per 24 hour  Intake 59 ml  Output --  Net 59 ml        Physical Exam: Vital Signs Blood pressure 115/60, pulse 100, temperature 98.5 F (36.9 C), temperature source Oral, resp. rate 16, height 5' 2 (1.575 m), weight 93 kg, SpO2 99%.  General: No apparent distress, fatigued appearing HEENT: Head is normocephalic, atraumatic, sclera anicteric, oral mucosa pink and moist Neck: Supple without JVD or lymphadenopathy Heart: Reg rate and rhythm. No murmurs rubs or gallops Chest: CTA bilaterally without wheezes, rales, or rhonchi; no distress Abdomen: Soft, non-tender, mildly-distended, bowel sounds positive. Extremities: No clubbing, cyanosis, or edema.  Psych: Flat affect Skin: Surgical incision with honeycomb dressing, small amount of serosanguineous drainage bottom portion Neuro:  Flat affect. Proptosis noted. Able to follow simple motor commands. 5/5 strength except for RLE which is limited by hip pain.  Sensation intact in bilateral legs to light touch      Assessment/Plan: 1. Functional deficits which require 3+ hours per day of interdisciplinary therapy in a comprehensive inpatient rehab  setting. Physiatrist is providing close team supervision and 24 hour management of active medical problems listed below. Physiatrist and rehab team continue to assess barriers to discharge/monitor patient progress toward functional and medical goals  Care Tool:  Bathing              Bathing assist Assist Level: Contact Guard/Touching assist     Upper Body Dressing/Undressing Upper body dressing        Upper body assist Assist Level: Supervision/Verbal cueing    Lower Body Dressing/Undressing Lower body dressing            Lower body assist Assist for lower body dressing: Contact Guard/Touching assist     Toileting Toileting    Toileting assist Assist for toileting: Contact Guard/Touching assist     Transfers Chair/bed transfer  Transfers assist     Chair/bed transfer assist level: Minimal Assistance - Patient > 75%     Locomotion Ambulation   Ambulation assist      Assist level: Minimal Assistance - Patient > 75% Assistive device: Walker-rolling     Walk 10 feet activity   Assist     Assist level: Minimal Assistance - Patient > 75% Assistive device: Walker-rolling   Walk 50 feet activity   Assist    Assist level: Minimal Assistance -  Patient > 75% Assistive device: Walker-rolling    Walk 150 feet activity   Assist    Assist level: Minimal Assistance - Patient > 75% Assistive device: Walker-rolling    Walk 10 feet on uneven surface  activity   Assist Walk 10 feet on uneven surfaces activity did not occur: Safety/medical concerns         Wheelchair     Assist Is the patient using a wheelchair?: Yes Type of Wheelchair: Manual    Wheelchair assist level: Total Assistance - Patient < 25%      Wheelchair 50 feet with 2 turns activity    Assist        Assist Level: Total Assistance - Patient < 25%   Wheelchair 150 feet activity     Assist      Assist Level: Total Assistance - Patient < 25%    Blood pressure 115/60, pulse 100, temperature 98.5 F (36.9 C), temperature source Oral, resp. rate 16, height 5' 2 (1.575 m), weight 93 kg, SpO2 99%.  Medical Problem List and Plan: 1. Functional deficits secondary to postoperative CSF leak             -patient may shower             -ELOS/Goals: 9-12 days modI             -Continue CIR  - 9/12 patient was seen by surgeon yesterday due to some redness at her incision, not felt to be true infection.  - In the afternoon patient was noted to have some increased serosanguineous drainage from inferior portion of incision.  Surgeon was notified to review, continue to monitor   2.  Antithrombotics: -DVT/anticoagulation:  Pharmaceutical: Lovenox              -antiplatelet therapy: N/A   3. Pain Management: Fioricet  and hydrocodone  prn for pain. Kpad ordered             --gabapentin  added at nights for hip pain             --Voltaren  gel added to right hip for local measures.   - 9/11 currently has tramadol  and hydrocodone  as needed for pain, metoprolol  started in may also benefit for headaches  -9/11 headaches improved continue current regimen 4. Mood/Behavior/Sleep: LCSW to follow for evaluation and support.              --Monitor sleep hygiene. Has had issues with insomnia. Will continue Xanax  prn for anxiety             -antipsychotic agents: N/A 5. Neuropsych/cognition: This patient may be intermittently capable of making decisions on her own behalf.   6. Skin/Wound Care:  Routine pressure relief measures.    7. Fluids/Electrolytes/Nutrition: Monitor I/O. Encourage fluid intake.   8. 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 Vitamin C supplement started   9. T2DM: Hgb A1C-7.0. d/c CBGs checks and ISS since well controlled  CBG (last 3)  Recent Labs    02/23/24 1816 02/24/24 0811 02/24/24 1211  GLUCAP 110* 122* 118*      10. Leucocytosis improving  from rise to 17.7-->11.4.  - WBC a little higher at 12.7 continue to monitor   11. 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    12.  Anemia of chronic illness: Recheck CBC in am. Hgb trending down from 10.7-->9.8.             --monitor for signs of bleeding.   - Hemoglobin overall stable continue to follow   13.  Abnormal LFTs: Recheck in am.   -9/11 improved today to AST 57, ALT 60   14. Rectosigmoid colitis?: Monitor intake. On Senna S. GIP negative.   - 9/12 will try to avoid stimulant laxatives    15. 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    16. Tachycardia: magnesium  supplement started  - 9/11 mild intermittent tachycardia, may be pain related.  See #17  -9/12 intermittent mild tachycardia, continue current regimen and monitor for now   17. HTN: magnesium  supplement started  -9/11 metoprolol  12.5 mg twice daily  - 9/12 BP fair control overall, continue current regimen and monitor    02/26/2024    1:34 PM 02/26/2024   11:00 AM 02/26/2024    8:17 AM  Vitals with BMI  Systolic 115 110 875  Diastolic 60 65 78  Pulse 100  890   18.  Constipation.  - 9/11 MiraLAX  34 g daily  -9/12 continue MiraLAX , will add lactulose .  Avoid stimulant laxatives when possible.  Will check abdomen x-ray to assess for stool burden   LOS: 2 days A FACE TO FACE EVALUATION WAS PERFORMED  Murray Collier 02/26/2024, 5:59 PM

## 2024-02-27 DIAGNOSIS — G9782 Other postprocedural complications and disorders of nervous system: Secondary | ICD-10-CM | POA: Diagnosis not present

## 2024-02-27 DIAGNOSIS — G9600 Cerebrospinal fluid leak, unspecified: Secondary | ICD-10-CM | POA: Diagnosis not present

## 2024-02-27 LAB — IRON AND TIBC
Iron: 35 ug/dL (ref 28–170)
Saturation Ratios: 20 % (ref 10.4–31.8)
TIBC: 172 ug/dL — ABNORMAL LOW (ref 250–450)
UIBC: 137 ug/dL

## 2024-02-27 LAB — BASIC METABOLIC PANEL WITH GFR
Anion gap: 9 (ref 5–15)
BUN: 12 mg/dL (ref 8–23)
CO2: 25 mmol/L (ref 22–32)
Calcium: 8.8 mg/dL — ABNORMAL LOW (ref 8.9–10.3)
Chloride: 103 mmol/L (ref 98–111)
Creatinine, Ser: 0.74 mg/dL (ref 0.44–1.00)
GFR, Estimated: >60 mL/min (ref >60)
Glucose, Bld: 118 mg/dL — ABNORMAL HIGH (ref 70–99)
Potassium: 4.5 mmol/L (ref 3.5–5.1)
Sodium: 137 mmol/L (ref 135–145)

## 2024-02-27 LAB — OCCULT BLOOD X 1 CARD TO LAB, STOOL
Fecal Occult Bld: NEGATIVE
Fecal Occult Bld: NEGATIVE
Fecal Occult Bld: NEGATIVE

## 2024-02-27 LAB — CBC
HCT: 26.5 % — ABNORMAL LOW (ref 36.0–46.0)
Hemoglobin: 8.8 g/dL — ABNORMAL LOW (ref 12.0–15.0)
MCH: 31.3 pg (ref 26.0–34.0)
MCHC: 33.2 g/dL (ref 30.0–36.0)
MCV: 94.3 fL (ref 80.0–100.0)
Platelets: 477 K/uL — ABNORMAL HIGH (ref 150–400)
RBC: 2.81 MIL/uL — ABNORMAL LOW (ref 3.87–5.11)
RDW: 13.8 % (ref 11.5–15.5)
WBC: 11.3 K/uL — ABNORMAL HIGH (ref 4.0–10.5)
nRBC: 0 % (ref 0.0–0.2)

## 2024-02-27 LAB — FERRITIN: Ferritin: 213 ng/mL (ref 11–307)

## 2024-02-27 MED ORDER — DOCUSATE SODIUM 100 MG PO CAPS
200.0000 mg | ORAL_CAPSULE | Freq: Two times a day (BID) | ORAL | Status: DC
  Administered 2024-02-27 – 2024-03-03 (×9): 200 mg via ORAL
  Filled 2024-02-27 (×10): qty 2

## 2024-02-27 MED ORDER — POLYETHYLENE GLYCOL 3350 17 G PO PACK
17.0000 g | PACK | Freq: Every day | ORAL | Status: DC
  Administered 2024-02-27 – 2024-03-08 (×9): 17 g via ORAL
  Filled 2024-02-27 (×11): qty 1

## 2024-02-27 MED ORDER — LACTULOSE 10 GM/15ML PO SOLN
20.0000 g | Freq: Every day | ORAL | Status: DC
  Administered 2024-02-27 – 2024-03-06 (×8): 20 g via ORAL
  Filled 2024-02-27 (×9): qty 30

## 2024-02-27 NOTE — IPOC Note (Signed)
 Overall Plan of Care [facility]) Patient Details Name: Diane Watkins MRN: [id number] DOB: 08-23-1950  Admitting Diagnosis: Postoperative CSF leak  Hospital Problems: Principal Problem:   Postoperative CSF leak     Functional Problem List: Nursing Bladder, Bowel, Endurance, Edema, Medication Management, Pain, Safety, Skin Integrity  PT Balance, Edema, Endurance, Motor, Pain, Safety, Skin Integrity  OT Balance, Pain, Safety, Endurance  SLP    TR         Basic ADL's: OT Grooming, Bathing, Dressing, Toileting     Advanced  ADL's: OT None     Transfers: PT Bed Mobility, Bed to Chair, Car, Occupational psychologist, Research scientist (life sciences): PT Ambulation, Stairs     Additional Impairments: OT None  SLP        TR      Anticipated Outcomes Item Anticipated Outcome  Self Feeding mod I  Swallowing      Basic self-care  mod I  Toileting  mod I   Bathroom Transfers mod I  Bowel/Bladder  maintain continence of bowel and bladder  Transfers  mod indep  Locomotion  mod indep with LRAD  Communication     Cognition     Pain  <4 w/ prns  Safety/Judgment  Manage safety with mod Iassistance   Therapy Plan: PT Intensity: Minimum of 1-2 x/day ,45 to 90 minutes PT Frequency: 5 out of 7 days PT Duration Estimated Length of Stay: 7-10 days OT Intensity: Minimum of 1-2 x/day, 45 to 90 minutes OT Frequency: 5 out of 7 days OT Duration/Estimated Length of Stay: 7-10 days     Team Interventions: Nursing Interventions Patient/Family Education, Bladder Management, Bowel Management, Disease Management/Prevention, Pain Management, Discharge Planning, Skin Care/Wound Management  PT interventions Ambulation/gait training, Discharge planning, Functional mobility training, Psychosocial support, Therapeutic Activities, Balance/vestibular training, Disease management/prevention, Neuromuscular re-education, Skin care/wound management, Therapeutic Exercise, DME/adaptive equipment  instruction, Pain management, UE/LE Strength taining/ROM, Community reintegration, Development worker, international aid stimulation, Patient/family education, Museum/gallery curator, UE/LE Coordination activities  OT Interventions Warden/ranger, Firefighter, Discharge planning, Disease mangement/prevention, Fish farm manager, Functional mobility training, Neuromuscular re-education, Pain management, Patient/family education, Psychosocial support, Self Care/advanced ADL retraining, Skin care/wound managment, Functional electrical stimulation, Therapeutic Activities, Splinting/orthotics, Therapeutic Exercise, UE/LE Coordination activities, Wheelchair propulsion/positioning, UE/LE Strength taining/ROM  SLP Interventions    TR Interventions    SW/CM Interventions Discharge Planning, Psychosocial Support, Patient/Family Education   Barriers to Discharge MD  Medical stability  Nursing Decreased caregiver support, IV antibiotics Discharge: House  Discharge Home Layout: One level  Discharge Home Access: Stairs to enter  Entrance Stairs-Rails: Right, Left  Entrance Stairs-Number of Steps: 4  PT None    OT None    SLP      SW Insurance for SNF coverage, IV antibiotics     Team Discharge Planning: Destination: PT-Home ,OT- Home , SLP-  Projected Follow-up: PT-Home health PT, OT-  Home health OT, SLP-  Projected Equipment Needs: PT-To be determined, OT- To be determined, SLP-  Equipment Details: PT- , OT-  Patient/family involved in discharge planning: PT- Patient,  OT-Patient, Family member/caregiver, SLP-   MD ELOS: 7-10 Medical Rehab Prognosis:  Excellent Assessment: The patient has been admitted for CIR therapies with the diagnosis of postoperative CSF leak . The team will be addressing functional mobility, strength, stamina, balance, safety, adaptive techniques and equipment, self-care, bowel and bladder mgt, patient and caregiver education. Goals have been set at Mod I.  Anticipated discharge destination is home.  See Team Conference Notes for weekly updates to the plan of care

## 2024-02-27 NOTE — Progress Notes (Signed)
 PROGRESS NOTE   Subjective/Complaints: No events overnight.  Patient reports ongoing drainage from her low back, but is otherwise feeling well.  No headaches, nausea, vision changes, fevers, or chills. Vitals stable with some mild hypertension this morning    02/27/2024    4:53 AM 02/26/2024    9:29 PM 02/26/2024    6:37 PM  Vitals with BMI  Systolic 146 144 884  Diastolic 70 78 60  Pulse 92 104     Recent Labs    02/24/24 1211  GLUCAP 118*     P.o. intakes appropriate  Continent of bladder  Last BM recorded 9-7; states she had 1 this a.m.  Denies chest pain, shortness of breath, abdominal pain, new vision changes, new motor or sensory changes + Constipation --improved + headache mostly at night--improved + Nausea and vomiting -improved  Objective:   DG Abd 2 Views Result Date: 02/26/2024 CLINICAL DATA:  History of constipation EXAM: ABDOMEN - 2 VIEW COMPARISON:  CT from 02/23/2024 FINDINGS: Contrast from previous CT examination is now noted throughout the colon. No obstructive changes are seen. Mild retained fecal material is noted although no findings of constipation or obstruction are noted. Postsurgical changes in the lower lumbar spine and right upper quadrant noted. No free air is seen. IMPRESSION: Mild retained fecal material without significant colonic constipation. Electronically Signed   By: Oneil Devonshire M.D.   On: 02/26/2024 19:38    Recent Labs    02/26/24 1219 02/27/24 0436  WBC 12.7* 11.3*  HGB 9.7* 8.8*  HCT 29.1* 26.5*  PLT 548* 477*   Recent Labs    02/25/24 0512 02/27/24 0436  NA 137 137  K 4.5 4.5  CL 103 103  CO2 24 25  GLUCOSE 140* 118*  BUN 14 12  CREATININE 0.78 0.74  CALCIUM  9.0 8.8*    Intake/Output Summary (Last 24 hours) at 02/27/2024 0904 Last data filed at 02/27/2024 0817 Gross per 24 hour  Intake 236 ml  Output --  Net 236 ml        Physical Exam: Vital  Signs Blood pressure (!) 146/70, pulse 92, temperature 97.7 F (36.5 C), resp. rate 18, height 5' 2 (1.575 m), weight 93 kg, SpO2 97%.  General: No apparent distress, sitting up in bedside chair, appropriate. HEENT: Head is normocephalic, atraumatic, sclera anicteric, oral mucosa pink and moist Neck: Supple without JVD or lymphadenopathy Heart: Reg rate and rhythm. No murmurs rubs or gallops Chest: CTA bilaterally without wheezes, rales, or rhonchi; no distress Abdomen: Soft, non-tender, mildly-distended, bowel sounds positive. Extremities: No clubbing, cyanosis, or edema.  Psych: Flat affect, but appropriate. Skin: Surgical incision with nonadherent dressing, mild amount of serous drainage of most inferior portion.  Covered with clean ABD pad.  Stable in appearance from prior images.  Neuro:  Awake, alert, oriented x 3. No apparent cognitive deficits. 5 out of 5 bilateral upper and lower extremities.  Sensation intact in bilateral legs to light touch     Assessment/Plan: 1. Functional deficits which require 3+ hours per day of interdisciplinary therapy in a comprehensive inpatient rehab setting. Physiatrist is providing close team supervision and 24 hour management of active medical problems  listed below. Physiatrist and rehab team continue to assess barriers to discharge/monitor patient progress toward functional and medical goals  Care Tool:  Bathing              Bathing assist Assist Level: Contact Guard/Touching assist     Upper Body Dressing/Undressing Upper body dressing        Upper body assist Assist Level: Supervision/Verbal cueing    Lower Body Dressing/Undressing Lower body dressing            Lower body assist Assist for lower body dressing: Contact Guard/Touching assist     Toileting Toileting    Toileting assist Assist for toileting: Contact Guard/Touching assist     Transfers Chair/bed transfer  Transfers assist     Chair/bed  transfer assist level: Minimal Assistance - Patient > 75%     Locomotion Ambulation   Ambulation assist      Assist level: Minimal Assistance - Patient > 75% Assistive device: Walker-rolling     Walk 10 feet activity   Assist     Assist level: Minimal Assistance - Patient > 75% Assistive device: Walker-rolling   Walk 50 feet activity   Assist    Assist level: Minimal Assistance - Patient > 75% Assistive device: Walker-rolling    Walk 150 feet activity   Assist    Assist level: Minimal Assistance - Patient > 75% Assistive device: Walker-rolling    Walk 10 feet on uneven surface  activity   Assist Walk 10 feet on uneven surfaces activity did not occur: Safety/medical concerns         Wheelchair     Assist Is the patient using a wheelchair?: Yes Type of Wheelchair: Manual    Wheelchair assist level: Total Assistance - Patient < 25%      Wheelchair 50 feet with 2 turns activity    Assist        Assist Level: Total Assistance - Patient < 25%   Wheelchair 150 feet activity     Assist      Assist Level: Total Assistance - Patient < 25%   Blood pressure (!) 146/70, pulse 92, temperature 97.7 F (36.5 C), resp. rate 18, height 5' 2 (1.575 m), weight 93 kg, SpO2 97%.  Medical Problem List and Plan: 1. Functional deficits secondary to postoperative CSF leak             -patient may shower             -ELOS/Goals: 9-12 days modI             -Continue CIR  - 9/12 patient was seen by surgeon yesterday due to some redness at her incision, not felt to be true infection.  - In the afternoon patient was noted to have some increased serosanguineous drainage from inferior portion of incision.  Surgeon was notified to review, continue to monitor  9-13: Drainage appears stable, mild.  Continue current management.   2.  Antithrombotics: -DVT/anticoagulation:  Pharmaceutical: Lovenox              -antiplatelet therapy: N/A   3. Pain  Management: Fioricet  and hydrocodone  prn for pain. Kpad ordered             --gabapentin  added at nights for hip pain             --Voltaren  gel added to right hip for local measures.   - 9/11 currently has tramadol  and hydrocodone  as needed for pain, metoprolol  started in may also benefit  for headaches  -9/11 headaches improved continue current regimen 4. Mood/Behavior/Sleep: LCSW to follow for evaluation and support.              --Monitor sleep hygiene. Has had issues with insomnia. Will continue Xanax  prn for anxiety             -antipsychotic agents: N/A 5. Neuropsych/cognition: This patient may be intermittently capable of making decisions on her own behalf.   6. Skin/Wound Care:  Routine pressure relief measures.    7. Fluids/Electrolytes/Nutrition: Monitor I/O. Encourage fluid intake.   8. 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 Vitamin C supplement started   9. T2DM: Hgb A1C-7.0. d/c CBGs checks and ISS since well controlled  CBG (last 3)  Recent Labs    02/24/24 1211  GLUCAP 118*      10. Leucocytosis improving from rise to 17.7-->11.4.  - WBC a little higher at 12.7 continue to monitor  9-13: WBC stable, 11-12.     11. 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    12. Anemia of chronic illness: Recheck CBC in am. Hgb trending down from 10.7-->9.8.             --monitor for signs of bleeding.   - Hemoglobin overall stable continue to follow  9-13: Hemoglobin down slightly to 8.8.  Add on iron studies, FOBT.   13.  Abnormal LFTs: Recheck in am.   -9/11 improved today to AST 57, ALT 60   14. Rectosigmoid colitis?: Monitor intake. On Senna S. GIP negative.   - 9/12 will try to avoid stimulant laxatives  9-13: No results with lactulose ,    15. B 12 deficiency: Right hand  tremor noted--being supplemented.    16. Tachycardia: magnesium  supplement started  - 9/11 mild intermittent tachycardia, may be pain related.  See #17  -9/12 intermittent mild tachycardia, continue current regimen and monitor for now   17. HTN: magnesium  supplement started  -9/11 metoprolol  12.5 mg twice daily  - 9/12 BP fair control overall, continue current regimen and monitor  - Ongoing fair control, monitor    02/27/2024    4:53 AM 02/26/2024    9:29 PM 02/26/2024    6:37 PM  Vitals with BMI  Systolic 146 144 884  Diastolic 70 78 60  Pulse 92 104    18.  Constipation.  - 9/11 MiraLAX  34 g daily  -9/12 continue MiraLAX , will add lactulose .  Avoid stimulant laxatives when possible.  Will check abdomen x-ray to assess for stool burden  9-13: KUB without significant stool burden.  Ordered lactulose  for daily, docusate 200 mg BID.  Positive bowel movement this a.m.  After lactulose .  LOS: 3 days A FACE TO FACE EVALUATION WAS PERFORMED  Joesph JAYSON Likes 02/27/2024, 9:04 AM

## 2024-02-27 NOTE — Plan of Care (Signed)
  Problem: Consults Goal: RH GENERAL PATIENT EDUCATION Description: See Patient Education module for education specifics. Outcome: Progressing   Problem: RH BOWEL ELIMINATION Goal: RH STG MANAGE BOWEL WITH ASSISTANCE Description: STG Manage Bowel with Assistance. Outcome: Progressing   Problem: RH BLADDER ELIMINATION Goal: RH STG MANAGE BLADDER WITH ASSISTANCE Description: STG Manage Bladder With  mod I Assistance Outcome: Progressing   Problem: RH SKIN INTEGRITY Goal: RH STG SKIN FREE OF INFECTION/BREAKDOWN Description: Manage skin free of infection with mod I assistance Outcome: Progressing   Problem: RH SAFETY Goal: RH STG ADHERE TO SAFETY PRECAUTIONS W/ASSISTANCE/DEVICE Description: STG Adhere to Safety Precautions With  mod I Assistance/Device. Outcome: Progressing   Problem: RH PAIN MANAGEMENT Goal: RH STG PAIN MANAGED AT OR BELOW PT'S PAIN GOAL Description: < 4 w/ prns  Outcome: Progressing   Problem: Education: Goal: Ability to describe self-care measures that may prevent or decrease complications (Diabetes Survival Skills Education) will improve Outcome: Progressing Goal: Individualized Educational Video(s) Outcome: Progressing   Problem: Coping: Goal: Ability to adjust to condition or change in health will improve Outcome: Progressing   Problem: Fluid Volume: Goal: Ability to maintain a balanced intake and output will improve Outcome: Progressing   Problem: Health Behavior/Discharge Planning: Goal: Ability to identify and utilize available resources and services will improve Outcome: Progressing Goal: Ability to manage health-related needs will improve Outcome: Progressing   Problem: Metabolic: Goal: Ability to maintain appropriate glucose levels will improve Outcome: Progressing   Problem: Nutritional: Goal: Maintenance of adequate nutrition will improve Outcome: Progressing Goal: Progress toward achieving an optimal weight will improve Outcome:  Progressing   Problem: Skin Integrity: Goal: Risk for impaired skin integrity will decrease Outcome: Progressing   Problem: Tissue Perfusion: Goal: Adequacy of tissue perfusion will improve Outcome: Progressing

## 2024-02-27 NOTE — Plan of Care (Signed)
  Problem: Consults Goal: RH GENERAL PATIENT EDUCATION Description: See Patient Education module for education specifics. Outcome: Progressing   Problem: Coping: Goal: Ability to adjust to condition or change in health will improve Outcome: Not Progressing   Problem: Skin Integrity: Goal: Risk for impaired skin integrity will decrease Outcome: Not Progressing

## 2024-02-28 DIAGNOSIS — G9782 Other postprocedural complications and disorders of nervous system: Secondary | ICD-10-CM | POA: Diagnosis not present

## 2024-02-28 DIAGNOSIS — G9600 Cerebrospinal fluid leak, unspecified: Secondary | ICD-10-CM | POA: Diagnosis not present

## 2024-02-28 LAB — URINALYSIS, W/ REFLEX TO CULTURE (INFECTION SUSPECTED)
Bacteria, UA: NONE SEEN
Bilirubin Urine: NEGATIVE
Glucose, UA: NEGATIVE mg/dL
Hgb urine dipstick: NEGATIVE
Ketones, ur: NEGATIVE mg/dL
Leukocytes,Ua: NEGATIVE
Nitrite: NEGATIVE
Protein, ur: NEGATIVE mg/dL
Specific Gravity, Urine: 1.012 (ref 1.005–1.030)
pH: 5 (ref 5.0–8.0)

## 2024-02-28 MED ORDER — TOPIRAMATE 25 MG PO TABS
25.0000 mg | ORAL_TABLET | Freq: Every day | ORAL | Status: DC
  Administered 2024-02-28 – 2024-03-07 (×7): 25 mg via ORAL
  Filled 2024-02-28 (×10): qty 1

## 2024-02-28 NOTE — Progress Notes (Signed)
 Physical Therapy Session Note  Patient Details  Name: GINIA RUDELL MRN: [id number] Date of Birth: 1951-06-01  Today's Date: 02/28/2024 PT Individual Time: 0930-1040  PT Individual Time Calculation (min): 70 min  Short Term Goals: Week 1:  PT Short Term Goal 1 (Week 1): =LTG  Skilled Therapeutic Interventions/Progress Updates:  Chart reviewed and pt agreeable to therapy. Pt received seated in WC with 0/10 c/o pain. Session focused on functional transfers, balance, NMR, and ambulation to promote safe home mobility and access. Pt initiated session with amb of 156ft + 164ft using CGA + RW fading to close S + RW. Pt then completed blocked practice of 8 x 3-inch steps using CGA + B rails fading to S + unilateral rail. Pt then completed combination of 73ft amb + navigation of 8 steps + 75 ft amb with no rest break using close S + RW + unilateral rail for stairs. Pt them completed toe taps using S + RW for BLE. Pt noted to need increased time, but have good motor control. Pt also completed 5 mins on NuStep and level 1 with noted decrease in pain. At end of session, pt was left seated in recliner with alarm engaged, nurse call bell and all needs in reach.     Therapy Documentation Precautions:  Precautions Precautions: Back, Fall Precaution Booklet Issued: No Recall of Precautions/Restrictions: Intact Precaution/Restrictions Comments: able to verbalize 3/3 precautions Other Brace: no brace needed per orders Restrictions Weight Bearing Restrictions Per Provider Order: No    Therapy/Group: Individual Therapy   Warrick KANDICE Raspberry 02/28/2024, 12:06 PM

## 2024-02-28 NOTE — Progress Notes (Signed)
 Occupational Therapy Session Note  Patient Details  Name: Diane Watkins MRN: [id number] Date of Birth: 1951/02/10  Today's Date: 02/28/2024 OT Individual Time: 9269-9169 and 1300-1356 OT Individual Time Calculation (min): 60 min and 56 min   Short Term Goals: Week 1:  OT Short Term Goal 1 (Week 1): STG=LTG d/t ELOS   Skilled Therapeutic Interventions/Progress Updates:    Session 1: Pt bed level at time of OT session, c/o mild back pain and nursing present during session and aware, did not rate. Pt premedicated prior to session. Pt wanting to take a shower - note H&P states pt cleared to shower. OT inspected lumbar dressing and spoke with nursing - who shared that MD stated no shower at this time 2/2 fluid leak and more concern for infection. Put in OT sticky note to hold off on showering for now. Supine > sit CGA and cues for  techniques, walked with RW to sink level CGA and stood for approxx 15 mins for UB/LB bathing, seated for portion as needed for precautions, minimal fatigue. Using shower cap for hair washing 2/2 time, and performing grooming/blow drying hair all with supervision. Dressing with gown only per request with Supervision, donning underwear using figure four with CGA. Set up in wheelchair call bell in reach alarm on all needs met.   Session 2: Pt up in recliner at time of session, just finished with afternoon med pass. Pt very sleepy and extended time to wake up to participate in session. No pain reported at rest, occasional discomfort with positional changes but cues to avoid these positions. Short distance mobility in room with RW to sink, set up in wheelchair at sink level for hair washing. OT assisting with hair washing, pt performing grooming/hair drying tasks seated. Pt performing short distance mobility in room with RW Supervision, OT maneuvering IV pole throughout session. Set up in recliner alarm on call bell in reach all needs met.   Therapy  Documentation Precautions:  Precautions Precautions: Back, Fall Precaution Booklet Issued: No Recall of Precautions/Restrictions: Intact Precaution/Restrictions Comments: able to verbalize 3/3 precautions Other Brace: no brace needed per orders Restrictions Weight Bearing Restrictions Per Provider Order: No    Therapy/Group: Individual Therapy  Chiquita JAYSON Hopping 02/28/2024, 7:17 AM

## 2024-02-28 NOTE — Progress Notes (Signed)
 From report and from patient endorsed difficult night with sleep. On and off episodes of needing to void through the night. Difficulty being comfortable in bed and finding a position of comfort. Observed yesterday evening in bed spasm in left leg and reported intermittent spasms through the night. Also, patient expressed concern over left leg and non-pitting edema. Also, endorsed intermittent episodes of tremors with upper extremities mainly her left hand and difficulty holding on grasping objects. Endorsed pain to the occipital area of her head last night explained new type of pain for her. Also, endorsed a headache which she explained different from previous headaches explained more of a pressure like sensation to the front area of the top of her head not intermittent lighting like pain across the top of her head. Explained headache was worsen by lying in a more supine position. Per report did have PRN medication last night.

## 2024-02-28 NOTE — Plan of Care (Signed)
  Problem: Consults Goal: RH GENERAL PATIENT EDUCATION Description: See Patient Education module for education specifics. Outcome: Progressing   Problem: RH BOWEL ELIMINATION Goal: RH STG MANAGE BOWEL WITH ASSISTANCE Description: STG Manage Bowel with Assistance. Outcome: Progressing   Problem: RH BLADDER ELIMINATION Goal: RH STG MANAGE BLADDER WITH ASSISTANCE Description: STG Manage Bladder With  mod I Assistance Outcome: Progressing   Problem: RH SKIN INTEGRITY Goal: RH STG SKIN FREE OF INFECTION/BREAKDOWN Description: Manage skin free of infection with mod I assistance Outcome: Progressing   Problem: RH SAFETY Goal: RH STG ADHERE TO SAFETY PRECAUTIONS W/ASSISTANCE/DEVICE Description: STG Adhere to Safety Precautions With  mod I Assistance/Device. Outcome: Progressing   Problem: RH PAIN MANAGEMENT Goal: RH STG PAIN MANAGED AT OR BELOW PT'S PAIN GOAL Description: < 4 w/ prns  Outcome: Progressing   Problem: Education: Goal: Ability to describe self-care measures that may prevent or decrease complications (Diabetes Survival Skills Education) will improve Outcome: Progressing Goal: Individualized Educational Video(s) Outcome: Progressing   Problem: Coping: Goal: Ability to adjust to condition or change in health will improve Outcome: Progressing   Problem: Fluid Volume: Goal: Ability to maintain a balanced intake and output will improve Outcome: Progressing   Problem: Health Behavior/Discharge Planning: Goal: Ability to identify and utilize available resources and services will improve Outcome: Progressing Goal: Ability to manage health-related needs will improve Outcome: Progressing   Problem: Metabolic: Goal: Ability to maintain appropriate glucose levels will improve Outcome: Progressing   Problem: Nutritional: Goal: Maintenance of adequate nutrition will improve Outcome: Progressing Goal: Progress toward achieving an optimal weight will improve Outcome:  Progressing   Problem: Skin Integrity: Goal: Risk for impaired skin integrity will decrease Outcome: Progressing   Problem: Tissue Perfusion: Goal: Adequacy of tissue perfusion will improve Outcome: Progressing

## 2024-02-28 NOTE — Plan of Care (Signed)
  Problem: Consults Goal: RH GENERAL PATIENT EDUCATION Description: See Patient Education module for education specifics. Outcome: Progressing   Problem: RH BLADDER ELIMINATION Goal: RH STG MANAGE BLADDER WITH ASSISTANCE Description: STG Manage Bladder With  mod I Assistance Outcome: Progressing   Problem: RH PAIN MANAGEMENT Goal: RH STG PAIN MANAGED AT OR BELOW PT'S PAIN GOAL Description: < 4 w/ prns  Outcome: Progressing

## 2024-02-28 NOTE — Progress Notes (Signed)
 PROGRESS NOTE   Subjective/Complaints:  Reported complaints of headache when laying flat overnight, as well as some spasms in her left hand and left leg.  She states these resolved with morphine ; has not had any headaches since getting up today. Also endorsing frequent nocturia; no dysuria but does have some mild lower abdominal pain.  Tachycardic into the 110s overnight, remains with stable blood pressure.  Subclinical fever 99.5 yesterday.    02/28/2024    4:14 AM 02/28/2024    2:00 AM 02/27/2024    8:32 PM  Vitals with BMI  Systolic 126 145 859  Diastolic 82 87 67  Pulse 89 91 104    Last BM recorded 9-7; states she had 1 this a.m.  Denies chest pain, shortness of breath, abdominal pain, new vision changes, new motor or sensory changes + Constipation --improved + headache mostly at night--ongoing + Nausea and vomiting -improved + Nocturia + Spasms  Objective:   DG Abd 2 Views Result Date: 02/26/2024 CLINICAL DATA:  History of constipation EXAM: ABDOMEN - 2 VIEW COMPARISON:  CT from 02/23/2024 FINDINGS: Contrast from previous CT examination is now noted throughout the colon. No obstructive changes are seen. Mild retained fecal material is noted although no findings of constipation or obstruction are noted. Postsurgical changes in the lower lumbar spine and right upper quadrant noted. No free air is seen. IMPRESSION: Mild retained fecal material without significant colonic constipation. Electronically Signed   By: Oneil Devonshire M.D.   On: 02/26/2024 19:38    Recent Labs    02/26/24 1219 02/27/24 0436  WBC 12.7* 11.3*  HGB 9.7* 8.8*  HCT 29.1* 26.5*  PLT 548* 477*   Recent Labs    02/27/24 0436  NA 137  K 4.5  CL 103  CO2 25  GLUCOSE 118*  BUN 12  CREATININE 0.74  CALCIUM  8.8*    Intake/Output Summary (Last 24 hours) at 02/28/2024 0843 Last data filed at 02/27/2024 1610 Gross per 24 hour  Intake 900 ml   Output --  Net 900 ml        Physical Exam: Vital Signs Blood pressure 126/82, pulse 89, temperature 98.3 F (36.8 C), temperature source Oral, resp. rate 18, height 5' 2 (1.575 m), weight 93 kg, SpO2 98%.  General: No apparent distress, sitting up in bedside chair.  Obese. HEENT: Head is normocephalic, atraumatic, sclera anicteric, oral mucosa pink and moist Neck: Supple without JVD or lymphadenopathy Heart: Reg rate and rhythm. No murmurs rubs or gallops Chest: CTA bilaterally without wheezes, rales, or rhonchi; no distress Abdomen: Soft, non-tender, mildly-distended, bowel sounds positive. Extremities: No clubbing, cyanosis; 1+ bilateral nonpitting edema Psych: Flat affect, but appropriate. Skin: Surgical incision with nonadherent dressing, mild amount of serous drainage of most inferior portion--stable, covered in clean ABD pad from this morning  Neuro:  Awake, alert, oriented x 3. No apparent cognitive deficits. 5 out of 5 bilateral upper and lower extremities.  Sensation intact in bilateral legs to light touch    Assessment/Plan: 1. Functional deficits which require 3+ hours per day of interdisciplinary therapy in a comprehensive inpatient rehab setting. Physiatrist is providing close team supervision and 24 hour management of  active medical problems listed below. Physiatrist and rehab team continue to assess barriers to discharge/monitor patient progress toward functional and medical goals  Care Tool:  Bathing              Bathing assist Assist Level: Contact Guard/Touching assist     Upper Body Dressing/Undressing Upper body dressing        Upper body assist Assist Level: Supervision/Verbal cueing    Lower Body Dressing/Undressing Lower body dressing            Lower body assist Assist for lower body dressing: Contact Guard/Touching assist     Toileting Toileting    Toileting assist Assist for toileting: Contact Guard/Touching assist      Transfers Chair/bed transfer  Transfers assist     Chair/bed transfer assist level: Minimal Assistance - Patient > 75%     Locomotion Ambulation   Ambulation assist      Assist level: Minimal Assistance - Patient > 75% Assistive device: Walker-rolling     Walk 10 feet activity   Assist     Assist level: Minimal Assistance - Patient > 75% Assistive device: Walker-rolling   Walk 50 feet activity   Assist    Assist level: Minimal Assistance - Patient > 75% Assistive device: Walker-rolling    Walk 150 feet activity   Assist    Assist level: Minimal Assistance - Patient > 75% Assistive device: Walker-rolling    Walk 10 feet on uneven surface  activity   Assist Walk 10 feet on uneven surfaces activity did not occur: Safety/medical concerns         Wheelchair     Assist Is the patient using a wheelchair?: Yes Type of Wheelchair: Manual    Wheelchair assist level: Total Assistance - Patient < 25%      Wheelchair 50 feet with 2 turns activity    Assist        Assist Level: Total Assistance - Patient < 25%   Wheelchair 150 feet activity     Assist      Assist Level: Total Assistance - Patient < 25%   Blood pressure 126/82, pulse 89, temperature 98.3 F (36.8 C), temperature source Oral, resp. rate 18, height 5' 2 (1.575 m), weight 93 kg, SpO2 98%.  Medical Problem List and Plan: 1. Functional deficits secondary to postoperative CSF leak             -patient may shower             -ELOS/Goals: 9-12 days modI             -Continue CIR  - 9/12 patient was seen by surgeon yesterday due to some redness at her incision, not felt to be true infection.  - In the afternoon patient was noted to have some increased serosanguineous drainage from inferior portion of incision.  Surgeon was notified to review, continue to monitor  9-13: Drainage appears stable, mild.  Continue current management.--Stable 9-14   2.   Antithrombotics: -DVT/anticoagulation:  Pharmaceutical: Lovenox              -antiplatelet therapy: N/A   3. Pain Management: Fioricet  and hydrocodone  prn for pain. Kpad ordered             --gabapentin  added at nights for hip pain             --Voltaren  gel added to right hip for local measures.   - 9/11 currently has tramadol  and hydrocodone  as needed for pain,  metoprolol  started in may also benefit for headaches  -9/11 headaches improved continue current regimen   - 9/13: Headache overnight increased, no PRNs until 6 AM this morning, occurs mostly at nighttime and with pressure on her head.  Start Topamax  25 mg nightly given she was not asking for PRNs despite headache.  4. Mood/Behavior/Sleep: LCSW to follow for evaluation and support.              --Monitor sleep hygiene. Has had issues with insomnia. Will continue Xanax  prn for anxiety             -antipsychotic agents: N/A  5. Neuropsych/cognition: This patient may be intermittently capable of making decisions on her own behalf.   6. Skin/Wound Care:  Routine pressure relief measures.   - 9-14: Lower part of surgical site with decreasing amount of drainage through the weekend, serous, stable   7. Fluids/Electrolytes/Nutrition: Monitor I/O. Encourage fluid intake.   8. 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 Vitamin C supplement started  - 9-14: Some subclinical fevers yesterday, on chart review has been getting these intermittently.  Having some urinary urgency and lower abdominal pain, so we will get urinalysis today, however should be decently covered with current antibiotics.   9. T2DM: Hgb A1C-7.0. d/c CBGs checks and ISS since well controlled  CBG (last 3)  No results for input(s): GLUCAP in the last 72 hours.     10. Leucocytosis improving from rise to 17.7-->11.4.  - WBC a little higher at 12.7 continue to monitor  9-13: WBC stable, 11-12.     9-14: Some subclinical fevers, 99.4, UA as above and CBC in a.m.   11. 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    12. Anemia of chronic illness: Recheck CBC in am. Hgb trending down from 10.7-->9.8.             --monitor for signs of bleeding.   - Hemoglobin overall stable continue to follow  9-13: Hemoglobin down slightly to 8.8.   - iron WNL, TIBC low, consistent with anemia of chronic disease.   - FOBT negative   13.  Abnormal LFTs: Recheck in am.   -9/11 improved today to AST 57, ALT 60   14. Rectosigmoid colitis?: Monitor intake. On Senna S. GIP negative.   - 9/12 will try to avoid stimulant laxatives  9-13: See #18    15. 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    16. Tachycardia: magnesium  supplement started  - 9/11 mild intermittent tachycardia, may be pain related.  See #17  -9/12 intermittent mild tachycardia, continue current regimen and monitor for now   17. HTN: magnesium  supplement started  -9/11 metoprolol  12.5 mg twice daily  - 9/12 BP fair control overall, continue current regimen and monitor  - Ongoing fair control, monitor    02/28/2024    4:14 AM 02/28/2024    2:00 AM 02/27/2024    8:32 PM  Vitals with BMI  Systolic 126 145 859  Diastolic 82 87 67  Pulse 89 91 104   18.  Constipation.  - 9/11 MiraLAX  34 g daily  -9/12 continue MiraLAX , will add lactulose .  Avoid stimulant laxatives when possible.  Will check abdomen x-ray to assess for stool burden  - 9-13: KUB without  significant stool burden.  Ordered lactulose  for daily, docusate 200 mg BID.  Positive bowel movement this a.m.  After lactulose .  LOS: 4 days A FACE TO FACE EVALUATION WAS PERFORMED  Joesph JAYSON Likes 02/28/2024, 8:43 AM

## 2024-02-29 DIAGNOSIS — I10 Essential (primary) hypertension: Secondary | ICD-10-CM | POA: Diagnosis not present

## 2024-02-29 DIAGNOSIS — G9782 Other postprocedural complications and disorders of nervous system: Secondary | ICD-10-CM | POA: Diagnosis not present

## 2024-02-29 DIAGNOSIS — R000 Tachycardia, unspecified: Secondary | ICD-10-CM | POA: Diagnosis not present

## 2024-02-29 DIAGNOSIS — K5900 Constipation, unspecified: Secondary | ICD-10-CM | POA: Diagnosis not present

## 2024-02-29 LAB — CBC
HCT: 27.1 % — ABNORMAL LOW (ref 36.0–46.0)
Hemoglobin: 8.9 g/dL — ABNORMAL LOW (ref 12.0–15.0)
MCH: 31.4 pg (ref 26.0–34.0)
MCHC: 32.8 g/dL (ref 30.0–36.0)
MCV: 95.8 fL (ref 80.0–100.0)
Platelets: 491 K/uL — ABNORMAL HIGH (ref 150–400)
RBC: 2.83 MIL/uL — ABNORMAL LOW (ref 3.87–5.11)
RDW: 14 % (ref 11.5–15.5)
WBC: 12.7 K/uL — ABNORMAL HIGH (ref 4.0–10.5)
nRBC: 0 % (ref 0.0–0.2)

## 2024-02-29 LAB — BASIC METABOLIC PANEL WITH GFR
Anion gap: 8 (ref 5–15)
BUN: 14 mg/dL (ref 8–23)
CO2: 26 mmol/L (ref 22–32)
Calcium: 8.9 mg/dL (ref 8.9–10.3)
Chloride: 102 mmol/L (ref 98–111)
Creatinine, Ser: 0.91 mg/dL (ref 0.44–1.00)
GFR, Estimated: >60 mL/min (ref >60)
Glucose, Bld: 208 mg/dL — ABNORMAL HIGH (ref 70–99)
Potassium: 4.1 mmol/L (ref 3.5–5.1)
Sodium: 136 mmol/L (ref 135–145)

## 2024-02-29 NOTE — Progress Notes (Signed)
 Occupational Therapy Session Note  Patient Details  Name: Diane Watkins MRN: [id number] Date of Birth: [birth year]  Today's Date: 02/29/2024 OT Individual Time: 8863-8843 OT Individual Time Calculation (min): 20 min    Short Term Goals: Week 1:  OT Short Term Goal 1 (Week 1): STG=LTG d/t ELOS  Skilled Therapeutic Interventions/Progress Updates:  Skilled OT session completed to address toileting hygiene. Pt received seated in recliner, agreeable to participate in therapy. Pt reports no pain.  Pt requested to void, recliner>3n1 seat ambulating with RW CGA. Pt attempts to perform toileting hygiene standing, VC provided to discontinue d/t back precautions. Pt attempts to perform hygiene while squatting, CGA for balance requires Min A to complete hygiene, pt able to perform clothing management seated at commode to retrieve underwear and follow precautions. Pt ambulates with RW to recliner CGA. OT demos AE for toileting hygiene, pt reports daughter in law has purchased a toilet wand but finds difficulty keeping device clean. Pt declines use of LH sponge for bathing. F/u with toilet tongs for alternative. OT prompts pt to verbalize all back precautions, pt verbalizes with independence. At conclusion of session. Pt seated in recliner with chair alarm on and all needs within reach.  Therapy Documentation Precautions:  Precautions Precautions: Back, Fall Precaution Booklet Issued: No Recall of Precautions/Restrictions: Intact Precaution/Restrictions Comments: able to verbalize 3/3 precautions Other Brace: no brace needed per orders Restrictions Weight Bearing Restrictions Per Provider Order: No   Therapy/Group: Individual Therapy  Dezi Schaner Woods-Chance, MS, OTR/L 02/29/2024, 4:24 PM

## 2024-02-29 NOTE — Progress Notes (Signed)
 Physical Therapy Session Note  Patient Details  Name: Diane Watkins MRN: [id number] Date of Birth: Dec 14, 1950  Today's Date: 02/29/2024 PT Individual Time: 1445-1555 PT Individual Time Calculation (min): 70 min   Short Term Goals: Week 1:  PT Short Term Goal 1 (Week 1): =LTG  Skilled Therapeutic Interventions/Progress Updates:  Chart reviewed and pt agreeable to therapy. Pt received seated in recliner with 0/10 c/o pain. Also of note, daughter in law present in room who demonstrated safe supervision of pt mobility in room and was cleared for supervising amb trasnfers in room. Session then focused on functional transfers, ambulation, and endurance to promote safe home mobility and access and activity tolerance. Pt initiated session with interval training on NuStep for 12 mins at workloads 1-6. Pt noted to report tightness in back that subsided t/o NuStep activity. Pt displayed good pace and BLE for activity, but reported strong fatigue after exercise and pain in R hip. BP checked and found to be 157/68mmHg. Pt rested for 3 mins and BP again assessed and found to be 139/39mmHg with pt reporting that dizziness and pain had subsided. Pt then taken to nurse station for daughter in law and pt to inform nursing that pt would spend 30 mins outdoors after PT session in care of daughter in law. Pt then taken outside for continued amb practice. Pt completed blocked practice of 49ft amb using close S + RW over uneven surfaces. Pt displayed good use of DME t/o activity. Pt then reported high fatigue at end of session, but no continued dizziness or pain in back/hip.At end of session, pt was left seated in locked WC in care of daughter in law outside. Nursing staff reminded of pt location.       Therapy Documentation Precautions:  Precautions Precautions: Back, Fall Precaution Booklet Issued: No Recall of Precautions/Restrictions: Intact Precaution/Restrictions Comments: able to verbalize 3/3  precautions Other Brace: no brace needed per orders Restrictions Weight Bearing Restrictions Per Provider Order: No General:      Therapy/Group: Individual Therapy  Warrick KANDICE Raspberry 02/29/2024, 4:09 PM

## 2024-02-29 NOTE — Progress Notes (Signed)
 Occupational Therapy Session Note  Patient Details  Name: Diane Watkins MRN: [id number] Date of Birth: [birth year]  Today's Date: 02/29/2024 OT Individual Time: 9168-9071 OT Individual Time Calculation (min): 57 min    Short Term Goals: Week 1:  OT Short Term Goal 1 (Week 1): STG=LTG d/t ELOS   Skilled Therapeutic Interventions/Progress Updates:    Pt sitting up at EOB at time of session, extended time for med pass, MD visit, and IV team to see the pt this morning as well. No pain at rest, stating only mild discomfort with certain positional changes but did not impact session. Pt ambulating bed > bathroom > sink level all with SBA and RW. Toilet transfer, clothing, hygiene for urine only with Supervision. Standing at sink level for approx 15 mins with no seated breaks for bathing and grooming tasks - declined LB as she had incontinent episode this AM and states nursing cleaned her up. Discussion with pt for DC planning as pt is concerned about her CLOF - reviewed techniques for hygiene within back precautions, use of bidet, toilet aides, etc. Pt up in recliner alarm on call bell in reach all needs met.   Therapy Documentation Precautions:  Precautions Precautions: Back, Fall Precaution Booklet Issued: No Recall of Precautions/Restrictions: Intact Precaution/Restrictions Comments: able to verbalize 3/3 precautions Other Brace: no brace needed per orders Restrictions Weight Bearing Restrictions Per Provider Order: No    Therapy/Group: Individual Therapy  Diane Watkins 02/29/2024, 7:20 AM

## 2024-02-29 NOTE — Progress Notes (Signed)
 PROGRESS NOTE   Subjective/Complaints:  HA and nausea has improved. Reports some occasional jerking of her hands when using phone.   LBM today  Denies chest pain, shortness of breath, abdominal pain, new vision changes, new motor or sensory changes + Constipation --improved + headache mostly at night--improved + Nausea and vomiting -improved + Nocturia + Spasms  Objective:   No results found.   Recent Labs    02/27/24 0436 02/29/24 0403  WBC 11.3* 12.7*  HGB 8.8* 8.9*  HCT 26.5* 27.1*  PLT 477* 491*   Recent Labs    02/27/24 0436 02/29/24 0403  NA 137 136  K 4.5 4.1  CL 103 102  CO2 25 26  GLUCOSE 118* 208*  BUN 12 14  CREATININE 0.74 0.91  CALCIUM  8.8* 8.9    Intake/Output Summary (Last 24 hours) at 02/29/2024 0927 Last data filed at 02/29/2024 0815 Gross per 24 hour  Intake 594 ml  Output --  Net 594 ml        Physical Exam: Vital Signs Blood pressure 127/61, pulse 91, temperature 98.6 F (37 C), resp. rate 18, height 5' 2 (1.575 m), weight 93 kg, SpO2 94%.  General: No apparent distress, sitting up in bed HEENT: Head is normocephalic, atraumatic, sclera anicteric, oral mucosa pink and moist Neck: Supple without JVD or lymphadenopathy Heart: Reg rate and rhythm. No murmurs rubs or gallops Chest: CTA bilaterally without wheezes, rales, or rhonchi; no distress Abdomen: Soft, non-tender, mildly-distended, bowel sounds positive. Extremities: No clubbing, cyanosis; 1+ bilateral nonpitting edema Psych: Flat affect, but appropriate. Skin: Surgical incision with nonadherent dressing,  covered in clean and dry ABD pad  Neuro:  Awake, alert, oriented x 3. No apparent cognitive deficits. 5 out of 5 bilateral upper and lower extremities.  Sensation intact in bilateral legs to light touch    Assessment/Plan: 1. Functional deficits which require 3+ hours per day of interdisciplinary therapy in a  comprehensive inpatient rehab setting. Physiatrist is providing close team supervision and 24 hour management of active medical problems listed below. Physiatrist and rehab team continue to assess barriers to discharge/monitor patient progress toward functional and medical goals  Care Tool:  Bathing    Body parts bathed by patient: Left upper leg, Right arm, Left arm, Chest, Abdomen, Front perineal area, Buttocks, Right upper leg, Face   Body parts bathed by helper: Right lower leg, Left lower leg, Buttocks     Bathing assist Assist Level: Moderate Assistance - Patient 50 - 74%     Upper Body Dressing/Undressing Upper body dressing        Upper body assist Assist Level: Supervision/Verbal cueing    Lower Body Dressing/Undressing Lower body dressing      What is the patient wearing?: Underwear/pull up     Lower body assist Assist for lower body dressing: Contact Guard/Touching assist     Toileting Toileting    Toileting assist Assist for toileting: Contact Guard/Touching assist     Transfers Chair/bed transfer  Transfers assist     Chair/bed transfer assist level: Minimal Assistance - Patient > 75%     Locomotion Ambulation   Ambulation assist      Assist  level: Minimal Assistance - Patient > 75% Assistive device: Walker-rolling     Walk 10 feet activity   Assist     Assist level: Minimal Assistance - Patient > 75% Assistive device: Walker-rolling   Walk 50 feet activity   Assist    Assist level: Minimal Assistance - Patient > 75% Assistive device: Walker-rolling    Walk 150 feet activity   Assist    Assist level: Minimal Assistance - Patient > 75% Assistive device: Walker-rolling    Walk 10 feet on uneven surface  activity   Assist Walk 10 feet on uneven surfaces activity did not occur: Safety/medical concerns         Wheelchair     Assist Is the patient using a wheelchair?: Yes Type of Wheelchair: Manual     Wheelchair assist level: Total Assistance - Patient < 25%      Wheelchair 50 feet with 2 turns activity    Assist        Assist Level: Total Assistance - Patient < 25%   Wheelchair 150 feet activity     Assist      Assist Level: Total Assistance - Patient < 25%   Blood pressure 127/61, pulse 91, temperature 98.6 F (37 C), resp. rate 18, height 5' 2 (1.575 m), weight 93 kg, SpO2 94%.  Medical Problem List and Plan: 1. Functional deficits secondary to postoperative CSF leak             -patient may shower             -ELOS/Goals: 9-12 days modI             -Continue CIR  - 9/12 patient was seen by surgeon yesterday due to some redness at her incision, not felt to be true infection.  - In the afternoon patient was noted to have some increased serosanguineous drainage from inferior portion of incision.  Surgeon was notified to review, continue to monitor  9-13: Drainage appears stable, mild.  Continue current management.--Stable 9-14  9/15 seen by ortho today- continue current regimen    2.  Antithrombotics: -DVT/anticoagulation:  Pharmaceutical: Lovenox              -antiplatelet therapy: N/A   3. Pain Management: Fioricet  and hydrocodone  prn for pain. Kpad ordered             --gabapentin  added at nights for hip pain             --Voltaren  gel added to right hip for local measures.   - 9/11 currently has tramadol  and hydrocodone  as needed for pain, metoprolol  started in may also benefit for headaches  -9/11 headaches improved continue current regimen   - 9/13: Headache overnight increased, no PRNs until 6 AM this morning, occurs mostly at nighttime and with pressure on her head.  Start Topamax  25 mg nightly given she was not asking for PRNs despite headache.  9/15 HA doing better, continue current   4. Mood/Behavior/Sleep: LCSW to follow for evaluation and support.              --Monitor sleep hygiene. Has had issues with insomnia. Will continue Xanax  prn for  anxiety             -antipsychotic agents: N/A  5. Neuropsych/cognition: This patient may be intermittently capable of making decisions on her own behalf.   6. Skin/Wound Care:  Routine pressure relief measures.   - 9-14: Lower part of surgical site with decreasing amount  of drainage through the weekend, serous, stable   7. Fluids/Electrolytes/Nutrition: Monitor I/O. Encourage fluid intake.   8. 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 Vitamin C supplement started  - 9-14: Some subclinical fevers yesterday, on chart review has been getting these intermittently.  Having some urinary urgency and lower abdominal pain, so we will get urinalysis today, however should be decently covered with current antibiotics.  UA does not indicate infection   9. T2DM: Hgb A1C-7.0. d/c CBGs checks and ISS since well controlled  CBG (last 3)  No results for input(s): GLUCAP in the last 72 hours.     10. Leucocytosis improving from rise to 17.7-->11.4.  - WBC a little higher at 12.7 continue to monitor  9-13: WBC stable, 11-12.    9-14: Some subclinical fevers, 99.4, UA as above and CBC in a.m.  9/15 WBC 12.7, continue to monitor for signs of infection, Tmax 99.7   11. 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    12. Anemia of chronic illness: Recheck CBC in am. Hgb trending down from 10.7-->9.8.             --monitor for signs of bleeding.   - Hemoglobin overall stable continue to follow  9-13: Hemoglobin down slightly to 8.8.   - iron WNL, TIBC low, consistent with anemia of chronic disease.   - FOBT negative  9/15 stable hgb at 8.9   13.  Abnormal LFTs: Recheck in am.   -9/11 improved today to AST 57, ALT 60   14. Rectosigmoid colitis?: Monitor intake. On Senna S. GIP negative.   - 9/12 will try to  avoid stimulant laxatives  9-13: See #18    15. 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    16. Tachycardia: magnesium  supplement started  - 9/11 mild intermittent tachycardia, may be pain related.  See #17  -9/12 intermittent mild tachycardia, continue current regimen and monitor for now   17. HTN: magnesium  supplement started  -9/11 metoprolol  12.5 mg twice daily  - 9/12 BP fair control overall, continue current regimen and monitor  - Ongoing fair control, monitor  -9/15 stable continue current regimen     02/29/2024    3:31 AM 02/28/2024    9:20 PM 02/28/2024    5:32 PM  Vitals with BMI  Systolic 127 138 858  Diastolic 61 74 75  Pulse 91 103 96   18.  Constipation.  - 9/11 MiraLAX  34 g daily  -9/12 continue MiraLAX , will add lactulose .  Avoid stimulant laxatives when possible.  Will check abdomen x-ray to assess for stool burden  - 9-13: KUB without significant stool burden.  Ordered lactulose  for daily, docusate 200 mg BID.  Positive bowel movement this a.m.  After lactulose .  9/15 LBM today, improved   LOS: 5 days A FACE TO FACE EVALUATION WAS PERFORMED  Murray Collier 02/29/2024, 9:27 AM

## 2024-02-29 NOTE — Progress Notes (Signed)
 Met with patient to review current situation, team conference and plan of care. Reviewed back precautions, incision care, pain meds, picc line care On IV cefepime  X 6 weeks with EOT 11/01. Reviewed dietary modifications and follow up with MD after d/c. Continue to follow along  to provide educational needs to facilitate preparation for discharge.

## 2024-02-29 NOTE — Progress Notes (Signed)
 Subjective:     The patient denies any significant headaches.  She has some occasional and swelling down.  Her pain is improving.  She reports making some progress with therapy.  Objective: Vital signs in last 24 hours: Temp:  [98.6 F (37 C)-99.7 F (37.6 C)] 98.6 F (37 C) (09/15 0331) Pulse Rate:  [91-103] 91 (09/15 0331) Resp:  [18] 18 (09/15 0331) BP: (110-141)/(61-75) 127/61 (09/15 0331) SpO2:  [94 %-95 %] 94 % (09/15 0331)  Intake/Output from previous day: 09/14 0701 - 09/15 0700 In: 354 [P.O.:354] Out: -   Labs: Recent Labs    02/27/24 0436 02/29/24 0403  WBC 11.3* 12.7*  RBC 2.81* 2.83*  HCT 26.5* 27.1*  PLT 477* 491*   Recent Labs    02/27/24 0436 02/29/24 0403  NA 137 136  K 4.5 4.1  CL 103 102  CO2 25 26  BUN 12 14  CREATININE 0.74 0.91  GLUCOSE 118* 208*  CALCIUM  8.8* 8.9   No results for input(s): LABPT, INR in the last 72 hours.  Physical Exam: Well-appearing woman in no acute distress Body mass index is 37.49 kg/m.  Incision C/D/I  Sensation     Right      Left  L3   Intact     Intact L4   Intact     Intact L5    Intact     Intact S1   Intact     Intact    Motor Exam     Right     Left Iliopsoas  5/5     5/5  Quad   5/5     5/5  Hamstring  5/5     5/5  Tibials Anterior 5/5     5/5  EHL   5/5     5/5  Gastrocs  5/5     5/5      Assessment/Plan: Status post lumbar fusion with postoperative spinal headache  Patient seems to be progressing.  She has some some slight headaches but not positional in nature.  There was some slight drainage dressing, however no drainage from the incision.  No erythema or redness.  She can continue with physical therapy.  Continue with antibiotics per medicine/ID.  Cordella Rhein, MD, MS [facility] [phone]

## 2024-02-29 NOTE — Plan of Care (Signed)
  Problem: Consults Goal: RH GENERAL PATIENT EDUCATION Description: See Patient Education module for education specifics. Outcome: Progressing   Problem: RH PAIN MANAGEMENT Goal: RH STG PAIN MANAGED AT OR BELOW PT'S PAIN GOAL Description: < 4 w/ prns  Outcome: Progressing   Problem: Skin Integrity: Goal: Risk for impaired skin integrity will decrease Outcome: Progressing

## 2024-03-01 DIAGNOSIS — G9600 Cerebrospinal fluid leak, unspecified: Secondary | ICD-10-CM | POA: Diagnosis not present

## 2024-03-01 DIAGNOSIS — G9782 Other postprocedural complications and disorders of nervous system: Secondary | ICD-10-CM | POA: Diagnosis not present

## 2024-03-01 LAB — SEDIMENTATION RATE: Sed Rate: 67 mm/h — ABNORMAL HIGH (ref 0–22)

## 2024-03-01 LAB — C-REACTIVE PROTEIN: CRP: 1.8 mg/dL — ABNORMAL HIGH (ref <1.0)

## 2024-03-01 NOTE — Progress Notes (Signed)
 Patient ID: Diane Watkins, female   DOB: Aug 12, 1950, 73 y.o.   MRN: [id number]  Met with pt and spoke with Julie-daughter in-law to give team conference update with goals of mod/I and target discharge date of 9/23. Pt has DME and have set up home health via [facility] for therapies and IV antibiotics. Will have education prior to discharge for IV antibiotics. Pt not feeling well today and MD is aware of this. Will continue to work on discharge needs.

## 2024-03-01 NOTE — Progress Notes (Signed)
 Occupational Therapy Session Note  Patient Details  Name: Diane Watkins MRN: [id number] Date of Birth: Apr 18, 1951  Today's Date: 03/01/2024 OT Individual Time: 8694-8581 OT Individual Time Calculation (min): 73 min    Short Term Goals: Week 1:  OT Short Term Goal 1 (Week 1): STG=LTG d/t ELOS  Skilled Therapeutic Interventions/Progress Updates:    Pt received resting in bed presenting to be in good spirits receptive to skilled OT session reporting 0/10 pain but feeling drunk and low energy but willing to try her best. OT offering intermittent rest breaks, repositioning, and therapeutic support to optimize participation in therapy session. Modified session to meet Pt needs. Pt complete supine>EOB with close S. Ambulated 10 ft utilizing RW to [facility] with CGA for safety and cueing for awareness of PICC  positioning. Engaged Pt in hair washing at sink with MAX A utilizing hair washing tray to maintain precautions to increase self image for MAX participation in therapy. Pt thankful for assisting with hair washing. Pt completed hair drying with personal hair dryer and brushing hair while seated at the sink for energy conservation, to address UE conditioning. Pt completed hygiene and oral care while seated at the sink for energy conservation. Pt requested to go to bathroom, ambulated 15 ft utilizing RW to bathroom and completed 3/3 toileting tasks with CGA for safety. Pt ambulated approximately 15 ft back to [facility] utilizing RW with CGA for safety. Pt completed 10 reps of bicep curls and chest press with 1# weighted dowel while standing with MIN A for balance and safety. Pt seated back in WC with MIN A d/t feeling dizzy, checked vitals BP 135/71 (91) O2 100. Pt ambulated approximately 5 ft to bed utilizing RW with CGA. EOB>supine with S. Pt was left resting in bed with call bell in reach, bed alarm on, and all needs met.    Therapy Documentation Precautions:  Precautions Precautions: Back, Fall Precaution  Booklet Issued: No Recall of Precautions/Restrictions: Intact Precaution/Restrictions Comments: able to verbalize 3/3 precautions Other Brace: no brace needed per orders Restrictions Weight Bearing Restrictions Per Provider Order: No  Therapy/Group: Individual Therapy  Blanch Stang Van Schaick 03/01/2024, 1:37 PM

## 2024-03-01 NOTE — Plan of Care (Signed)
  Problem: Consults Goal: RH GENERAL PATIENT EDUCATION Description: See Patient Education module for education specifics. Outcome: Progressing   Problem: RH BOWEL ELIMINATION Goal: RH STG MANAGE BOWEL WITH ASSISTANCE Description: STG Manage Bowel with Assistance. Outcome: Progressing   Problem: RH BLADDER ELIMINATION Goal: RH STG MANAGE BLADDER WITH ASSISTANCE Description: STG Manage Bladder With  mod I Assistance Outcome: Progressing   Problem: RH SKIN INTEGRITY Goal: RH STG SKIN FREE OF INFECTION/BREAKDOWN Description: Manage skin free of infection with mod I assistance Outcome: Progressing   Problem: RH SAFETY Goal: RH STG ADHERE TO SAFETY PRECAUTIONS W/ASSISTANCE/DEVICE Description: STG Adhere to Safety Precautions With  mod I Assistance/Device. Outcome: Progressing   Problem: RH PAIN MANAGEMENT Goal: RH STG PAIN MANAGED AT OR BELOW PT'S PAIN GOAL Description: < 4 w/ prns  Outcome: Progressing   Problem: RH KNOWLEDGE DEFICIT GENERAL Goal: RH STG INCREASE KNOWLEDGE OF SELF CARE AFTER HOSPITALIZATION Description: Manage increase knowledge deficits f self care after hospitalization with  mod I assistance from spouse using educational materials provided Outcome: Progressing   Problem: Education: Goal: Ability to describe self-care measures that may prevent or decrease complications (Diabetes Survival Skills Education) will improve Outcome: Progressing Goal: Individualized Educational Video(s) Outcome: Progressing   Problem: Coping: Goal: Ability to adjust to condition or change in health will improve Outcome: Progressing   Problem: Fluid Volume: Goal: Ability to maintain a balanced intake and output will improve Outcome: Progressing   Problem: Health Behavior/Discharge Planning: Goal: Ability to identify and utilize available resources and services will improve Outcome: Progressing Goal: Ability to manage health-related needs will improve Outcome: Progressing    Problem: Metabolic: Goal: Ability to maintain appropriate glucose levels will improve Outcome: Progressing   Problem: Nutritional: Goal: Maintenance of adequate nutrition will improve Outcome: Progressing Goal: Progress toward achieving an optimal weight will improve Outcome: Progressing   Problem: Skin Integrity: Goal: Risk for impaired skin integrity will decrease Outcome: Progressing   Problem: Tissue Perfusion: Goal: Adequacy of tissue perfusion will improve Outcome: Progressing

## 2024-03-01 NOTE — Progress Notes (Signed)
 PROGRESS NOTE   Subjective/Complaints:  Pt reports had HA overnight and was tearful per Night nurse But doesn't have HA this AM- did receive Morphine  overnight- and feels groggy this AM.   Was going to eat, but felt so groggy laid back down.  LBM yesterday.  Constipation better.     ROS: Per HPI Pt denies SOB, abd pain, CP, N/V/C/D, and vision changes  + Constipation --improved + headache mostly at night--not better anymore + Nausea and vomiting -improved + Nocturia + Spasms  Objective:   No results found.   Recent Labs    02/29/24 0403  WBC 12.7*  HGB 8.9*  HCT 27.1*  PLT 491*   Recent Labs    02/29/24 0403  NA 136  K 4.1  CL 102  CO2 26  GLUCOSE 208*  BUN 14  CREATININE 0.91  CALCIUM  8.9    Intake/Output Summary (Last 24 hours) at 03/01/2024 0959 Last data filed at 03/01/2024 0800 Gross per 24 hour  Intake 120 ml  Output --  Net 120 ml        Physical Exam: Vital Signs Blood pressure (!) 101/51, pulse 85, temperature 97.9 F (36.6 C), temperature source Oral, resp. rate 17, height 5' 2 (1.575 m), weight 93 kg, SpO2 95%.    General: awake, alert, appropriate, c/o grogginess, but looks OK otherwise;  NAD HENT: conjugate gaze; oropharynx moist CV: regular rate and rhythm; no JVD Pulmonary: CTA B/L; no W/R/R- good air movement GI: soft, NT, ND, (+)BS- more normoactive Psychiatric: appropriate but still flat affct Neurological: Ox3 but appears al ittle groggy- laying on her side with head at END of bed, not top.   Extremities: No clubbing, cyanosis; 1+ bilateral nonpitting edema Psych: Flat affect, but appropriate. Skin: Surgical incision with nonadherent dressing,  covered in clean and dry ABD pad  Neuro:  Awake, alert, oriented x 3. No apparent cognitive deficits. 5 out of 5 bilateral upper and lower extremities.  Sensation intact in bilateral legs to light  touch    Assessment/Plan: 1. Functional deficits which require 3+ hours per day of interdisciplinary therapy in a comprehensive inpatient rehab setting. Physiatrist is providing close team supervision and 24 hour management of active medical problems listed below. Physiatrist and rehab team continue to assess barriers to discharge/monitor patient progress toward functional and medical goals  Care Tool:  Bathing    Body parts bathed by patient: Left upper leg, Right arm, Left arm, Chest, Abdomen, Front perineal area, Buttocks, Right upper leg, Face   Body parts bathed by helper: Right lower leg, Left lower leg, Buttocks     Bathing assist Assist Level: Moderate Assistance - Patient 50 - 74%     Upper Body Dressing/Undressing Upper body dressing        Upper body assist Assist Level: Supervision/Verbal cueing    Lower Body Dressing/Undressing Lower body dressing      What is the patient wearing?: Underwear/pull up     Lower body assist Assist for lower body dressing: Contact Guard/Touching assist     Toileting Toileting    Toileting assist Assist for toileting: Contact Guard/Touching assist     Transfers Chair/bed transfer  Transfers assist     Chair/bed transfer assist level: Minimal Assistance - Patient > 75%     Locomotion Ambulation   Ambulation assist      Assist level: Minimal Assistance - Patient > 75% Assistive device: Walker-rolling     Walk 10 feet activity   Assist     Assist level: Minimal Assistance - Patient > 75% Assistive device: Walker-rolling   Walk 50 feet activity   Assist    Assist level: Minimal Assistance - Patient > 75% Assistive device: Walker-rolling    Walk 150 feet activity   Assist    Assist level: Minimal Assistance - Patient > 75% Assistive device: Walker-rolling    Walk 10 feet on uneven surface  activity   Assist Walk 10 feet on uneven surfaces activity did not occur: Safety/medical  concerns         Wheelchair     Assist Is the patient using a wheelchair?: Yes Type of Wheelchair: Manual    Wheelchair assist level: Total Assistance - Patient < 25%      Wheelchair 50 feet with 2 turns activity    Assist        Assist Level: Total Assistance - Patient < 25%   Wheelchair 150 feet activity     Assist      Assist Level: Total Assistance - Patient < 25%   Blood pressure (!) 101/51, pulse 85, temperature 97.9 F (36.6 C), temperature source Oral, resp. rate 17, height 5' 2 (1.575 m), weight 93 kg, SpO2 95%.  Medical Problem List and Plan: 1. 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  - In the afternoon patient was noted to have some increased serosanguineous drainage from inferior portion of incision.  Surgeon was notified to review, continue to monitor  9-13: Drainage appears stable, mild.  Continue current management.--Stable 9-14  9/15 seen by ortho today- continue current regimen    Con't CIR PT and OT and SLP  Team conference today to determine LOS 2.  Antithrombotics: -DVT/anticoagulation:  Pharmaceutical: Lovenox              -antiplatelet therapy: N/A   3. Pain Management: Fioricet  and hydrocodone  prn for pain. Kpad ordered             --gabapentin  added at nights for hip pain             --Voltaren  gel added to right hip for local measures.   - 9/11 currently has tramadol  and hydrocodone  as needed for pain, metoprolol  started in may also benefit for headaches  -9/11 headaches improved continue current regimen   - 9/13: Headache overnight increased, no PRNs until 6 AM this morning, occurs mostly at nighttime and with pressure on her head.   Start Topamax  25 mg nightly given she was not asking for PRNs despite headache.  9/15 HA doing better, continue current   9/16 - HA's again yesterday afternoon and overnight- not this AM- last pain meds just before midnight.   4. Mood/Behavior/Sleep: LCSW to follow for evaluation and support.              --  Monitor sleep hygiene. Has had issues with insomnia. Will continue Xanax  prn for anxiety             -antipsychotic agents: N/A  5. Neuropsych/cognition: This patient may be intermittently capable of making decisions on her own behalf.   6. Skin/Wound Care:  Routine pressure relief measures.   - 9-14: Lower part of surgical site with decreasing amount of drainage through the weekend, serous, stable   7. Fluids/Electrolytes/Nutrition: Monitor I/O. Encourage fluid intake.   8. 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 Vitamin C supplement started  - 9-14: Some subclinical fevers yesterday, on chart review has been getting these intermittently.  Having some urinary urgency and lower abdominal pain, so we will get urinalysis today, however should be decently covered with current antibiotics.  UA does not indicate infection   9/16- CRP 1.8 and ESR 67- first time checked- ordered qweek 9. T2DM: Hgb A1C-7.0. d/c CBGs checks and ISS since well controlled  CBG (last 3)  No results for input(s): GLUCAP in the last 72 hours.     10. Leucocytosis improving from rise to 17.7-->11.4.  - WBC a little higher at 12.7 continue to monitor  9-13: WBC stable, 11-12.    9-14: Some subclinical fevers, 99.4, UA as above and CBC in a.m.  9/15 WBC 12.7, continue to monitor for signs of infection, Tmax 99.7   9/16- Tm 99.3- will recheck Labs Thursday unless things worse clinically.  11. 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    12. Anemia of chronic illness: Recheck CBC in am. Hgb trending down from 10.7-->9.8.             --monitor for signs of bleeding.   - Hemoglobin overall stable continue to follow  9-13: Hemoglobin down slightly to 8.8.   - iron WNL, TIBC low, consistent with anemia of chronic disease.   - FOBT negative  9/15 stable hgb at 8.9   13.  Abnormal LFTs: Recheck in am.   -9/11 improved today to AST 57, ALT 60   14. Rectosigmoid colitis?: Monitor intake. On Senna S. GIP negative.   - 9/12 will try to avoid stimulant laxatives  9-13: See #18    15. 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    16. Tachycardia: magnesium  supplement started  - 9/11 mild intermittent tachycardia, may be pain related.  See #17  -9/12 intermittent mild tachycardia, continue current regimen and monitor for now   17. HTN: magnesium  supplement started  -9/11 metoprolol  12.5 mg twice daily  - 9/12 BP fair control overall, continue current regimen and monitor  - Ongoing fair control, monitor  -9/15 stable continue current regimen     03/01/2024    8:09 AM 03/01/2024    4:32 AM 02/29/2024    9:20 PM  Vitals with BMI  Systolic 101 106 870  Diastolic 51 50 68  Pulse 85 80 106   18.  Constipation.  - 9/11 MiraLAX  34 g daily  -9/12 continue MiraLAX , will add lactulose .  Avoid stimulant laxatives when possible.  Will check abdomen x-ray to assess for stool burden  - 9-13: KUB without significant stool burden.  Ordered lactulose  for daily, docusate 200 mg BID.  Positive bowel movement this a.m.  After lactulose .  9/15 LBM today, improved   9/16- LBM yesterday- feeling good   I spent a total of 38   minutes on total care today- >50% coordination of care- due to  Review of chart, and labs, vitals and B/B- also team conference to determine LOS  LOS: 6 days A FACE TO FACE EVALUATION WAS PERFORMED  Copper Kirtley 03/01/2024, 9:59 AM

## 2024-03-01 NOTE — Patient Care Conference (Incomplete)
 Inpatient RehabilitationTeam Conference and Plan of Care Update Date: 03/01/2024   Time: 1136 am    Patient Name: Diane Watkins      Medical Record Number: [id number]  Date of Birth: [birth year] Sex: Female         Room/Bed: 4M01C/4M01C-01 Payor Info: Payor: [employer] / Plan: [employer] PPO / Product Type: *No Product type* /    Admit Date/Time:  02/24/2024  2:28 PM  Primary Diagnosis:  Postoperative CSF leak  Hospital Problems: Principal Problem:   Postoperative CSF leak    Expected Discharge Date: Expected Discharge Date: 03/08/24  Team Members Present: Physician leading conference: Dr. Duwaine Barrs Social Worker Present: Rhoda Clement, LCSW Nurse Present: Eulalio Falls, RN PT Present: Other (comment) Lessie Miso PT) OT Present: Delon Sharps, OT PPS Coordinator present : Eleanor Colon, SLP     Current Status/Progress Goal Weekly Team Focus  Bowel/Bladder   Continent B/B LBM 09/15   Will remain continent with normal B/B pattern   Assit with toilet needs qshift/prn    Swallow/Nutrition/ Hydration               ADL's   (S) UB ADls, min A LB, CGA ADL transfers with the RW   mod I   ADLs, transfers, endurance, pain management    Mobility   CGA for ambulation, intermittent HA's, nausea with vomiting, min LBP, very weak; fatigues easily   mod indep with mobiity, ambulation  strengthening, neuro re-ed, stairs    Communication                Safety/Cognition/ Behavioral Observations               Pain   Verbalizes Headache and pain to right hip. Morphine  q4 to manage pain   Will be free from pain   Assess pain qshift/prn    Skin   Surgical incision to lower back,(foam in place) abrasion to left wrist.   Skin will be free from infection  Assess skin for s/s of infection and promote healing      Discharge Planning:  Home with husband who can be there, but daughter in-law is going to assist during the day and daughter after  work in the evenings.    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 Patient will be on IV antibiotic x 6 weeks with EOT 11/1. Patient with nausea/vomiting/ constipation/ headache: medication adjusted by MD. Patient limited by poor endurance, incontinence and generalized weakness.   Patient on target to meet rehab goals: Currently patient needs supervision assistance with upper body care and minimal assistance with lower body care. Patient needs CGA with transfers and ambulation. Overall goals at discharge are set for Mod I  assistance.   *See Care Plan and progress notes for long and short-term goals.   Revisions to Treatment Plan:  Timed toileting PICC line Back precautions  Teaching Needs: Safety, medications, transfers , toileting , etc   Current Barriers to Discharge: Decreased caregiver support, Home enviroment access/layout, and IV antibiotics  Possible Resolutions to Barriers: Family Education Home health follow up DME: patient has DME at home IV antibiotics set up     Medical Summary Current Status: IV ABBX til 11/1; bacteremia; lipoid meningitis; PO ABX for 6 additional months; CRP 1.8 and ESR 67; severe HA's- N/V this AM  Barriers to Discharge: Infection/IV Antibiotics;Behavior/Mood;Medical stability;Morbid Obesity;Self-care education;Weight bearing restrictions;Complicated Wound;Incontinence  Barriers to Discharge Comments: l;imited by  HA's- staples still in place; voiding timed? Drainage and IV ABX- generalized weakness and poor endurance Possible Resolutions to [employer]: d/c staples- order timed voiding;  will look into getting staples out- will change Compazine  to Zofran ?- d/c 9/23   Continued Need for Acute Rehabilitation Level of Care: The patient requires daily medical management by a physician with specialized training in physical medicine and rehabilitation for the  following reasons: Direction of a multidisciplinary physical rehabilitation program to maximize functional independence : Yes Medical management of patient stability for increased activity during participation in an intensive rehabilitation regime.: Yes Analysis of laboratory values and/or radiology reports with any subsequent need for medication adjustment and/or medical intervention. : Yes   I attest that I was present, lead the team conference, and concur with the assessment and plan of the team.   Gretel Cantu Gayo 03/01/2024, 1136 am

## 2024-03-01 NOTE — Progress Notes (Signed)
 Physical Therapy Session Note  Patient Details  Name: Diane Watkins MRN: [id number] Date of Birth: 1950/11/02  Today's Date: 02/29/2024 PT Individual Time: 1303-1320 PT Individual Time Calculation (min): 17 min And PT Individual Time: 1340-1402  PT Individual Time Calculation (min): 22 min And PT Missed Time: 21 Minutes Missed Time Reason: Nursing Care  Short Term Goals: Week 1:  PT Short Term Goal 1 (Week 1): =LTG  Skilled Therapeutic Interventions/Progress Updates:  Patient seated on toilet upon on entrance to room. Patient alert and agreeable to PT session. Pt's DIL present and has assisted pt to toilet. IV in PICC line running.   Patient with minimal pain complaint at start of session.  Pt requests to change personal gown prior to session. With IV running, pt unable to change. RN notified and requested IV team to remove line.   Therapeutic Activity: Transfers: Pt performed sit<>stand and stand pivot transfers throughout session with SBA/ CGA provided by DIL and SBA from therapist. No cueing to DIL for technique/ safety. DIL signed off to provide assist for pt in room.  Once IV abx removed, pt is able to doff old gown and then don new gown with supervision.   Gait Training:  Pt ambulated in-room distances with supervision using RW and then 18' x2 using RW with close supervision. Demonstrated slow but consistent pace with low but adequate step height. Provided vc/ tc for upright posture, proximity to RW and decreased pressure downward into RW.  Neuromuscular Re-ed: NMR facilitated during session with focus on standing balance. Pt guided in sit<>stand training with focus on technique. Is able to relate 3/3 movement precautions correctly. Then educated in sequencing of forward scoot, posterior foot placement, core tightening, then forward lean with hip hinge rather than flexion of trunk. Initially pt c/o pain around area of R SI joint. Improves in blocked practice.  NMR performed  for improvements in motor control and coordination, balance, sequencing, judgement, and self confidence/ efficacy in performing all aspects of mobility at highest level of independence.   Patient seated on EOB at end of session with brakes locked, no alarm set, and all needs within reach.   Therapy Documentation Precautions:  Precautions Precautions: Back, Fall Precaution Booklet Issued: No Recall of Precautions/Restrictions: Intact Precaution/Restrictions Comments: able to verbalize 3/3 precautions Other Brace: no brace needed per orders Restrictions Weight Bearing Restrictions Per Provider Order: No  Pain:  Light pain in back with certain movements but is able to improve with repositioning.    Therapy/Group: Individual Therapy  Mliss DELENA Milliner PT, DPT, CSRS 02/29/2024, 4:17 PM

## 2024-03-01 NOTE — Progress Notes (Signed)
 Physical Therapy Session Note  Patient Details  Name: Diane Watkins MRN: [id number] Date of Birth: [birth year]  Today's Date: 03/01/2024 PT Individual Time: 0830-0915 PT Individual Time Calculation (min): 45 min  PT Individual Time: 1030-1130 PT Individual Time Calculation (min): 60 min  PT Individual Time: 1500-1530 PT Individual Time Calculation (min): 30 min   Short Term Goals: Week 1:  PT Short Term Goal 1 (Week 1): =LTG  Skilled Therapeutic Interventions/Progress Updates:    Pt sitting EOB when PT arrived, pt agreeable for session, however, c/o nausea and HA's, vomiting this morning, but states, it's getting better.  Pt requesting an Ensure prior to session.  Pt denies LBP.  Pt unable to sleep due to HA's last night, morphine  helped.  Pt began to cry, stating she feels weird and then vomited.  Nsg notified and provided medication for nausea/anxiety.  Soon after, pt felt better and willing to participate with PT. Pt sat EOB to donn underwear, Pt amb with RW to ortho gym, then main gym.  Pt reports she would like to do stairs today.  Pt's gait is slow, short strides with intermittent stops to correct posture.  Pt sat for extended rest break, provided cold towel for face/neck.  Pt performed x8 stair negotiation using x2 HR for support, pt able to verbalize proper LE sequencing, step to pattern ascending and descending. Pt requesting rest break.    Second session: Pt in bed asleep, easily awaken.  Pt states she has been sleeping since last PT session ended.  Pt willing to participate with PT for bed exercises, however, pt transferred supine->sit using bed railing for UE support, with CGA to drink ginger ale @ EOB.  No c/o pain, HA's.  Pt needed to urinate, amb from bed to bathroom with S, transfers with CGA on/off toilet. Pt able to stand and clean herself holding x1 HHA on RW with S. Pt amb with RW S >x400' with intermittent standing rest breaks, posture correction. Pt performed  sit<->stand x5 with proper sequencing, slowly mostly from feeling sluggish from medications.  Pt returned to room to sit EOB at end of session.   Third session:  Pt in bed with husband visiting.  Pt agreeable for session.  Pt IV intact and waiting for removal. Pt states she is having 3/10 HA, feeling weird, unable to eat any lunch.  Pt does not c/o nausea.  Pt transferred to EOB from supine with Supervision.  BP 146/62, HR 68.  Pt reports she is having UE tremors intermittently which makes her head hurt.  Pt sitting EOB and begins feeling anxious, started to light whimper stating she is feeling funny.  PT instructed pt on bed mobility safety, scooting to [facility], positioning for LB protection.  PT assessed pt's incision to low back, removed and replaced bandage.  No s/s of infection, incision edges are clean, stitches intact. Nsg notified of pt's anxiety, unable to give medication for another 3-4 hours.  Pt agreed to return to bed to relax with deep breathing ex's.  Pt in bed, items within reach, husband present.    Therapy Documentation Precautions:  Precautions Precautions: Back, Fall Precaution Booklet Issued: No Recall of Precautions/Restrictions: Intact Precaution/Restrictions Comments: able to verbalize 3/3 precautions Other Brace: no brace needed per orders Restrictions Weight Bearing Restrictions Per Provider Order: No  Pain: no pain Pain Assessment Pain Scale: 0-10 Pain Score: 0-No pain    Therapy/Group: Individual Therapy  Arland GORMAN Fast 03/01/2024, 10:25 AM

## 2024-03-01 NOTE — Discharge Instructions (Addendum)
 Inpatient Rehab Discharge Instructions  Diane Watkins Discharge date and time: 03/08/24   Activities/Precautions/ Functional Status: Activity: no lifting, driving, or strenuous exercise till cleared by MD Diet: low fat, low cholesterol diet Wound Care: keep wound clean and dry   Functional status:  ___ No restrictions     ___ Walk up steps independently _X__ 24/7 supervision/assistance   ___ Walk up steps with assistance ___ Intermittent supervision/assistance  ___ Bathe/dress independently ___ Walk with walker     ___ Bathe/dress with assistance ___ Walk Independently    ___ Shower independently ___ Walk with assistance    _X__ Shower with assistance _X__ No alcohol      ___ Return to work/school ________   Special Instructions:  COMMUNITY REFERRALS UPON DISCHARGE:    Home Health:   PT    OT      RN                Agency:[facility]   Phone:[phone]   Medical Equipment/Items Ordered:NONE HAS ALL NEEDED EQUIPMENT FROM PAST ADMISSIONS                                                 Agency/Supplier:NA   My questions have been answered and I understand these instructions. I will adhere to these goals and the provided educational materials after my discharge from the hospital.  Patient/Caregiver Signature _______________________________ Date __________  Clinician Signature _______________________________________ Date __________  Please bring this form and your medication list with you to all your follow-up doctor's appointments.

## 2024-03-02 NOTE — Progress Notes (Signed)
 Physical Therapy Session Note  Patient Details  Name: Diane Watkins MRN: [id number] Date of Birth: 04-17-1951  Today's Date: 03/02/2024 PT Individual Time: 1430-1510 PT Individual Time Calculation (min): 40 min   Short Term Goals: Week 1:  PT Short Term Goal 1 (Week 1): =LTG  Skilled Therapeutic Interventions/Progress Updates:      Pt sitting up in wheelchair - husband present throughout session for active observation. Patient reports 3/10 R hip pain, defers need for any medication. IV team present to disconnect PICC line.   Patient completes sit<>stand at supervision level using the RW. She ambulates with supervision from her room to ortho gym, ~65ft, with RW. Assisted onto the Nustep - completed x15 minutes at L2 resistance with BUE/BLE - encouraged full AROM on her RLE, using assistive ROM with her hands.   Worked on [employer] gait training with the ramp and mulch pit - completed at supervision level with RW on ramp and with hand rail support on mulch pit. Pt reports she's used to being outside and walking around barefoot - enjoys the outdoors.   Patient returned to her room and was left sitting up in her recliner. Left with her needs met, husband present.   Therapy Documentation Precautions:  Precautions Precautions: Back, Fall Precaution Booklet Issued: No Recall of Precautions/Restrictions: Intact Precaution/Restrictions Comments: able to verbalize 3/3 precautions Other Brace: no brace needed per orders Restrictions Weight Bearing Restrictions Per Provider Order: No General:       Therapy/Group: Individual Therapy  Sherlean SHAUNNA Perks 03/02/2024, 7:54 AM

## 2024-03-02 NOTE — Progress Notes (Signed)
 Physical Therapy Session Note  Patient Details  Name: Diane Watkins MRN: [id number] Date of Birth: 1951/03/13  Today's Date: 03/02/2024 PT Individual Time: 0945-1100 PT Individual Time Calculation (min): 75 min   Short Term Goals: Week 1:  PT Short Term Goal 1 (Week 1): =LTG  Skilled Therapeutic Interventions/Progress Updates:    Pt in bed when  PT arrived, states she ready to walk!  Pt demonstrates good supine to sit transfers with supervision using bed railing for UE support.  Pt denies pain, dizziness, nausea this morning and is alert.  Pt amb with RW Supervision from room to main gym, slow cadence.  Spoke with PA, Pam regarding pt's incision bandage and was given ok to remove to air.  Balance/endurance ex with horse shoe games, 1st trial with RW for support, 2nd trial without RW support, 3rd trial non dominant hand without RW; pt amb to day gym with CGA/S.  Played large connect four in standing, second time had pt sit and standing, walk to game, walk back and sit; repeat until game ended. Pt able to demonstrate good dynamic balance, reaching forward, STS without RW.  Pt amb back to room, into recliner with all items within reach.   Therapy Documentation Precautions:  Precautions Precautions: Back, Fall Precaution Booklet Issued: No Recall of Precautions/Restrictions: Intact Precaution/Restrictions Comments: able to verbalize 3/3 precautions Other Brace: no brace needed per orders Restrictions Weight Bearing Restrictions Per Provider Order: No    Pain: Pain Assessment Pain Scale: 0-10 Pain Score: 0 Pain Location:    Therapy/Group: Individual Therapy  Arland GORMAN Fast 03/02/2024, 10:53 AM

## 2024-03-02 NOTE — Plan of Care (Signed)
  Problem: Consults Goal: RH GENERAL PATIENT EDUCATION Description: See Patient Education module for education specifics. Outcome: Progressing   Problem: RH BOWEL ELIMINATION Goal: RH STG MANAGE BOWEL WITH ASSISTANCE Description: STG Manage Bowel with Assistance. Outcome: Progressing   Problem: RH BLADDER ELIMINATION Goal: RH STG MANAGE BLADDER WITH ASSISTANCE Description: STG Manage Bladder With  mod I Assistance Outcome: Progressing   Problem: RH SKIN INTEGRITY Goal: RH STG SKIN FREE OF INFECTION/BREAKDOWN Description: Manage skin free of infection with mod I assistance Outcome: Progressing   Problem: RH SAFETY Goal: RH STG ADHERE TO SAFETY PRECAUTIONS W/ASSISTANCE/DEVICE Description: STG Adhere to Safety Precautions With  mod I Assistance/Device. Outcome: Progressing   Problem: RH PAIN MANAGEMENT Goal: RH STG PAIN MANAGED AT OR BELOW PT'S PAIN GOAL Description: < 4 w/ prns  Outcome: Progressing   Problem: RH KNOWLEDGE DEFICIT GENERAL Goal: RH STG INCREASE KNOWLEDGE OF SELF CARE AFTER HOSPITALIZATION Description: Manage increase knowledge deficits f self care after hospitalization with  mod I assistance from spouse using educational materials provided Outcome: Progressing   Problem: Education: Goal: Ability to describe self-care measures that may prevent or decrease complications (Diabetes Survival Skills Education) will improve Outcome: Progressing Goal: Individualized Educational Video(s) Outcome: Progressing   Problem: Coping: Goal: Ability to adjust to condition or change in health will improve Outcome: Progressing   Problem: Fluid Volume: Goal: Ability to maintain a balanced intake and output will improve Outcome: Progressing   Problem: Health Behavior/Discharge Planning: Goal: Ability to identify and utilize available resources and services will improve Outcome: Progressing Goal: Ability to manage health-related needs will improve Outcome: Progressing    Problem: Metabolic: Goal: Ability to maintain appropriate glucose levels will improve Outcome: Progressing   Problem: Nutritional: Goal: Maintenance of adequate nutrition will improve Outcome: Progressing Goal: Progress toward achieving an optimal weight will improve Outcome: Progressing   Problem: Skin Integrity: Goal: Risk for impaired skin integrity will decrease Outcome: Progressing   Problem: Tissue Perfusion: Goal: Adequacy of tissue perfusion will improve Outcome: Progressing

## 2024-03-02 NOTE — Progress Notes (Signed)
 PROGRESS NOTE   Subjective/Complaints:  Pt reports  Still having jerking of arms-  Pain in R posterior hip due torn muscle per pt.  Improving slightly- No headache in last 24 hours-     ROS: Per HPI   Pt denies SOB, abd pain, CP, N/V/C/D, and vision changes    + Constipation --improved + headache mostly at night--doing better in last 24 hours + Nausea and vomiting -improved + Nocturia + Spasms  Objective:   No results found.   Recent Labs    02/29/24 0403  WBC 12.7*  HGB 8.9*  HCT 27.1*  PLT 491*   Recent Labs    02/29/24 0403  NA 136  K 4.1  CL 102  CO2 26  GLUCOSE 208*  BUN 14  CREATININE 0.91  CALCIUM  8.9    Intake/Output Summary (Last 24 hours) at 03/02/2024 1617 Last data filed at 03/02/2024 1326 Gross per 24 hour  Intake 720 ml  Output --  Net 720 ml        Physical Exam: Vital Signs Blood pressure 115/65, pulse 91, temperature 97.9 F (36.6 C), temperature source Oral, resp. rate 18, height 5' 2 (1.575 m), weight 93 kg, SpO2 98%.     General: awake, alert, appropriate,  sitting up in bedside chair; NAD HENT: conjugate gaze; oropharynx moist CV: regular rate and rhythm; no JVD Pulmonary: CTA B/L; no W/R/R- good air movement GI: soft, NT, ND, (+)BS- normoactive Psychiatric: appropriate Neurological: Ox3 Skin: incision has a little localized erythema, secondary to sutures- no drainage mild swelling Extremities: No clubbing, cyanosis; 1+ bilateral nonpitting edema Psych: Flat affect, but appropriate. Skin: Surgical incision with nonadherent dressing,  covered in clean and dry ABD pad  Neuro:  Awake, alert, oriented x 3. No apparent cognitive deficits. 5 out of 5 bilateral upper and lower extremities.  Sensation intact in bilateral legs to light touch    Assessment/Plan: 1. Functional deficits which require 3+ hours per day of interdisciplinary therapy in a  comprehensive inpatient rehab setting. Physiatrist is providing close team supervision and 24 hour management of active medical problems listed below. Physiatrist and rehab team continue to assess barriers to discharge/monitor patient progress toward functional and medical goals  Care Tool:  Bathing    Body parts bathed by patient: Left upper leg, Right arm, Left arm, Chest, Abdomen, Front perineal area, Buttocks, Right upper leg, Face   Body parts bathed by helper: Right lower leg, Left lower leg, Buttocks     Bathing assist Assist Level: Moderate Assistance - Patient 50 - 74%     Upper Body Dressing/Undressing Upper body dressing        Upper body assist Assist Level: Supervision/Verbal cueing    Lower Body Dressing/Undressing Lower body dressing      What is the patient wearing?: Underwear/pull up     Lower body assist Assist for lower body dressing: Contact Guard/Touching assist     Toileting Toileting    Toileting assist Assist for toileting: Contact Guard/Touching assist     Transfers Chair/bed transfer  Transfers assist     Chair/bed transfer assist level: Supervision/Verbal cueing     Locomotion Ambulation   Ambulation  assist      Assist level: Supervision/Verbal cueing Assistive device: Walker-rolling Max distance: >400'   Walk 10 feet activity   Assist     Assist level: Supervision/Verbal cueing Assistive device: Walker-rolling   Walk 50 feet activity   Assist    Assist level: Supervision/Verbal cueing Assistive device: Walker-rolling    Walk 150 feet activity   Assist    Assist level: Supervision/Verbal cueing Assistive device: Walker-rolling    Walk 10 feet on uneven surface  activity   Assist Walk 10 feet on uneven surfaces activity did not occur: Safety/medical concerns         Wheelchair     Assist Is the patient using a wheelchair?: Yes Type of Wheelchair: Manual    Wheelchair assist level: Total  Assistance - Patient < 25%      Wheelchair 50 feet with 2 turns activity    Assist        Assist Level: Total Assistance - Patient < 25%   Wheelchair 150 feet activity     Assist      Assist Level: Total Assistance - Patient < 25%   Blood pressure 115/65, pulse 91, temperature 97.9 F (36.6 C), temperature source Oral, resp. rate 18, height 5' 2 (1.575 m), weight 93 kg, SpO2 98%.  Medical Problem List and Plan: 1. 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  - In the afternoon patient was noted to have some increased serosanguineous drainage from inferior portion of incision.  Surgeon was notified to review, continue to monitor  9-13: Drainage appears stable, mild.  Continue current management.--Stable 9-14  9/15 seen by ortho today- continue current regimen    D/c set for 9/23 Con't CIR PT and OT 2.  Antithrombotics: -DVT/anticoagulation:  Pharmaceutical: Lovenox              -antiplatelet therapy: N/A   3. Pain Management: Fioricet  and hydrocodone  prn for pain. Kpad ordered             --gabapentin  added at nights for hip pain             --Voltaren  gel added to right hip for local measures.   - 9/11 currently has tramadol  and hydrocodone  as needed for pain, metoprolol  started in may also benefit for headaches  -9/11 headaches improved continue current regimen   - 9/13: Headache overnight increased, no PRNs until 6 AM this morning, occurs mostly at nighttime and with pressure on her head.  Start Topamax  25 mg nightly given she was not asking for PRNs despite headache.  9/15 HA doing better, continue current   9/16 - HA's again yesterday afternoon and overnight- not this AM-  last pain meds just before midnight.   9/17- hasn't needed Morphine  in 24+ hours- I suggested taking tramadol  prn before therapy for R hip pain.  4. Mood/Behavior/Sleep: LCSW to follow for evaluation and support.              --Monitor sleep hygiene. Has had issues with insomnia. Will continue Xanax  prn for anxiety             -  antipsychotic agents: N/A  5. Neuropsych/cognition: patient is able to make decision on her own behalf, medically.   6. Skin/Wound Care:  Routine pressure relief measures.   - 9-14: Lower part of surgical site with decreasing amount of drainage through the weekend, serous, stable   7. Fluids/Electrolytes/Nutrition: Monitor I/O. Encourage fluid intake.   8. 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 Vitamin C supplement started  - 9-14: Some subclinical fevers yesterday, on chart review has been getting these intermittently.  Having some urinary urgency and lower abdominal pain, so we will get urinalysis today, however should be decently covered with current antibiotics.  UA does not indicate infection   9/16- CRP 1.8 and ESR 67- first time checked- ordered qweek 9. T2DM: Hgb A1C-7.0. d/c CBGs checks and ISS since well controlled  CBG (last 3)  No results for input(s): GLUCAP in the last 72 hours.     10. Leucocytosis improving from rise to 17.7-->11.4.  - WBC a little higher at 12.7 continue to monitor  9-13: WBC stable, 11-12.    9-14: Some subclinical fevers, 99.4, UA as above and CBC in a.m.  9/15 WBC 12.7, continue to monitor for signs of infection, Tmax 99.7   9/16- Tm 99.3- will recheck Labs Thursday unless things worse clinically.   9/17- Tm 99.0-  11. 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    12. Anemia of chronic illness: Recheck CBC in am. Hgb trending  down from 10.7-->9.8.             --monitor for signs of bleeding.   - Hemoglobin overall stable continue to follow  9-13: Hemoglobin down slightly to 8.8.   - iron WNL, TIBC low, consistent with anemia of chronic disease.   - FOBT negative  9/15 stable hgb at 8.9   13.  Abnormal LFTs: Recheck in am.   -9/11 improved today to AST 57, ALT 60   9/17- will change to check qM/H 14. Rectosigmoid colitis?: Monitor intake. On Senna S. GIP negative.   - 9/12 will try to avoid stimulant laxatives  9-13: See #18    15. 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    16. Tachycardia: magnesium  supplement started  - 9/11 mild intermittent tachycardia, may be pain related.  See #17  -9/12 intermittent mild tachycardia, continue current regimen and monitor for now   17. HTN: magnesium  supplement started  -9/11 metoprolol  12.5 mg twice daily  - 9/12 BP fair control overall, continue current regimen and monitor  - Ongoing fair control, monitor  -9/15 stable continue current regimen   9/17- BP was 150 systolic but overall, doing better    03/02/2024    1:42 PM 03/02/2024    3:54 AM 03/01/2024    8:34 PM  Vitals with BMI  Systolic 115 150 854  Diastolic 65 78 72  Pulse 91 89 96   18.  Constipation.  - 9/11 MiraLAX  34 g daily  -9/12 continue MiraLAX , will add lactulose .  Avoid stimulant laxatives when possible.  Will check abdomen x-ray to assess for stool burden  - 9-13: KUB without significant stool burden.  Ordered lactulose  for daily, docusate 200 mg BID.  Positive bowel movement this a.m.  After lactulose .  9/15 LBM today, improved   9/16-  LBM yesterday- feeling good  9/17- LBM yesterday   I spent a total of 36   minutes on total care today- >50% coordination of care- due to  D/w  pt and nursing- as well as going over orders, as well as examining incision LOS: 7 days A FACE TO FACE EVALUATION WAS PERFORMED  Mica Releford 03/02/2024, 4:17 PM

## 2024-03-02 NOTE — Progress Notes (Signed)
 Occupational Therapy Session Note  Patient Details  Name: Diane Watkins MRN: [id number] Date of Birth: Jan 22, 1951  Today's Date: 03/02/2024 OT Individual Time: 1103-1200 OT Individual Time Calculation (min): 57 min    Short Term Goals: Week 1:  OT Short Term Goal 1 (Week 1): STG=LTG d/t ELOS  Skilled Therapeutic Interventions/Progress Updates:   Pt received in the recliner with no c/o pain, agreeable to OT session. She stood from the recliner with CGA and used the RW for 10 ft of functional mobility into the bathroom with close (S). She completed 3/3 toileting tasks with (S) in standing, pt able to assume semi squat to reach thoroughly. She completed UB bathing/dressing sit > stand at the sink to don gown with close (S). She was able to use figure 4 positioning to don socks with set up assist. She completed 150 ft of functional mobility to the therapy gym with the RW with CGA. Pt completed standing level reciprocal tapping activity with no UE support using a 5 in step 3x15 repetitions. Activity performed to challenge dynamic standing balance and functional activity tolerance to simulate household threshold management and reduce fall risk. Pt required CGA, graded activity up by providing HHA the first trial and then progressing to no UE support. She returned to her room following and was left sitting up in the recliner with all needs met.   Therapy Documentation Precautions:  Precautions Precautions: Back, Fall Precaution Booklet Issued: No Recall of Precautions/Restrictions: Intact Precaution/Restrictions Comments: able to verbalize 3/3 precautions Other Brace: no brace needed per orders Restrictions Weight Bearing Restrictions Per Provider Order: No  Therapy/Group: Individual Therapy  Diane Watkins 03/02/2024, 11:10 AM

## 2024-03-03 LAB — CBC
HCT: 26.7 % — ABNORMAL LOW (ref 36.0–46.0)
Hemoglobin: 8.6 g/dL — ABNORMAL LOW (ref 12.0–15.0)
MCH: 30.7 pg (ref 26.0–34.0)
MCHC: 32.2 g/dL (ref 30.0–36.0)
MCV: 95.4 fL (ref 80.0–100.0)
Platelets: 478 K/uL — ABNORMAL HIGH (ref 150–400)
RBC: 2.8 MIL/uL — ABNORMAL LOW (ref 3.87–5.11)
RDW: 14 % (ref 11.5–15.5)
WBC: 10.5 K/uL (ref 4.0–10.5)
nRBC: 0 % (ref 0.0–0.2)

## 2024-03-03 LAB — COMPREHENSIVE METABOLIC PANEL WITH GFR
ALT: 83 U/L — ABNORMAL HIGH (ref 0–44)
AST: 49 U/L — ABNORMAL HIGH (ref 15–41)
Albumin: 2.3 g/dL — ABNORMAL LOW (ref 3.5–5.0)
Alkaline Phosphatase: 63 U/L (ref 38–126)
Anion gap: 12 (ref 5–15)
BUN: 16 mg/dL (ref 8–23)
CO2: 25 mmol/L (ref 22–32)
Calcium: 9 mg/dL (ref 8.9–10.3)
Chloride: 101 mmol/L (ref 98–111)
Creatinine, Ser: 0.85 mg/dL (ref 0.44–1.00)
GFR, Estimated: >60 mL/min (ref >60)
Glucose, Bld: 126 mg/dL — ABNORMAL HIGH (ref 70–99)
Potassium: 4.3 mmol/L (ref 3.5–5.1)
Sodium: 138 mmol/L (ref 135–145)
Total Bilirubin: 0.4 mg/dL (ref 0.0–1.2)
Total Protein: 5.6 g/dL — ABNORMAL LOW (ref 6.5–8.1)

## 2024-03-03 MED ORDER — TRAZODONE HCL 50 MG PO TABS: 25.0000 mg | ORAL_TABLET | Freq: Every evening | ORAL | Status: DC | PRN

## 2024-03-03 MED ORDER — DOCUSATE SODIUM 100 MG PO CAPS
100.0000 mg | ORAL_CAPSULE | Freq: Every day | ORAL | Status: DC
Start: 2024-03-04 — End: 2024-03-08
  Administered 2024-03-04 – 2024-03-08 (×4): 100 mg via ORAL
  Filled 2024-03-03 (×5): qty 1

## 2024-03-03 NOTE — Progress Notes (Signed)
 Physical Therapy Weekly Progress Note  Patient Details  Name: Diane Watkins MRN: [id number] Date of Birth: 1950-08-07  Beginning of progress report period: March 03, 2024 End of progress report period: March 03, 2024  Today's Date: 03/03/2024 PT Individual Time: 9069-8954 PT Individual Time Calculation (min): 75 min   Patient has met 4 of 8 short term goals.    Patient continues to demonstrate the following deficits muscle weakness and decreased cardiorespiratoy endurance and therefore will continue to benefit from skilled PT intervention to increase functional independence with mobility. Pt performing bed mobility with supervision with use of bed features,  sit to stand supervision with RW, gait x150 feet without RW and CGA with navigating straight away and navigating turns. Pt is navigating x8 3 steps, x4 6 steps with CGA and step to pattern with x1 HR.   Patient continues to demonstrate the following deficits muscle weakness, decreased cardiorespiratoy endurance and decreased standing balance without support, decreased postural control, decreased balance strategies and therefore will continue to benefit from skilled PT intervention to increase functional independence with mobility.   Patient progressing toward long term goals..  Continue plan of care.  PT Short Term Goals Week 1:  PT Short Term Goal 1 (Week 1): =LTG  Skilled Therapeutic Interventions/Progress Updates:    Pt sitting EOB when PT arrived, pt agreeable for session.  Pt states morphine  gave her bad dreams and wasn't able to sleep last night.  Pt denies pain, HA's today. Husband arrives mid way through session.  Pt amb to main gym with RW Supervision.  Transfers: Car transfers with Supervision, verbal cueing for sequencing.  Gait: Pt amb to main gym without device with CGA, pt slow and trepidatious (falling).  Decreased LLE stride from decreased RLE mid stance acceptance. Pt denies right hip/glute pain during  gait. Cueing needed to demonstrate heel to toe pattern, upright posture. Pt amb ~x225', by the end, pt's confidence increased and noted increased stride length. Pt very fatigued post activity, required seated rest break.  Balance: standing on non compliant surface, static with EO/EC to facilitate ankle/hip strategies.  CGA/min A with EC for LOB posteriorly. Weight shifting side to side, front to back challenging outside BOS.  Obstacle course set up with cones, 4 step, [employer].  Pt navigating around/over.  Alternating step touch on cone without device.  Standing basketball throws with BUE x10 reps, no LOB, good use of LE's to push up.   Pt walked back to room with RW Supervision.  Pt transferred to EOB, all items within reach, husband present.    Therapy Documentation Precautions:  Precautions Precautions: Back, Fall Precaution Booklet Issued: No Recall of Precautions/Restrictions: Intact Precaution/Restrictions Comments: able to verbalize 3/3 precautions Other Brace: no brace needed per orders Restrictions Weight Bearing Restrictions Per Provider Order: No  Pain: denies pain      Therapy/Group: Individual Therapy  Arland GORMAN Fast 03/03/2024, 8:16 AM

## 2024-03-03 NOTE — Plan of Care (Signed)
  Problem: RH PAIN MANAGEMENT Goal: RH STG PAIN MANAGED AT OR BELOW PT'S PAIN GOAL Description: < 4 w/ prns  Outcome: Progressing   Problem: RH SKIN INTEGRITY Goal: RH STG SKIN FREE OF INFECTION/BREAKDOWN Description: Manage skin free of infection with mod I assistance Outcome: Progressing   Problem: Consults Goal: RH GENERAL PATIENT EDUCATION Description: See Patient Education module for education specifics. Outcome: Progressing

## 2024-03-03 NOTE — Evaluation (Signed)
 Recreational Therapy Assessment and Plan  Patient Details  Name: Diane Watkins MRN: [id number] Date of Birth: 1951-04-16 Today's Date: 03/03/2024  Rehab Potential:  Good ELOS:   d/ 9/23  Assessment   Hospital Problem: Principal Problem:   Postoperative CSF leak     Past Medical History:      Past Medical History:  Diagnosis Date   Arthritis     Diabetes mellitus without complication (HCC)     High cholesterol     History of hiatal hernia     Hyperlipidemia     Hypertension          Past Surgical History:       Past Surgical History:  Procedure Laterality Date   BIOPSY   07/24/2021    Procedure: BIOPSY;  Surgeon: Golda Claudis PENNER, MD;  Location: [facility];  Service: Endoscopy;;  polyp   CATARACT EXTRACTION W/PHACO Left 03/29/2018    Procedure: CATARACT EXTRACTION PHACO AND INTRAOCULAR LENS PLACEMENT (IOC);  Surgeon: Perley Hamilton, MD;  Location: [facility];  Service: Ophthalmology;  Laterality: Left;  CDE: 10.13   CHOLECYSTECTOMY       COLONOSCOPY WITH PROPOFOL  N/A 07/24/2021    Procedure: COLONOSCOPY WITH PROPOFOL ;  Surgeon: Golda Claudis PENNER, MD;  Location: [facility];  Service: Endoscopy;  Laterality: N/A;   ESOPHAGOGASTRODUODENOSCOPY (EGD) WITH PROPOFOL  N/A 07/24/2021    Procedure: ESOPHAGOGASTRODUODENOSCOPY (EGD) WITH PROPOFOL ;  Surgeon: Golda Claudis PENNER, MD;  Location: [facility];  Service: Endoscopy;  Laterality: N/A;   HARDWARE REMOVAL N/A 02/17/2024    Procedure: REMOVAL, HARDWARE;  Surgeon: Reyne Cordella SQUIBB, MD;  Location: [facility];  Service: Orthopedics;  Laterality: N/A;   INCISIONAL HERNIA REPAIR N/A 09/04/2021    Procedure: HERNIA REPAIR INCISIONAL W/MESH;  Surgeon: Mavis Anes, MD;  Location: [facility];  Service: General;  Laterality: N/A;   POLYPECTOMY   07/24/2021    Procedure: POLYPECTOMY;  Surgeon: Golda Claudis PENNER, MD;  Location: [facility];  Service: Endoscopy;;   TRANSFORAMINAL LUMBAR INTERBODY FUSION (TLIF) WITH PEDICLE SCREW FIXATION 1 LEVEL N/A  02/08/2024    Procedure: TRANSFORAMINAL LUMBAR INTERBODY FUSION (TLIF) WITH PEDICLE SCREW FIXATION 1 LEVEL;  Surgeon: Reyne Cordella SQUIBB, MD;  Location: [facility];  Service: Orthopedics;  Laterality: N/A;  Transforaminal lumber interbody fusion with pedicle screw fixation, lumbar 4-lumbar 5   TUBAL LIGATION       WOUND EXPLORATION N/A 02/17/2024    Procedure: WOUND EXPLORATION;  Surgeon: Reyne Cordella SQUIBB, MD;  Location: [facility];  Service: Orthopedics;  Laterality: N/A;  Exploration of wound, Repair of Spinal fluid Leak Lumbar four-five  Removal of screws and Replacement of same screws          Assessment & Plan Clinical Impression: Patient is a Diane Watkins. Dusenbury is a 73 year old female with history of T2DM, OA, HTN, GERD, transforaminal lumbar decompression w/fusion 02/08/24 and discharged on 02/10/24. She reported HA at discharge and returned to ED with severe unrelenting headaches.  CT head done revealing abnormal intracranial lipid material and small new CSF density subtentorial posterior fossa with question of lipoid meningitis. MRI lumbar spine showed suggestion of CSF leak with 117 ml paraspinal muscle fluid collection at level of posterior hardware.she underwent exploration of spine with repair of CSF leak and wound wash out. Neurology consulted for persistent HA with intermittent bouts of confusion and recommended supportive care and if persistent to evaluate for another CSF leak. IVF added for hydration due to ensure CSF pressure remains  normal as well as poor intake.  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    She had developed fever on 09/05 and started on Keflex  due to concerns of cellulitis. BC repeated revealing enterobacter colace. Nausea/vomiting felt to be due to constipation and treated with laxative. She has had right hip pain as well as issues with anxiety.  MRI hip   09/07showed no evidence of septic arthritis or osteomyelitis in pelvis or right hip, partial insertional tear right gluteus medius tendon and thickening/enhancement of rectosigmoid colon suspicious for colitis. ID consulted for input and antibiotics narrowed to cefepime  with recommendations of 8 weeks IV antibiotics from 09/07 with end date of 11/1 followed by 6 months of supression with cipro due to presence of hardware.    CT abdomen pelvis repeated last night and negative for colitis or acute findings. Intake remains poor with reports of abdominal discomfort and trying to self limit to liquids per patient. Severe pain right buttock and troc for past six months worse since injection for platelet exchange. Has had issues with disorientation at nights per DIL. Therapy has been working with patient  who is limited by HA, right hip pain, weakness and requires CGA/min assist for mobility and min to mod assist with ADL tasks. She was independent with RW prior to surgery andd CIR recommended due to functional decline. C/o right hip pain. Patient transferred to CIR on 02/24/2024 .     Pt presents with decreased activity tolerance, decreased functional mobility, decreased balance Limiting pt's independence with leisure/community pursuits.  Met with pt today to discuss TR services including leisure education, activity analysis/modifications and stress management.  Also discussed the importance of social, emotional, spiritual health in addition to physical health and their effects on overall health and wellness.  Husband presents.  Pt stated understanding.  Plan   No further TR Recommendations for other services: None   Discharge Criteria: Patient will be discharged from TR if patient refuses treatment 3 consecutive times without medical reason.  If treatment goals not met, if there is a change in medical status, if patient makes no progress towards goals or if patient is discharged from hospital.  The above  assessment, treatment plan, treatment alternatives and goals were discussed and mutually agreed upon: by patient  [name] 03/03/2024, 3:11 PM

## 2024-03-03 NOTE — Progress Notes (Signed)
 PROGRESS NOTE   Subjective/Complaints:  Pt took MSIR last night to try and sleep- had awful dreams and it felt crazy in her head.  Doesn't want to take again.  Tramadol  really helped pain with therapy with no side effects.  Doesn't have HA or last 48 hours.  Didn't take Xanax  til here- LBM x 6 yesterday..   Breakfast bad- also wants Boost from home.   ROS: Per HPI   Pt denies SOB, abd pain, CP, N/V/C/D, and vision changes   + Constipation --improved + headache mostly at night--doing better in last 24 hours + Nausea and vomiting -improved + Nocturia + Spasms  Objective:   No results found.   Recent Labs    03/03/24 0420  WBC 10.5  HGB 8.6*  HCT 26.7*  PLT 478*   Recent Labs    03/03/24 0420  NA 138  K 4.3  CL 101  CO2 25  GLUCOSE 126*  BUN 16  CREATININE 0.85  CALCIUM  9.0    Intake/Output Summary (Last 24 hours) at 03/03/2024 0933 Last data filed at 03/03/2024 0800 Gross per 24 hour  Intake 720 ml  Output --  Net 720 ml        Physical Exam: Vital Signs Blood pressure 108/62, pulse 76, temperature 97.6 F (36.4 C), resp. rate 18, height 5' 2 (1.575 m), weight 93 kg, SpO2 93%.      General: awake, alert, appropriate,  sitting up in EOB; didn't like breakfast- only ate cereal; NAD HENT: conjugate gaze; oropharynx moist CV: regular rate and rhythm; no JVD Pulmonary: CTA B/L; no W/R/R- good air movement GI: soft, NT, ND, (+)BS- slightly hyperactive Psychiatric: appropriate Neurological: Ox3  Skin: incision has a little localized erythema, secondary to sutures- no drainage mild swelling Extremities: No clubbing, cyanosis; 1+ bilateral nonpitting edema Psych: Flat affect, but appropriate. Skin: Surgical incision with nonadherent dressing,  covered in clean and dry ABD pad  Neuro:  Awake, alert, oriented x 3. No apparent cognitive deficits. 5 out of 5 bilateral upper and lower  extremities.  Sensation intact in bilateral legs to light touch    Assessment/Plan: 1. Functional deficits which require 3+ hours per day of interdisciplinary therapy in a comprehensive inpatient rehab setting. Physiatrist is providing close team supervision and 24 hour management of active medical problems listed below. Physiatrist and rehab team continue to assess barriers to discharge/monitor patient progress toward functional and medical goals  Care Tool:  Bathing    Body parts bathed by patient: Left upper leg, Right arm, Left arm, Chest, Abdomen, Front perineal area, Buttocks, Right upper leg, Face   Body parts bathed by helper: Right lower leg, Left lower leg, Buttocks     Bathing assist Assist Level: Moderate Assistance - Patient 50 - 74%     Upper Body Dressing/Undressing Upper body dressing        Upper body assist Assist Level: Supervision/Verbal cueing    Lower Body Dressing/Undressing Lower body dressing      What is the patient wearing?: Underwear/pull up     Lower body assist Assist for lower body dressing: Contact Guard/Touching assist     Toileting Toileting  Toileting assist Assist for toileting: Contact Guard/Touching assist     Transfers Chair/bed transfer  Transfers assist     Chair/bed transfer assist level: Supervision/Verbal cueing     Locomotion Ambulation   Ambulation assist      Assist level: Supervision/Verbal cueing Assistive device: Walker-rolling Max distance: >400'   Walk 10 feet activity   Assist     Assist level: Supervision/Verbal cueing Assistive device: Walker-rolling   Walk 50 feet activity   Assist    Assist level: Supervision/Verbal cueing Assistive device: Walker-rolling    Walk 150 feet activity   Assist    Assist level: Supervision/Verbal cueing Assistive device: Walker-rolling    Walk 10 feet on uneven surface  activity   Assist Walk 10 feet on uneven surfaces activity did not  occur: Safety/medical concerns         Wheelchair     Assist Is the patient using a wheelchair?: Yes Type of Wheelchair: Manual    Wheelchair assist level: Total Assistance - Patient < 25%      Wheelchair 50 feet with 2 turns activity    Assist        Assist Level: Total Assistance - Patient < 25%   Wheelchair 150 feet activity     Assist      Assist Level: Total Assistance - Patient < 25%   Blood pressure 108/62, pulse 76, temperature 97.6 F (36.4 C), resp. rate 18, height 5' 2 (1.575 m), weight 93 kg, SpO2 93%.  Medical Problem List and Plan: 1. 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  - In the afternoon patient was noted to have some increased serosanguineous drainage from inferior portion of incision.  Surgeon was notified to review, continue to monitor  9-13: Drainage appears stable, mild.  Continue current management.--Stable 9-14  9/15 seen by ortho today- continue current regimen    D/c set for 9/23 Con't CIR PT and OT 2.  Antithrombotics: -DVT/anticoagulation:  Pharmaceutical: Lovenox              -antiplatelet therapy: N/A   3. Pain Management: Fioricet  and hydrocodone  prn for pain. Kpad ordered             --gabapentin  added at nights for hip pain             --Voltaren  gel added to right hip for local measures.   - 9/11 currently has tramadol  and hydrocodone  as needed for pain, metoprolol  started in may also benefit for headaches  -9/11 headaches improved continue current regimen   - 9/13: Headache overnight increased, no PRNs until 6 AM this morning, occurs mostly at nighttime and with pressure on her head.  Start Topamax  25 mg nightly given she  was not asking for PRNs despite headache.  9/15 HA doing better, continue current   9/16 - HA's again yesterday afternoon and overnight- not this AM- last pain meds just before midnight.   9/17- hasn't needed Morphine  in 24+ hours- I suggested taking tramadol  prn before therapy for R hip pain. 9/18- pt doing well with  Tramadol - says doesn't want to take MSIR again- felt so out of it- said next time, won't take again- d/c'd meds  4. Mood/Behavior/Sleep: LCSW to follow for evaluation and support.              --Monitor sleep hygiene. Has had issues with insomnia. Will continue Xanax  prn for anxiety             -antipsychotic agents: N/A  5. Neuropsych/cognition: patient is able to make decision on her own behalf, medically.   6. Skin/Wound Care:  Routine pressure relief measures.   - 9-14: Lower part of surgical site with decreasing amount of drainage through the weekend, serous, stable   7. Fluids/Electrolytes/Nutrition: Monitor I/O. Encourage fluid intake.   8. 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 Vitamin C supplement started  - 9-14: Some subclinical fevers yesterday, on chart review has been getting these intermittently.  Having some urinary urgency and lower abdominal pain, so we will get urinalysis today, however should be decently covered with current antibiotics.  UA does not indicate infection   9/16- CRP 1.8 and ESR 67- first time checked- ordered qweek 9. T2DM: Hgb A1C-7.0. d/c CBGs checks and ISS since well controlled  CBG (last 3)  No results for input(s): GLUCAP in the last 72 hours.     10. Leucocytosis improving from rise to 17.7-->11.4.  - WBC a little higher at 12.7 continue to monitor  9-13: WBC stable, 11-12.    9-14: Some subclinical fevers, 99.4, UA as above and CBC in a.m.  9/15 WBC 12.7, continue to monitor for signs of infection, Tmax 99.7   9/16- Tm 99.3- will recheck Labs  Thursday unless things worse clinically.   9/17- Tm 99.0-  11. 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    12. Anemia of chronic illness: Recheck CBC in am. Hgb trending down from 10.7-->9.8.             --monitor for signs of bleeding.   - Hemoglobin overall stable continue to follow  9-13: Hemoglobin down slightly to 8.8.   - iron WNL, TIBC low, consistent with anemia of chronic disease.   - FOBT negative  9/15 stable hgb at 8.9   9/18- Hb 8.6- slightly dipped- will monitor 2x/week 13.  Abnormal LFTs: Recheck in am.   -9/11 improved today to AST 57, ALT 60   9/17- will change to check qM/H  9/18- AST 49 and ALT 83- AST down from 57 and ALT up from 60- will recheck next Monday- if still up, will call Pharmacy- don't see any meds causing this myself- and off tylenol  14. Rectosigmoid colitis?: Monitor intake. On Senna S. GIP negative.   - 9/12 will try to avoid stimulant laxatives  9-13: See #18    15. 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    16. Tachycardia: magnesium  supplement started  - 9/11 mild intermittent tachycardia, may be pain related.  See #17  -9/12 intermittent mild tachycardia, continue current regimen and monitor for now   17. HTN: magnesium  supplement started  -9/11 metoprolol  12.5 mg twice daily  - 9/12 BP fair control overall, continue current regimen and monitor  - Ongoing fair control, monitor  -9/15 stable continue current regimen   9/17- 9/18BP was 150  systolic but overall, doing better    03/03/2024    5:50 AM 03/02/2024    8:03 PM 03/02/2024    1:42 PM  Vitals with BMI  Systolic 108 151 884  Diastolic 62 72 65  Pulse 76 103 91   18.  Constipation.  - 9/11 MiraLAX  34 g daily  -9/12 continue MiraLAX , will add lactulose .  Avoid stimulant laxatives when possible.  Will check abdomen x-ray to assess for stool  burden  - 9-13: KUB without significant stool burden.  Ordered lactulose  for daily, docusate 200 mg BID.  Positive bowel movement this a.m.  After lactulose .  9/15 LBM today, improved   9/16- LBM yesterday- feeling good  9/17- LBM yesterday  9/18- LBM 5-6x yesterday- will decrease Colace from 200 mg BID to 100 mg daily. Told pt to hold today and restart lower dose tomorrow.  19. Insomnia  9/18- will try Trazodone  25-50 mg at bedtime prn- discouraged pt to take Xananx for sleep esp since didn't take at home.    I spent a total of  41  minutes on total care today- >50% coordination of care- due to  D/w pt about pain meds; bowels and sleep- also d/w nursing.  Made med changes Also reviewed labs and ordered more labs  LOS: 8 days A FACE TO FACE EVALUATION WAS PERFORMED  Zaleigh Bermingham 03/03/2024, 9:33 AM

## 2024-03-03 NOTE — Plan of Care (Signed)
  Problem: Consults Goal: RH GENERAL PATIENT EDUCATION Description: See Patient Education module for education specifics. Outcome: Progressing   Problem: RH BOWEL ELIMINATION Goal: RH STG MANAGE BOWEL WITH ASSISTANCE Description: STG Manage Bowel with Assistance. Outcome: Progressing   Problem: RH BLADDER ELIMINATION Goal: RH STG MANAGE BLADDER WITH ASSISTANCE Description: STG Manage Bladder With  mod I Assistance Outcome: Progressing   Problem: RH SKIN INTEGRITY Goal: RH STG SKIN FREE OF INFECTION/BREAKDOWN Description: Manage skin free of infection with mod I assistance Outcome: Progressing

## 2024-03-03 NOTE — Plan of Care (Signed)
  Problem: Consults Goal: RH GENERAL PATIENT EDUCATION Description: See Patient Education module for education specifics. Outcome: Progressing   Problem: RH BOWEL ELIMINATION Goal: RH STG MANAGE BOWEL WITH ASSISTANCE Description: STG Manage Bowel with Assistance. Outcome: Progressing   Problem: RH BLADDER ELIMINATION Goal: RH STG MANAGE BLADDER WITH ASSISTANCE Description: STG Manage Bladder With  mod I Assistance Outcome: Progressing   Problem: RH SKIN INTEGRITY Goal: RH STG SKIN FREE OF INFECTION/BREAKDOWN Description: Manage skin free of infection with mod I assistance Outcome: Progressing   Problem: RH SAFETY Goal: RH STG ADHERE TO SAFETY PRECAUTIONS W/ASSISTANCE/DEVICE Description: STG Adhere to Safety Precautions With  mod I Assistance/Device. Outcome: Progressing   Problem: RH PAIN MANAGEMENT Goal: RH STG PAIN MANAGED AT OR BELOW PT'S PAIN GOAL Description: < 4 w/ prns  Outcome: Progressing   Problem: RH KNOWLEDGE DEFICIT GENERAL Goal: RH STG INCREASE KNOWLEDGE OF SELF CARE AFTER HOSPITALIZATION Description: Manage increase knowledge deficits f self care after hospitalization with  mod I assistance from spouse using educational materials provided Outcome: Progressing   Problem: Education: Goal: Ability to describe self-care measures that may prevent or decrease complications (Diabetes Survival Skills Education) will improve Outcome: Progressing Goal: Individualized Educational Video(s) Outcome: Progressing   Problem: Coping: Goal: Ability to adjust to condition or change in health will improve Outcome: Progressing   Problem: Fluid Volume: Goal: Ability to maintain a balanced intake and output will improve Outcome: Progressing   Problem: Health Behavior/Discharge Planning: Goal: Ability to identify and utilize available resources and services will improve Outcome: Progressing Goal: Ability to manage health-related needs will improve Outcome: Progressing    Problem: Metabolic: Goal: Ability to maintain appropriate glucose levels will improve Outcome: Progressing   Problem: Nutritional: Goal: Maintenance of adequate nutrition will improve Outcome: Progressing Goal: Progress toward achieving an optimal weight will improve Outcome: Progressing   Problem: Skin Integrity: Goal: Risk for impaired skin integrity will decrease Outcome: Progressing   Problem: Tissue Perfusion: Goal: Adequacy of tissue perfusion will improve Outcome: Progressing

## 2024-03-03 NOTE — Progress Notes (Signed)
 Occupational Therapy Session Note  Patient Details  Name: Diane Watkins MRN: [id number] Date of Birth: 08/01/1950  Today's Date: 03/03/2024 OT Individual Time: 0830-0920+1353-1503 OT Individual Time Calculation (min): 50 min    Short Term Goals: Week 1:  OT Short Term Goal 1 (Week 1): STG=LTG d/t ELOS  Skilled Therapeutic Interventions/Progress Updates:  Session 1: Pt greeted seated EOB, pt agreeable to OT intervention.    No pain reported during session.   Transfers/bed mobility/functional mobility:  Pt completed sit>stand from EOB with RW and CGA. Stand pivot transfer to w/c with RW and CGA.    ADLs:  Grooming: pt engaged in seated grooming tasks at sink with total A for hair washing with pt then able to complete blow drying task from sitting with set- up assist. Emphasis on attempting grooming tasks in standing with pt needing MIN verbal cues to recall compensatory techniques for oral care care I.e bringing cup to mouth to spit vs bending forward. Pt able to stand at sink for oral care for > 5 mins.   UB dressing:pt donned bra and  gown with set- up assist.    Bathing: pt completed quick sponge bath at sink washing off her underarms at sink in standing with supervision.  Transfers: ambulatory toilet transfer with RW and CGA- supervision.  Toileting: continent urine void, pt completed 3/3 toileting tasks with CGA. Min verbal cues to maintain back precautions.                  Ended session with pt seated EOB with all needs within reach and bed alarm activated.                        Session 2:Pt greeted supine in bed, pt agreeable to OT intervention.    No pain reported during session.   Transfers/bed mobility/functional mobility:  Pt completed supine>sit with CGA via log roll technique. Pt completed sit>stands with CGA and functional ambulation = to a household distance with RW and CGA.    IADLS: Pt completed functional ambulation task in kitchen with pt instructed  to ambulate in kitchen to retreive various IADL items out of [place] cabinets and at knee level. Pt does a great job with completing a squat with a straight back to retrieve items below knee level. Pt completed task with overall CGA.   Education:  Discussed energy conservation strategies for IADLs mostly in kitchen but did discuss laundry tasks and using reacher as needed. Full education and demo provided on all LB AE for bathing/dressing. Issued pt RW bag to assist with transporting IADL items. Discussed compensatory methods for other IADLs such as feeding her dog etc in order to maintain her back precautions. Discussed setting up kitchen to maintain back precautions I.e placing frequently used items within her reach. Discussed having her groceries delivered vs going to the store.                  Ended session with pt seated in recliner with all needs within reach and chair alarm activated.                    Therapy Documentation Precautions:  Precautions Precautions: Back, Fall Precaution Booklet Issued: No Recall of Precautions/Restrictions: Intact Precaution/Restrictions Comments: able to verbalize 3/3 precautions Other Brace: no brace needed per orders Restrictions Weight Bearing Restrictions Per Provider Order: No    Therapy/Group: Individual Therapy  Ronal Gift [facility] 03/03/2024, 12:06 PM

## 2024-03-04 DIAGNOSIS — D72829 Elevated white blood cell count, unspecified: Secondary | ICD-10-CM

## 2024-03-04 DIAGNOSIS — Z794 Long term (current) use of insulin: Secondary | ICD-10-CM

## 2024-03-04 NOTE — Progress Notes (Signed)
 Occupational Therapy Weekly Progress Note  Patient Details  Name: Diane Watkins MRN: [id number] Date of Birth: 1951/04/13  Beginning of progress report period: February 25, 2024 End of progress report period: March 04, 2024  Today's Date: 03/04/2024 OT Individual Time: 9154-9074 OT Individual Time Calculation (min): 40 min  and Today's Date: 03/04/2024 OT Missed Time: 20 Minutes Missed Time Reason: Patient ill (comment) (N/V and headache)   Patient did not have STGs 2/2 original ELOS, pt staying longer than originally anticipated with MOD I goals. Pt has been progressing with OT goals performing household distance functional mob with CGA/Supervision and RW, ADLs with assist with some LB tasks such as socks/shoes and threading pants (pt has been wearing gowns mostly). Pt has been limited by headaches and pain at times.   Patient continues to demonstrate the following deficits: muscle weakness, decreased cardiorespiratoy endurance, decreased coordination and decreased motor planning, decreased awareness, and decreased standing balance, decreased postural control, and decreased balance strategies and therefore will continue to benefit from skilled OT intervention to enhance overall performance with BADL, iADL, and Reduce care partner burden.  Patient progressing toward long term goals..  Continue plan of care.  OT Short Term Goals Week 1:  OT Short Term Goal 1 (Week 1): STG=LTG d/t ELOS OT Short Term Goal 1 - Progress (Week 1): Progressing toward goal Week 2:  OT Short Term Goal 1 (Week 2): STG = LTG 2/2 ELOS at MOD I  Skilled Therapeutic Interventions/Progress Updates:    Pt sitting up at EOB at time of session, looking visably sick and verbalized not feeling well - stating headache was awful, she tried to wean off pain meds last night, did not provide number rating but nursing present at this time too and aware. Pt nauseous this AM as well - breakfast untouched. Encouraged PO intake,  especially liquids. Provided relaxation techniques to reduce tension in neck - also reviewed DC planning and pt progress. OT updating progress note this date - pt needing some assist for LB ADL and MIN cues for back precautions. Note provided the pt several minutes and returned later, pt actively vomiting and nursing present to assist. missed 20 mins 2/2 nausea/vomiting. Pt reporting unable to do anything at this time.   Therapy Documentation Precautions:  Precautions Precautions: Back, Fall Precaution Booklet Issued: No Recall of Precautions/Restrictions: Intact Precaution/Restrictions Comments: able to verbalize 3/3 precautions Other Brace: no brace needed per orders Restrictions Weight Bearing Restrictions Per Provider Order: No     Therapy/Group: Individual Therapy  Chiquita JAYSON Hopping 03/04/2024, 9:07 AM

## 2024-03-04 NOTE — Progress Notes (Addendum)
 Physical Therapy Session Note  Patient Details  Name: Diane Watkins MRN: [id number] Date of Birth: [birth year]  Today's Date: 03/04/2024  PT Individual Time: 1015-1115 PT Individual Time Calculation (min): 60 min   PT Individual Time: 8584-8481 PT Individual Time Calculation (min): 63 min  Missed 12 minutes  Short Term Goals: Week 1:  PT Short Term Goal 1 (Week 1): =LTG  Skilled Therapeutic Interventions/Progress Updates:    Pt asleep in bed, easily awaken and agreeable for session.  Pt reports she was sick this morning, wasn't able to eat her breakfast.  Pt transfers supine->sit mod indep with bed railing demonstrating good log roll techniqe.  Sitting EOB, pt able to dress self with set up, standing to zip house rob without support.  Gait with RW Supervision to day gym.  NuStep x 20', UE/LE, pt stopping intermittently due to lethargy. Pt amb back to room, cueing to increase step length, heel first contact. Once in room, pt requesting to use restroom for BM.  Pt requires CGA, Min A for pericare assist. Pt transferred into bed mod indep with all items within reach.    Second session: Pt sitting EOB feeling better than this morning and agreeable for session. Pt denies pain.  Pt amb from room to main gym with RW Supervision and without RW from main gym to day gym with CGA/S, cueing for LLE stride increase.   NMR: Pt performed standing horse shoe activity without device on compliant surface and on non compliant surface; super size connect four: walking from mat to game each turn. Challenging balance: created obstacle for pt to walk over, stand on non compliant surface and returning to mat to sit for each turn.  Basketball using BUE, knee bending to shoot ball x10.   Pt demonstrates good ankle/hip strategies, able to self correct to midline, no LOB.  Extended rest break.  Amb back to main gym with RW, rest, then back to room with RW; Supervision, CGA.  In room, Nsg entered to attach IV line.  Pt  started to c/o head hurting and request to lie in bed.  Husband in room at end of session.      Therapy Documentation Precautions:  Precautions Precautions: Back, Fall Precaution Booklet Issued: No Recall of Precautions/Restrictions: Intact Precaution/Restrictions Comments: able to verbalize 3/3 precautions Other Brace: no brace needed per orders Restrictions Weight Bearing Restrictions Per Provider Order: No    Pain: Pain Assessment Pain Scale: 0-10 Pain Score: 0 Pain Location: Hip Pain Intervention(s): Medication (See eMAR)     Therapy/Group: Individual Therapy  Arland GORMAN Fast 03/04/2024, 3:35 PM

## 2024-03-04 NOTE — Progress Notes (Signed)
 PROGRESS NOTE   Subjective/Complaints:  Patient had some headache and nausea this morning.  Got a dose of Compazine .  Review of therapy notes indicates she felt better in the afternoon and was able to participate with therapy.  Breakfast bad- also wants Boost from home.   ROS: Per HPI   Pt denies SOB, abd pain, CP, N/V/C/D, and vision changes   + Constipation --improved + headache mostly at night--symptomatic this morning + Nausea and vomiting -intermittent + Nocturia + Spasms  Objective:   No results found.   Recent Labs    03/03/24 0420  WBC 10.5  HGB 8.6*  HCT 26.7*  PLT 478*   Recent Labs    03/03/24 0420  NA 138  K 4.3  CL 101  CO2 25  GLUCOSE 126*  BUN 16  CREATININE 0.85  CALCIUM  9.0    Intake/Output Summary (Last 24 hours) at 03/04/2024 1656 Last data filed at 03/04/2024 1345 Gross per 24 hour  Intake 436 ml  Output --  Net 436 ml        Physical Exam: Vital Signs Blood pressure 126/70, pulse 90, temperature 97.8 F (36.6 C), temperature source Oral, resp. rate 18, height 5' 2 (1.575 m), weight 93 kg, SpO2 99%.      General: awake, alert, appropriate, lying in bed, NAD, appears uncomfortable HENT: conjugate gaze; oropharynx moist CV: regular rate and rhythm; no JVD Pulmonary: CTA B/L; no labored breathing GI: soft, NT, ND, (+)BS Psychiatric: appropriate Neurological: Ox3  Skin: incision has a little localized erythema, secondary to sutures- no drainage mild swelling Extremities: No clubbing, cyanosis; 1+ bilateral nonpitting edema Psych: Flat affect, but appropriate. Skin: Surgical incision with nonadherent dressing,  covered in clean and dry ABD pad  Neuro:  Awake, alert, oriented x 3. No apparent cognitive deficits. 5 out of 5 bilateral upper and lower extremities.  Sensation intact in bilateral legs to light touch    Assessment/Plan: 1. Functional deficits which  require 3+ hours per day of interdisciplinary therapy in a comprehensive inpatient rehab setting. Physiatrist is providing close team supervision and 24 hour management of active medical problems listed below. Physiatrist and rehab team continue to assess barriers to discharge/monitor patient progress toward functional and medical goals  Care Tool:  Bathing    Body parts bathed by patient: Left upper leg, Right arm, Left arm, Chest, Abdomen, Front perineal area, Buttocks, Right upper leg, Face   Body parts bathed by helper: Right lower leg, Left lower leg, Buttocks     Bathing assist Assist Level: Moderate Assistance - Patient 50 - 74%     Upper Body Dressing/Undressing Upper body dressing   What is the patient wearing?: Pull over shirt, Bra    Upper body assist Assist Level: Set up assist    Lower Body Dressing/Undressing Lower body dressing      What is the patient wearing?: Underwear/pull up     Lower body assist Assist for lower body dressing: Contact Guard/Touching assist     Toileting Toileting    Toileting assist Assist for toileting: Contact Guard/Touching assist     Transfers Chair/bed transfer  Transfers assist     Chair/bed transfer  assist level: Independent with assistive device Chair/bed transfer assistive device: Geologist, engineering   Ambulation assist      Assist level: Contact Guard/Touching assist Assistive device: No Device Max distance: 225'   Walk 10 feet activity   Assist     Assist level: Contact Guard/Touching assist Assistive device: No Device   Walk 50 feet activity   Assist    Assist level: Contact Guard/Touching assist Assistive device: No Device    Walk 150 feet activity   Assist    Assist level: Contact Guard/Touching assist Assistive device: No Device    Walk 10 feet on uneven surface  activity   Assist Walk 10 feet on uneven surfaces activity did not occur: Safety/medical concerns          Wheelchair     Assist Is the patient using a wheelchair?: Yes Type of Wheelchair: Manual    Wheelchair assist level: Total Assistance - Patient < 25%      Wheelchair 50 feet with 2 turns activity    Assist        Assist Level: Total Assistance - Patient < 25%   Wheelchair 150 feet activity     Assist      Assist Level: Total Assistance - Patient < 25%   Blood pressure 126/70, pulse 90, temperature 97.8 F (36.6 C), temperature source Oral, resp. rate 18, height 5' 2 (1.575 m), weight 93 kg, SpO2 99%.  Medical Problem List and Plan: 1. 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  - In the afternoon patient was noted to have some increased serosanguineous drainage from inferior portion of incision.  Surgeon was notified to review, continue to monitor  9-13: Drainage appears stable, mild.  Continue current management.--Stable 9-14  9/15 seen by ortho today- continue current regimen    D/c set for 9/23 Con't CIR PT and OT 2.  Antithrombotics: -DVT/anticoagulation:  Pharmaceutical: Lovenox              -antiplatelet therapy: N/A   3. Pain Management: Fioricet  and hydrocodone  prn for pain. Kpad ordered             --gabapentin  added at nights for hip pain             --Voltaren  gel added to right hip for local measures.   - 9/11 currently has tramadol  and hydrocodone  as needed for pain, metoprolol  started in may also benefit for headaches  -9/11 headaches improved continue current regimen   - 9/13: Headache overnight increased, no PRNs until 6 AM this morning, occurs mostly at nighttime and with pressure on her head.  Start Topamax  25 mg nightly given she was not  asking for PRNs despite headache.  9/15 HA doing better, continue current   9/16 - HA's again yesterday afternoon and overnight- not this AM- last pain meds just before midnight.   9/17- hasn't needed Morphine  in 24+ hours- I suggested taking tramadol  prn before therapy for R hip pain. 9/18- pt doing well with Tramadol - says doesn't want to take MSIR again-  felt so out of it- said next time, won't take again- d/c'd meds   9/19 patient had headache this morning but improved in the afternoon.  Continue current regimen of tramadol  4. Mood/Behavior/Sleep: LCSW to follow for evaluation and support.              --Monitor sleep hygiene. Has had issues with insomnia. Will continue Xanax  prn for anxiety             -antipsychotic agents: N/A  5. Neuropsych/cognition: patient is able to make decision on her own behalf, medically.   6. Skin/Wound Care:  Routine pressure relief measures.   - 9-14: Lower part of surgical site with decreasing amount of drainage through the weekend, serous, stable   7. Fluids/Electrolytes/Nutrition: Monitor I/O. Encourage fluid intake.   8. 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 Vitamin C supplement started  - 9-14: Some subclinical fevers yesterday, on chart review has been getting these intermittently.  Having some urinary urgency and lower abdominal pain, so we will get urinalysis today, however should be decently covered with current antibiotics.  UA does not indicate infection   9/16- CRP 1.8 and ESR 67- first time checked- ordered qweek 9. T2DM: Hgb A1C-7.0. d/c CBGs checks and ISS since well controlled  CBG (last 3)  No results for input(s): GLUCAP in the last 72 hours.     10. Leucocytosis improving from rise to 17.7-->11.4.  - WBC a little higher at 12.7 continue to monitor  9-13: WBC stable, 11-12.    9-14: Some subclinical fevers, 99.4, UA as above and CBC in a.m.  9/15  WBC 12.7, continue to monitor for signs of infection, Tmax 99.7   9/16- Tm 99.3- will recheck Labs Thursday unless things worse clinically.   9/17- Tm 99.0-   0/19 afebrile, WBC down to 10.5 11. 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    12. Anemia of chronic illness: Recheck CBC in am. Hgb trending down from 10.7-->9.8.             --monitor for signs of bleeding.   - Hemoglobin overall stable continue to follow  9-13: Hemoglobin down slightly to 8.8.   - iron WNL, TIBC low, consistent with anemia of chronic disease.   - FOBT negative  9/15 stable hgb at 8.9   9/18- Hb 8.6- slightly dipped- will monitor 2x/week 13.  Abnormal LFTs: Recheck in am.   -9/11 improved today to AST 57, ALT 60   9/17- will change to check qM/H  9/18- AST 49 and ALT 83- AST down from 57 and ALT up from 60- will recheck next Monday- if still up, will call Pharmacy- don't see any meds causing this myself- and off tylenol  14. Rectosigmoid colitis?: Monitor intake. On Senna S. GIP negative.   - 9/12 will try to avoid stimulant laxatives  9-13: See #18    15. 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    16. Tachycardia: magnesium  supplement started  - 9/11 mild intermittent tachycardia, may be pain related.  See #17  -9/12 intermittent mild tachycardia, continue current regimen and monitor for now   17. HTN: magnesium  supplement started  -9/11 metoprolol  12.5 mg twice daily  - 9/12 BP fair control overall, continue current regimen and monitor  -  Ongoing fair control, monitor  -9/15 stable continue current regimen   9/17- 9/18BP was 150 systolic but overall, doing better  9/19 well-controlled continue current regimen    03/04/2024    5:59 AM 03/03/2024    7:41 PM 03/03/2024    1:20 PM  Vitals with BMI  Systolic 126 122 863  Diastolic 70 63 69  Pulse 90 97 87   18.   Constipation.  - 9/11 MiraLAX  34 g daily  -9/12 continue MiraLAX , will add lactulose .  Avoid stimulant laxatives when possible.  Will check abdomen x-ray to assess for stool burden  - 9-13: KUB without significant stool burden.  Ordered lactulose  for daily, docusate 200 mg BID.  Positive bowel movement this a.m.  After lactulose .  9/15 LBM today, improved   9/16- LBM yesterday- feeling good  9/17- LBM yesterday  9/18- LBM 5-6x yesterday- will decrease Colace from 200 mg BID to 100 mg daily. Told pt to hold today and restart lower dose tomorrow.   9/19 LBM today 19. Insomnia  9/18- will try Trazodone  25-50 mg at bedtime prn- discouraged pt to take Xananx for sleep esp since didn't take at home.    LOS: 9 days A FACE TO FACE EVALUATION WAS PERFORMED  Murray Collier 03/04/2024, 4:56 PM

## 2024-03-05 DIAGNOSIS — E119 Type 2 diabetes mellitus without complications: Secondary | ICD-10-CM

## 2024-03-05 NOTE — Progress Notes (Signed)
 PROGRESS NOTE   Subjective/Complaints:  Working with therapy in her room.  Had some nausea/vomiting this morning but has resolved.  Not having a headache this morning.  Therapy reports she is making good progress.  ROS: Per HPI   Pt denies SOB, abd pain, CP, C/D, and vision changes   + Constipation --improved, LBM yesterday + headache mostly at night--much improved today + Nausea and vomiting -intermittent + Nocturia + Spasms  Objective:   No results found.   Recent Labs    03/03/24 0420  WBC 10.5  HGB 8.6*  HCT 26.7*  PLT 478*   Recent Labs    03/03/24 0420  NA 138  K 4.3  CL 101  CO2 25  GLUCOSE 126*  BUN 16  CREATININE 0.85  CALCIUM  9.0    Intake/Output Summary (Last 24 hours) at 03/05/2024 0938 Last data filed at 03/05/2024 0738 Gross per 24 hour  Intake 298 ml  Output --  Net 298 ml        Physical Exam: Vital Signs Blood pressure 112/76, pulse 79, temperature 97.7 F (36.5 C), resp. rate 18, height 5' 2 (1.575 m), weight 93 kg, SpO2 95%.      General: awake, alert, appropriate, working with therapy in her room HENT: conjugate gaze; oropharynx moist CV: regular rate and rhythm; no JVD Pulmonary: CTA B/L; no labored breathing GI: soft, NT, ND, (+)BS Psychiatric: appropriate Neurological: Ox3  Skin: incision has a little localized erythema, secondary to sutures- no drainage mild swelling Extremities: No clubbing, cyanosis; 1+ bilateral nonpitting edema Psych: Flat affect, but appropriate. Skin: Surgical incision with nonadherent dressing,  covered in clean and dry ABD pad  Neuro:  Awake, alert, oriented x 3. No apparent cognitive deficits. 5 out of 5 bilateral upper and lower extremities.  Sensation intact in bilateral legs to light touch  Fair Balance standing with walker with therapy   Assessment/Plan: 1. Functional deficits which require 3+ hours per day of  interdisciplinary therapy in a comprehensive inpatient rehab setting. Physiatrist is providing close team supervision and 24 hour management of active medical problems listed below. Physiatrist and rehab team continue to assess barriers to discharge/monitor patient progress toward functional and medical goals  Care Tool:  Bathing    Body parts bathed by patient: Left upper leg, Right arm, Left arm, Chest, Abdomen, Front perineal area, Buttocks, Right upper leg, Face   Body parts bathed by helper: Right lower leg, Left lower leg, Buttocks     Bathing assist Assist Level: Moderate Assistance - Patient 50 - 74%     Upper Body Dressing/Undressing Upper body dressing   What is the patient wearing?: Pull over shirt, Bra    Upper body assist Assist Level: Set up assist    Lower Body Dressing/Undressing Lower body dressing      What is the patient wearing?: Underwear/pull up     Lower body assist Assist for lower body dressing: Contact Guard/Touching assist     Toileting Toileting    Toileting assist Assist for toileting: Contact Guard/Touching assist     Transfers Chair/bed transfer  Transfers assist     Chair/bed transfer assist level: Independent with assistive device  Chair/bed transfer assistive device: Arboriculturist assist      Assist level: Contact Guard/Touching assist Assistive device: No Device Max distance: 225'   Walk 10 feet activity   Assist     Assist level: Contact Guard/Touching assist Assistive device: No Device   Walk 50 feet activity   Assist    Assist level: Contact Guard/Touching assist Assistive device: No Device    Walk 150 feet activity   Assist    Assist level: Contact Guard/Touching assist Assistive device: No Device    Walk 10 feet on uneven surface  activity   Assist Walk 10 feet on uneven surfaces activity did not occur: Safety/medical concerns          Wheelchair     Assist Is the patient using a wheelchair?: Yes Type of Wheelchair: Manual    Wheelchair assist level: Total Assistance - Patient < 25%      Wheelchair 50 feet with 2 turns activity    Assist        Assist Level: Total Assistance - Patient < 25%   Wheelchair 150 feet activity     Assist      Assist Level: Total Assistance - Patient < 25%   Blood pressure 112/76, pulse 79, temperature 97.7 F (36.5 C), resp. rate 18, height 5' 2 (1.575 m), weight 93 kg, SpO2 95%.  Medical Problem List and Plan: 1. 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  - In the afternoon patient was noted to have some increased serosanguineous drainage from inferior portion of incision.  Surgeon was notified to review, continue to monitor  9-13: Drainage appears stable, mild.  Continue current management.--Stable 9-14  9/15 seen by ortho today- continue current regimen    D/c set for 9/23 Con't CIR PT and OT 2.  Antithrombotics: -DVT/anticoagulation:  Pharmaceutical: Lovenox              -antiplatelet therapy: N/A   3. Pain Management: Fioricet  and hydrocodone  prn for pain. Kpad ordered             --gabapentin  added at nights for hip pain             --Voltaren  gel added to right hip for local measures.   - 9/11 currently has tramadol  and hydrocodone  as needed for pain, metoprolol  started in may also benefit for headaches  -9/11 headaches improved continue current regimen   - 9/13: Headache overnight increased, no PRNs until 6 AM this morning, occurs mostly at nighttime and with pressure on her head.  Start Topamax  25 mg nightly given she was not asking for PRNs despite  headache.  9/15 HA doing better, continue current   9/16 - HA's again yesterday afternoon and overnight- not this AM- last pain meds just before midnight.   9/17- hasn't needed Morphine  in 24+ hours- I suggested taking tramadol  prn before therapy for R hip pain. 9/18- pt doing well with Tramadol - says doesn't want to take MSIR again- felt so out of it- said next time, won't  take again- d/c'd meds   9/19 patient had headache this morning but improved in the afternoon.    9/20 headache much improved today, continue current regimen.  Has not required recent tramadol  4. Mood/Behavior/Sleep: LCSW to follow for evaluation and support.              --Monitor sleep hygiene. Has had issues with insomnia. Will continue Xanax  prn for anxiety             -antipsychotic agents: N/A  5. Neuropsych/cognition: patient is able to make decision on her own behalf, medically.   6. Skin/Wound Care:  Routine pressure relief measures.   - 9-14: Lower part of surgical site with decreasing amount of drainage through the weekend, serous, stable   7. Fluids/Electrolytes/Nutrition: Monitor I/O. Encourage fluid intake.   8. 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 Vitamin C supplement started  - 9-14: Some subclinical fevers yesterday, on chart review has been getting these intermittently.  Having some urinary urgency and lower abdominal pain, so we will get urinalysis today, however should be decently covered with current antibiotics.  UA does not indicate infection   9/16- CRP 1.8 and ESR 67- first time checked- ordered qweek 9. T2DM: Hgb A1C-7.0. d/c CBGs checks and ISS since well controlled  CBG (last 3)  No results for input(s): GLUCAP in the last 72 hours. Glucose 126 overall stable on last BMP    10. Leucocytosis improving from rise to 17.7-->11.4.  - WBC a little higher at 12.7 continue to monitor  9-13: WBC stable, 11-12.     9-14: Some subclinical fevers, 99.4, UA as above and CBC in a.m.  9/15 WBC 12.7, continue to monitor for signs of infection, Tmax 99.7   9/16- Tm 99.3- will recheck Labs Thursday unless things worse clinically.   9/17- Tm 99.0-   9/19 afebrile, WBC down to 10.5 11. 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    12. Anemia of chronic illness: Recheck CBC in am. Hgb trending down from 10.7-->9.8.             --monitor for signs of bleeding.   - Hemoglobin overall stable continue to follow  9-13: Hemoglobin down slightly to 8.8.   - iron WNL, TIBC low, consistent with anemia of chronic disease.   - FOBT negative  9/15 stable hgb at 8.9   9/18- Hb 8.6- slightly dipped- will monitor 2x/week 13.  Abnormal LFTs: Recheck in am.   -9/11 improved today to AST 57, ALT 60   9/17- will change to check qM/H  9/18- AST 49 and ALT 83- AST down from 57 and ALT up from 60- will recheck next Monday- if still up, will call Pharmacy- don't see any meds causing this myself- and off tylenol  14. Rectosigmoid colitis?: Monitor intake. On Senna S. GIP negative.   - 9/12 will try to avoid stimulant laxatives  9-13: See #18    15. 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    16. Tachycardia: magnesium  supplement started  - 9/11 mild intermittent tachycardia, may be pain related.  See #17  -9/12 intermittent mild tachycardia, continue current regimen and monitor for now -9/20 heart rate stable continue to monitor  17. HTN: magnesium  supplement started  -9/11 metoprolol  12.5 mg twice  daily  - 9/12 BP fair control overall, continue current regimen and monitor  - Ongoing fair control, monitor  -9/15 stable continue current regimen   9/17- 9/18BP was 150 systolic but overall, doing better  9/19-20 well-controlled continue current regimen    03/05/2024    3:41 AM 03/04/2024     7:58 PM 03/04/2024    5:59 AM  Vitals with BMI  Systolic 112 128 873  Diastolic 76 65 70  Pulse 79 97 90   18.  Constipation.  - 9/11 MiraLAX  34 g daily  -9/12 continue MiraLAX , will add lactulose .  Avoid stimulant laxatives when possible.  Will check abdomen x-ray to assess for stool burden  - 9-13: KUB without significant stool burden.  Ordered lactulose  for daily, docusate 200 mg BID.  Positive bowel movement this a.m.  After lactulose .  9/15 LBM today, improved   9/16- LBM yesterday- feeling good  9/17- LBM yesterday  9/18- LBM 5-6x yesterday- will decrease Colace from 200 mg BID to 100 mg daily. Told pt to hold today and restart lower dose tomorrow.   9/20 LBM yesterday 19. Insomnia  9/18- will try Trazodone  25-50 mg at bedtime prn- discouraged pt to take Xananx for sleep esp since didn't take at home.    LOS: 10 days A FACE TO FACE EVALUATION WAS PERFORMED  Murray Collier 03/05/2024, 9:38 AM

## 2024-03-05 NOTE — Progress Notes (Signed)
 Occupational Therapy Session Note  Patient Details  Name: Diane Watkins MRN: [id number] Date of Birth: 1951-01-17  Today's Date: 03/05/2024 OT Individual Time: 9254-9169 OT Individual Time Calculation (min): 45 min    Short Term Goals: Week 1:  OT Short Term Goal 1 (Week 1): STG=LTG d/t ELOS OT Short Term Goal 1 - Progress (Week 1): Progressing toward goal  Skilled Therapeutic Interventions/Progress Updates:   Pt greeted sitting in recliner and agreeable to OT treatment session focused on self-care retraining. Pt ambulated to the sink w/ RW and supervision. OT assist to wash hair using hair washing tray and discussed with pt where she could purchase one for home use if she is not cleared to shower at dc. UB bathing/dressing coompleted w/ set-up A. LB dressing with supervision and good recall of back precautions using figure 4 position to thread underwear. Addressed standing balance/endurance standing to blow dry hair and brush teeth. PT able to remove UB support to blow dry hair and comb at the same time with supervision. Pt tolerated standing for 11 minutes. PT then ambulated to recliner with RW and supervision. Pt left seated in recliner with call bell in reach, and needs met.   Therapy Documentation Precautions:  Precautions Precautions: Back, Fall Precaution Booklet Issued: No Recall of Precautions/Restrictions: Intact Precaution/Restrictions Comments: able to verbalize 3/3 precautions Other Brace: no brace needed per orders Restrictions Weight Bearing Restrictions Per Provider Order: No Pain:  Denies pain   Therapy/Group: Individual Therapy  Sharyle GORMAN Pert 03/05/2024, 8:12 AM

## 2024-03-05 NOTE — Progress Notes (Signed)
 Physical Therapy Session Note  Patient Details  Name: Diane Watkins MRN: [id number] Date of Birth: 11-17-1950  Today's Date: 03/05/2024 PT Individual Time: 0903-1000 PT Individual Time Calculation (min): 57 min   Short Term Goals: Week 1:  PT Short Term Goal 1 (Week 1): =LTG  Skilled Therapeutic Interventions/Progress Updates:    Pt up in recliner when PT arrived, pt agreeable.  Pt states she feels sleepy (again), had Benadryl  around 3 am, denies pain.  Pt amb with RW to main gym with supervision, short step length, slow cadence.  LE ex: 1.5# ankle wts seated LAQ, marching, steps out x15 each, standing with RW heel raises 2x10; hip extension x10, marching x10, abduction x10. Rest provided throughout session.  Stair negotiation with ankle wts x1HR x2 trials 3 step Supervision.  Gait without device, x1 HHA to day gym cueing to increased LLE stride length, heel strike. Pt able to correct, requires cueing throughout.  Pt amb ~x115.  Pt amb ~x145' back to room to recliner with LE elevated, all items within reach.    Therapy Documentation Precautions:  Precautions Precautions: Back, Fall Precaution Booklet Issued: No Recall of Precautions/Restrictions: Intact Precaution/Restrictions Comments: able to verbalize 3/3 precautions Other Brace: no brace needed per orders Restrictions Weight Bearing Restrictions Per Provider Order: No     Pain: 0/10       Therapy/Group: Individual Therapy  Arland GORMAN Fast 03/05/2024, 9:07 AM

## 2024-03-06 MED ORDER — LACTULOSE 10 GM/15ML PO SOLN: 10.0000 g | Freq: Every day | ORAL | Status: DC

## 2024-03-06 NOTE — Progress Notes (Signed)
 PROGRESS NOTE   Subjective/Complaints:  Working with therapy in the gym.  Nausea is improved today, headaches are improved also. Reports large BM today  ROS: Per HPI   Pt denies SOB, abd pain, CP, C/D, and vision changes   + Constipation --improved + headache mostly at night--improved again today + Nausea and vomiting -improved today + Nocturia + Spasms  Objective:   No results found.   No results for input(s): WBC, HGB, HCT, PLT in the last 72 hours.  No results for input(s): NA, K, CL, CO2, GLUCOSE, BUN, CREATININE, CALCIUM  in the last 72 hours.   Intake/Output Summary (Last 24 hours) at 03/06/2024 0956 Last data filed at 03/06/2024 0914 Gross per 24 hour  Intake 476 ml  Output --  Net 476 ml        Physical Exam: Vital Signs Blood pressure 136/65, pulse 82, temperature 99 F (37.2 C), temperature source Oral, resp. rate 17, height 5' 2 (1.575 m), weight 93 kg, SpO2 94%.      General: awake, alert, appropriate, working with therapy in the gym, appears comfortable HENT: conjugate gaze; oropharynx moist CV: regular rate and rhythm; no JVD Pulmonary: CTA B/L; no labored breathing GI: soft, NT, ND, (+)BS Psychiatric: appropriate, pleasant Neurological: Ox3  Skin: incision has a little localized erythema, secondary to sutures- no drainage mild swelling-not visualized today Extremities: No clubbing, cyanosis; 1+ bilateral nonpitting edema Psych: Flat affect, but appropriate. Skin: Surgical incision with nonadherent dressing,  covered in clean and dry ABD pad  Neuro:  Awake, alert, oriented x 3. No apparent cognitive deficits. 5 out of 5 bilateral upper and lower extremities.  Sensation intact in bilateral legs to light touch    Assessment/Plan: 1. Functional deficits which require 3+ hours per day of interdisciplinary therapy in a comprehensive inpatient rehab  setting. Physiatrist is providing close team supervision and 24 hour management of active medical problems listed below. Physiatrist and rehab team continue to assess barriers to discharge/monitor patient progress toward functional and medical goals  Care Tool:  Bathing    Body parts bathed by patient: Left upper leg, Right arm, Left arm, Chest, Abdomen, Front perineal area, Buttocks, Right upper leg, Face   Body parts bathed by helper: Right lower leg, Left lower leg, Buttocks     Bathing assist Assist Level: Moderate Assistance - Patient 50 - 74%     Upper Body Dressing/Undressing Upper body dressing   What is the patient wearing?: Pull over shirt, Bra    Upper body assist Assist Level: Set up assist    Lower Body Dressing/Undressing Lower body dressing      What is the patient wearing?: Underwear/pull up     Lower body assist Assist for lower body dressing: Contact Guard/Touching assist     Toileting Toileting    Toileting assist Assist for toileting: Contact Guard/Touching assist     Transfers Chair/bed transfer  Transfers assist     Chair/bed transfer assist level: (P) Independent with assistive device Chair/bed transfer assistive device: (P) Walker   Locomotion Ambulation   Ambulation assist      Assist level: (P) Independent with assistive device Assistive device: (P) Walker-rolling Max distance: (  P) 150   Walk 10 feet activity   Assist     Assist level: (P) Independent with assistive device Assistive device: (P) Walker-rolling   Walk 50 feet activity   Assist    Assist level: (P) Independent with assistive device Assistive device: (P) Walker-rolling    Walk 150 feet activity   Assist    Assist level: (P) Independent with assistive device Assistive device: (P) Walker-rolling    Walk 10 feet on uneven surface  activity   Assist Walk 10 feet on uneven surfaces activity did not occur: Safety/medical concerns          Wheelchair     Assist Is the patient using a wheelchair?: Yes Type of Wheelchair: Manual    Wheelchair assist level: Total Assistance - Patient < 25%      Wheelchair 50 feet with 2 turns activity    Assist        Assist Level: Total Assistance - Patient < 25%   Wheelchair 150 feet activity     Assist      Assist Level: Total Assistance - Patient < 25%   Blood pressure 136/65, pulse 82, temperature 99 F (37.2 C), temperature source Oral, resp. rate 17, height 5' 2 (1.575 m), weight 93 kg, SpO2 94%.  Medical Problem List and Plan: 1. 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  - In the afternoon patient was noted to have some increased serosanguineous drainage from inferior portion of incision.  Surgeon was notified to review, continue to monitor  9-13: Drainage appears stable, mild.  Continue current management.--Stable 9-14  9/15 seen by ortho today- continue current regimen    D/c set for 9/23 Con't CIR PT and OT 2.  Antithrombotics: -DVT/anticoagulation:  Pharmaceutical: Lovenox              -antiplatelet therapy: N/A   3. Pain Management: Fioricet  and hydrocodone  prn for pain. Kpad ordered             --gabapentin  added at nights for hip pain             --Voltaren  gel added to right hip for local measures.   - 9/11 currently has tramadol  and hydrocodone  as needed for pain, metoprolol  started in may also benefit for headaches  -9/11 headaches improved continue current regimen   - 9/13: Headache overnight increased, no PRNs until 6 AM this morning, occurs mostly at nighttime and with pressure on her head.  Start Topamax  25 mg nightly given she was not asking  for PRNs despite headache.  9/15 HA doing better, continue current   9/16 - HA's again yesterday afternoon and overnight- not this AM- last pain meds just before midnight.   9/17- hasn't needed Morphine  in 24+ hours- I suggested taking tramadol  prn before therapy for R hip pain. 9/18- pt doing well with Tramadol - says doesn't want to take MSIR again- felt so out of it- said next time, won't take again- d/c'd meds   9/19 patient had headache this morning but improved in the afternoon.  9/20 headache much improved today, continue current regimen.  Has not required recent tramadol   9/21 headache remains proved, has not used tramadol  in a couple days.  Continue current regimen and monitor 4. Mood/Behavior/Sleep: LCSW to follow for evaluation and support.              --Monitor sleep hygiene. Has had issues with insomnia. Will continue Xanax  prn for anxiety             -antipsychotic agents: N/A  5. Neuropsych/cognition: patient is able to make decision on her own behalf, medically.   6. Skin/Wound Care:  Routine pressure relief measures.   - 9-14: Lower part of surgical site with decreasing amount of drainage through the weekend, serous, stable   7. Fluids/Electrolytes/Nutrition: Monitor I/O. Encourage fluid intake.   8. 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 Vitamin C supplement started  - 9-14: Some subclinical fevers yesterday, on chart review has been getting these intermittently.  Having some urinary urgency and lower abdominal pain, so we will get urinalysis today, however should be decently covered with current antibiotics.  UA does not indicate infection   9/16- CRP 1.8 and ESR 67- first time checked- ordered qweek 9. T2DM: Hgb A1C-7.0. d/c CBGs checks and ISS since well controlled  CBG (last 3)  No results for input(s): GLUCAP in the last 72 hours. Glucose 126 overall stable on last BMP    10.  Leucocytosis improving from rise to 17.7-->11.4.  - WBC a little higher at 12.7 continue to monitor  9-13: WBC stable, 11-12.    9-14: Some subclinical fevers, 99.4, UA as above and CBC in a.m.  9/15 WBC 12.7, continue to monitor for signs of infection, Tmax 99.7   9/16- Tm 99.3- will recheck Labs Thursday unless things worse clinically.   9/17- Tm 99.0-   9/19 afebrile, WBC down to 10.5 11. 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    12. Anemia of chronic illness: Recheck CBC in am. Hgb trending down from 10.7-->9.8.             --monitor for signs of bleeding.   - Hemoglobin overall stable continue to follow  9-13: Hemoglobin down slightly to 8.8.   - iron WNL, TIBC low, consistent with anemia of chronic disease.   - FOBT negative  9/15 stable hgb at 8.9   9/18- Hb 8.6- slightly dipped- will monitor 2x/week  Recheck labs tomorrow 13.  Abnormal LFTs: Recheck in am.   -9/11 improved today to AST 57, ALT 60   9/17- will change to check qM/H  9/18- AST 49 and ALT 83- AST down from 57 and ALT up from 60- will recheck next Monday- if still up, will call Pharmacy- don't see any meds causing this myself- and off tylenol  14. Rectosigmoid colitis?: Monitor intake. On Senna S. GIP negative.   - 9/12 will try to avoid stimulant laxatives  9-13: See #18    15. 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    16. Tachycardia: magnesium  supplement started  - 9/11 mild intermittent tachycardia, may be pain related.  See #17  -9/12 intermittent mild tachycardia, continue current regimen and monitor for now -9/20 heart rate stable continue to monitor  17. HTN: magnesium  supplement started  -9/11 metoprolol   12.5 mg twice daily  - 9/12 BP fair control overall, continue current regimen and monitor  - Ongoing fair control, monitor  -9/15 stable continue current  regimen   9/17- 9/18BP was 150 systolic but overall, doing better  9/19-21 well-controlled continue current regimen    03/06/2024    3:48 AM 03/05/2024    7:49 PM 03/05/2024    2:28 PM  Vitals with BMI  Systolic 136 123 887  Diastolic 65 70 58  Pulse 82 99 81   18.  Constipation.  - 9/11 MiraLAX  34 g daily  -9/12 continue MiraLAX , will add lactulose .  Avoid stimulant laxatives when possible.  Will check abdomen x-ray to assess for stool burden  - 9-13: KUB without significant stool burden.  Ordered lactulose  for daily, docusate 200 mg BID.  Positive bowel movement this a.m.  After lactulose .  9/15 LBM today, improved   9/16- LBM yesterday- feeling good  9/17- LBM yesterday  9/18- LBM 5-6x yesterday- will decrease Colace from 200 mg BID to 100 mg daily. Told pt to hold today and restart lower dose tomorrow.   9/21 large bowel movement today appears to be having regular bowel movements, will DC lactulose  for now.  Can restart if needed 19. Insomnia  9/18- will try Trazodone  25-50 mg at bedtime prn- discouraged pt to take Xananx for sleep esp since didn't take at home.    LOS: 11 days A FACE TO FACE EVALUATION WAS PERFORMED  Murray Collier 03/06/2024, 9:56 AM

## 2024-03-06 NOTE — Progress Notes (Signed)
 Physical Therapy Session Note  Patient Details  Name: Diane Watkins MRN: [id number] Date of Birth: 1950/09/18  Today's Date: 03/06/2024 PT Individual Time: 0917-1015 PT Individual Time Calculation (min): 58 min   Short Term Goals: Week 1:  PT Short Term Goal 1 (Week 1): =LTG  Skilled Therapeutic Interventions/Progress Updates:     Pt seated on toilet upon arrival. Pt agreeable to therapy. Pt denies any pain. Pt endorsing nausea towards end of session. Notified nurse, nurse administered nausea medication during session.   Pt continent of bowel and bladder. Pt performed pericare with mod I.   Pt performed sit to stand, and ambulation x150 feet with RW and mod I-with therapist managing IV. Nurse present to disconnect IV during session.  Pt navigated 12 6 inch steps with unilateral handrail and supervision with step to gait, verbal cues provided for sequencing for pain management.   Pt ambulated throughout main gym and picked up various items at various heights with reacher and supervision progressing to mod I. Education provided regarding improved technique/positiioning. Pt endorses improved understanding of how to use it. Pt has bag on front of RW. PT recommended pt keep her reacher from home inside of the bag for improved accessibility.   Discussed methods to reduce fall risk at home including use of RW/reacher, removing throw rugs at home.   PT answered pt questions regarding discharge, follow up therapy.   Pt performed ambulatory transfer to car simulator with RW and supervision, pt required verbal reminder to sit first.   Pt navigated ramp with RW and distant supervision, verbal cues provided for safety with RW with emphasis on proximity.   Pt seated in recliner at end of session with all needs within reach and bed alarm on.     Therapy Documentation Precautions:  Precautions Precautions: Back, Fall Precaution Booklet Issued: No Recall of Precautions/Restrictions:  Intact Precaution/Restrictions Comments: able to verbalize 3/3 precautions Other Brace: no brace needed per orders Restrictions Weight Bearing Restrictions Per Provider Order: No  Therapy/Group: Individual Therapy  [facility] [name], [place], DPT  03/06/2024, 7:34 AM

## 2024-03-06 NOTE — Progress Notes (Signed)
 Occupational Therapy Session Note  Patient Details  Name: Diane Watkins MRN: [id number] Date of Birth: 02/06/1951  Today's Date: 03/06/2024 OT Individual Time: 1445-1500 OT Individual Time Calculation (min): 15 min    Short Term Goals: Week 1:  OT Short Term Goal 1 (Week 1): STG=LTG d/t ELOS OT Short Term Goal 1 - Progress (Week 1): Progressing toward goal  Skilled Therapeutic Interventions/Progress Updates:      Therapy Documentation Precautions:  Precautions Precautions: Back, Fall Precaution Booklet Issued: No Recall of Precautions/Restrictions: Intact Precaution/Restrictions Comments: able to verbalize 3/3 precautions Other Brace: no brace needed per orders Restrictions Weight Bearing Restrictions Per Provider Order: No General: Pt seated edge of bed upon OT arrival, agreeable to OT session. IV in place, started beeping at end of session, OT made nsg aware.   Pain: no pain reported  ADL: OT and pt discussed D/C plan and the plan for bathing at home since pt is not cleared to bathe. OT discussed hair washing caps and hair troph that pt could use at home until cleared to shower. OT educating pt on home health therapies and how she is relieved they will come to home so she will not have to travel as much. OT discussing energy conservation techniques in order to increase endurance for tasks. OT and pt also discussing kitchen/fridge situation and home and small accommodations pt can make in order to adhere to back precautions and be efficient with energy expenditure.    Pt EOB with IV in place with bed alarm activated, 2 bed rails up, call light within reach and 4Ps assessed.   Therapy/Group: Individual Therapy  Camie Hoe, OTD, OTR/L 03/06/2024, 3:29 PM

## 2024-03-06 NOTE — Plan of Care (Signed)
  Problem: Consults Goal: RH GENERAL PATIENT EDUCATION Description: See Patient Education module for education specifics. Outcome: Progressing   Problem: RH BOWEL ELIMINATION Goal: RH STG MANAGE BOWEL WITH ASSISTANCE Description: STG Manage Bowel with Assistance. Outcome: Progressing   Problem: RH BLADDER ELIMINATION Goal: RH STG MANAGE BLADDER WITH ASSISTANCE Description: STG Manage Bladder With  mod I Assistance Outcome: Progressing   Problem: RH SKIN INTEGRITY Goal: RH STG SKIN FREE OF INFECTION/BREAKDOWN Description: Manage skin free of infection with mod I assistance Outcome: Progressing   Problem: RH SAFETY Goal: RH STG ADHERE TO SAFETY PRECAUTIONS W/ASSISTANCE/DEVICE Description: STG Adhere to Safety Precautions With  mod I Assistance/Device. Outcome: Progressing   Problem: RH PAIN MANAGEMENT Goal: RH STG PAIN MANAGED AT OR BELOW PT'S PAIN GOAL Description: < 4 w/ prns  Outcome: Progressing   Problem: RH KNOWLEDGE DEFICIT GENERAL Goal: RH STG INCREASE KNOWLEDGE OF SELF CARE AFTER HOSPITALIZATION Description: Manage increase knowledge deficits f self care after hospitalization with  mod I assistance from spouse using educational materials provided Outcome: Progressing   Problem: Education: Goal: Ability to describe self-care measures that may prevent or decrease complications (Diabetes Survival Skills Education) will improve Outcome: Progressing Goal: Individualized Educational Video(s) Outcome: Progressing   Problem: Coping: Goal: Ability to adjust to condition or change in health will improve Outcome: Progressing   Problem: Fluid Volume: Goal: Ability to maintain a balanced intake and output will improve Outcome: Progressing   Problem: Health Behavior/Discharge Planning: Goal: Ability to identify and utilize available resources and services will improve Outcome: Progressing Goal: Ability to manage health-related needs will improve Outcome: Progressing    Problem: Metabolic: Goal: Ability to maintain appropriate glucose levels will improve Outcome: Progressing   Problem: Nutritional: Goal: Maintenance of adequate nutrition will improve Outcome: Progressing Goal: Progress toward achieving an optimal weight will improve Outcome: Progressing   Problem: Skin Integrity: Goal: Risk for impaired skin integrity will decrease Outcome: Progressing   Problem: Tissue Perfusion: Goal: Adequacy of tissue perfusion will improve Outcome: Progressing

## 2024-03-07 ENCOUNTER — Other Ambulatory Visit (HOSPITAL_COMMUNITY): Payer: Self-pay

## 2024-03-07 LAB — CBC
HCT: 28.9 % — ABNORMAL LOW (ref 36.0–46.0)
Hemoglobin: 9.4 g/dL — ABNORMAL LOW (ref 12.0–15.0)
MCH: 30.9 pg (ref 26.0–34.0)
MCHC: 32.5 g/dL (ref 30.0–36.0)
MCV: 95.1 fL (ref 80.0–100.0)
Platelets: 419 K/uL — ABNORMAL HIGH (ref 150–400)
RBC: 3.04 MIL/uL — ABNORMAL LOW (ref 3.87–5.11)
RDW: 14.4 % (ref 11.5–15.5)
WBC: 9 K/uL (ref 4.0–10.5)
nRBC: 0 % (ref 0.0–0.2)

## 2024-03-07 LAB — C-REACTIVE PROTEIN: CRP: 0.7 mg/dL (ref <1.0)

## 2024-03-07 LAB — COMPREHENSIVE METABOLIC PANEL WITH GFR
ALT: 42 U/L (ref 0–44)
AST: 24 U/L (ref 15–41)
Albumin: 2.6 g/dL — ABNORMAL LOW (ref 3.5–5.0)
Alkaline Phosphatase: 59 U/L (ref 38–126)
Anion gap: 9 (ref 5–15)
BUN: 17 mg/dL (ref 8–23)
CO2: 23 mmol/L (ref 22–32)
Calcium: 9 mg/dL (ref 8.9–10.3)
Chloride: 105 mmol/L (ref 98–111)
Creatinine, Ser: 0.79 mg/dL (ref 0.44–1.00)
GFR, Estimated: >60 mL/min (ref >60)
Glucose, Bld: 142 mg/dL — ABNORMAL HIGH (ref 70–99)
Potassium: 4.3 mmol/L (ref 3.5–5.1)
Sodium: 137 mmol/L (ref 135–145)
Total Bilirubin: 0.3 mg/dL (ref 0.0–1.2)
Total Protein: 5.8 g/dL — ABNORMAL LOW (ref 6.5–8.1)

## 2024-03-07 LAB — SEDIMENTATION RATE: Sed Rate: 50 mm/h — ABNORMAL HIGH (ref 0–22)

## 2024-03-07 MED ORDER — CEFEPIME IV (FOR PTA / DISCHARGE USE ONLY): 2.0000 g | Freq: Three times a day (TID) | INTRAVENOUS | 0 refills | Status: DC

## 2024-03-07 MED ORDER — CYANOCOBALAMIN 1000 MCG PO TABS: 1000.0000 ug | ORAL_TABLET | Freq: Every day | ORAL | Status: DC

## 2024-03-07 MED ORDER — TOPIRAMATE 25 MG PO TABS
25.0000 mg | ORAL_TABLET | Freq: Every day | ORAL | 0 refills | Status: DC
  Filled 2024-03-07: qty 30, 30d supply, fill #0

## 2024-03-07 MED ORDER — GABAPENTIN 100 MG PO CAPS
200.0000 mg | ORAL_CAPSULE | Freq: Three times a day (TID) | ORAL | 0 refills | Status: DC
  Filled 2024-03-07: qty 180, 30d supply, fill #0

## 2024-03-07 MED ORDER — ROSUVASTATIN CALCIUM 40 MG PO TABS: 40.0000 mg | ORAL_TABLET | Freq: Every evening | ORAL | Status: DC

## 2024-03-07 MED ORDER — TRAMADOL HCL 50 MG PO TABS
50.0000 mg | ORAL_TABLET | Freq: Four times a day (QID) | ORAL | 0 refills | Status: DC | PRN
  Filled 2024-03-07: qty 30, 8d supply, fill #0

## 2024-03-07 MED ORDER — DOCUSATE SODIUM 100 MG PO CAPS
100.0000 mg | ORAL_CAPSULE | Freq: Every day | ORAL | 0 refills | Status: AC
  Filled 2024-03-07: qty 30, 30d supply, fill #0

## 2024-03-07 MED ORDER — MAGNESIUM GLUCONATE 500 (27 MG) MG PO TABS
250.0000 mg | ORAL_TABLET | Freq: Every day | ORAL | 0 refills | Status: DC
  Filled 2024-03-07: qty 30, 60d supply, fill #0

## 2024-03-07 MED ORDER — LISINOPRIL 20 MG PO TABS: 20.0000 mg | ORAL_TABLET | Freq: Every evening | ORAL | Status: DC

## 2024-03-07 MED ORDER — DICLOFENAC SODIUM 1 % EX GEL
2.0000 g | Freq: Four times a day (QID) | CUTANEOUS | 0 refills | Status: DC
  Filled 2024-03-07: qty 100, 12d supply, fill #0

## 2024-03-07 MED ORDER — ENOXAPARIN SODIUM 60 MG/0.6ML IJ SOSY
50.0000 mg | PREFILLED_SYRINGE | INTRAMUSCULAR | Status: DC
  Administered 2024-03-08: 50 mg via SUBCUTANEOUS
  Filled 2024-03-07: qty 0.6

## 2024-03-07 MED ORDER — TRAZODONE HCL 50 MG PO TABS
25.0000 mg | ORAL_TABLET | Freq: Every evening | ORAL | 0 refills | Status: DC | PRN
  Filled 2024-03-07: qty 15, 15d supply, fill #0

## 2024-03-07 MED ORDER — CYCLOBENZAPRINE HCL 10 MG PO TABS
10.0000 mg | ORAL_TABLET | Freq: Three times a day (TID) | ORAL | 0 refills | Status: DC | PRN
  Filled 2024-03-07: qty 60, 20d supply, fill #0

## 2024-03-07 MED ORDER — PROCHLORPERAZINE MALEATE 5 MG PO TABS
5.0000 mg | ORAL_TABLET | Freq: Four times a day (QID) | ORAL | 0 refills | Status: AC | PRN
  Filled 2024-03-07: qty 30, 4d supply, fill #0

## 2024-03-07 MED ORDER — METOPROLOL TARTRATE 25 MG PO TABS
12.5000 mg | ORAL_TABLET | Freq: Two times a day (BID) | ORAL | 0 refills | Status: DC
  Filled 2024-03-07: qty 30, 30d supply, fill #0

## 2024-03-07 MED ORDER — POLYETHYLENE GLYCOL 3350 17 GM/SCOOP PO POWD
17.0000 g | Freq: Every day | ORAL | 0 refills | Status: AC
  Filled 2024-03-07: qty 238, 14d supply, fill #0

## 2024-03-07 MED ORDER — B COMPLEX-C PO TABS
1.0000 | ORAL_TABLET | Freq: Every day | ORAL | 0 refills | Status: AC
  Filled 2024-03-07: qty 30, 30d supply, fill #0

## 2024-03-07 NOTE — Progress Notes (Signed)
 Patient ID: Diane Watkins, female   DOB: [birth year], 73 y.o.   MRN: [id number] Spoke with [employer] who will be providing education today for her IV antibiotics for home. Pt feels good today and preparing to go home tomorrow

## 2024-03-07 NOTE — Progress Notes (Signed)
 Inpatient Rehabilitation Discharge Medication Review by a Pharmacist  A complete drug regimen review was completed for this patient to identify any potential clinically significant medication issues.  High Risk Drug Classes Is patient taking? Indication by Medication  Antipsychotic Yes Compazine - N/V  Anticoagulant No   Antibiotic Yes, as an intravenous medication Cefepime - bacteremia / lumbar hardware infection  Opioid Yes Ultram - acute pain  Antiplatelet No   Hypoglycemics/insulin  No   Vasoactive Medication Yes Lopressor , Zestril - HTN  Chemotherapy No   Other Yes Gabapentin - neuropathic pain Topamax - HA ppx Flexeril - muscle spasms Trazodone - sleep Crestor - HLD Zyrtec- allergy symptoms     Type of Medication Issue Identified Description of Issue Recommendation(s)  Drug Interaction(s) (clinically significant)     Duplicate Therapy     Allergy     No Medication Administration End Date     Incorrect Dose     Additional Drug Therapy Needed     Significant med changes from prior encounter (inform family/care partners about these prior to discharge).    Other       Clinically significant medication issues were identified that warrant physician communication and completion of prescribed/recommended actions by midnight of the next day:  No   Time spent performing this drug regimen review (minutes):  30   Harriette Tovey BS, PharmD, BCPS Clinical Pharmacist 03/07/2024 1:04 PM  Contact: [phone] after 3 PM

## 2024-03-07 NOTE — Progress Notes (Signed)
 Physical Therapy Discharge Summary  Patient Details  Name: Diane Watkins MRN: [id number] Date of Birth: [birth year]  Date of Discharge from PT service:March 07, 2024  Today's Date: 03/07/2024 PT Individual Time: 8694-8584  PT Treatment Time: 70 minutes  Missed 5 minutes due to toileting.   Patient has met 8 of 8 long term goals due to improved activity tolerance, improved balance, improved postural control, increased strength, increased range of motion, decreased pain, functional use of  right lower extremity and left lower extremity, improved attention, improved awareness, and improved coordination.  Patient to discharge at an ambulatory level Modified Independent.   Patient's care partner is independent to provide the necessary physical assistance at discharge.  Reasons goals not met: N/A  Recommendation:  Patient will benefit from ongoing skilled PT services in home health setting to continue to advance safe functional mobility, address ongoing impairments in endurance, and minimize fall risk.  Equipment: No equipment provided  Reasons for discharge: treatment goals met and discharge from hospital  Patient/family agrees with progress made and goals achieved: Yes  PT Discharge Precautions/Restrictions Precautions Precautions: Back;Fall Precaution Booklet Issued: No Recall of Precautions/Restrictions: Intact Precaution/Restrictions Comments: able to verbalize 3/3 precautions Required Braces or Orthoses: Other Brace Other Brace: no brace needed per orders Restrictions Weight Bearing Restrictions Per Provider Order: No Vital Signs   Pain 0/10   Pain Interference Pain Interference Pain Effect on Sleep: 0. Does not apply - I have not had any pain or hurting in the past 5 days Pain Interference with Therapy Activities: 0. Does not apply - I have not received rehabilitationtherapy in the past 5 days Pain Interference with Day-to-Day Activities: 1. Rarely or not at  all Vision/Perception  Vision - History Ability to See in Adequate Light: 0 Adequate Perception Perception: Within Functional Limits Praxis Praxis: WFL  Cognition Overall Cognitive Status: Within Functional Limits for tasks assessed Arousal/Alertness: Awake/alert Orientation Level: Oriented X4 Safety/Judgment: Appears intact Sensation Sensation Light Touch: Appears Intact Hot/Cold: Appears Intact Proprioception: Appears Intact Stereognosis: Not tested Coordination Gross Motor Movements are Fluid and Coordinated: Yes Fine Motor Movements are Fluid and Coordinated: Yes Motor  Motor Motor: Within Functional Limits  Mobility Bed Mobility Bed Mobility: Rolling Right;Rolling Left;Supine to Sit;Sit to Supine Rolling Right: Independent with assistive device Rolling Left: Independent with assistive device Supine to Sit: Independent with assistive device Sit to Supine: Independent with assistive device Transfers Transfers: Sit to Stand;Stand to Sit;Stand Pivot Transfers Sit to Stand: Independent with assistive device Stand to Sit: Independent with assistive device Stand Pivot Transfers: Independent with assistive device Transfer (Assistive device): Rolling walker Locomotion  Gait Ambulation: Yes Gait Assistance: Independent with assistive device Gait Distance (Feet): 450 Feet Assistive device: Rolling walker Gait Gait: Yes Gait Pattern: Step-through pattern;Decreased trunk rotation;Decreased stance time - right;Decreased step length - left Gait velocity: very slow Stairs / Additional Locomotion Stairs: Yes Stairs Assistance: Independent with assistive device;Supervision/Verbal cueing Stair Management Technique: One rail Left Number of Stairs: 8 Height of Stairs: 3 Ramp: Independent with assistive device Curb: Independent with assistive device Pick up small object from the floor assist level: Independent with assistive device Pick up small object from the floor assistive  device: reacher Wheelchair Mobility Wheelchair Mobility: No  Trunk/Postural Assessment  Cervical Assessment Cervical Assessment: Within Functional Limits Thoracic Assessment Thoracic Assessment: Within Functional Limits Lumbar Assessment Lumbar Assessment: Within Functional Limits Postural Control Postural Control: Within Functional Limits  Balance Balance Balance Assessed: Yes Static Sitting Balance Static Sitting - Balance Support: Feet  supported;Bilateral upper extremity supported Static Sitting - Level of Assistance: 6: Modified independent (Device/Increase time) Dynamic Sitting Balance Dynamic Sitting - Balance Support: During functional activity Dynamic Sitting - Level of Assistance: 6: Modified independent (Device/Increase time) Dynamic Sitting - Balance Activities: Reaching for objects Static Standing Balance Static Standing - Balance Support: During functional activity Static Standing - Level of Assistance: 6: Modified independent (Device/Increase time) Dynamic Standing Balance Dynamic Standing - Balance Support: During functional activity Dynamic Standing - Level of Assistance: 6: Modified independent (Device/Increase time) Dynamic Standing - Balance Activities: Reaching for objects;Reaching across midline Extremity Assessment  RUE Assessment RUE Assessment: Within Functional Limits LUE Assessment LUE Assessment: Within Functional Limits RLE Assessment RLE Assessment: Within Functional Limits Active Range of Motion (AROM) Comments: WNLs General Strength Comments: grossly WNLs RLE Strength RLE Overall Strength: Within Functional Limits for tasks assessed LLE Assessment LLE Assessment: Within Functional Limits Active Range of Motion (AROM) Comments: WNLs General Strength Comments: grossly WNL LLE Strength LLE Overall Strength: Within Functional Limits for tasks assessed   PT discussing equipment and home routine with pt in preparation for D/C.   Treatment  session: Pt in restroom when PT arrived.  Pt's DIL present in room.  Pt able to perform toileting and pericare mod I.  Pt performed all transfers with RW mod I.  Pt performed car transfers with set up for RW. Pt picked an object up off the floor with reacher and mod I. Pt ambulated across uneven terrain with rollator and without with mod I/S. Pt ascended/descended 8 stairs with x1 rails and Supervision.  Pt performed gait tx with 3/4# ankle wts with/without RW, emphasis on trunk rotation, UE swing; pt ambulated 250, 150, and 450 feet with mod I.  NuStep UE/LE x12' level 1. Pt returned to room and left sitting up EOB with all needs within reach.    Arland RAMAN Diane Watkins 03/07/2024, 3:48 PM

## 2024-03-07 NOTE — Progress Notes (Signed)
 PROGRESS NOTE   Subjective/Complaints:  Snored- and wakes her up- asking if can sleep on stomach. Coughs up phlegam, but doesn't feel sick.  LBM yesterday..    Didn't sleep all night due to waking self snoring.  Doesn't want sleeping meds, not even melatonin.  ROS: Per HPI   Pt denies SOB, abd pain, CP, N/V/C/D, and vision changes   + Constipation --improved/resolved + headache mostly at night--improved again today + Nocturia + Spasms  Objective:   No results found.   Recent Labs    03/07/24 0348  WBC 9.0  HGB 9.4*  HCT 28.9*  PLT 419*    Recent Labs    03/07/24 0348  NA 137  K 4.3  CL 105  CO2 23  GLUCOSE 142*  BUN 17  CREATININE 0.79  CALCIUM  9.0     Intake/Output Summary (Last 24 hours) at 03/07/2024 2001 Last data filed at 03/07/2024 1800 Gross per 24 hour  Intake 1361 ml  Output --  Net 1361 ml        Physical Exam: Vital Signs Blood pressure (!) 150/79, pulse 88, temperature 98 F (36.7 C), temperature source Oral, resp. rate 17, height 5' 2 (1.575 m), weight 93 kg, SpO2 96%.       General: awake, alert, appropriate,  supine in bed; NAD HENT: conjugate gaze; oropharynx moist CV: regular rate and rhythm; no JVD Pulmonary: CTA B/L; no W/R/R- good air movement GI: soft, NT, ND, (+)BS Psychiatric: appropriate- exhausted appearing Neurological: Ox3   Skin: incision has a little localized erythema, secondary to sutures- no drainage mild swelling-not visualized today Extremities: No clubbing, cyanosis; 1+ bilateral nonpitting edema Psych: Flat affect, but appropriate. Skin: Surgical incision with nonadherent dressing,  covered in clean and dry ABD pad  Neuro:  Awake, alert, oriented x 3. No apparent cognitive deficits. 5 out of 5 bilateral upper and lower extremities.  Sensation intact in bilateral legs to light touch    Assessment/Plan: 1. Functional deficits which  require 3+ hours per day of interdisciplinary therapy in a comprehensive inpatient rehab setting. Physiatrist is providing close team supervision and 24 hour management of active medical problems listed below. Physiatrist and rehab team continue to assess barriers to discharge/monitor patient progress toward functional and medical goals  Care Tool:  Bathing    Body parts bathed by patient: Left upper leg, Right arm, Left arm, Chest, Abdomen, Front perineal area, Buttocks, Right upper leg, Face   Body parts bathed by helper: Right lower leg, Left lower leg, Buttocks     Bathing assist Assist Level: Independent with assistive device     Upper Body Dressing/Undressing Upper body dressing   What is the patient wearing?: Pull over shirt, Bra    Upper body assist Assist Level: Independent with assistive device    Lower Body Dressing/Undressing Lower body dressing      What is the patient wearing?: Underwear/pull up     Lower body assist Assist for lower body dressing: Independent with assitive device     Toileting Toileting    Toileting assist Assist for toileting: Independent with assistive device     Transfers Chair/bed transfer  Transfers assist  Chair/bed transfer assist level: Independent with assistive device Chair/bed transfer assistive device: (P) Walker   Locomotion Ambulation   Ambulation assist      Assist level: Independent with assistive device Assistive device: Walker-rolling Max distance: 450'   Walk 10 feet activity   Assist     Assist level: Independent with assistive device Assistive device: Walker-rolling   Walk 50 feet activity   Assist    Assist level: Independent with assistive device Assistive device: Walker-rolling    Walk 150 feet activity   Assist    Assist level: Independent with assistive device Assistive device: Walker-rolling    Walk 10 feet on uneven surface  activity   Assist Walk 10 feet on uneven  surfaces activity did not occur: Safety/medical concerns   Assist level: Independent with assistive device Assistive device: Walker-rolling   Wheelchair     Assist Is the patient using a wheelchair?: No Type of Wheelchair: Manual    Wheelchair assist level: Total Assistance - Patient < 25%      Wheelchair 50 feet with 2 turns activity    Assist        Assist Level: Total Assistance - Patient < 25%   Wheelchair 150 feet activity     Assist      Assist Level: Total Assistance - Patient < 25%   Blood pressure (!) 150/79, pulse 88, temperature 98 F (36.7 C), temperature source Oral, resp. rate 17, height 5' 2 (1.575 m), weight 93 kg, SpO2 96%.  Medical Problem List and Plan: 1. 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  - In the afternoon patient was noted to have some increased serosanguineous drainage from inferior portion of incision.  Surgeon was notified to review, continue to monitor  9-13: Drainage appears stable, mild.  Continue current management.--Stable 9-14  9/15 seen by ortho today- continue current regimen    D/c set for 9/23 Con't CIR PT and OT Can sleep on stomach- doesn't want melatonin or other sleeping meds 2.  Antithrombotics: -DVT/anticoagulation:  Pharmaceutical: Lovenox              -antiplatelet therapy: N/A   3. Pain Management: Fioricet  and hydrocodone  prn for pain. Kpad ordered             --gabapentin  added at nights for hip pain             --Voltaren  gel added to right hip for local measures.   - 9/11 currently has tramadol  and hydrocodone  as needed for pain, metoprolol  started in may also benefit for headaches  -9/11 headaches  improved continue current regimen   - 9/13: Headache overnight increased, no PRNs until 6 AM this morning, occurs mostly at nighttime and with pressure on her head.  Start Topamax  25 mg nightly given she was not asking for PRNs despite headache.  9/15 HA doing better, continue current   9/16 - HA's again yesterday afternoon and overnight- not this AM- last pain meds just before midnight.   9/17- hasn't needed Morphine  in 24+ hours- I suggested taking tramadol   prn before therapy for R hip pain. 9/18- pt doing well with Tramadol - says doesn't want to take MSIR again- felt so out of it- said next time, won't take again- d/c'd meds   9/19 patient had headache this morning but improved in the afternoon.    9/20 headache much improved today, continue current regimen.  Has not required recent tramadol   9/21 headache remains proved, has not used tramadol  in a couple days.   Continue current regimen and monitor 9/22- no HA's 4. Mood/Behavior/Sleep: LCSW to follow for evaluation and support.              --Monitor sleep hygiene. Has had issues with insomnia. Will continue Xanax  prn for anxiety             -antipsychotic agents: N/A  5. Neuropsych/cognition: patient is able to make decision on her own behalf, medically.   6. Skin/Wound Care:  Routine pressure relief measures.   - 9-14: Lower part of surgical site with decreasing amount of drainage through the weekend, serous, stable   7. Fluids/Electrolytes/Nutrition: Monitor I/O. Encourage fluid intake.   8. 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 Vitamin C supplement started  - 9-14: Some subclinical fevers yesterday, on chart review has been getting these intermittently.  Having some urinary urgency and lower abdominal pain, so we will get urinalysis today, however should be decently covered with current antibiotics.  UA does not indicate infection   9/16- CRP 1.8 and  ESR 67- first time checked- ordered qweek 9. T2DM: Hgb A1C-7.0. d/c CBGs checks and ISS since well controlled  CBG (last 3)  No results for input(s): GLUCAP in the last 72 hours. Glucose 126 overall stable on last BMP    10. Leucocytosis improving from rise to 17.7-->11.4.  - WBC a little higher at 12.7 continue to monitor  9-13: WBC stable, 11-12.    9-14: Some subclinical fevers, 99.4, UA as above and CBC in a.m.  9/15 WBC 12.7, continue to monitor for signs of infection, Tmax 99.7   9/16- Tm 99.3- will recheck Labs Thursday unless things worse clinically.   9/17- Tm 99.0-   9/19 afebrile, WBC down to 10.5 11. 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    12. Anemia of chronic illness: Recheck CBC in am. Hgb trending down from 10.7-->9.8.             --monitor for signs of bleeding.   - Hemoglobin overall stable continue to follow  9-13: Hemoglobin down slightly to 8.8.   - iron WNL, TIBC low, consistent with anemia of chronic disease.   - FOBT negative  9/15 stable hgb at 8.9   9/18- Hb 8.6- slightly dipped- will monitor 2x/week  Recheck labs tomorrow 13.  Abnormal LFTs: Recheck in am.   -9/11 improved today to AST 57, ALT 60   9/17- will change to check qM/H  9/18- AST 49 and ALT 83- AST down from 57 and ALT up from 60- will recheck next Monday- if still up, will call Pharmacy- don't see any meds causing this myself- and off tylenol   9/22- AST 24 and ALT 42- both controlled 14. Rectosigmoid colitis?: Monitor intake. On Senna S. GIP negative.   - 9/12 will try to avoid stimulant laxatives  9-13: See #  18    15. 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    16. Tachycardia: magnesium  supplement started  - 9/11 mild intermittent tachycardia, may be pain related.  See #17  -9/12 intermittent mild tachycardia, continue current regimen and  monitor for now -9/20 heart rate stable continue to monitor  17. HTN: magnesium  supplement started  -9/11 metoprolol  12.5 mg twice daily  - 9/12 BP fair control overall, continue current regimen and monitor  - Ongoing fair control, monitor  -9/15 stable continue current regimen   9/17- 9/18BP was 150 systolic but overall, doing better  9/19-21 well-controlled continue current regimen    03/07/2024    4:39 AM 03/06/2024    8:33 PM 03/06/2024    2:24 PM  Vitals with BMI  Systolic 150 142 870  Diastolic 79 73 72  Pulse 88 81 86   18.  Constipation.  - 9/11 MiraLAX  34 g daily  -9/12 continue MiraLAX , will add lactulose .  Avoid stimulant laxatives when possible.  Will check abdomen x-ray to assess for stool burden  - 9-13: KUB without significant stool burden.  Ordered lactulose  for daily, docusate 200 mg BID.  Positive bowel movement this a.m.  After lactulose .  9/15 LBM today, improved   9/16- LBM yesterday- feeling good  9/17- LBM yesterday  9/18- LBM 5-6x yesterday- will decrease Colace from 200 mg BID to 100 mg daily. Told pt to hold today and restart lower dose tomorrow.   9/21 large bowel movement today appears to be having regular bowel movements, will DC lactulose  for now.  Can restart if needed  9/22 - LBM yesterday 19. Insomnia  9/18- will try Trazodone  25-50 mg at bedtime prn- discouraged pt to take Xananx for sleep esp since didn't take at home.  9/22- didn't want melatonin or anything for sleep   I spent a total of  37  minutes on total care today- >50% coordination of care- due to  D/w pt, review of labs and review of notes from weekend    LOS: 12 days A FACE TO FACE EVALUATION WAS PERFORMED  Sloane Junkin 03/07/2024, 8:01 PM

## 2024-03-07 NOTE — Plan of Care (Signed)
  Problem: RH Grooming Goal: LTG Patient will perform grooming w/assist,cues/equip (OT) Description: LTG: Patient will perform grooming with assist, with/without cues using equipment (OT) Outcome: Completed/Met Flowsheets (Taken 02/25/2024 1231) LTG: Pt will perform grooming with assistance level of: Independent with assistive device    Problem: RH Bathing Goal: LTG Patient will bathe all body parts with assist levels (OT) Description: LTG: Patient will bathe all body parts with assist levels (OT) Outcome: Completed/Met Flowsheets (Taken 02/25/2024 1231) LTG: Pt will perform bathing with assistance level/cueing: Independent with assistive device    Problem: RH Dressing Goal: LTG Patient will perform upper body dressing (OT) Description: LTG Patient will perform upper body dressing with assist, with/without cues (OT). Outcome: Completed/Met Flowsheets (Taken 02/25/2024 1231) LTG: Pt will perform upper body dressing with assistance level of: Independent with assistive device Goal: LTG Patient will perform lower body dressing w/assist (OT) Description: LTG: Patient will perform lower body dressing with assist, with/without cues in positioning using equipment (OT) Outcome: Completed/Met Flowsheets (Taken 02/25/2024 1231) LTG: Pt will perform lower body dressing with assistance level of: Independent with assistive device   Problem: RH Toileting Goal: LTG Patient will perform toileting task (3/3 steps) with assistance level (OT) Description: LTG: Patient will perform toileting task (3/3 steps) with assistance level (OT)  Outcome: Completed/Met Flowsheets (Taken 02/25/2024 1231) LTG: Pt will perform toileting task (3/3 steps) with assistance level: Independent with assistive device   Problem: RH Toilet Transfers Goal: LTG Patient will perform toilet transfers w/assist (OT) Description: LTG: Patient will perform toilet transfers with assist, with/without cues using equipment (OT) Outcome:  Completed/Met Flowsheets (Taken 02/25/2024 1231) LTG: Pt will perform toilet transfers with assistance level of: Independent with assistive device   Problem: RH Tub/Shower Transfers Goal: LTG Patient will perform tub/shower transfers w/assist (OT) Description: LTG: Patient will perform tub/shower transfers with assist, with/without cues using equipment (OT) Outcome: Completed/Met Flowsheets (Taken 02/25/2024 1231) LTG: Pt will perform tub/shower stall transfers with assistance level of: Independent with assistive device

## 2024-03-07 NOTE — Progress Notes (Signed)
 Occupational Therapy Discharge Summary  Patient Details  Name: Diane Watkins MRN: [id number] Date of Birth: 1951-05-21  Date of Discharge from OT service: March 07, 2024  Today's Date: 03/07/2024 OT Individual Time: 9084-8984 & 1440-1540 OT Individual Time Calculation (min): 60 min & 60 min    Patient has met 7 of 7 long term goals due to improved activity tolerance, improved balance, postural control, and ability to compensate for deficits.  Patient to discharge at overall Modified Independent level. Patient's care partner is independent to provide the necessary physical assistance at discharge.    Reasons goals not met: All goals met  Recommendation:  Patient will benefit from ongoing skilled OT services in home health setting to continue to advance functional skills in the area of BADL, iADL, and Reduce care partner burden.  Equipment: No equipment provided  Reasons for discharge: treatment goals met and discharge from hospital  Patient/family agrees with progress made and goals achieved: Yes  OT Discharge Precautions/Restrictions  Precautions Precautions: Back;Fall Precaution Booklet Issued: No Recall of Precautions/Restrictions: Intact Precaution/Restrictions Comments: able to verbalize 3/3 precautions Required Braces or Orthoses: Other Brace Other Brace: no brace needed per orders Restrictions Weight Bearing Restrictions Per Provider Order: No ADL ADL Eating: Modified independent Grooming: Modified independent Where Assessed-Grooming: Standing at sink, Sitting at sink Upper Body Bathing: Modified independent Where Assessed-Upper Body Bathing: Standing at sink, Sitting at sink Lower Body Bathing: Modified independent Where Assessed-Lower Body Bathing: Sitting at sink, Standing at sink Upper Body Dressing: Modified independent (Device) Where Assessed-Upper Body Dressing: Wheelchair Lower Body Dressing: Modified independent Where Assessed-Lower Body Dressing:  Sitting at sink, Standing at sink Toileting: Modified independent Where Assessed-Toileting: Neurosurgeon Method: Insurance claims handler: Modified independent Web designer Method: Unable to assess Film/video editor: Unable to assess ADL Comments: pt practicing shower transfers, although unable to bathe at shower level d/t central IV Vision Baseline Vision/History: 0 No visual deficits;1 Wears glasses Wears Glasses: Reading only Patient Visual Report: No change from baseline Vision Assessment?: No apparent visual deficits Perception  Perception: Within Functional Limits Praxis Praxis: WFL Cognition Cognition Overall Cognitive Status: Within Functional Limits for tasks assessed Arousal/Alertness: Awake/alert Safety/Judgment: Appears intact Sensation Sensation Light Touch: Appears Intact Hot/Cold: Appears Intact Proprioception: Appears Intact Stereognosis: Not tested Coordination Gross Motor Movements are Fluid and Coordinated: Yes Fine Motor Movements are Fluid and Coordinated: Yes Motor  Motor Motor: Within Functional Limits Mobility  Bed Mobility Bed Mobility: Rolling Right;Rolling Left;Supine to Sit;Sit to Supine Rolling Right: Independent with assistive device Rolling Left: Independent with assistive device Supine to Sit: Independent with assistive device Sit to Supine: Independent with assistive device Transfers Sit to Stand: Independent with assistive device Stand to Sit: Independent with assistive device  Trunk/Postural Assessment  Cervical Assessment Cervical Assessment: Within Functional Limits Thoracic Assessment Thoracic Assessment: Within Functional Limits Lumbar Assessment Lumbar Assessment: Within Functional Limits Postural Control Postural Control: Within Functional Limits  Balance Balance Balance Assessed: Yes Static Sitting Balance Static Sitting - Balance Support: Feet  supported;Bilateral upper extremity supported Static Sitting - Level of Assistance: 6: Modified independent (Device/Increase time) Dynamic Sitting Balance Dynamic Sitting - Balance Support: During functional activity Dynamic Sitting - Level of Assistance: 6: Modified independent (Device/Increase time) Dynamic Sitting - Balance Activities: Reaching for objects Static Standing Balance Static Standing - Balance Support: During functional activity Static Standing - Level of Assistance: 6: Modified independent (Device/Increase time) Dynamic Standing Balance Dynamic Standing - Balance Support: During functional activity Dynamic Standing -  Level of Assistance: 6: Modified independent (Device/Increase time) Dynamic Standing - Balance Activities: Reaching for objects;Reaching across midline Extremity/Trunk Assessment RUE Assessment RUE Assessment: Within Functional Limits LUE Assessment LUE Assessment: Within Functional Limits  Tx Session 1 General: Pt supine in bed upon OT arrival, agreeable to OT session. Pt reporting not sleeping well last night.  Pain: no pain reported  ADL: OT providing skilled intervention on ADL retraining in order to increase independence with tasks and increase activity tolerance. Pt completed the following tasks at the current level of assist listed above. Pt able to stand without UE support to brush teeth at mod I. OT discussing hair washing options at home d/t being unable to bathe. OT granting pt with mod I sign d/t mod I status. OT educating pt to ask for assistance if hooked up to IV for safety.   Pt seated in recliner at end of session with call light within reach and 4Ps assessed.    Session 2 General: Pt seated in W/C upon OT arrival, agreeable to OT. Pt husband present.   Pain: no pain present   Exercises: OT issued HEP in order to increase functional strength, endurance and activity tolerance in order to increase independence in ADLs such as bathing. Pt  issued red theraband and discussed direction/technique of exercises, demonstrating verbal understanding. Pt completed 3x10 exercises listed below: - Squat with Chair Touch  - 3 sets - 10 reps - Standing Marching  - 3 sets - 10 reps - Seated Hip Abduction with Resistance  - 3 sets - 10 reps - Seated Elbow Extension with Self-Anchored Resistance  - 3 sets - 10 reps  Pt seated EOB, 2 bed rails up, call light within reach and 4Ps assessed.   Camie Hoe, OTD, OTR/L 03/07/2024, 3:06 PM

## 2024-03-08 DIAGNOSIS — S76011D Strain of muscle, fascia and tendon of right hip, subsequent encounter: Secondary | ICD-10-CM | POA: Diagnosis not present

## 2024-03-08 DIAGNOSIS — Z9181 History of falling: Secondary | ICD-10-CM | POA: Diagnosis not present

## 2024-03-08 DIAGNOSIS — Z792 Long term (current) use of antibiotics: Secondary | ICD-10-CM | POA: Diagnosis not present

## 2024-03-08 DIAGNOSIS — I10 Essential (primary) hypertension: Secondary | ICD-10-CM | POA: Diagnosis not present

## 2024-03-08 DIAGNOSIS — E119 Type 2 diabetes mellitus without complications: Secondary | ICD-10-CM | POA: Diagnosis not present

## 2024-03-08 DIAGNOSIS — G4700 Insomnia, unspecified: Secondary | ICD-10-CM | POA: Diagnosis not present

## 2024-03-08 DIAGNOSIS — G9609 Other spinal cerebrospinal fluid leak: Secondary | ICD-10-CM | POA: Diagnosis not present

## 2024-03-08 DIAGNOSIS — Z452 Encounter for adjustment and management of vascular access device: Secondary | ICD-10-CM | POA: Diagnosis not present

## 2024-03-08 DIAGNOSIS — G9341 Metabolic encephalopathy: Secondary | ICD-10-CM | POA: Diagnosis not present

## 2024-03-08 DIAGNOSIS — Z79899 Other long term (current) drug therapy: Secondary | ICD-10-CM | POA: Diagnosis not present

## 2024-03-08 DIAGNOSIS — K219 Gastro-esophageal reflux disease without esophagitis: Secondary | ICD-10-CM | POA: Diagnosis not present

## 2024-03-08 DIAGNOSIS — D649 Anemia, unspecified: Secondary | ICD-10-CM | POA: Diagnosis not present

## 2024-03-08 DIAGNOSIS — F419 Anxiety disorder, unspecified: Secondary | ICD-10-CM | POA: Diagnosis not present

## 2024-03-08 DIAGNOSIS — T8463XA Infection and inflammatory reaction due to internal fixation device of spine, initial encounter: Secondary | ICD-10-CM | POA: Diagnosis not present

## 2024-03-08 DIAGNOSIS — E538 Deficiency of other specified B group vitamins: Secondary | ICD-10-CM | POA: Diagnosis not present

## 2024-03-08 DIAGNOSIS — B9689 Other specified bacterial agents as the cause of diseases classified elsewhere: Secondary | ICD-10-CM | POA: Diagnosis not present

## 2024-03-08 DIAGNOSIS — G039 Meningitis, unspecified: Secondary | ICD-10-CM | POA: Diagnosis not present

## 2024-03-08 DIAGNOSIS — R7881 Bacteremia: Secondary | ICD-10-CM | POA: Diagnosis not present

## 2024-03-08 DIAGNOSIS — E785 Hyperlipidemia, unspecified: Secondary | ICD-10-CM | POA: Diagnosis not present

## 2024-03-08 NOTE — Progress Notes (Addendum)
 Inpatient Rehabilitation Care Coordinator Discharge Note   Patient Details  Name: Diane Watkins MRN: [id number] Date of Birth: [birth year]   Discharge location: HOME WITH HUSBAND AND DAUGHTER AND DAUGHTER IN[place] TO ASSIST  Length of Stay: 13 DAYS  Discharge activity level: INDEPENDENT WITH DEVICE-SOME CGA LEVEL  Home/community participation: ACTIVE  Patient response un:Yzjouy Literacy - How often do you need to have someone help you when you read instructions, pamphlets, or other written material from your doctor or pharmacy?: Never  Patient response un:Dnrpjo Isolation - How often do you feel lonely or isolated from those around you?: Never  Services provided included: MD, RD, PT, OT, RN, CM, TR, Pharmacy, Neuropsych, SW  Financial Services:  Field seismologist Utilized: [employer] TE[employer]  Choices offered to/list presented to: PT AND HUSBAND  Follow-up services arranged:  Home Health, Patient/Family has no preference for HH/DME agencies Home Health Agency: [facility]  PT  OT  RN         Patient response to transportation need: Is the patient able to respond to transportation needs?: Yes In the past 12 months, has lack of transportation kept you from medical appointments or from getting medications?: No In the past 12 months, has lack of transportation kept you from meetings, work, or from getting things needed for daily living?: No   Patient/Family verbalized understanding of follow-up arrangements:  Yes  Individual responsible for coordination of the follow-up planBETHA SLATE [place] [phone]  Confirmed correct DME delivered: Raymonde Asberry MATSU 03/08/2024    Comments (or additional information): PT DID WELL AND REACHED HER GOALS EDUCATION COMPLETED WITH DAUGHTER IN[place] WHO IS A RN FOR THE IV ANTIBIOTICS. SHE WILL EDUCATE OTHER FAMILY MEMBERS.   Summary of Stay    Date/Time Discharge Planning CSW  03/01/24 0854 Home with husband  who can be there, but daughter in-law is going to assist during the day and daughter after work in the evenings. RGD       Ohm Dentler G

## 2024-03-08 NOTE — Discharge Summary (Signed)
 Physician Discharge Summary  Patient ID: Diane Watkins MRN: [id number] DOB/AGE: 1950/11/19 73 y.o.  Admit date: 02/24/2024 Discharge date: 03/08/2024  Discharge Diagnoses:  Principal Problem:   Postoperative CSF leak Active Problems:   Essential hypertension, benign   Primary osteoarthritis of both knees   GERD (gastroesophageal reflux disease)   S/P lumbar fusion   Headache   Meningitis-like reaction  T2DM  Acute on chronic insomnia  Constipation  ABLA   Discharged Condition: stable  Significant Diagnostic Studies: DG Abd 2 Views Result Date: 02/26/2024 CLINICAL DATA:  History of constipation EXAM: ABDOMEN - 2 VIEW COMPARISON:  CT from 02/23/2024 FINDINGS: Contrast from previous CT examination is now noted throughout the colon. No obstructive changes are seen. Mild retained fecal material is noted although no findings of constipation or obstruction are noted. Postsurgical changes in the lower lumbar spine and right upper quadrant noted. No free air is seen. IMPRESSION: Mild retained fecal material without significant colonic constipation. Electronically Signed   By: Oneil Devonshire M.D.   On: 02/26/2024 19:38    Labs:  Basic Metabolic Panel:    Latest Ref Rng & Units 03/07/2024    3:48 AM 03/03/2024    4:20 AM 02/29/2024    4:03 AM  BMP  Glucose 70 - 99 mg/dL 857  873  791   BUN 8 - 23 mg/dL 17  16  14    Creatinine 0.44 - 1.00 mg/dL 9.20  9.14  9.08   Sodium 135 - 145 mmol/L 137  138  136   Potassium 3.5 - 5.1 mmol/L 4.3  4.3  4.1   Chloride 98 - 111 mmol/L 105  101  102   CO2 22 - 32 mmol/L 23  25  26    Calcium  8.9 - 10.3 mg/dL 9.0  9.0  8.9      CBC:    Latest Ref Rng & Units 03/07/2024    3:48 AM 03/03/2024    4:20 AM 02/29/2024    4:03 AM  CBC  WBC 4.0 - 10.5 K/uL 9.0  10.5  12.7   Hemoglobin 12.0 - 15.0 g/dL 9.4  8.6  8.9   Hematocrit 36.0 - 46.0 % 28.9  26.7  27.1   Platelets 150 - 400 K/uL 419  478  491      CBG: No results for input(s): GLUCAP in the  last 168 hours.  Brief HPI:   Diane Watkins is a 73 y.o. female with history of T2DM, OA, HTN, GERD, transforaminal lumbar decompression with fusion 02/08/2024 was discharged to home on 02/10/2024.  She reported headaches at discharge and return to ED with severe unrelenting headaches.  CT head done revealing abnormal intracranial liquid and small new CHF density subtentorial posterior fossa with question of lipoid meningitis.  MRI lumbar spine showed suggestion of CSF leak with paraspinal muscle fluid collection at level of posterior hardware.  She underwent exploration of spine with repair of CSF leak and wound washout by Dr. Sim.  Neurology was consulted for input on persistent headaches with intermittent bout of confusion and recommended supportive care.  IV fluids were added to help and show CSF pressure remains normal and also due to poor p.o. intake.  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  Hospital course significant for fevers and she was started on Keflex  for concern due to cellulitis.  Blood cultures repeated revealing Enterobacter cloacae and ID  recommended narrowing antibiotics to cefepime  and 8 weeks of IV antibiotic regimen with end date of 11/01 followed by 6 months of suppression on Cipro due to presence of hardware.  She has also had issues with hip pain as well as anxiety and MRI hip done showing partial insertional tear right gluteus medius tendon and thickening/enhancement of rectosigmoid colon suspicious for colitis.  CT abdomen pelvis was repeated for follow-up and was negative for colitis or acute findings.  Her intake reported to be variable.  She continued to have severe pain right buttock and trochanter, issues with disorientation/delirium at nights, headaches as well as poor pain control. Therapy was consulted and patient will was requiring  contact-guard to min assist for mobility and min to mod assist with ADL tasks.  She was independent prior to admission and CIR was recommended due to functional decline.   Hospital Course: KANAI HILGER was admitted to rehab 02/24/2024 for inpatient therapies to consist of PT and OT at least three hours five days a week. Past admission physiatrist, therapy team and rehab RN have worked together to provide customized collaborative inpatient rehab. She continued to have issues with constant headaches, shoulder pain as well as sleep wake disruption. Hydrocodone  was ineffective and caused SE therefore was changed to morphine  with good results for a period of time. Low dose Topamax  was added to help manage HA as well as gabapentin  to help with pain and neuropathy. She reported concerns of sedative SE therefore morphine  was d/c. Tramadol  has been effective for use on prn basis. Constipation has resolved with titration of bowel medications  Po intake has gradually improved and patient advised to monitor BS bid ac and/or  HS after dBlood pressures and heart rate were monitored on TID basis and  has been managed on metoprolol . She is tolerating cefepime  without signs of SE.  She developed fluctuance with erythema at inferior aspect of her incision followed by drainage for serosanguinous fluid which has been monitored per NS recommendations and has resolved. Incision is C/D/I and sutures were removed at discharged. Follow up labs showed H/H to be stable  and leucocytosis has resolved. CRP and ESR are trending down. Abnormal LFTs have resolved. Check of BMET showed electrolyte and renal status to be WNL. She has made good gains during her stay requires intermittent supervision after discharge. She will continue to receive follow up HHPT, HHOT and HHRN by [facility] after discharge.     Rehab course: During patient's stay in rehab weekly team conferences were held to monitor patient's progress, set goals and  discuss barriers to discharge. At admission, patient required min assist with basic ADl tasks and with mobility. She  has had improvement in activity tolerance, balance, postural control as well as ability to compensate for deficits. She is able to complete ADL tasks at modified independent level. She is independent for tranfers and is able to ambulate 450' with use of RW. She is abel to climb 8 stairs with supervision. Family education has been completed.   Discharge disposition: 06-Home-Health Care Svc  Diet: Regular.   Special Instructions: No bending, twisting, arching or driving. Keep incision clean and dry.   Discharge Instructions     [facility] pharmacist to adjust dose for Vancomycin , Aminoglycosides and other anti-infective therapies as requested by physician.   Complete by: As directed    [facility] to provide Cath Flo 2mg    Complete by: As directed    Administer for PICC line occlusion  and as ordered by physician for other access device issues.   Ambulatory referral to Physical Medicine Rehab   Complete by: As directed    Hospital follow up/ Dr. Cornelio   Anaphylaxis Kit: Provided to treat any anaphylactic reaction to the medication being provided to the patient if First Dose or when requested by physician   Complete by: As directed    Epinephrine  1mg /ml vial / amp: Administer 0.3mg  (0.28ml) subcutaneously once for moderate to severe anaphylaxis, nurse to call physician and pharmacy when reaction occurs and call 911 if needed for immediate care   Diphenhydramine  50mg /ml IV vial: Administer 25-50mg  IV/IM PRN for first dose reaction, rash, itching, mild reaction, nurse to call physician and pharmacy when reaction occurs   Sodium Chloride  0.9% NS 500ml IV: Administer if needed for hypovolemic blood pressure drop or as ordered by physician after call to physician with anaphylactic reaction   Change dressing on IV access line weekly and PRN   Complete by: As  directed    Flush IV access with Sodium Chloride  0.9% and Heparin 10 units/ml or 100 units/ml   Complete by: As directed    Home infusion instructions - [facility]   Complete by: As directed    Instructions: Flush IV access with Sodium Chloride  0.9% and Heparin 10units/ml or 100units/ml   Change dressing on IV access line: Weekly and PRN   Instructions Cath Flo 2mg : Administer for PICC Line occlusion and as ordered by physician for other access device   [facility] pharmacist to adjust dose for: Vancomycin , Aminoglycosides and other anti-infective therapies as requested by physician   Method of administration may be changed at the discretion of home infusion pharmacist based upon assessment of the patient and/or caregiver's ability to self-administer the medication ordered   Complete by: As directed    Outpatient Parenteral Antibiotic Therapy Information Antibiotic: Cefepime  (Maxipime ) IVPB; Indications for use: bacteremia; End Date: 03/15/2024   Complete by: As directed    Antibiotic: Cefepime  (Maxipime ) IVPB   Indications for use: bacteremia   End Date: 03/15/2024      Allergies as of 03/08/2024       Reactions   Amoxil [amoxicillin] Other (See Comments)   Bad headaches Has patient had a PCN reaction causing immediate rash, facial/tongue/throat swelling, SOB or lightheadedness with hypotension: No Has patient had a PCN reaction causing severe rash involving mucus membranes or skin necrosis: No Has patient had a PCN reaction that required hospitalization: No Has patient had a PCN reaction occurring within the last 10 years: No If all of the above answers are NO, then may proceed with Cephalosporin use.   Oxycodone -acetaminophen  Itching        Medication List     STOP taking these medications    ibuprofen 200 MG tablet Commonly known as: ADVIL       TAKE these medications    acetaminophen  325 MG tablet Commonly known as: TYLENOL  Take 650 mg by  mouth every 6 (six) hours as needed (pain.).   B-complex with vitamin C tablet Take 1 tablet by mouth daily.   ceFEPime  IVPB Commonly known as: MAXIPIME  Inject 2 g into the vein every 8 (eight) hours. Indication:  Enterobacter cloacae bacteremia/lumbar hardware infection First Dose: Yes Last Day of Therapy:  04/16/24  Labs - Once weekly:  CBC/D and CMET Labs - Once weekly: ESR and CRP Method of administration: IV Push Method of administration may be changed at the discretion of home infusion pharmacist based  upon assessment of the patient and/or caregiver's ability to self-administer the medication ordered. What changed: additional instructions   cetirizine 10 MG tablet Commonly known as: ZYRTEC Take 10 mg by mouth daily as needed for allergies.   cyanocobalamin  1000 MCG tablet Take 1 tablet (1,000 mcg total) by mouth daily.   cyclobenzaprine  10 MG tablet Commonly known as: FLEXERIL  Take 1 tablet (10 mg total) by mouth 3 (three) times daily as needed for muscle spasms.   diclofenac  Sodium 1 % Gel Commonly known as: VOLTAREN  Apply 2 g topically 4 (four) times daily.   docusate sodium  100 MG capsule Commonly known as: COLACE Take 1 capsule (100 mg total) by mouth daily.   gabapentin  100 MG capsule Commonly known as: NEURONTIN  Take 2 capsules (200 mg total) by mouth 3 (three) times daily.   lisinopril  20 MG tablet Commonly known as: ZESTRIL  Take 1 tablet (20 mg total) by mouth every evening.   Mag-G 500 (27 Mg) MG Tabs tablet Generic drug: magnesium  gluconate Take 0.5 tablets (250 mg total) by mouth at bedtime.   metoprolol  tartrate 25 MG tablet Commonly known as: LOPRESSOR  Take 0.5 tablets (12.5 mg total) by mouth 2 (two) times daily.   omega-3 acid ethyl esters 1 g capsule Commonly known as: LOVAZA  Take 2 capsules (2 g total) by mouth 2 (two) times daily.   Polyethyl Glycol-Propyl Glycol 0.4-0.3 % Soln Place 1-2 drops into both eyes 3 (three) times daily as  needed (pain.).   polyethylene glycol powder 17 GM/SCOOP powder Commonly known as: GLYCOLAX /MIRALAX  Mix 17 g (1 capful) in 4-8 oz of liquid and take by mouth daily.   prochlorperazine  5 MG tablet Commonly known as: COMPAZINE  Take 1-2 tablets (5-10 mg total) by mouth every 6 (six) hours as needed for nausea.   rosuvastatin  40 MG tablet Commonly known as: CRESTOR  Take 1 tablet (40 mg total) by mouth every evening.   topiramate  25 MG tablet Commonly known as: TOPAMAX  Take 1 tablet (25 mg total) by mouth at bedtime.   traMADol  50 MG tablet--Rx# 30 pills. Commonly known as: ULTRAM  Take 1 tablet (50 mg total) by mouth every 6 (six) hours as needed for moderate pain (pain score 4-6).   traZODone  50 MG tablet Commonly known as: DESYREL  Take 0.5-1 tablets (25-50 mg total) by mouth at bedtime as needed for sleep.               Discharge Care Instructions  (From admission, onward)           Start     Ordered   03/07/24 0000  Change dressing on IV access line weekly and PRN  (Home infusion instructions - [facility] )        03/07/24 1138            Follow-up Information     Cook, Jayce G, DO Follow up.   Specialty: Family Medicine Why: Call in 1-2 days for post hospital follow up Contact information: [address] [place] [address] [phone]         Cornelio Bouchard, MD. Call.   Specialty: Physical Medicine and Rehabilitation Why: As needed Contact information: [address] [place] [address] [phone]         Reyne Cordella SQUIBB, MD Follow up.   Specialty: Orthopedic Surgery Why: Call in 1-2 days for post hospital follow up Contact information: [address] [place] [address] [phone]  Signed: Sharlet GORMAN Schmitz 03/09/2024, 12:34 PM

## 2024-03-08 NOTE — Progress Notes (Signed)
 Education given for picc line care.

## 2024-03-08 NOTE — Progress Notes (Signed)
 PROGRESS NOTE   Subjective/Complaints:  Pt reports she needs to get off toilet and back to room/bedside chair- due to IV cannot do herself.   We discussed f/u with me- explained since function is so well, probably can get her meds from PCP- since really only taking tramadol  and rare Fioricet  for HA's.  Off MSIR-  Did have bad HA at 10pm-  with a vengeance last night- but had held taking her gabapentin , so thinks that's why. Tramadol  was helpful after awhile.  Took tramadol  at 4am and went back to sleep- felt better this AM.    ROS: Per HPI   Pt denies SOB, abd pain, CP, N/V/C/D, and vision changes   + Constipation --improved/resolved + headache mostly at night--improved again today + Nocturia + Spasms  Objective:   No results found.   Recent Labs    03/07/24 0348  WBC 9.0  HGB 9.4*  HCT 28.9*  PLT 419*    Recent Labs    03/07/24 0348  NA 137  K 4.3  CL 105  CO2 23  GLUCOSE 142*  BUN 17  CREATININE 0.79  CALCIUM  9.0     Intake/Output Summary (Last 24 hours) at 03/08/2024 0850 Last data filed at 03/07/2024 1800 Gross per 24 hour  Intake 1336 ml  Output --  Net 1336 ml        Physical Exam: Vital Signs Blood pressure (!) 141/90, pulse 84, temperature 97.9 F (36.6 C), resp. rate 18, height 5' 2 (1.575 m), weight 93 kg, SpO2 98%.        General: awake, alert, appropriate, sitting on toilet; hooked to IV pole via PICC; NAD HENT: conjugate gaze; oropharynx moist CV: regular rate and rhythm; no JVD Pulmonary: CTA B/L; no W/R/R- good air movement GI: soft, NT, ND, (+)BS Psychiatric: appropriate- interactive Neurological: Ox3  Skin: incision has a little localized erythema, secondary to sutures- no drainage mild swelling-not visualized today Extremities: No clubbing, cyanosis; 1+ bilateral nonpitting edema Psych: Flat affect, but appropriate. Skin: Surgical incision with nonadherent  dressing,  covered in clean and dry ABD pad  Neuro:  Awake, alert, oriented x 3. No apparent cognitive deficits. 5 out of 5 bilateral upper and lower extremities.  Sensation intact in bilateral legs to light touch    Assessment/Plan: 1. Functional deficits which require 3+ hours per day of interdisciplinary therapy in a comprehensive inpatient rehab setting. Physiatrist is providing close team supervision and 24 hour management of active medical problems listed below. Physiatrist and rehab team continue to assess barriers to discharge/monitor patient progress toward functional and medical goals  Care Tool:  Bathing    Body parts bathed by patient: Left upper leg, Right arm, Left arm, Chest, Abdomen, Front perineal area, Buttocks, Right upper leg, Face   Body parts bathed by helper: Right lower leg, Left lower leg, Buttocks     Bathing assist Assist Level: Independent with assistive device     Upper Body Dressing/Undressing Upper body dressing   What is the patient wearing?: Pull over shirt, Bra    Upper body assist Assist Level: Independent with assistive device    Lower Body Dressing/Undressing Lower body dressing  What is the patient wearing?: Underwear/pull up     Lower body assist Assist for lower body dressing: Independent with assitive device     Toileting Toileting    Toileting assist Assist for toileting: Independent with assistive device     Transfers Chair/bed transfer  Transfers assist     Chair/bed transfer assist level: Independent with assistive device Chair/bed transfer assistive device: (P) Walker   Locomotion Ambulation   Ambulation assist      Assist level: Independent with assistive device Assistive device: Walker-rolling Max distance: 450'   Walk 10 feet activity   Assist     Assist level: Independent with assistive device Assistive device: Walker-rolling   Walk 50 feet activity   Assist    Assist level:  Independent with assistive device Assistive device: Walker-rolling    Walk 150 feet activity   Assist    Assist level: Independent with assistive device Assistive device: Walker-rolling    Walk 10 feet on uneven surface  activity   Assist Walk 10 feet on uneven surfaces activity did not occur: Safety/medical concerns   Assist level: Independent with assistive device Assistive device: Walker-rolling   Wheelchair     Assist Is the patient using a wheelchair?: No Type of Wheelchair: Manual    Wheelchair assist level: Total Assistance - Patient < 25%      Wheelchair 50 feet with 2 turns activity    Assist        Assist Level: Total Assistance - Patient < 25%   Wheelchair 150 feet activity     Assist      Assist Level: Total Assistance - Patient < 25%   Blood pressure (!) 141/90, pulse 84, temperature 97.9 F (36.6 C), resp. rate 18, height 5' 2 (1.575 m), weight 93 kg, SpO2 98%.  Medical Problem List and Plan: 1. 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  - In the afternoon patient was noted to have some increased serosanguineous drainage from inferior portion of incision.  Surgeon was notified to review, continue to monitor  9-13: Drainage appears stable, mild.  Continue current management.--Stable 9-14  9/15 seen by ortho today- continue current regimen    D/c today  Doesn't need f/u with me- is ok with follow up with PCP for tramadol  prn- will give 1 week of meds 2.  Antithrombotics: -DVT/anticoagulation:  Pharmaceutical: Lovenox              -antiplatelet therapy: N/A   3. Pain Management: Fioricet  and hydrocodone  prn for pain. Kpad ordered              --gabapentin  added at nights for hip pain             --Voltaren  gel added to right hip for local measures.   - 9/11 currently has tramadol  and hydrocodone  as needed for pain, metoprolol  started in may also benefit for headaches  -9/11 headaches improved continue current regimen   - 9/13: Headache overnight increased, no PRNs until 6 AM this morning, occurs mostly at nighttime and with pressure on  her head.  Start Topamax  25 mg nightly given she was not asking for PRNs despite headache.  9/15 HA doing better, continue current   9/16 - HA's again yesterday afternoon and overnight- not this AM- last pain meds just before midnight.   9/17- hasn't needed Morphine  in 24+ hours- I suggested taking tramadol  prn before therapy for R hip pain. 9/18- pt doing well with Tramadol - says doesn't want to take MSIR again- felt so out of it- said next time, won't take again- d/c'd meds   9/19 patient had headache this morning but improved in the afternoon.    9/20 headache much improved today, continue current regimen.  Has not required recent tramadol   9/21 headache remains proved, has not used tramadol  in a couple days.   Continue current regimen and monitor 9/23- got HA again last night when skipped gabapentin  yesterday- asked her to not decrease meds yet- can get tramadol  from PCP 4. Mood/Behavior/Sleep: LCSW to follow for evaluation and support.              --Monitor sleep hygiene. Has had issues with insomnia. Will continue Xanax  prn for anxiety             -antipsychotic agents: N/A  5. Neuropsych/cognition: patient is able to make decision on her own behalf, medically.   6. Skin/Wound Care:  Routine pressure relief measures.   - 9-14: Lower part of surgical site with decreasing amount of drainage through the weekend, serous, stable   9/23- looks good 7. Fluids/Electrolytes/Nutrition: Monitor I/O. Encourage fluid intake.   8. Enterobacter cloacae bacteremia: On IV cefepime  X 6 weeks with EOT  11/01 followed by 6 months of cipro for suppression. Vitamin C supplement started  - 9-14: Some subclinical fevers yesterday, on chart review has been getting these intermittently.  Having some urinary urgency and lower abdominal pain, so we will get urinalysis today, however should be decently covered with current antibiotics.  UA does not indicate infection   9/16- CRP 1.8 and ESR 67- first time checked- ordered qweek 9. T2DM: Hgb A1C-7.0. d/c CBGs checks and ISS since well controlled  CBG (last 3)  No results for input(s): GLUCAP in the last 72 hours. Glucose 126 overall stable on last BMP    10. Leucocytosis improving from rise to 17.7-->11.4.  - WBC a little higher at 12.7 continue to monitor  9-13: WBC stable, 11-12.    9-14: Some subclinical fevers, 99.4, UA as above and CBC in a.m.  9/15 WBC 12.7, continue to monitor for signs of infection, Tmax 99.7   9/16- Tm 99.3- will recheck Labs Thursday unless things worse clinically.   9/17- Tm 99.0-   9/19 afebrile, WBC down to 10.5  9/22- WBC 9.0- on IV ABX for a total of 50 days from admission to rehab- - going home with PICC trained in care and IV ABX per chart. Will need f/u with ID- CRP 0.7 and ESR 50- both downwards trending 11. 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     12. Anemia of chronic illness: Recheck CBC in am. Hgb trending down from 10.7-->9.8.             --monitor for signs of bleeding.   - Hemoglobin overall stable continue to follow  9-13: Hemoglobin down slightly to 8.8.   - iron WNL, TIBC low, consistent with  anemia of chronic disease.   - FOBT negative  9/15 stable hgb at 8.9   9/18- Hb 8.6- slightly dipped- will monitor 2x/week  9/23- Hb up to 9.4  Recheck labs tomorrow 13.  Abnormal LFTs: Recheck in am.   -9/11 improved today to AST 57, ALT 60   9/17- will change to check qM/H  9/18-  AST 49 and ALT 83- AST down from 57 and ALT up from 60- will recheck next Monday- if still up, will call Pharmacy- don't see any meds causing this myself- and off tylenol   9/22- AST 24 and ALT 42- both controlled and resolved as of today 14. Rectosigmoid colitis?: Monitor intake. On Senna S. GIP negative.   - 9/12 will try to avoid stimulant laxatives  9-13: See #18    15. 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    16. Tachycardia: magnesium  supplement started  - 9/11 mild intermittent tachycardia, may be pain related.  See #17  -9/12 intermittent mild tachycardia, continue current regimen and monitor for now -9/20 heart rate stable continue to monitor  17. HTN: magnesium  supplement started  -9/11 metoprolol  12.5 mg twice daily  - 9/12 BP fair control overall, continue current regimen and monitor  - Ongoing fair control, monitor  -9/15 stable continue current regimen   9/17- 9/18BP was 150 systolic but overall, doing better  9/19-21 well-controlled continue current regimen  9/23- slightly elevated this AM, but overall better    03/08/2024    5:17 AM 03/07/2024    8:30 PM 03/07/2024    4:39 AM  Vitals with BMI  Systolic 141 137 849  Diastolic 90 79 79  Pulse 84 95 88   18.  Constipation.  - 9/11 MiraLAX  34 g daily  -9/12 continue MiraLAX , will add lactulose .  Avoid stimulant laxatives when possible.  Will check abdomen x-ray to assess for stool burden  - 9-13: KUB without significant stool burden.  Ordered lactulose  for daily, docusate 200 mg BID.  Positive bowel movement this a.m.  After lactulose .  9/15 LBM today, improved   9/16- LBM yesterday- feeling good  9/17- LBM yesterday  9/18- LBM 5-6x yesterday- will decrease Colace from 200 mg BID to 100 mg daily. Told pt to hold today and restart lower dose tomorrow.   9/21 large bowel movement today appears to be having regular bowel movements, will DC lactulose  for now.  Can restart if needed  9/22 - LBM  yesterday  9/23- on toile-t just had BM 19. Insomnia  9/18- will try Trazodone  25-50 mg at bedtime prn- discouraged pt to take Xananx for sleep esp since didn't take at home.  9/22- didn't want melatonin or anything for sleep  9/23- slept somewhat better  I spent a total of  36  minutes on total care today- >50% coordination of care- due to  D/w pt about options- we decided to have her f/u with PCP- and not myself, unless her function doesn't improve as expected. Also d/w PA   The patient is medically ready for discharge to home and will not need follow-up with [facility]. In addition, they will need to follow up with their PCP, Neurosurgery, Neurology, and ID.    LOS: 13 days A FACE TO FACE EVALUATION WAS PERFORMED  Hazyl Marseille 03/08/2024, 8:50 AM

## 2024-03-08 NOTE — Plan of Care (Signed)
  Problem: Consults Goal: RH GENERAL PATIENT EDUCATION Description: See Patient Education module for education specifics. Outcome: Progressing   Problem: RH BOWEL ELIMINATION Goal: RH STG MANAGE BOWEL WITH ASSISTANCE Description: STG Manage Bowel with Assistance. Outcome: Progressing   Problem: RH BLADDER ELIMINATION Goal: RH STG MANAGE BLADDER WITH ASSISTANCE Description: STG Manage Bladder With  mod I Assistance Outcome: Progressing   Problem: RH SKIN INTEGRITY Goal: RH STG SKIN FREE OF INFECTION/BREAKDOWN Description: Manage skin free of infection with mod I assistance Outcome: Progressing   Problem: RH SAFETY Goal: RH STG ADHERE TO SAFETY PRECAUTIONS W/ASSISTANCE/DEVICE Description: STG Adhere to Safety Precautions With  mod I Assistance/Device. Outcome: Progressing   Problem: RH PAIN MANAGEMENT Goal: RH STG PAIN MANAGED AT OR BELOW PT'S PAIN GOAL Description: < 4 w/ prns  Outcome: Progressing   Problem: RH KNOWLEDGE DEFICIT GENERAL Goal: RH STG INCREASE KNOWLEDGE OF SELF CARE AFTER HOSPITALIZATION Description: Manage increase knowledge deficits f self care after hospitalization with  mod I assistance from spouse using educational materials provided Outcome: Progressing   Problem: Education: Goal: Ability to describe self-care measures that may prevent or decrease complications (Diabetes Survival Skills Education) will improve Outcome: Progressing Goal: Individualized Educational Video(s) Outcome: Progressing   Problem: Coping: Goal: Ability to adjust to condition or change in health will improve Outcome: Progressing   Problem: Fluid Volume: Goal: Ability to maintain a balanced intake and output will improve Outcome: Progressing   Problem: Health Behavior/Discharge Planning: Goal: Ability to identify and utilize available resources and services will improve Outcome: Progressing Goal: Ability to manage health-related needs will improve Outcome: Progressing    Problem: Metabolic: Goal: Ability to maintain appropriate glucose levels will improve Outcome: Progressing   Problem: Nutritional: Goal: Maintenance of adequate nutrition will improve Outcome: Progressing Goal: Progress toward achieving an optimal weight will improve Outcome: Progressing   Problem: Skin Integrity: Goal: Risk for impaired skin integrity will decrease Outcome: Progressing   Problem: Tissue Perfusion: Goal: Adequacy of tissue perfusion will improve Outcome: Progressing

## 2024-03-14 ENCOUNTER — Telehealth: Payer: Self-pay

## 2024-03-14 NOTE — Telephone Encounter (Signed)
 Copied from CRM [id number]. Topic: Clinical - Home Health Verbal Orders >> Mar 11, 2024 12:06 PM Antony RAMAN wrote: Caller/Agency: ashley hace with [facility] Callback Number: [phone] Service Requested: Occupational Therapy Frequency: 2x a week for 4 weeks Any new concerns about the patient? No

## 2024-03-15 DIAGNOSIS — G039 Meningitis, unspecified: Secondary | ICD-10-CM | POA: Diagnosis not present

## 2024-03-15 DIAGNOSIS — T8463XA Infection and inflammatory reaction due to internal fixation device of spine, initial encounter: Secondary | ICD-10-CM | POA: Diagnosis not present

## 2024-03-15 NOTE — Telephone Encounter (Signed)
 Verbal order given

## 2024-03-16 ENCOUNTER — Inpatient Hospital Stay: Payer: Self-pay | Admitting: Internal Medicine

## 2024-03-16 DIAGNOSIS — R291 Meningismus: Secondary | ICD-10-CM | POA: Diagnosis not present

## 2024-03-16 LAB — LAB REPORT - SCANNED: EGFR: 53

## 2024-03-22 ENCOUNTER — Telehealth: Payer: Self-pay

## 2024-03-22 ENCOUNTER — Inpatient Hospital Stay (HOSPITAL_COMMUNITY)

## 2024-03-22 ENCOUNTER — Inpatient Hospital Stay (HOSPITAL_COMMUNITY): Admission: EM | Admit: 2024-03-22 | Discharge: 2024-03-28 | DRG: 682 | Disposition: A | Discharge status: Home Health

## 2024-03-22 ENCOUNTER — Emergency Department (HOSPITAL_COMMUNITY)

## 2024-03-22 ENCOUNTER — Other Ambulatory Visit: Payer: Self-pay

## 2024-03-22 ENCOUNTER — Encounter (HOSPITAL_COMMUNITY): Payer: Self-pay | Admitting: Emergency Medicine

## 2024-03-22 DIAGNOSIS — E860 Dehydration: Secondary | ICD-10-CM | POA: Diagnosis not present

## 2024-03-22 DIAGNOSIS — I10 Essential (primary) hypertension: Secondary | ICD-10-CM | POA: Diagnosis not present

## 2024-03-22 DIAGNOSIS — G9341 Metabolic encephalopathy: Secondary | ICD-10-CM | POA: Diagnosis not present

## 2024-03-22 DIAGNOSIS — Z9049 Acquired absence of other specified parts of digestive tract: Secondary | ICD-10-CM | POA: Diagnosis not present

## 2024-03-22 DIAGNOSIS — N179 Acute kidney failure, unspecified: Principal | ICD-10-CM | POA: Diagnosis present

## 2024-03-22 DIAGNOSIS — Z8249 Family history of ischemic heart disease and other diseases of the circulatory system: Secondary | ICD-10-CM

## 2024-03-22 DIAGNOSIS — E861 Hypovolemia: Secondary | ICD-10-CM | POA: Diagnosis not present

## 2024-03-22 DIAGNOSIS — G039 Meningitis, unspecified: Secondary | ICD-10-CM | POA: Diagnosis not present

## 2024-03-22 DIAGNOSIS — K219 Gastro-esophageal reflux disease without esophagitis: Secondary | ICD-10-CM | POA: Diagnosis present

## 2024-03-22 DIAGNOSIS — Z981 Arthrodesis status: Secondary | ICD-10-CM | POA: Diagnosis not present

## 2024-03-22 DIAGNOSIS — E785 Hyperlipidemia, unspecified: Secondary | ICD-10-CM | POA: Diagnosis present

## 2024-03-22 DIAGNOSIS — T8463XD Infection and inflammatory reaction due to internal fixation device of spine, subsequent encounter: Secondary | ICD-10-CM | POA: Diagnosis not present

## 2024-03-22 DIAGNOSIS — K439 Ventral hernia without obstruction or gangrene: Secondary | ICD-10-CM | POA: Diagnosis not present

## 2024-03-22 DIAGNOSIS — Z885 Allergy status to narcotic agent status: Secondary | ICD-10-CM | POA: Diagnosis not present

## 2024-03-22 DIAGNOSIS — R4182 Altered mental status, unspecified: Secondary | ICD-10-CM | POA: Diagnosis not present

## 2024-03-22 DIAGNOSIS — Z6837 Body mass index (BMI) 37.0-37.9, adult: Secondary | ICD-10-CM | POA: Diagnosis not present

## 2024-03-22 DIAGNOSIS — K449 Diaphragmatic hernia without obstruction or gangrene: Secondary | ICD-10-CM | POA: Diagnosis not present

## 2024-03-22 DIAGNOSIS — E7800 Pure hypercholesterolemia, unspecified: Secondary | ICD-10-CM | POA: Diagnosis not present

## 2024-03-22 DIAGNOSIS — T361X5A Adverse effect of cephalosporins and other beta-lactam antibiotics, initial encounter: Secondary | ICD-10-CM | POA: Diagnosis present

## 2024-03-22 DIAGNOSIS — G253 Myoclonus: Secondary | ICD-10-CM | POA: Diagnosis not present

## 2024-03-22 DIAGNOSIS — D509 Iron deficiency anemia, unspecified: Secondary | ICD-10-CM | POA: Diagnosis present

## 2024-03-22 DIAGNOSIS — Z79899 Other long term (current) drug therapy: Secondary | ICD-10-CM

## 2024-03-22 DIAGNOSIS — E119 Type 2 diabetes mellitus without complications: Secondary | ICD-10-CM | POA: Diagnosis present

## 2024-03-22 DIAGNOSIS — R7881 Bacteremia: Secondary | ICD-10-CM | POA: Diagnosis not present

## 2024-03-22 DIAGNOSIS — G928 Other toxic encephalopathy: Secondary | ICD-10-CM | POA: Diagnosis present

## 2024-03-22 DIAGNOSIS — E66812 Obesity, class 2: Secondary | ICD-10-CM | POA: Diagnosis present

## 2024-03-22 DIAGNOSIS — T8463XA Infection and inflammatory reaction due to internal fixation device of spine, initial encounter: Secondary | ICD-10-CM | POA: Diagnosis not present

## 2024-03-22 DIAGNOSIS — E872 Acidosis, unspecified: Secondary | ICD-10-CM | POA: Diagnosis present

## 2024-03-22 DIAGNOSIS — Z792 Long term (current) use of antibiotics: Secondary | ICD-10-CM | POA: Diagnosis not present

## 2024-03-22 DIAGNOSIS — R112 Nausea with vomiting, unspecified: Secondary | ICD-10-CM | POA: Diagnosis present

## 2024-03-22 DIAGNOSIS — K5750 Diverticulosis of both small and large intestine without perforation or abscess without bleeding: Secondary | ICD-10-CM | POA: Diagnosis not present

## 2024-03-22 DIAGNOSIS — B9689 Other specified bacterial agents as the cause of diseases classified elsewhere: Secondary | ICD-10-CM | POA: Diagnosis not present

## 2024-03-22 DIAGNOSIS — B9621 Shiga toxin-producing Escherichia coli [E. coli] (STEC) O157 as the cause of diseases classified elsewhere: Secondary | ICD-10-CM | POA: Diagnosis not present

## 2024-03-22 DIAGNOSIS — Z961 Presence of intraocular lens: Secondary | ICD-10-CM | POA: Diagnosis present

## 2024-03-22 DIAGNOSIS — N10 Acute tubulo-interstitial nephritis: Secondary | ICD-10-CM | POA: Diagnosis not present

## 2024-03-22 DIAGNOSIS — E669 Obesity, unspecified: Secondary | ICD-10-CM | POA: Diagnosis not present

## 2024-03-22 DIAGNOSIS — Z881 Allergy status to other antibiotic agents status: Secondary | ICD-10-CM

## 2024-03-22 DIAGNOSIS — Z87898 Personal history of other specified conditions: Secondary | ICD-10-CM

## 2024-03-22 DIAGNOSIS — Z452 Encounter for adjustment and management of vascular access device: Secondary | ICD-10-CM | POA: Diagnosis not present

## 2024-03-22 DIAGNOSIS — D649 Anemia, unspecified: Secondary | ICD-10-CM | POA: Diagnosis not present

## 2024-03-22 DIAGNOSIS — K429 Umbilical hernia without obstruction or gangrene: Secondary | ICD-10-CM | POA: Diagnosis not present

## 2024-03-22 DIAGNOSIS — R291 Meningismus: Secondary | ICD-10-CM | POA: Diagnosis not present

## 2024-03-22 DIAGNOSIS — I6200 Nontraumatic subdural hemorrhage, unspecified: Secondary | ICD-10-CM | POA: Diagnosis not present

## 2024-03-22 LAB — CBC WITH DIFFERENTIAL/PLATELET
Abs Immature Granulocytes: 0.03 K/uL (ref 0.00–0.07)
Basophils Absolute: 0.1 K/uL (ref 0.0–0.1)
Basophils Relative: 1 %
Eosinophils Absolute: 0.3 K/uL (ref 0.0–0.5)
Eosinophils Relative: 4 %
HCT: 30.2 % — ABNORMAL LOW (ref 36.0–46.0)
Hemoglobin: 9.8 g/dL — ABNORMAL LOW (ref 12.0–15.0)
Immature Granulocytes: 0 %
Lymphocytes Relative: 18 %
Lymphs Abs: 1.3 K/uL (ref 0.7–4.0)
MCH: 31.1 pg (ref 26.0–34.0)
MCHC: 32.5 g/dL (ref 30.0–36.0)
MCV: 95.9 fL (ref 80.0–100.0)
Monocytes Absolute: 0.8 K/uL (ref 0.1–1.0)
Monocytes Relative: 11 %
Neutro Abs: 4.8 K/uL (ref 1.7–7.7)
Neutrophils Relative %: 66 %
Platelets: 197 K/uL (ref 150–400)
RBC: 3.15 MIL/uL — ABNORMAL LOW (ref 3.87–5.11)
RDW: 14.5 % (ref 11.5–15.5)
WBC: 7.2 K/uL (ref 4.0–10.5)
nRBC: 0 % (ref 0.0–0.2)

## 2024-03-22 LAB — SEDIMENTATION RATE: Sed Rate: 47 mm/h — ABNORMAL HIGH (ref 0–22)

## 2024-03-22 LAB — URINALYSIS, W/ REFLEX TO CULTURE (INFECTION SUSPECTED)
Bilirubin Urine: NEGATIVE
Glucose, UA: NEGATIVE mg/dL
Ketones, ur: NEGATIVE mg/dL
Leukocytes,Ua: NEGATIVE
Nitrite: NEGATIVE
Protein, ur: 30 mg/dL — AB
Specific Gravity, Urine: 1.008 (ref 1.005–1.030)
pH: 5 (ref 5.0–8.0)

## 2024-03-22 LAB — C-REACTIVE PROTEIN: CRP: <0.5 mg/dL (ref <1.0)

## 2024-03-22 LAB — COMPREHENSIVE METABOLIC PANEL WITH GFR
ALT: 25 U/L (ref 0–44)
AST: 27 U/L (ref 15–41)
Albumin: 2.6 g/dL — ABNORMAL LOW (ref 3.5–5.0)
Alkaline Phosphatase: 47 U/L (ref 38–126)
Anion gap: 10 (ref 5–15)
BUN: 53 mg/dL — ABNORMAL HIGH (ref 8–23)
CO2: 20 mmol/L — ABNORMAL LOW (ref 22–32)
Calcium: 9.7 mg/dL (ref 8.9–10.3)
Chloride: 111 mmol/L (ref 98–111)
Creatinine, Ser: 2.53 mg/dL — ABNORMAL HIGH (ref 0.44–1.00)
GFR, Estimated: 20 mL/min — ABNORMAL LOW (ref >60)
Glucose, Bld: 102 mg/dL — ABNORMAL HIGH (ref 70–99)
Potassium: 4.6 mmol/L (ref 3.5–5.1)
Sodium: 141 mmol/L (ref 135–145)
Total Bilirubin: 0.5 mg/dL (ref 0.0–1.2)
Total Protein: 5.9 g/dL — ABNORMAL LOW (ref 6.5–8.1)

## 2024-03-22 LAB — CK: Total CK: 77 U/L (ref 38–234)

## 2024-03-22 LAB — MAGNESIUM: Magnesium: 2.4 mg/dL (ref 1.7–2.4)

## 2024-03-22 LAB — TROPONIN I (HIGH SENSITIVITY): Troponin I (High Sensitivity): 8 ng/L (ref <18)

## 2024-03-22 LAB — PHOSPHORUS: Phosphorus: 3.9 mg/dL (ref 2.5–4.6)

## 2024-03-22 MED ORDER — SODIUM CHLORIDE 0.9 % IV SOLN
1.0000 g | Freq: Two times a day (BID) | INTRAVENOUS | Status: AC
  Administered 2024-03-22 – 2024-03-23 (×3): 1 g via INTRAVENOUS
  Filled 2024-03-22 (×3): qty 20

## 2024-03-22 MED ORDER — METOPROLOL TARTRATE 12.5 MG HALF TABLET
12.5000 mg | ORAL_TABLET | Freq: Two times a day (BID) | ORAL | Status: DC
  Administered 2024-03-22 – 2024-03-28 (×12): 12.5 mg via ORAL
  Filled 2024-03-22 (×12): qty 1

## 2024-03-22 MED ORDER — PANTOPRAZOLE SODIUM 40 MG IV SOLR
40.0000 mg | INTRAVENOUS | Status: DC
  Administered 2024-03-22 – 2024-03-27 (×6): 40 mg via INTRAVENOUS
  Filled 2024-03-22 (×6): qty 10

## 2024-03-22 MED ORDER — SODIUM CHLORIDE 0.9 % IV SOLN: 2.0000 g | INTRAVENOUS | Status: DC

## 2024-03-22 MED ORDER — SODIUM CHLORIDE 0.9 % IV BOLUS
1000.0000 mL | Freq: Once | INTRAVENOUS | Status: AC
  Administered 2024-03-22: 1000 mL via INTRAVENOUS

## 2024-03-22 MED ORDER — GABAPENTIN 100 MG PO CAPS: 200.0000 mg | ORAL_CAPSULE | Freq: Three times a day (TID) | ORAL | Status: DC

## 2024-03-22 MED ORDER — HEPARIN SODIUM (PORCINE) 5000 UNIT/ML IJ SOLN
5000.0000 [IU] | Freq: Three times a day (TID) | INTRAMUSCULAR | Status: DC
  Administered 2024-03-22 – 2024-03-28 (×18): 5000 [IU] via SUBCUTANEOUS
  Filled 2024-03-22 (×18): qty 1

## 2024-03-22 MED ORDER — SODIUM CHLORIDE 0.9% FLUSH
3.0000 mL | Freq: Two times a day (BID) | INTRAVENOUS | Status: DC
  Administered 2024-03-23 – 2024-03-28 (×10): 3 mL via INTRAVENOUS

## 2024-03-22 MED ORDER — ONDANSETRON HCL 4 MG PO TABS
4.0000 mg | ORAL_TABLET | Freq: Four times a day (QID) | ORAL | Status: DC | PRN
  Administered 2024-03-26 – 2024-03-27 (×2): 4 mg via ORAL
  Filled 2024-03-22 (×2): qty 1

## 2024-03-22 MED ORDER — ALBUTEROL SULFATE (2.5 MG/3ML) 0.083% IN NEBU: 2.5000 mg | INHALATION_SOLUTION | Freq: Four times a day (QID) | RESPIRATORY_TRACT | Status: DC | PRN

## 2024-03-22 MED ORDER — SODIUM CHLORIDE 0.9 % IV SOLN: INTRAVENOUS | Status: AC

## 2024-03-22 MED ORDER — ONDANSETRON HCL 4 MG/2ML IJ SOLN: 4.0000 mg | Freq: Four times a day (QID) | INTRAMUSCULAR | Status: DC | PRN

## 2024-03-22 MED ORDER — CEFEPIME IV (FOR PTA / DISCHARGE USE ONLY): 2.0000 g | Freq: Three times a day (TID) | INTRAVENOUS | Status: DC

## 2024-03-22 MED ORDER — GABAPENTIN 100 MG PO CAPS
100.0000 mg | ORAL_CAPSULE | Freq: Two times a day (BID) | ORAL | Status: DC
  Administered 2024-03-23 – 2024-03-24 (×3): 100 mg via ORAL
  Filled 2024-03-22 (×3): qty 1

## 2024-03-22 MED ORDER — ACETAMINOPHEN 650 MG RE SUPP: 650.0000 mg | Freq: Four times a day (QID) | RECTAL | Status: DC | PRN

## 2024-03-22 MED ORDER — ACETAMINOPHEN 325 MG PO TABS: 650.0000 mg | ORAL_TABLET | Freq: Four times a day (QID) | ORAL | Status: DC | PRN

## 2024-03-22 MED ORDER — ROSUVASTATIN CALCIUM 20 MG PO TABS
40.0000 mg | ORAL_TABLET | Freq: Every evening | ORAL | Status: DC
  Administered 2024-03-23 – 2024-03-28 (×6): 40 mg via ORAL
  Filled 2024-03-22 (×2): qty 2
  Filled 2024-03-22: qty 8
  Filled 2024-03-22 (×3): qty 2

## 2024-03-22 MED ORDER — ONDANSETRON HCL 4 MG/2ML IJ SOLN
4.0000 mg | Freq: Once | INTRAMUSCULAR | Status: AC
  Administered 2024-03-22: 4 mg via INTRAVENOUS
  Filled 2024-03-22: qty 2

## 2024-03-22 MED ORDER — SODIUM CHLORIDE (PF) 0.9 % IJ SOLN
INTRAMUSCULAR | Status: AC
  Administered 2024-03-22: 10 mL
  Filled 2024-03-22: qty 10

## 2024-03-22 MED ORDER — LACTATED RINGERS IV BOLUS
1000.0000 mL | Freq: Once | INTRAVENOUS | Status: AC
  Administered 2024-03-22: 1000 mL via INTRAVENOUS

## 2024-03-22 NOTE — ED Triage Notes (Signed)
 PT to ER via EMS from home.  Pt was todl by her home health nurse that she is dehydrated and needed to come to ER.  Pt has PICC line for home abx r/t meningitis from surgery. PT reports n/v since last night.

## 2024-03-22 NOTE — Significant Event (Signed)
 Discussed with patient's daughter-in-law who was at the bedside.  Patient's daughter-in-law states that patient has been having increasing myoclonic jerks increasing confusion and last 2 to 3 days having any nausea vomiting since starting of cefepime .  I reviewed patient's labs notes medications cultures.  I also examined the patient.  I discussed with on-call infectious disease consultant Dr. Juli who at this time requested changing cefepime  to meropenem.  And they will be seeing patient in consult.  Patient patient's daughter also stated that patient only takes gabapentin  100 twice a day.   Diane Watkins

## 2024-03-22 NOTE — Progress Notes (Signed)
 VAT consulted to assess PICC for newly admitted pt.  PICC flushed several times. No resistance noted or blood return.Gtt was stopped and Care RN notified  pt will need chest Xray to confirm placement before line can be used.  Consult complete

## 2024-03-22 NOTE — H&P (Signed)
 History and Physical    Patient: Diane Watkins FMW:[id number] DOB: 16-Nov-1950 DOA: 03/22/2024 DOS: the patient was seen and examined on 03/22/2024 PCP: Cook, Jayce G, DO  Patient coming from: Home  Chief Complaint:  Chief Complaint  Patient presents with   Dehydration   HPI: Diane Watkins is a 73 y.o. female with medical history significant of hypertension, hyperlipidemia, diabetes mellitus type 2, arthritis, and recent transforaminal lumbar decompression and fusion at L4-5 on 02/08/2024 presents with nausea and vomiting.  She was just recently hospitalized from 8/31-9/10 after presenting with headache following her recent surgical procedure.  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  Patient underwent MRI which revealed persistent leak on 9/1 and underwent exploration of the lumbar wound with closure of dural defect by Dr. Joshua on 9/3.  Blood cultures from 9/6 positive for Enterobacter Cloacae for which ID was consulted and recommended 8 weeks of IV antibiotics for which a PICC line was placed on 9/9.  She went to rehab following her hospitalization.    She reports that the nausea and vomiting started last night after eating and drinking, but also mentions having a pattern of eating and then experiencing nausea for a couple of days. She has not had a chance to eat or drink anything today. No stomach pain or diarrhea. She confirms urination without discomfort and has not experienced any recent falls. No fevers have been noted.  Her husband, who lives with her, has been eating the same food but has not experienced similar symptoms. She mentions taking a medication similar to omeprazole, prescribed for stomach issues. Occasional shortness of breath is reported.  Over the phone patient's husband notes that she has been a little altered at times and talking out of her head.  In the ED patient was noted to be afebrile with heart rates elevated up to 115, and all other vital  signs maintained.  Labs significant for hemoglobin 9.8, BUN 53, and creatinine 2.53.  CT scan of the head did not reveal any acute intracranial abnormality.  CT scan of the abdomen and pelvis notedNo acute abnormality with posterior rod and pedicle screw fixation at L4-L5 with stranding density posterior likely related to recent surgery with no discrete fluid collection appreciated.  Patient was bolused 2 L of IV fluids and Zofran .  TRH consulted to admit.    Review of Systems: As mentioned in the history of present illness. All other systems reviewed and are negative. Past Medical History:  Diagnosis Date   Arthritis    Diabetes mellitus without complication (HCC)    High cholesterol    History of hiatal hernia    Hyperlipidemia    Hypertension    Past Surgical History:  Procedure Laterality Date   BIOPSY  07/24/2021   Procedure: BIOPSY;  Surgeon: Golda Claudis PENNER, MD;  Location: [facility];  Service: Endoscopy;;  polyp   CATARACT EXTRACTION W/PHACO Left 03/29/2018   Procedure: CATARACT EXTRACTION PHACO AND INTRAOCULAR LENS PLACEMENT (IOC);  Surgeon: Perley Hamilton, MD;  Location: [facility];  Service: Ophthalmology;  Laterality: Left;  CDE: 10.13   CHOLECYSTECTOMY     COLONOSCOPY WITH PROPOFOL  N/A 07/24/2021   Procedure: COLONOSCOPY WITH PROPOFOL ;  Surgeon: Golda Claudis PENNER, MD;  Location: [facility];  Service: Endoscopy;  Laterality: N/A;   ESOPHAGOGASTRODUODENOSCOPY (EGD) WITH PROPOFOL  N/A 07/24/2021   Procedure: ESOPHAGOGASTRODUODENOSCOPY (EGD) WITH PROPOFOL ;  Surgeon: Golda Claudis PENNER, MD;  Location: [facility];  Service: Endoscopy;  Laterality: N/A;  HARDWARE REMOVAL N/A 02/17/2024   Procedure: REMOVAL, HARDWARE;  Surgeon: Reyne Cordella SQUIBB, MD;  Location: [facility];  Service: Orthopedics;  Laterality: N/A;   INCISIONAL HERNIA REPAIR N/A 09/04/2021   Procedure: HERNIA REPAIR INCISIONAL W/MESH;  Surgeon: Mavis Anes, MD;  Location: [facility];  Service: General;  Laterality: N/A;    POLYPECTOMY  07/24/2021   Procedure: POLYPECTOMY;  Surgeon: Golda Claudis PENNER, MD;  Location: [facility];  Service: Endoscopy;;   TRANSFORAMINAL LUMBAR INTERBODY FUSION (TLIF) WITH PEDICLE SCREW FIXATION 1 LEVEL N/A 02/08/2024   Procedure: TRANSFORAMINAL LUMBAR INTERBODY FUSION (TLIF) WITH PEDICLE SCREW FIXATION 1 LEVEL;  Surgeon: Reyne Cordella SQUIBB, MD;  Location: [facility];  Service: Orthopedics;  Laterality: N/A;  Transforaminal lumber interbody fusion with pedicle screw fixation, lumbar 4-lumbar 5   TUBAL LIGATION     WOUND EXPLORATION N/A 02/17/2024   Procedure: WOUND EXPLORATION;  Surgeon: Reyne Cordella SQUIBB, MD;  Location: [facility];  Service: Orthopedics;  Laterality: N/A;  Exploration of wound, Repair of Spinal fluid Leak Lumbar four-five  Removal of screws and Replacement of same screws   Social History:  reports that she has never smoked. She has been exposed to tobacco smoke. She has never used smokeless tobacco. She reports that she does not currently use alcohol . She reports that she does not use drugs.  Allergies  Allergen Reactions   Amoxil [Amoxicillin] Other (See Comments)    Bad headaches Has patient had a PCN reaction causing immediate rash, facial/tongue/throat swelling, SOB or lightheadedness with hypotension: No Has patient had a PCN reaction causing severe rash involving mucus membranes or skin necrosis: No Has patient had a PCN reaction that required hospitalization: No Has patient had a PCN reaction occurring within the last 10 years: No If all of the above answers are NO, then may proceed with Cephalosporin use.    Oxycodone -Acetaminophen  Itching    Family History  Problem Relation Age of Onset   Stroke Mother    Alcohol  abuse Father    Heart disease Father 26       MI    Prior to Admission medications   Medication Sig Start Date End Date Taking? Authorizing Provider  gabapentin  (NEURONTIN ) 100 MG capsule Take 2 capsules (200 mg total) by mouth 3 (three) times  daily. 03/07/24  Yes Love, Sharlet RAMAN, PA-C  traMADol  (ULTRAM ) 50 MG tablet Take 1 tablet (50 mg total) by mouth every 6 (six) hours as needed for moderate pain (pain score 4-6). 03/07/24  Yes Love, Sharlet RAMAN, PA-C  acetaminophen  (TYLENOL ) 325 MG tablet Take 650 mg by mouth every 6 (six) hours as needed (pain.). Patient not taking: Reported on 03/22/2024    [provider]  B Complex-C (B-COMPLEX WITH VITAMIN C) tablet Take 1 tablet by mouth daily. 03/08/24   Love, Sharlet RAMAN, PA-C  ceFEPime  (MAXIPIME ) IVPB Inject 2 g into the vein every 8 (eight) hours. Indication:  Enterobacter cloacae bacteremia/lumbar hardware infection First Dose: Yes Last Day of Therapy:  04/16/24  Labs - Once weekly:  CBC/D and CMET Labs - Once weekly: ESR and CRP Method of administration: IV Push Method of administration may be changed at the discretion of home infusion pharmacist based upon assessment of the patient and/or caregiver's ability to self-administer the medication ordered. 03/07/24   Love, Sharlet RAMAN, PA-C  cetirizine (ZYRTEC) 10 MG tablet Take 10 mg by mouth daily as needed for allergies.    [provider]  cyanocobalamin  1000 MCG tablet Take 1 tablet (1,000  mcg total) by mouth daily. 03/08/24   Love, Sharlet RAMAN, PA-C  cyclobenzaprine  (FLEXERIL ) 10 MG tablet Take 1 tablet (10 mg total) by mouth 3 (three) times daily as needed for muscle spasms. 03/07/24   Love, Sharlet RAMAN, PA-C  diclofenac  Sodium (VOLTAREN ) 1 % GEL Apply 2 g topically 4 (four) times daily. 03/07/24   Love, Sharlet RAMAN, PA-C  docusate sodium  (COLACE) 100 MG capsule Take 1 capsule (100 mg total) by mouth daily. 03/08/24   Love, Sharlet RAMAN, PA-C  lisinopril  (ZESTRIL ) 20 MG tablet Take 1 tablet (20 mg total) by mouth every evening. 03/07/24   Love, Sharlet RAMAN, PA-C  magnesium  gluconate (MAGONATE) 500 (27 Mg) MG TABS tablet Take 0.5 tablets (250 mg total) by mouth at bedtime. 03/07/24   Love, Sharlet RAMAN, PA-C  metoprolol  tartrate (LOPRESSOR ) 25 MG tablet Take  0.5 tablets (12.5 mg total) by mouth 2 (two) times daily. 03/07/24   Love, Sharlet RAMAN, PA-C  omega-3 acid ethyl esters (LOVAZA ) 1 g capsule Take 2 capsules (2 g total) by mouth 2 (two) times daily. 08/03/23   Cook, Jayce G, DO  Polyethyl Glycol-Propyl Glycol 0.4-0.3 % SOLN Place 1-2 drops into both eyes 3 (three) times daily as needed (pain.).    [provider]  polyethylene glycol powder (GLYCOLAX /MIRALAX ) 17 GM/SCOOP powder Mix 17 g (1 capful) in 4-8 oz of liquid and take by mouth daily. 03/08/24   Love, Sharlet RAMAN, PA-C  prochlorperazine  (COMPAZINE ) 5 MG tablet Take 1-2 tablets (5-10 mg total) by mouth every 6 (six) hours as needed for nausea. 03/07/24   Love, Sharlet RAMAN, PA-C  rosuvastatin  (CRESTOR ) 40 MG tablet Take 1 tablet (40 mg total) by mouth every evening. 03/07/24   Love, Sharlet RAMAN, PA-C  topiramate  (TOPAMAX ) 25 MG tablet Take 1 tablet (25 mg total) by mouth at bedtime. Patient not taking: Reported on 03/22/2024 03/07/24   Love, Sharlet RAMAN, PA-C  traZODone  (DESYREL ) 50 MG tablet Take 0.5-1 tablets (25-50 mg total) by mouth at bedtime as needed for sleep. 03/07/24   Maurice Sharlet RAMAN, PA-C    Physical Exam: Vitals:   03/22/24 1225 03/22/24 1530 03/22/24 1541 03/22/24 1545  BP:   135/72 123/71  Pulse:  86 85 83  Resp:  14 13 15   Temp:   (!) 97.4 F (36.3 C)   TempSrc:   Oral   SpO2:  99% 99% 100%  Weight: 93 kg     Height: 5' 2 (1.575 m)       Constitutional: Elderly female currently in no acute distress Eyes: PERRL, lids and conjunctivae normal ENMT: Mucous membranes are dry. Posterior pharynx clear of any exudate or lesions.  Neck: normal, supple  Respiratory: clear to auscultation bilaterally, no wheezing, no crackles. Normal respiratory effort. No accessory muscle use.  Cardiovascular: Regular rate and rhythm, no murmurs / rubs / gallops.   Lower extremity edema present.  2+ pedal pulses.  Abdomen: no tenderness, no masses palpated.   Bowel sounds positive.  Musculoskeletal: no  clubbing / cyanosis. No joint deformity upper and lower extremities. Good ROM, no contractures. Normal muscle tone.  Skin: no rashes, lesions, ulcers. No induration Neurologic: CN 2-12 grossly intact.   Strength 5/5 in all 4.  Psychiatric: Normal judgment and insight. Alert and oriented x person and place. Normal mood.   Data Reviewed:  Reviewed labs, imaging, and pertinent records as documented.  Assessment and Plan:  Acute kidney injury secondary to dehydration Labs noted creatinine 2.53 with BUN 53.  Baseline creatinine previously noted to be around 0.8.  Patient was given 2 L of IV fluids.  Urinalysis did not note significant signs for infection. - Admit to a telemetry bed - Strict I&Os - Avoid possible nephrotoxic agents - Continue normal saline IV fluids at 100 mL/h - Recheck kidney function in a.m.  Nausea and vomiting Patient reports nausea and vomiting symptoms over the last couple days.  Notes husband ate similar foods without any issue.  CT scan of the abdomen pelvis did not note any acute intra-abdominal abnormality as a cause of patient's symptoms. - Aspiration precautions with elevation head of bed - Protonix  IV for GI prophylaxis - Clear liquid diet and advance as tolerated  Acute metabolic encephalopathy Patient's husband noted that she had been confused and talking out of her head at times.  Suspect secondary to dehydration versus possibility of underlying infection.  CT scan of the head did not note any acute abnormality. - Check ESR and CRP -Delirium precautions - Continue to monitor  History of bacteremia Patient with history of Enterobacter cloacae bacteremia 9/6.  Seen by ID with recommendations of 8 weeks of IV antibiotics for which patient had PICC line placed on 9/9. - Check blood cultures - Continue IV cefepime   S/p lumbar fusion History of CSF leak Patient with history of CSF leak after decompression and fusion surgery at L4-L5 on 8/25.  Patient  underwent exploration of lumbar wound with closure of dural deficit on 9/3 after MRI noted findings suggestive of a CSF leak.  CT scan of the abdomen pelvis noted posterior rod and pedicle screw fixation at L4-L5 but strandy density posteriorly thought to likely be related to the recent surgery with no discrete fluid collection present.  Normocytic anemia Hemoglobin noted to be 9.8 which appears similar to prior. - Continue to monitor  Controlled diabetes mellitus type 2, without long-term use of insulin  On admission blood sugar noted to be 102.  Last available hemoglobin A1c noted to be 7 when checked 02/03/2024.  Patient is not on any medications for treatment. - Continue to monitor and determine need of placing patient on a sensitive sliding scale of insulin   Essential hypertension Blood pressures currently maintained. - Held lisinopril  due to AKI - Continue metoprolol   Hyperlipidemia - Continue atorvastatin   Obesity, class II BMI 37.5 kg/m  DVT prophylaxis: Heparin Advance Care Planning:   Code Status: Full Code    Consults: None  Family Communication: Husband updated over the phone   Severity of Illness: The appropriate patient status for this patient is INPATIENT. Inpatient status is judged to be reasonable and necessary in order to provide the required intensity of service to ensure the patient's safety. The patient's presenting symptoms, physical exam findings, and initial radiographic and laboratory data in the context of their chronic comorbidities is felt to place them at high risk for further clinical deterioration. Furthermore, it is not anticipated that the patient will be medically stable for discharge from the hospital within 2 midnights of admission.   * I certify that at the point of admission it is my clinical judgment that the patient will require inpatient hospital care spanning beyond 2 midnights from the point of admission due to high intensity of service, high  risk for further deterioration and high frequency of surveillance required.*  Author: Maximino DELENA Sharps, MD 03/22/2024 5:06 PM  For on call review [email or web address].

## 2024-03-22 NOTE — ED Provider Notes (Signed)
 [facility] AT [facility] Provider Note  CSN: [id number] Arrival date & time: 03/22/24 1221  Chief Complaint(s) Dehydration  HPI Diane Watkins is a 73 y.o. female who is here today for weakness, fatigue.  Patient had a recent surgery for a CSF leak, discharged September 23.  Was discharged home, with a PICC line for antibiotics.  She was seen by home health today who thought the patient appeared dehydrated recommended she come to the emergency room.  She reports that she has been feeling nauseated.  She has vomited a couple of times.  She has not had any abdominal pain.  No diarrhea.   Past Medical History Past Medical History:  Diagnosis Date   Arthritis    Diabetes mellitus without complication (HCC)    High cholesterol    History of hiatal hernia    Hyperlipidemia    Hypertension    Patient Active Problem List   Diagnosis Date Noted   Postoperative CSF leak 02/24/2024   Meningitis like reaction 02/15/2024   Headache 02/14/2024   Meningitis-like reaction 02/14/2024   S/P lumbar fusion 02/08/2024   Type 2 diabetes mellitus without complications (HCC) 12/28/2023   Preop examination 12/28/2023   GERD (gastroesophageal reflux disease) 11/14/2021   Hyperlipidemia 07/09/2019   Primary osteoarthritis of both knees 05/01/2015   Osteopenia 05/17/2014   Essential hypertension, benign 05/04/2013   Home Medication(s) Prior to Admission medications   Medication Sig Start Date End Date Taking? Authorizing Provider  gabapentin  (NEURONTIN ) 100 MG capsule Take 2 capsules (200 mg total) by mouth 3 (three) times daily. 03/07/24  Yes Love, Sharlet GORMAN, PA-C  traMADol  (ULTRAM ) 50 MG tablet Take 1 tablet (50 mg total) by mouth every 6 (six) hours as needed for moderate pain (pain score 4-6). 03/07/24  Yes Love, Sharlet GORMAN, PA-C  acetaminophen  (TYLENOL ) 325 MG tablet Take 650 mg by mouth every 6 (six) hours as needed (pain.). Patient not taking: Reported on 03/22/2024     [provider]  B Complex-C (B-COMPLEX WITH VITAMIN C) tablet Take 1 tablet by mouth daily. 03/08/24   Love, Sharlet GORMAN, PA-C  ceFEPime  (MAXIPIME ) IVPB Inject 2 g into the vein every 8 (eight) hours. Indication:  Enterobacter cloacae bacteremia/lumbar hardware infection First Dose: Yes Last Day of Therapy:  04/16/24  Labs - Once weekly:  CBC/D and CMET Labs - Once weekly: ESR and CRP Method of administration: IV Push Method of administration may be changed at the discretion of home infusion pharmacist based upon assessment of the patient and/or caregiver's ability to self-administer the medication ordered. 03/07/24   Love, Sharlet GORMAN, PA-C  cetirizine (ZYRTEC) 10 MG tablet Take 10 mg by mouth daily as needed for allergies.    [provider]  cyanocobalamin  1000 MCG tablet Take 1 tablet (1,000 mcg total) by mouth daily. 03/08/24   Love, Sharlet GORMAN, PA-C  cyclobenzaprine  (FLEXERIL ) 10 MG tablet Take 1 tablet (10 mg total) by mouth 3 (three) times daily as needed for muscle spasms. 03/07/24   Love, Sharlet GORMAN, PA-C  diclofenac  Sodium (VOLTAREN ) 1 % GEL Apply 2 g topically 4 (four) times daily. 03/07/24   Love, Sharlet GORMAN, PA-C  docusate sodium  (COLACE) 100 MG capsule Take 1 capsule (100 mg total) by mouth daily. 03/08/24   Love, Sharlet GORMAN, PA-C  lisinopril  (ZESTRIL ) 20 MG tablet Take 1 tablet (20 mg total) by mouth every evening. 03/07/24   Love, Sharlet GORMAN, PA-C  magnesium  gluconate (MAGONATE) 500 (27 Mg) MG  TABS tablet Take 0.5 tablets (250 mg total) by mouth at bedtime. 03/07/24   Love, Sharlet RAMAN, PA-C  metoprolol  tartrate (LOPRESSOR ) 25 MG tablet Take 0.5 tablets (12.5 mg total) by mouth 2 (two) times daily. 03/07/24   Love, Sharlet RAMAN, PA-C  omega-3 acid ethyl esters (LOVAZA ) 1 g capsule Take 2 capsules (2 g total) by mouth 2 (two) times daily. 08/03/23   Cook, Jayce G, DO  Polyethyl Glycol-Propyl Glycol 0.4-0.3 % SOLN Place 1-2 drops into both eyes 3 (three) times daily as needed (pain.).     [provider]  polyethylene glycol powder (GLYCOLAX /MIRALAX ) 17 GM/SCOOP powder Mix 17 g (1 capful) in 4-8 oz of liquid and take by mouth daily. 03/08/24   Love, Sharlet RAMAN, PA-C  prochlorperazine  (COMPAZINE ) 5 MG tablet Take 1-2 tablets (5-10 mg total) by mouth every 6 (six) hours as needed for nausea. 03/07/24   Love, Sharlet RAMAN, PA-C  rosuvastatin  (CRESTOR ) 40 MG tablet Take 1 tablet (40 mg total) by mouth every evening. 03/07/24   Love, Sharlet RAMAN, PA-C  topiramate  (TOPAMAX ) 25 MG tablet Take 1 tablet (25 mg total) by mouth at bedtime. Patient not taking: Reported on 03/22/2024 03/07/24   Love, Sharlet RAMAN, PA-C  traZODone  (DESYREL ) 50 MG tablet Take 0.5-1 tablets (25-50 mg total) by mouth at bedtime as needed for sleep. 03/07/24   Love, Sharlet RAMAN, PA-C                                                                                                                                    Past Surgical History Past Surgical History:  Procedure Laterality Date   BIOPSY  07/24/2021   Procedure: BIOPSY;  Surgeon: Golda Claudis PENNER, MD;  Location: [facility];  Service: Endoscopy;;  polyp   CATARACT EXTRACTION W/PHACO Left 03/29/2018   Procedure: CATARACT EXTRACTION PHACO AND INTRAOCULAR LENS PLACEMENT (IOC);  Surgeon: Perley Hamilton, MD;  Location: [facility];  Service: Ophthalmology;  Laterality: Left;  CDE: 10.13   CHOLECYSTECTOMY     COLONOSCOPY WITH PROPOFOL  N/A 07/24/2021   Procedure: COLONOSCOPY WITH PROPOFOL ;  Surgeon: Golda Claudis PENNER, MD;  Location: [facility];  Service: Endoscopy;  Laterality: N/A;   ESOPHAGOGASTRODUODENOSCOPY (EGD) WITH PROPOFOL  N/A 07/24/2021   Procedure: ESOPHAGOGASTRODUODENOSCOPY (EGD) WITH PROPOFOL ;  Surgeon: Golda Claudis PENNER, MD;  Location: [facility];  Service: Endoscopy;  Laterality: N/A;   HARDWARE REMOVAL N/A 02/17/2024   Procedure: REMOVAL, HARDWARE;  Surgeon: Reyne Cordella SQUIBB, MD;  Location: [facility];  Service: Orthopedics;  Laterality: N/A;   INCISIONAL HERNIA REPAIR N/A  09/04/2021   Procedure: HERNIA REPAIR INCISIONAL W/MESH;  Surgeon: Mavis Anes, MD;  Location: [facility];  Service: General;  Laterality: N/A;   POLYPECTOMY  07/24/2021   Procedure: POLYPECTOMY;  Surgeon: Golda Claudis PENNER, MD;  Location: [facility];  Service: Endoscopy;;   TRANSFORAMINAL LUMBAR INTERBODY FUSION (TLIF) WITH PEDICLE SCREW FIXATION 1 LEVEL  N/A 02/08/2024   Procedure: TRANSFORAMINAL LUMBAR INTERBODY FUSION (TLIF) WITH PEDICLE SCREW FIXATION 1 LEVEL;  Surgeon: Reyne Cordella SQUIBB, MD;  Location: [facility];  Service: Orthopedics;  Laterality: N/A;  Transforaminal lumber interbody fusion with pedicle screw fixation, lumbar 4-lumbar 5   TUBAL LIGATION     WOUND EXPLORATION N/A 02/17/2024   Procedure: WOUND EXPLORATION;  Surgeon: Reyne Cordella SQUIBB, MD;  Location: [facility] OR;  Service: Orthopedics;  Laterality: N/A;  Exploration of wound, Repair of Spinal fluid Leak Lumbar four-five  Removal of screws and Replacement of same screws   Family History Family History  Problem Relation Age of Onset   Stroke Mother    Alcohol  abuse Father    Heart disease Father 74       MI    Social History Social History   Tobacco Use   Smoking status: Never    Passive exposure: Past   Smokeless tobacco: Never  Vaping Use   Vaping status: Never Used  Substance Use Topics   Alcohol  use: Not Currently    Comment: rare - social   Drug use: No   Allergies Amoxil [amoxicillin] and Oxycodone -acetaminophen   Review of Systems Review of Systems  Physical Exam Vital Signs  I have reviewed the triage vital signs BP 123/71   Pulse 83   Temp (!) 97.4 F (36.3 C) (Oral)   Resp 15   Ht 5' 2 (1.575 m)   Wt 93 kg   SpO2 100%   BMI 37.50 kg/m   Physical Exam Vitals and nursing note reviewed.  Constitutional:      Appearance: She is not toxic-appearing.  HENT:     Mouth/Throat:     Mouth: Mucous membranes are dry.  Eyes:     Pupils: Pupils are equal, round, and reactive to light.  Cardiovascular:      Rate and Rhythm: Normal rate.  Pulmonary:     Effort: Pulmonary effort is normal.  Abdominal:     General: Abdomen is flat.     Palpations: Abdomen is soft.     Tenderness: There is no abdominal tenderness. There is no guarding.  Musculoskeletal:        General: Normal range of motion.  Skin:    General: Skin is warm.  Neurological:     General: No focal deficit present.     Mental Status: She is alert.     ED Results and Treatments Labs (all labs ordered are listed, but only abnormal results are displayed) Labs Reviewed  COMPREHENSIVE METABOLIC PANEL WITH GFR - Abnormal; Notable for the following components:      Result Value   CO2 20 (*)    Glucose, Bld 102 (*)    BUN 53 (*)    Creatinine, Ser 2.53 (*)    Total Protein 5.9 (*)    Albumin  2.6 (*)    GFR, Estimated 20 (*)    All other components within normal limits  CBC WITH DIFFERENTIAL/PLATELET - Abnormal; Notable for the following components:   RBC 3.15 (*)    Hemoglobin 9.8 (*)    HCT 30.2 (*)    All other components within normal limits  MAGNESIUM   PHOSPHORUS  URINALYSIS, W/ REFLEX TO CULTURE (INFECTION SUSPECTED)  TROPONIN I (HIGH SENSITIVITY)  Radiology CT ABDOMEN PELVIS WO CONTRAST Result Date: 03/22/2024 CLINICAL DATA:  Acute abdominal pain. EXAM: CT ABDOMEN AND PELVIS WITHOUT CONTRAST TECHNIQUE: Multidetector CT imaging of the abdomen and pelvis was performed following the standard protocol without IV contrast. RADIATION DOSE REDUCTION: This exam was performed according to the departmental dose-optimization program which includes automated exposure control, adjustment of the mA and/or kV according to patient size and/or use of iterative reconstruction technique. COMPARISON:  CT 02/23/2024 FINDINGS: Lower chest: Hypoventilatory changes in the lung bases. Hepatobiliary: No focal liver  abnormality is seen. Status post cholecystectomy. No biliary dilatation. Pancreas: No ductal dilatation or inflammation. Spleen: Normal in size without focal abnormality. Adrenals/Urinary Tract: Normal adrenal glands. No hydronephrosis, renal calculi, or evidence of renal inflammation. Unremarkable urinary bladder. Stomach/Bowel: Small hiatal hernia. Duodenal diverticulum. No bowel obstruction or inflammation. Moderate colonic stool burden. Left colonic diverticulosis without diverticulitis. Diminutive appendix tentatively visualized. Vascular/Lymphatic: Aortic and branch atherosclerosis. No aortic aneurysm. No enlarged lymph nodes in the abdomen or pelvis. Reproductive: Uterus and bilateral adnexa are unremarkable. Other: No free air or ascites. Diminutive fat containing umbilical hernia. Tiny fat containing ventral hernia in the supraumbilical region just to the right of midline. Musculoskeletal: Posterior rod and pedicle screw fixation L4-L5 with strandy density posteriorly, likely related to recent surgery. No discrete fluid collection. IMPRESSION: 1. No acute abnormality in the abdomen/pelvis. 2. Colonic diverticulosis without diverticulitis. 3. Small hiatal hernia. 4. Posterior rod and pedicle screw fixation L4-L5 with strandy density posteriorly, likely related to recent surgery. No discrete fluid collection. Aortic Atherosclerosis (ICD10-I70.0). Electronically Signed   By: Andrea Gasman M.D.   On: 03/22/2024 14:27   CT Head Wo Contrast Result Date: 03/22/2024 CLINICAL DATA:  Subdural hemorrhage EXAM: CT HEAD WITHOUT CONTRAST TECHNIQUE: Contiguous axial images were obtained from the base of the skull through the vertex without intravenous contrast. RADIATION DOSE REDUCTION: This exam was performed according to the departmental dose-optimization program which includes automated exposure control, adjustment of the mA and/or kV according to patient size and/or use of iterative reconstruction technique.  COMPARISON:  02/14/2024 FINDINGS: Brain: No evidence of acute infarction, hemorrhage, hydrocephalus, extra-axial collection or mass lesion/mass effect. Scattered tiny foci of intracranial lipid identified by prior CT and MRI are unchanged (series 2, image 13 11, 14). Vascular: No hyperdense vessel or unexpected calcification. Skull: Normal. Negative for fracture or focal lesion. Sinuses/Orbits: No acute finding. Other: None. IMPRESSION: 1. No acute intracranial pathology. 2. Scattered tiny foci of intracranial lipid identified by prior CT and MRI are unchanged. Electronically Signed   By: Marolyn JONETTA Jaksch M.D.   On: 03/22/2024 14:25    Pertinent labs & imaging results that were available during my care of the patient were reviewed by me and considered in my medical decision making (see MDM for details).  Medications Ordered in ED Medications  lactated ringers  bolus 1,000 mL (0 mLs Intravenous Stopped 03/22/24 1515)  ondansetron  (ZOFRAN ) injection 4 mg (4 mg Intravenous Given 03/22/24 1438)  sodium chloride  0.9 % bolus 1,000 mL (1,000 mLs Intravenous New Bag/Given 03/22/24 1647)  Procedures Procedures  (including critical care time)  Medical Decision Making / ED Course   This patient presents to the ED for concern of weakness, this involves an extensive number of treatment options, and is a complaint that carries with it a high risk of complications and morbidity.  The differential diagnosis includes dehydration, metabolic abnormalities, less likely obstruction, ACS, consider persistent CSF leak, consider SDH, consider UTI, less likely pneumonia.  MDM: Patient's vital signs much better when I am in the room than documented.  Her heart rate is in the 80s when I am in the room.  O2 saturation 98% on room air.  She is normotensive.  She has a soft abdomen.  Patient does  appear fatigued.  Will check labs, provide fluids, obtain imaging of the patient's head and abdomen.  Reassessment 5:20 PM-patient CT abdomen pelvis negative for acute process.  My dependent review the patient's head CT shows no intracranial hemorrhage.  Patient's creatinine significantly elevated from baseline.  Believe this is likely prerenal.  Patient has received 2 L of IV fluids.  Urinalysis still in progress.  Troponins not elevated.  Patient admitted to hospitalist service.   Additional history obtained: -Additional history obtained from husband at bedside -External records from outside source obtained and reviewed including: Chart review including previous notes, labs, imaging, consultation notes   Lab Tests: -I ordered, reviewed, and interpreted labs.   The pertinent results include:   Labs Reviewed  COMPREHENSIVE METABOLIC PANEL WITH GFR - Abnormal; Notable for the following components:      Result Value   CO2 20 (*)    Glucose, Bld 102 (*)    BUN 53 (*)    Creatinine, Ser 2.53 (*)    Total Protein 5.9 (*)    Albumin  2.6 (*)    GFR, Estimated 20 (*)    All other components within normal limits  CBC WITH DIFFERENTIAL/PLATELET - Abnormal; Notable for the following components:   RBC 3.15 (*)    Hemoglobin 9.8 (*)    HCT 30.2 (*)    All other components within normal limits  MAGNESIUM   PHOSPHORUS  URINALYSIS, W/ REFLEX TO CULTURE (INFECTION SUSPECTED)  TROPONIN I (HIGH SENSITIVITY)      EKG right bundle branch block, unchanged from prior EKG  EKG Interpretation Date/Time:    Ventricular Rate:    PR Interval:    QRS Duration:    QT Interval:    QTC Calculation:   R Axis:      Text Interpretation:           Imaging Studies ordered: I ordered imaging studies including CT abdomen pelvis, CT head I independently visualized and interpreted imaging. I agree with the radiologist interpretation   Medicines ordered and prescription drug management: Meds ordered  this encounter  Medications   lactated ringers  bolus 1,000 mL   ondansetron  (ZOFRAN ) injection 4 mg   sodium chloride  0.9 % bolus 1,000 mL    -I have reviewed the patients home medicines and have made adjustments as needed  Cardiac Monitoring: The patient was maintained on a cardiac monitor.  I personally viewed and interpreted the cardiac monitored which showed an underlying rhythm of: Normal sinus rhythm  Social Determinants of Health:  Multiple medical comorbidities including prior back surgery, lack of access to primary care.   Reevaluation: After the interventions noted above, I reevaluated the patient and found that they have :improved  Co morbidities that complicate the patient evaluation  Past Medical History:  Diagnosis Date  Arthritis    Diabetes mellitus without complication (HCC)    High cholesterol    History of hiatal hernia    Hyperlipidemia    Hypertension        Final Clinical Impression(s) / ED Diagnoses Final diagnoses:  Dehydration  AKI (acute kidney injury)     @PCDICTATION @    Mannie Pac T, DO 03/22/24 1721

## 2024-03-22 NOTE — Progress Notes (Signed)
New Admission Note:   Arrival Method:  Mental Orientation: Telemetry:  Assessment: Completed Skin: IV: Pain:  Tubes: Safety Measures: Safety Fall Prevention Plan has been discussed.  Admission: Completed 5MW Orientation: Patient has been oriented to the room, unit and staff.  Family:  Orders have been reviewed and implemented. Will continue to monitor the patient. Call light has been placed within reach and bed alarm has been activated.   Charito Bokiagon BSN, RN-BC Phone number: [phone] 

## 2024-03-22 NOTE — ED Notes (Signed)
 Per support person at bedside, pt uses a walker to get around at home.

## 2024-03-22 NOTE — ED Notes (Signed)
 Contacted floor to let them know pt is coming up. PT breathing is even and unlabored.  Support person at bedside.

## 2024-03-22 NOTE — Telephone Encounter (Signed)
 Diane Watkins with [facility] called stating patient is having a lot of nausea and vomiting. Patient denies abdominal pain, diarrhea and headaches. She also reports increased tremors.  Patient currently at the ED.  Jethro Radke [name], CMA

## 2024-03-23 ENCOUNTER — Telehealth: Payer: Self-pay | Admitting: *Deleted

## 2024-03-23 ENCOUNTER — Encounter (HOSPITAL_COMMUNITY): Payer: Self-pay | Admitting: Internal Medicine

## 2024-03-23 ENCOUNTER — Inpatient Hospital Stay: Admitting: Internal Medicine

## 2024-03-23 DIAGNOSIS — G928 Other toxic encephalopathy: Secondary | ICD-10-CM

## 2024-03-23 DIAGNOSIS — R112 Nausea with vomiting, unspecified: Secondary | ICD-10-CM | POA: Diagnosis not present

## 2024-03-23 DIAGNOSIS — N179 Acute kidney failure, unspecified: Secondary | ICD-10-CM | POA: Diagnosis not present

## 2024-03-23 DIAGNOSIS — T361X5A Adverse effect of cephalosporins and other beta-lactam antibiotics, initial encounter: Secondary | ICD-10-CM

## 2024-03-23 DIAGNOSIS — I10 Essential (primary) hypertension: Secondary | ICD-10-CM | POA: Diagnosis not present

## 2024-03-23 DIAGNOSIS — T8463XA Infection and inflammatory reaction due to internal fixation device of spine, initial encounter: Secondary | ICD-10-CM

## 2024-03-23 DIAGNOSIS — E119 Type 2 diabetes mellitus without complications: Secondary | ICD-10-CM | POA: Diagnosis not present

## 2024-03-23 LAB — BASIC METABOLIC PANEL WITH GFR
Anion gap: 10 (ref 5–15)
BUN: 50 mg/dL — ABNORMAL HIGH (ref 8–23)
CO2: 18 mmol/L — ABNORMAL LOW (ref 22–32)
Calcium: 8.9 mg/dL (ref 8.9–10.3)
Chloride: 112 mmol/L — ABNORMAL HIGH (ref 98–111)
Creatinine, Ser: 2.72 mg/dL — ABNORMAL HIGH (ref 0.44–1.00)
GFR, Estimated: 18 mL/min — ABNORMAL LOW (ref >60)
Glucose, Bld: 119 mg/dL — ABNORMAL HIGH (ref 70–99)
Potassium: 4.5 mmol/L (ref 3.5–5.1)
Sodium: 140 mmol/L (ref 135–145)

## 2024-03-23 LAB — CBC
HCT: 26.3 % — ABNORMAL LOW (ref 36.0–46.0)
Hemoglobin: 8.5 g/dL — ABNORMAL LOW (ref 12.0–15.0)
MCH: 30.2 pg (ref 26.0–34.0)
MCHC: 32.3 g/dL (ref 30.0–36.0)
MCV: 93.6 fL (ref 80.0–100.0)
Platelets: 183 K/uL (ref 150–400)
RBC: 2.81 MIL/uL — ABNORMAL LOW (ref 3.87–5.11)
RDW: 14.3 % (ref 11.5–15.5)
WBC: 7.3 K/uL (ref 4.0–10.5)
nRBC: 0 % (ref 0.0–0.2)

## 2024-03-23 MED ORDER — ACETAMINOPHEN 500 MG PO TABS
1000.0000 mg | ORAL_TABLET | Freq: Four times a day (QID) | ORAL | Status: DC | PRN
  Administered 2024-03-26 – 2024-03-28 (×5): 1000 mg via ORAL
  Filled 2024-03-23 (×5): qty 2

## 2024-03-23 MED ORDER — ACETAMINOPHEN 650 MG RE SUPP: 650.0000 mg | Freq: Four times a day (QID) | RECTAL | Status: DC | PRN

## 2024-03-23 MED ORDER — SODIUM CHLORIDE 0.9 % IV SOLN
500.0000 mg | INTRAVENOUS | Status: DC
  Administered 2024-03-24 – 2024-03-28 (×5): 500 mg via INTRAVENOUS
  Filled 2024-03-23 (×5): qty 500

## 2024-03-23 MED ORDER — CHLORHEXIDINE GLUCONATE CLOTH 2 % EX PADS
6.0000 | MEDICATED_PAD | Freq: Every day | CUTANEOUS | Status: DC
  Administered 2024-03-23 – 2024-03-28 (×6): 6 via TOPICAL

## 2024-03-23 NOTE — Progress Notes (Signed)
   Inpatient Rehab Admissions Coordinator :  Per therapy recommendations, patient was screened for CIR candidacy by Heron Leavell RN MSN.  Recent CIR stay 9/10 until 9/23 and went home at  Mod I to some CGA level.  At this time patient appears to be a potential candidate for CIR. I will place a rehab consult per protocol for full assessment. Please call me with any questions.  Heron Leavell RN MSN Admissions Coordinator [phone]

## 2024-03-23 NOTE — Assessment & Plan Note (Addendum)
 03/23/24 stable. Stop SSI.

## 2024-03-23 NOTE — Assessment & Plan Note (Addendum)
 03/23/24 likely a combo of dehydration, AKI and possibly neurotoxicity from cefepime . Now off IV cefepime . Resolved.

## 2024-03-23 NOTE — Progress Notes (Addendum)
 PROGRESS NOTE    Diane Watkins  FMW:[id number] DOB: January 13, 1951 DOA: 03/22/2024 PCP: Cook, Jayce G, DO  Subjective: Pt seen and examined. Met with son-in-law at bedside. Pt knows she was not drinking enough liquids at home. Still on IV cefepime  at home. Having tremors of her arms. IV ABX changed to meropenem after admitting provider talked with ID.   Hospital Course: CC: dehydration HPI: Diane Watkins is a 73 y.o. female with medical history significant of hypertension, hyperlipidemia, diabetes mellitus type 2, arthritis, and recent transforaminal lumbar decompression and fusion at L4-5 on 02/08/2024 presents with nausea and vomiting.   She was just recently hospitalized from 8/31-9/10 after presenting with headache following her recent surgical procedure.  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  Patient underwent MRI which revealed persistent leak on 9/1 and underwent exploration of the lumbar wound with closure of dural defect by Dr. Joshua on 9/3.  Blood cultures from 9/6 positive for Enterobacter Cloacae for which ID was consulted and recommended 8 weeks of IV antibiotics for which a PICC line was placed on 9/9.  She went to rehab following her hospitalization.     She reports that the nausea and vomiting started last night after eating and drinking, but also mentions having a pattern of eating and then experiencing nausea for a couple of days. She has not had a chance to eat or drink anything today. No stomach pain or diarrhea. She confirms urination without discomfort and has not experienced any recent falls. No fevers have been noted.   Her husband, who lives with her, has been eating the same food but has not experienced similar symptoms. She mentions taking a medication similar to omeprazole, prescribed for stomach issues. Occasional shortness of breath is reported.   Over the phone patient's husband notes that she has been a little altered at times and talking out of  her head.   In the ED patient was noted to be afebrile with heart rates elevated up to 115, and all other vital signs maintained.  Labs significant for hemoglobin 9.8, BUN 53, and creatinine 2.53.  CT scan of the head did not reveal any acute intracranial abnormality.  CT scan of the abdomen and pelvis notedNo acute abnormality with posterior rod and pedicle screw fixation at L4-L5 with stranding density posterior likely related to recent surgery with no discrete fluid collection appreciated.  Patient was bolused 2 L of IV fluids and Zofran .  TRH consulted to admit.  Significant Events: Admitted 03/22/2024 for AKI and dehydration   Admission Labs: WBC 7.2, HgB 9.8, plt 197 Mg 2.4 Na 141, K 4.6, CO2 of 20, BUN 53, Scr 2.53, glu 102 T. Prot 5.9, alb 2.6, AST 27, ALT 25, alk phos 47, t. Bili 0.5 UA 1.008, protein 30, negative Nitrite, Negative LE CK 77 CRP < 0.5 ESR 47  Admission Imaging Studies: CT abd/pelvis No acute abnormality in the abdomen/pelvis. 2. Colonic diverticulosis without diverticulitis. 3. Small hiatal hernia. 4. Posterior rod and pedicle screw fixation L4-L5 with strandy density posteriorly, likely related to recent surgery. No discrete fluid collection. CT head  No acute intracranial pathology. 2. Scattered tiny foci of intracranial lipid identified by prior CT and MRI are unchanged. CXR No active disease.   Significant Labs:   Significant Imaging Studies:   Antibiotic Therapy: Anti-infectives (From admission, onward)    Start     Dose/Rate Route Frequency Ordered Stop   03/22/24 2215  meropenem (MERREM) 1 g in sodium chloride   0.9 % 100 mL IVPB        1 g 200 mL/hr over 30 Minutes Intravenous Every 12 hours 03/22/24 2121     03/22/24 2200  ceFEPime  (MAXIPIME ) IVPB  Status:  Discontinued       Note to Pharmacy: Indication:  Enterobacter cloacae bacteremia/lumbar hardware infection First Dose: Yes Last Day of Therapy:  04/16/24  Labs - Once weekly:  CBC/D and  CMET Labs - Once weekly: ESR and CRP Method of administration: IV Push Method of administration may be changed at the discretion of home infusio   2 g Intravenous Every 8 hours 03/22/24 1758 03/22/24 1836   03/22/24 1845  ceFEPIme  (MAXIPIME ) 2 g in sodium chloride  0.9 % 100 mL IVPB  Status:  Discontinued        2 g 200 mL/hr over 30 Minutes Intravenous Every 24 hours 03/22/24 1836 03/22/24 2113       Procedures:   Consultants: Neurology    Assessment and Plan: * AKI (acute kidney injury) 03/23/24 probably a combination of dehydration due to poor po intake and possibly interstitial nephritis from Cefepime . Remains on IVF. Pt took off her silver-colored ring off left 4th finger and gave it to her son-in-law. He placed ring in her wallet. Repeat BMP in AM.   Nausea and vomiting 03/23/24 contributes to her AKI. Continue with Ivf. Pt is hunger now. Change to solid food.   Acute metabolic encephalopathy-resolved as of 03/23/2024 03/23/24 likely a combo of dehydration, AKI and possibly neurotoxicity from cefepime . Now off IV cefepime . Resolved.   Obesity (BMI 30-39.9) Body mass index is 37.22 kg/m.   History of bacteremia - enterobacter cloacae from 02-20-2024. 03/23/24    S/P lumbar fusion 03/23/24 had surgery on 02-08-2024, then post-op leak fix on 02-17-2024.   Controlled type 2 diabetes mellitus without complication, without long-term current use of insulin  (HCC) 03/23/24 stable. Stop SSI.   Hyperlipidemia 03/23/24 continue crestor .   Essential hypertension, benign 03/23/24 continue with lopressor  12.5 mg bid.    DVT prophylaxis: heparin injection 5,000 Units Start: 03/22/24 2200    Code Status: Full Code Family Communication: discussed with son-in-law at bedside Disposition Plan: rehab. Snf vs cir Reason for continuing need for hospitalization: on IV Abx. ID consulted.  Objective: Vitals:   03/22/24 2339 03/23/24 0434 03/23/24 0845 03/23/24 1605  BP: (!)  112/50 (!) 115/59 139/66 (!) 149/62  Pulse: 70 74 71 66  Resp: 17 18 18 18   Temp: 97.6 F (36.4 C) 98.2 F (36.8 C) 98.2 F (36.8 C) 98.4 F (36.9 C)  TempSrc: Oral Oral Oral Oral  SpO2: 98% 98% 97% 100%  Weight:      Height:        Intake/Output Summary (Last 24 hours) at 03/23/2024 1638 Last data filed at 03/23/2024 1400 Gross per 24 hour  Intake 1531.65 ml  Output 1700 ml  Net -168.35 ml   Filed Weights   03/22/24 1225 03/22/24 1914  Weight: 93 kg 92.3 kg    Examination:  Physical Exam Vitals and nursing note reviewed.  Constitutional:      General: She is not in acute distress.    Appearance: She is obese. She is not toxic-appearing.  HENT:     Head: Normocephalic and atraumatic.  Eyes:     General: No scleral icterus. Cardiovascular:     Rate and Rhythm: Normal rate and regular rhythm.  Pulmonary:     Effort: Pulmonary effort is normal.     Breath sounds:  Normal breath sounds.  Abdominal:     General: Bowel sounds are normal. There is no distension.     Palpations: Abdomen is soft.  Musculoskeletal:     Right lower leg: No edema.     Left lower leg: No edema.     Comments: Right UE PICC  Skin:    General: Skin is warm and dry.     Capillary Refill: Capillary refill takes less than 2 seconds.  Neurological:     General: No focal deficit present.     Mental Status: She is alert and oriented to person, place, and time.     Data Reviewed: I have personally reviewed following labs and imaging studies  CBC: Recent Labs  Lab 03/22/24 1518 03/23/24 0157  WBC 7.2 7.3  NEUTROABS 4.8  --   HGB 9.8* 8.5*  HCT 30.2* 26.3*  MCV 95.9 93.6  PLT 197 183   Basic Metabolic Panel: Recent Labs  Lab 03/22/24 1518 03/23/24 0157  NA 141 140  K 4.6 4.5  CL 111 112*  CO2 20* 18*  GLUCOSE 102* 119*  BUN 53* 50*  CREATININE 2.53* 2.72*  CALCIUM  9.7 8.9  MG 2.4  --   PHOS 3.9  --    GFR: Estimated Creatinine Clearance: 20.1 mL/min (A) (by C-G formula  based on SCr of 2.72 mg/dL (H)). Liver Function Tests: Recent Labs  Lab 03/22/24 1518  AST 27  ALT 25  ALKPHOS 47  BILITOT 0.5  PROT 5.9*  ALBUMIN  2.6*   Cardiac Enzymes: Recent Labs  Lab 03/22/24 2050  CKTOTAL 77   Recent Results (from the past 240 hours)  Culture, blood (Routine X 2) w Reflex to ID Panel     Status: None (Preliminary result)   Collection Time: 03/22/24  8:50 PM   Specimen: BLOOD LEFT HAND  Result Value Ref Range Status   Specimen Description BLOOD LEFT HAND  Final   Special Requests   Final    BOTTLES DRAWN AEROBIC AND ANAEROBIC Blood Culture adequate volume   Culture   Final    NO GROWTH < 12 HOURS Performed at [facility], [address], [place], [place] [address]    Report Status PENDING  Incomplete  Culture, blood (Routine X 2) w Reflex to ID Panel     Status: None (Preliminary result)   Collection Time: 03/22/24  9:21 PM   Specimen: BLOOD LEFT HAND  Result Value Ref Range Status   Specimen Description BLOOD LEFT HAND  Final   Special Requests   Final    BOTTLES DRAWN AEROBIC AND ANAEROBIC Blood Culture adequate volume   Culture   Final    NO GROWTH < 12 HOURS Performed at [facility], [address], [place], [place] [address]    Report Status PENDING  Incomplete     Radiology Studies: DG Chest Port 1 View Result Date: 03/22/2024 CLINICAL DATA:  Dehydration EXAM: PORTABLE CHEST 1 VIEW COMPARISON:  02/20/2024 FINDINGS: Cardiac shadow is stable. Right PICC is noted in satisfactory position. The lungs are clear bilaterally. No bony abnormality is noted. IMPRESSION: No active disease. Electronically Signed   By: Oneil Devonshire M.D.   On: 03/22/2024 23:22   CT ABDOMEN PELVIS WO CONTRAST Result Date: 03/22/2024 CLINICAL DATA:  Acute abdominal pain. EXAM: CT ABDOMEN AND PELVIS WITHOUT CONTRAST TECHNIQUE: Multidetector CT imaging of the abdomen and pelvis was performed following the standard protocol without IV contrast. RADIATION DOSE  REDUCTION: This exam was  performed according to the departmental dose-optimization program which includes automated exposure control, adjustment of the mA and/or kV according to patient size and/or use of iterative reconstruction technique. COMPARISON:  CT 02/23/2024 FINDINGS: Lower chest: Hypoventilatory changes in the lung bases. Hepatobiliary: No focal liver abnormality is seen. Status post cholecystectomy. No biliary dilatation. Pancreas: No ductal dilatation or inflammation. Spleen: Normal in size without focal abnormality. Adrenals/Urinary Tract: Normal adrenal glands. No hydronephrosis, renal calculi, or evidence of renal inflammation. Unremarkable urinary bladder. Stomach/Bowel: Small hiatal hernia. Duodenal diverticulum. No bowel obstruction or inflammation. Moderate colonic stool burden. Left colonic diverticulosis without diverticulitis. Diminutive appendix tentatively visualized. Vascular/Lymphatic: Aortic and branch atherosclerosis. No aortic aneurysm. No enlarged lymph nodes in the abdomen or pelvis. Reproductive: Uterus and bilateral adnexa are unremarkable. Other: No free air or ascites. Diminutive fat containing umbilical hernia. Tiny fat containing ventral hernia in the supraumbilical region just to the right of midline. Musculoskeletal: Posterior rod and pedicle screw fixation L4-L5 with strandy density posteriorly, likely related to recent surgery. No discrete fluid collection. IMPRESSION: 1. No acute abnormality in the abdomen/pelvis. 2. Colonic diverticulosis without diverticulitis. 3. Small hiatal hernia. 4. Posterior rod and pedicle screw fixation L4-L5 with strandy density posteriorly, likely related to recent surgery. No discrete fluid collection. Aortic Atherosclerosis (ICD10-I70.0). Electronically Signed   By: Andrea Gasman M.D.   On: 03/22/2024 14:27   CT Head Wo Contrast Result Date: 03/22/2024 CLINICAL DATA:  Subdural hemorrhage EXAM: CT HEAD WITHOUT CONTRAST TECHNIQUE:  Contiguous axial images were obtained from the base of the skull through the vertex without intravenous contrast. RADIATION DOSE REDUCTION: This exam was performed according to the departmental dose-optimization program which includes automated exposure control, adjustment of the mA and/or kV according to patient size and/or use of iterative reconstruction technique. COMPARISON:  02/14/2024 FINDINGS: Brain: No evidence of acute infarction, hemorrhage, hydrocephalus, extra-axial collection or mass lesion/mass effect. Scattered tiny foci of intracranial lipid identified by prior CT and MRI are unchanged (series 2, image 13 11, 14). Vascular: No hyperdense vessel or unexpected calcification. Skull: Normal. Negative for fracture or focal lesion. Sinuses/Orbits: No acute finding. Other: None. IMPRESSION: 1. No acute intracranial pathology. 2. Scattered tiny foci of intracranial lipid identified by prior CT and MRI are unchanged. Electronically Signed   By: Marolyn JONETTA Jaksch M.D.   On: 03/22/2024 14:25    Scheduled Meds:  Chlorhexidine  Gluconate Cloth  6 each Topical Q0600   gabapentin   100 mg Oral BID   heparin  5,000 Units Subcutaneous Q8H   metoprolol  tartrate  12.5 mg Oral BID   pantoprazole  (PROTONIX ) IV  40 mg Intravenous Q24H   rosuvastatin   40 mg Oral QPM   sodium chloride  flush  3 mL Intravenous Q12H   Continuous Infusions:  sodium chloride  100 mL/hr at 03/23/24 0955   [START ON 03/24/2024] ertapenem     meropenem (MERREM) IV 1 g (03/23/24 0959)     LOS: 1 day   Time spent: 60 minutes  Camellia Door, DO  [facility]  03/23/2024, 4:38 PM

## 2024-03-23 NOTE — Assessment & Plan Note (Addendum)
 03/23/24 had surgery on 02-08-2024, then post-op leak fix on 02-17-2024.

## 2024-03-23 NOTE — Assessment & Plan Note (Addendum)
 03/23/24 continue crestor .

## 2024-03-23 NOTE — Consult Note (Signed)
 [facility]    Date of Admission:  03/22/2024      Total days of antibiotics PTA Cefepime    Ertapenem 03/23/2024 >> c               Reason for Consult: Antibiotic s/e, h/o vertebral enterobacter cloacea infection     Referring Provider: Laurence  Primary Care Provider: Cook, Jayce G, DO   Assessment: Diane Watkins is a 73 y.o. female admitted from home with:    Clonic Movements Upper and Lower Extremities -  She developed N/V and clonic jerking motions of extremeties in the setting of AKI on cefepime  concerning for CNS toxicity. We have switched her to meropenem (now ertapenem) and will watch for resolution here. Explained that her symptoms if related to cefepime  will resolve after we stop the drug and allow it to wash out.  - remain off cefepime   - follow neurologic status  - expected to resolve.   AKI / Poor PO intake and N/V -  Suspected   H/O Enterobacter Cloacea Spine Infection S/P Fusion -  She is undergoing treatment for now through 10/30.  Her CRP has normalized x 2 reads now and ESR 47. Will switch to IV ertapenem which is easy in the setting of her A/C CKD vs switching doses of other medications. EOT 04/14/2024 - ertapenem 1 gm IV daily   PICC In place -  OK to leave, no concerns here  Isolation Recommendations -  Standard     Plan: - remain off cefepime   - follow neurologic status  - expected to resolve.   - ertapenem 1 gm IV daily   Principal Problem:   AKI (acute kidney injury) Active Problems:   Essential hypertension, benign   Hyperlipidemia   Controlled type 2 diabetes mellitus without complication, without long-term current use of insulin  (HCC)   S/P lumbar fusion   Nausea and vomiting   Acute metabolic encephalopathy   History of bacteremia   Obesity (BMI 30-39.9)    Chlorhexidine  Gluconate Cloth  6 each Topical Q0600   gabapentin   100 mg Oral BID   heparin  5,000 Units Subcutaneous Q8H   metoprolol   tartrate  12.5 mg Oral BID   pantoprazole  (PROTONIX ) IV  40 mg Intravenous Q24H   rosuvastatin   40 mg Oral QPM   sodium chloride  flush  3 mL Intravenous Q12H    HPI: Diane Watkins is a 73 y.o. female admitted for evaluation of worsening nausea, vomiting and new onset jerking motions of extremeties.   Her husband joins us  today for visit. She has had some myoclonic jerks at home of all extremities with some occasional noctural disorientation that resolves with re-orientation per her husband.  Her spine incision looks great and no increased pain - stable.   She has not had any fevers / chills. 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 She is currently undergoing 8 week course of IV cefepime  for treatment of this infection with end date projected to 04/14/2024   Review of Systems: Review of Systems  Constitutional:  Negative for chills and fever.  Respiratory: Negative.    Cardiovascular: Negative.   Gastrointestinal:  Positive for nausea and vomiting.  Musculoskeletal:  Negative for back pain.  Neurological:  Positive for tremors and weakness. Negative for seizures and headaches.    Past Medical History:  Diagnosis Date  Arthritis    Diabetes mellitus without complication (HCC)    High cholesterol    History of hiatal hernia    Hyperlipidemia    Hypertension     Social History   Tobacco Use   Smoking status: Never    Passive exposure: Past   Smokeless tobacco: Never  Vaping Use   Vaping status: Never Used  Substance Use Topics   Alcohol  use: Not Currently    Comment: rare - social   Drug use: No    Family History  Problem Relation Age of Onset   Stroke Mother    Alcohol  abuse Father    Heart disease Father 71       MI   Allergies  Allergen Reactions   Amoxil [Amoxicillin] Other (See Comments)    Bad headaches Has patient had a PCN reaction causing immediate  rash, facial/tongue/throat swelling, SOB or lightheadedness with hypotension: No Has patient had a PCN reaction causing severe rash involving mucus membranes or skin necrosis: No Has patient had a PCN reaction that required hospitalization: No Has patient had a PCN reaction occurring within the last 10 years: No If all of the above answers are NO, then may proceed with Cephalosporin use.    Oxycodone -Acetaminophen  Itching    OBJECTIVE: Blood pressure 139/66, pulse 71, temperature 98.2 F (36.8 C), temperature source Oral, resp. rate 18, height 5' 2 (1.575 m), weight 92.3 kg, SpO2 97%.  Physical Exam Cardiovascular:     Rate and Rhythm: Normal rate and regular rhythm.  Pulmonary:     Effort: Pulmonary effort is normal.     Comments: No shortness of breath detected in conversation.  Neurological:     Mental Status: She is oriented to person, place, and time.  Psychiatric:        Mood and Affect: Mood normal.        Behavior: Behavior normal.        Thought Content: Thought content normal.        Judgment: Judgment normal.       Lab Results Lab Results  Component Value Date   WBC 7.3 03/23/2024   HGB 8.5 (L) 03/23/2024   HCT 26.3 (L) 03/23/2024   MCV 93.6 03/23/2024   PLT 183 03/23/2024    Lab Results  Component Value Date   CREATININE 2.72 (H) 03/23/2024   BUN 50 (H) 03/23/2024   NA 140 03/23/2024   K 4.5 03/23/2024   CL 112 (H) 03/23/2024   CO2 18 (L) 03/23/2024    Lab Results  Component Value Date   ALT 25 03/22/2024   AST 27 03/22/2024   ALKPHOS 47 03/22/2024   BILITOT 0.5 03/22/2024     Microbiology: Recent Results (from the past 240 hours)  Culture, blood (Routine X 2) w Reflex to ID Panel     Status: None (Preliminary result)   Collection Time: 03/22/24  8:50 PM   Specimen: BLOOD LEFT HAND  Result Value Ref Range Status   Specimen Description BLOOD LEFT HAND  Final   Special Requests   Final    BOTTLES DRAWN AEROBIC AND ANAEROBIC Blood Culture  adequate volume   Culture   Final    NO GROWTH < 12 HOURS Performed at [facility], [address], [place], [place] [address]    Report Status PENDING  Incomplete  Culture, blood (Routine X 2) w Reflex to ID Panel     Status: None (Preliminary result)   Collection Time: 03/22/24  9:21  PM   Specimen: BLOOD LEFT HAND  Result Value Ref Range Status   Specimen Description BLOOD LEFT HAND  Final   Special Requests   Final    BOTTLES DRAWN AEROBIC AND ANAEROBIC Blood Culture adequate volume   Culture   Final    NO GROWTH < 12 HOURS Performed at [facility], [address], [place], [place] [address]    Report Status PENDING  Incomplete    Corean Fireman, MSN, NP-C [facility] Pager: [phone]  03/23/2024 3:31 PM    Total Encounter Time: 25 m

## 2024-03-23 NOTE — Subjective & Objective (Signed)
 Pt seen and examined. Met with son-in-law at bedside. Pt knows she was not drinking enough liquids at home. Still on IV cefepime  at home. Having tremors of her arms. IV ABX changed to meropenem after admitting provider talked with ID.

## 2024-03-23 NOTE — TOC CM/SW Note (Signed)
 Transition of Care [facility]) - Inpatient Brief Assessment   Patient Details  Name: Diane Watkins MRN: [id number] Date of Birth: Sep 20, 1950  [facility] CM/SW Contact:    Tom-Johnson, Harvest Muskrat, RN Phone Number: 03/23/2024, 1:02 PM   Clinical Narrative:  Patient presented to the ED with worsening Nausea and Vomiting. Patient was recently admitted for Headache from 02/14/24-02/24/24 following recent Transforaminal Lumbar Decompression and Fusion at L4-5 on 02/08/2024.  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 MRI on 02/15/24 showed persistent leak and underwent Exploration of the Lumbar Wound with closure of Dural Defect on 02/17/24 by Dr. Joshua. Blood cultures were positive for Enterobacter Cloacae, ID consulted and recommended 8 weeks of IV abx. Patient was discharged to CIR.   Per chart review, patient has been having Myoclonic Jerks at home and per RN, patient c/o Numbness and Tingling. Patient is currently on IV abx, ID was consulted, requested changing Cefepime  to Meropenem. Patient has a PICC access from previous admission.   CM spoke with patient at bedside about post hospital transition. Patient lives at home with her husband. Has two children and two supportive siblings. Modified independent at home, has a cane, walker, rollator and shower seat at home.  PCP is Cook, Jayce G, DO and [facility] on [address] in [place].     Patient requested to go to rehab prior discharging home. CM informed patient that PT has to eval and make disposition recommendations.  Patient not Medically ready for discharge.  CM will continue to follow as patient progresses with care towards discharge.       Transition of Care Asessment: Insurance and Status: Insurance coverage has been reviewed Patient has primary care physician: Yes Home environment has been reviewed: Yes Prior level of function:: Modified Independent Prior/Current Home Services: Current home  services Social Drivers of Health Review: SDOH reviewed no interventions necessary Readmission risk has been reviewed: Yes Transition of care needs: transition of care needs identified, TOC will continue to follow

## 2024-03-23 NOTE — Assessment & Plan Note (Signed)
Body mass index is 37.22 kg/m.

## 2024-03-23 NOTE — Hospital Course (Addendum)
 CC: dehydration HPI: Diane Watkins is a 73 y.o. female with medical history significant of hypertension, hyperlipidemia, diabetes mellitus type 2, arthritis, and recent transforaminal lumbar decompression and fusion at L4-5 on 02/08/2024 presents with nausea and vomiting.   She was just recently hospitalized from 8/31-9/10 after presenting with headache following her recent surgical procedure.  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  Patient underwent MRI which revealed persistent leak on 9/1 and underwent exploration of the lumbar wound with closure of dural defect by Dr. Joshua on 9/3.  Blood cultures from 9/6 positive for Enterobacter Cloacae for which ID was consulted and recommended 8 weeks of IV antibiotics for which a PICC line was placed on 9/9.  She went to rehab following her hospitalization.     She reports that the nausea and vomiting started last night after eating and drinking, but also mentions having a pattern of eating and then experiencing nausea for a couple of days. She has not had a chance to eat or drink anything today. No stomach pain or diarrhea. She confirms urination without discomfort and has not experienced any recent falls. No fevers have been noted.   Her husband, who lives with her, has been eating the same food but has not experienced similar symptoms. She mentions taking a medication similar to omeprazole, prescribed for stomach issues. Occasional shortness of breath is reported.   Over the phone patient's husband notes that she has been a little altered at times and talking out of her head.   In the ED patient was noted to be afebrile with heart rates elevated up to 115, and all other vital signs maintained.  Labs significant for hemoglobin 9.8, BUN 53, and creatinine 2.53.  CT scan of the head did not reveal any acute intracranial abnormality.  CT scan of the abdomen and pelvis notedNo acute abnormality with posterior rod and pedicle screw fixation at  L4-L5 with stranding density posterior likely related to recent surgery with no discrete fluid collection appreciated.  Patient was bolused 2 L of IV fluids and Zofran .  TRH consulted to admit.  Significant Events: Admitted 03/22/2024 for AKI and dehydration   Admission Labs: WBC 7.2, HgB 9.8, plt 197 Mg 2.4 Na 141, K 4.6, CO2 of 20, BUN 53, Scr 2.53, glu 102 T. Prot 5.9, alb 2.6, AST 27, ALT 25, alk phos 47, t. Bili 0.5 UA 1.008, protein 30, negative Nitrite, Negative LE CK 77 CRP < 0.5 ESR 47  Admission Imaging Studies: CT abd/pelvis No acute abnormality in the abdomen/pelvis. 2. Colonic diverticulosis without diverticulitis. 3. Small hiatal hernia. 4. Posterior rod and pedicle screw fixation L4-L5 with strandy density posteriorly, likely related to recent surgery. No discrete fluid collection. CT head  No acute intracranial pathology. 2. Scattered tiny foci of intracranial lipid identified by prior CT and MRI are unchanged. CXR No active disease.   Significant Labs:   Significant Imaging Studies:   Antibiotic Therapy: Anti-infectives (From admission, onward)    Start     Dose/Rate Route Frequency Ordered Stop   03/22/24 2215  meropenem (MERREM) 1 g in sodium chloride  0.9 % 100 mL IVPB        1 g 200 mL/hr over 30 Minutes Intravenous Every 12 hours 03/22/24 2121     03/22/24 2200  ceFEPime  (MAXIPIME ) IVPB  Status:  Discontinued       Note to Pharmacy: Indication:  Enterobacter cloacae bacteremia/lumbar hardware infection First Dose: Yes Last Day of Therapy:  04/16/24  Labs -  Once weekly:  CBC/D and CMET Labs - Once weekly: ESR and CRP Method of administration: IV Push Method of administration may be changed at the discretion of home infusio   2 g Intravenous Every 8 hours 03/22/24 1758 03/22/24 1836   03/22/24 1845  ceFEPIme  (MAXIPIME ) 2 g in sodium chloride  0.9 % 100 mL IVPB  Status:  Discontinued        2 g 200 mL/hr over 30 Minutes Intravenous Every 24 hours 03/22/24  1836 03/22/24 2113       Procedures:   Consultants: Neurology

## 2024-03-23 NOTE — Progress Notes (Incomplete)
 PHARMACY CONSULT NOTE FOR:  OUTPATIENT  PARENTERAL ANTIBIOTIC THERAPY (OPAT)  Indication:  Regimen:  End date:   IV antibiotic discharge orders are pended. To discharging provider:  please sign these orders via discharge navigator,  Select New Orders & click on the button choice - Manage This Unsigned Work.     Thank you for allowing pharmacy to be a part of this patient's care.  Diane Watkins 03/23/2024, 12:40 PM

## 2024-03-23 NOTE — Assessment & Plan Note (Signed)
03/23/24   

## 2024-03-23 NOTE — Assessment & Plan Note (Signed)
 03/23/24 probably a combination of dehydration due to poor po intake and possibly interstitial nephritis from Cefepime . Remains on IVF. Pt took off her silver-colored ring off left 4th finger and gave it to her son-in-law. He placed ring in her wallet. Repeat BMP in AM.

## 2024-03-23 NOTE — Assessment & Plan Note (Addendum)
 03/23/24 contributes to her AKI. Continue with Ivf. Pt is hunger now. Change to solid food.

## 2024-03-23 NOTE — Evaluation (Signed)
 Physical Therapy Evaluation Patient Details Name: Diane Watkins MRN: [id number] DOB: 10/02/1950 Today's Date: 03/23/2024  History of Present Illness  Diane Watkins is a 73 y.o. female with and recent transforaminal lumbar decompression and fusion at L4-5 on 02/08/2024, readmission with HA, dural tear (repaired)  and Meningitis with [facility] stay, presents with nausea and vomiting, as well as myoclonic jerking observed by family  Clinical Impression   Pt admitted with above diagnosis. Lives at home with spouse, in a home with 3 steps to enter; Prior to admission in August for back surgery, pt was able to manage independently, walking with RW independent with bathing, dressing; Presents to PT with generalized weakness, functional dependencies, occasional tremulous motion of UEs, incr fall risk; Needed CGA/Min assist for basic sit<>stand transfers, knees buckled with short distance amb in room; Pt tell sme she had a recent stay at [facility] and she would like to return to [facility] for rehab after this acute hospital stay; We discussed factors we must consider for readmisssion to [facility], and pt voiced understanding; Worth asking [facility] Admissions Coordinator to review her case; Pt currently with functional limitations due to the deficits listed below (see PT Problem List). Pt will benefit from skilled PT to increase their independence and safety with mobility to allow discharge to the venue listed below.           If plan is discharge home, recommend the following: A little help with walking and/or transfers;A lot of help with bathing/dressing/bathroom;Assistance with cooking/housework;Help with stairs or ramp for entrance   Can travel by private vehicle        Equipment Recommendations Rolling walker (2 wheels);BSC/3in1;Other (comment) (she likely already has)  Recommendations for Other Services  Rehab consult (Pt had a recent stay at [facility] with good outcomes and she wishes to return for more physical rehab)     Functional Status Assessment Patient has had a recent decline in their functional status and demonstrates the ability to make significant improvements in function in a reasonable and predictable amount of time.     Precautions / Restrictions Precautions Precautions: Back;Fall Precaution Booklet Issued: No Other Brace: no brace needed per orders Restrictions Weight Bearing Restrictions Per Provider Order: No      Mobility  Bed Mobility                    Transfers Overall transfer level: Needs assistance Equipment used: Rolling walker (2 wheels) Transfers: Sit to/from Stand Sit to Stand: Contact guard assist           General transfer comment: Cues for hand placement to push up from armrests of 3in1    Ambulation/Gait Ambulation/Gait assistance: Contact guard assist, Min assist Gait Distance (Feet): 4 Feet (including pivot turns) Assistive device: Rolling walker (2 wheels) Gait Pattern/deviations: Decreased step length - right, Decreased step length - left       General Gait Details: Noted tremulous steps with knees buckling as we approached the recliner, min assist to stabilize adn cues to square off on front of recliner  Stairs            Wheelchair Mobility     Tilt Bed    Modified Rankin (Stroke Patients Only)       Balance Overall balance assessment: Needs assistance   Sitting balance-Leahy Scale: Good       Standing balance-Leahy Scale: Poor  Pertinent Vitals/Pain Pain Assessment Pain Assessment: Faces Faces Pain Scale: Hurts a little bit Pain Location: Generalized with effort Pain Descriptors / Indicators: Grimacing Pain Intervention(s): Monitored during session    Home Living Family/patient expects to be discharged to:: Private residence Living Arrangements: Spouse/significant other Available Help at Discharge: Family;Available PRN/intermittently Type of Home: House Home Access:  Stairs to enter Entrance Stairs-Rails: Doctor, general practice of Steps: 3   Home Layout: Other (Comment);Full bath on main level Home Equipment: Patent examiner (4 wheels);Cane - single point;Tub bench Additional Comments: was walking with RW and reports furniture walking    Prior Function Prior Level of Function : Independent/Modified Independent             Mobility Comments: mobilizing with RW, no falls, progressing distance with family ADLs Comments: incr time but ind     Extremity/Trunk Assessment   Upper Extremity Assessment Upper Extremity Assessment: Defer to OT evaluation    Lower Extremity Assessment Lower Extremity Assessment: Generalized weakness    Cervical / Trunk Assessment Cervical / Trunk Assessment: Back Surgery;Other exceptions (Lower back surgical would healed)  Communication   Communication Communication: No apparent difficulties    Cognition Arousal: Alert Behavior During Therapy: WFL for tasks assessed/performed   PT - Cognitive impairments: No apparent impairments                         Following commands: Intact       Cueing Cueing Techniques: Verbal cues, Gestural cues     General Comments General comments (skin integrity, edema, etc.): Assisted pt with hygeine after she [facility]    Exercises     Assessment/Plan    PT Assessment Patient needs continued PT services  PT Problem List Decreased strength;Decreased activity tolerance;Decreased balance;Decreased mobility;Decreased coordination;Decreased knowledge of use of DME;Decreased safety awareness;Decreased knowledge of precautions;Pain       PT Treatment Interventions DME instruction;Gait training;Stair training;Functional mobility training;Therapeutic activities;Therapeutic exercise;Balance training;Neuromuscular re-education;Cognitive remediation;Patient/family education;Wheelchair mobility training;Manual techniques    PT Goals (Current  goals can be found in the Care Plan section)  Acute Rehab PT Goals Patient Stated Goal: Would like to return to [facility] for rehab PT Goal Formulation: With patient Time For Goal Achievement: 04/06/24 Potential to Achieve Goals: Good    Frequency Min 2X/week     Co-evaluation               AM-PAC PT 6 Clicks Mobility  Outcome Measure Help needed turning from your back to your side while in a flat bed without using bedrails?: A Little Help needed moving from lying on your back to sitting on the side of a flat bed without using bedrails?: A Lot Help needed moving to and from a bed to a chair (including a wheelchair)?: A Little Help needed standing up from a chair using your arms (e.g., wheelchair or bedside chair)?: A Little Help needed to walk in hospital room?: A Little Help needed climbing 3-5 steps with a railing? : A Lot 6 Click Score: 16    End of Session Equipment Utilized During Treatment: Gait belt;Oxygen Activity Tolerance: Patient tolerated treatment well Patient left: in chair;with call bell/phone within reach;with chair alarm set Nurse Communication: Mobility status PT Visit Diagnosis: Unsteadiness on feet (R26.81);Muscle weakness (generalized) (M62.81)    Time: 8760-8695 PT Time Calculation (min) (ACUTE ONLY): 25 min   Charges:   PT Evaluation $PT Eval Low Complexity: 1 Low PT Treatments $Therapeutic Activity: 8-22 mins  PT General Charges $$ ACUTE PT VISIT: 1 Visit         Silvano Currier, PT  Acute Rehabilitation Services Office [phone] Secure Chat welcomed   Silvano VEAR Currier 03/23/2024, 3:39 PM

## 2024-03-23 NOTE — Telephone Encounter (Signed)
 Copied from CRM [id number]. Topic: Clinical - Medical Advice >> Mar 23, 2024 10:25 AM Harlene ORN wrote: Reason for CRM:  Rosina - OT from [facility]  Informing the PCP that she is in the hospital admitted last night for kidney issues.   Phone: [phone]

## 2024-03-23 NOTE — Assessment & Plan Note (Addendum)
 03/23/24 continue with lopressor  12.5 mg bid.

## 2024-03-24 DIAGNOSIS — Z981 Arthrodesis status: Secondary | ICD-10-CM | POA: Diagnosis not present

## 2024-03-24 DIAGNOSIS — Z87898 Personal history of other specified conditions: Secondary | ICD-10-CM | POA: Diagnosis not present

## 2024-03-24 DIAGNOSIS — N179 Acute kidney failure, unspecified: Secondary | ICD-10-CM | POA: Diagnosis not present

## 2024-03-24 LAB — BASIC METABOLIC PANEL WITH GFR
Anion gap: 11 (ref 5–15)
BUN: 51 mg/dL — ABNORMAL HIGH (ref 8–23)
CO2: 18 mmol/L — ABNORMAL LOW (ref 22–32)
Calcium: 9.4 mg/dL (ref 8.9–10.3)
Chloride: 108 mmol/L (ref 98–111)
Creatinine, Ser: 3.49 mg/dL — ABNORMAL HIGH (ref 0.44–1.00)
GFR, Estimated: 13 mL/min — ABNORMAL LOW (ref >60)
Glucose, Bld: 139 mg/dL — ABNORMAL HIGH (ref 70–99)
Potassium: 5 mmol/L (ref 3.5–5.1)
Sodium: 137 mmol/L (ref 135–145)

## 2024-03-24 LAB — CBC
HCT: 31.2 % — ABNORMAL LOW (ref 36.0–46.0)
Hemoglobin: 10.2 g/dL — ABNORMAL LOW (ref 12.0–15.0)
MCH: 30.4 pg (ref 26.0–34.0)
MCHC: 32.7 g/dL (ref 30.0–36.0)
MCV: 93.1 fL (ref 80.0–100.0)
Platelets: 226 K/uL (ref 150–400)
RBC: 3.35 MIL/uL — ABNORMAL LOW (ref 3.87–5.11)
RDW: 14.4 % (ref 11.5–15.5)
WBC: 8.1 K/uL (ref 4.0–10.5)
nRBC: 0 % (ref 0.0–0.2)

## 2024-03-24 MED ORDER — POLYSACCHARIDE IRON COMPLEX 150 MG PO CAPS
150.0000 mg | ORAL_CAPSULE | Freq: Every day | ORAL | Status: DC
Start: 2024-03-24 — End: 2024-03-28
  Administered 2024-03-24 – 2024-03-28 (×5): 150 mg via ORAL
  Filled 2024-03-24 (×5): qty 1

## 2024-03-24 MED ORDER — HYDRALAZINE HCL 10 MG PO TABS: 10.0000 mg | ORAL_TABLET | Freq: Four times a day (QID) | ORAL | Status: DC | PRN

## 2024-03-24 MED ORDER — SODIUM CHLORIDE 0.9 % IV SOLN: INTRAVENOUS | Status: DC

## 2024-03-24 MED ORDER — SODIUM BICARBONATE 650 MG PO TABS
650.0000 mg | ORAL_TABLET | Freq: Two times a day (BID) | ORAL | Status: DC
  Administered 2024-03-24 – 2024-03-28 (×8): 650 mg via ORAL
  Filled 2024-03-24 (×9): qty 1

## 2024-03-24 NOTE — Progress Notes (Signed)
 [facility]  Date of Admission:  03/22/2024     Reason for Follow Up: AKI (acute kidney injury)  Total days of antibiotics 3         ASSESSMENT:  Diane Watkins is a 73 year old female with recent history of Enterobacter cloacae bacteremia after L4-L5 fusion in August 2025 status post exploration and closure with plan for 8 weeks of IV cefepime  admitted with worsening nausea, vomiting, and onset of tremors/jerking motions and found to have acute kidney injury and concern for cefepime  CNS toxicity.  Antibiotics changed to ertapenem.  Diane Watkins blood cultures from 03/22/2024 are without growth to date and appears to be tolerating ertapenem with no further jerking motions/tremors since stopping cefepime .  Mental status appears to be close to baseline.  Appears to have some deconditioning with being easily fatigued.  Plan of care to continue with current dose of ertapenem for previous postsurgical infection through 04/14/2024 as planned.  Renal function slightly worsened today to monitor.  Continue standard/universal precautions.  Remaining medical and supportive care per internal medicine.  PLAN:  Continue current dose of ertapenem. Monitor for any additional CNS effects and improvement in renal function. Continue standard/universal precautions. Remaining medical and supportive care per internal medicine.  Principal Problem:   AKI (acute kidney injury) Active Problems:   Essential hypertension, benign   Hyperlipidemia   Controlled type 2 diabetes mellitus without complication, without long-term current use of insulin  (HCC)   S/P lumbar fusion   Nausea and vomiting   History of bacteremia - enterobacter cloacae from 02-20-2024.   Obesity (BMI 30-39.9)    Chlorhexidine  Gluconate Cloth  6 each Topical Q0600   gabapentin   100 mg Oral BID   heparin  5,000 Units Subcutaneous Q8H   metoprolol  tartrate  12.5 mg Oral BID   pantoprazole  (PROTONIX ) IV  40 mg  Intravenous Q24H   rosuvastatin   40 mg Oral QPM   sodium chloride  flush  3 mL Intravenous Q12H    SUBJECTIVE:  Afebrile overnight with no acute events. Tolerating ertapenem with no adverse side effects. No further tremors since stopping Cefepime . Wanting to get up and walk more.   Allergies  Allergen Reactions   Amoxil [Amoxicillin] Other (See Comments)    Bad headaches Has patient had a PCN reaction causing immediate rash, facial/tongue/throat swelling, SOB or lightheadedness with hypotension: No Has patient had a PCN reaction causing severe rash involving mucus membranes or skin necrosis: No Has patient had a PCN reaction that required hospitalization: No Has patient had a PCN reaction occurring within the last 10 years: No If all of the above answers are NO, then may proceed with Cephalosporin use.    Oxycodone -Acetaminophen  Itching     Review of Systems: Review of Systems  Constitutional:  Negative for chills, fever and weight loss.  Respiratory:  Negative for cough, shortness of breath and wheezing.   Cardiovascular:  Negative for chest pain and leg swelling.  Gastrointestinal:  Negative for abdominal pain, constipation, diarrhea, nausea and vomiting.  Skin:  Negative for rash.      OBJECTIVE: Vitals:   03/23/24 0845 03/23/24 1605 03/23/24 2036 03/24/24 0451  BP: 139/66 (!) 149/62 (!) 151/72 132/69  Pulse: 71 66 76 64  Resp: 18 18 18 18   Temp: 98.2 F (36.8 C) 98.4 F (36.9 C) 98.8 F (37.1 C) 98.3 F (36.8 C)  TempSrc: Oral Oral    SpO2: 97% 100% 99% 98%  Weight:      Height:  Body mass index is 37.22 kg/m.  Physical Exam Constitutional:      General: She is not in acute distress.    Appearance: She is well-developed.  Cardiovascular:     Rate and Rhythm: Normal rate and regular rhythm.     Heart sounds: Normal heart sounds.  Pulmonary:     Effort: Pulmonary effort is normal.     Breath sounds: Normal breath sounds.  Skin:    General: Skin  is warm and dry.  Neurological:     Mental Status: She is alert and oriented to person, place, and time.  Psychiatric:        Mood and Affect: Mood normal.        Behavior: Behavior normal.     Lab Results Lab Results  Component Value Date   WBC 7.3 03/23/2024   HGB 8.5 (L) 03/23/2024   HCT 26.3 (L) 03/23/2024   MCV 93.6 03/23/2024   PLT 183 03/23/2024    Lab Results  Component Value Date   CREATININE 2.72 (H) 03/23/2024   BUN 50 (H) 03/23/2024   NA 140 03/23/2024   K 4.5 03/23/2024   CL 112 (H) 03/23/2024   CO2 18 (L) 03/23/2024    Lab Results  Component Value Date   ALT 25 03/22/2024   AST 27 03/22/2024   ALKPHOS 47 03/22/2024   BILITOT 0.5 03/22/2024     Microbiology: Recent Results (from the past 240 hours)  Culture, blood (Routine X 2) w Reflex to ID Panel     Status: None (Preliminary result)   Collection Time: 03/22/24  8:50 PM   Specimen: BLOOD LEFT HAND  Result Value Ref Range Status   Specimen Description BLOOD LEFT HAND  Final   Special Requests   Final    BOTTLES DRAWN AEROBIC AND ANAEROBIC Blood Culture adequate volume   Culture   Final    NO GROWTH 2 DAYS Performed at [facility], [address], [place], [place] [address]    Report Status PENDING  Incomplete  Culture, blood (Routine X 2) w Reflex to ID Panel     Status: None (Preliminary result)   Collection Time: 03/22/24  9:21 PM   Specimen: BLOOD LEFT HAND  Result Value Ref Range Status   Specimen Description BLOOD LEFT HAND  Final   Special Requests   Final    BOTTLES DRAWN AEROBIC AND ANAEROBIC Blood Culture adequate volume   Culture   Final    NO GROWTH 2 DAYS Performed at [facility], [address], [place], [place] [address]    Report Status PENDING  Incomplete    I have personally spent 35 minutes involved in face-to-face and non-face-to-face activities for this patient on the day of the visit. Professional time spent includes the following activities: preparing to  see the patient (review of tests), performing a medically appropriate examination, ordering medications, communicating with other health care professionals, documenting clinical information in the EMR, communicating results and counseling patient regarding medication and plan of care, and care coordination.   Cathlyn July, NP [facility]  03/24/2024  4:05 PM

## 2024-03-24 NOTE — Evaluation (Addendum)
 Occupational Therapy Evaluation Patient Details Name: Diane Watkins MRN: [id number] DOB: May 21, 1951 Today's Date: 03/24/2024   History of Present Illness   Diane Watkins is a 73 y.o. female with and recent transforaminal lumbar decompression and fusion at L4-5 on 02/08/2024, readmission with HA, dural tear (repaired)  and Meningitis with AIR stay, presents with nausea and vomiting, as well as myoclonic jerking observed by family     Clinical Impressions Pt reported since recently was dc from hospital was ambulating in the home RW and was indep in ADLS. She reports her husband can assist with IADLS with the return to home. Pt at this time was CGA with bed mobility and sit to stand transfer to RW and was able to complete some ADLS in standing with CGA but easily fatigued. Patient will benefit from [facility] at discharge and Acute Occupational Therapy to follow.      If plan is discharge home, recommend the following:   A little help with walking and/or transfers;A little help with bathing/dressing/bathroom;Assist for transportation;Help with stairs or ramp for entrance     Functional Status Assessment   Patient has had a recent decline in their functional status and demonstrates the ability to make significant improvements in function in a reasonable and predictable amount of time.     Equipment Recommendations   None recommended by OT     Recommendations for Other Services   Rehab consult     Precautions/Restrictions   Precautions Precautions: Back;Fall Precaution Booklet Issued: No Recall of Precautions/Restrictions: Intact Precaution/Restrictions Comments: able to verbalize 3/3 precautions Other Brace: no brace needed per orders Restrictions Weight Bearing Restrictions Per Provider Order: No     Mobility Bed Mobility Overal bed mobility: Needs Assistance Bed Mobility: Supine to Sit     Supine to sit: Contact guard, HOB elevated, Used rails     General bed  mobility comments: increase in time and cued on positioning    Transfers Overall transfer level: Needs assistance Equipment used: Rolling walker (2 wheels) Transfers: Sit to/from Stand Sit to Stand: Contact guard assist           General transfer comment: cues on hand placement as attempting to pull up onto walker      Balance Overall balance assessment: Needs assistance Sitting-balance support: Feet supported Sitting balance-Leahy Scale: Good       Standing balance-Leahy Scale: Fair Standing balance comment: able to complete some ADLs in standing but fatigued easily                           ADL either performed or assessed with clinical judgement   ADL Overall ADL's : Needs assistance/impaired Eating/Feeding: Independent;Sitting   Grooming: Wash/dry face;Wash/dry hands;Contact guard assist;Standing   Upper Body Bathing: Set up;Sitting   Lower Body Bathing: Contact guard assist;Sit to/from stand   Upper Body Dressing : Set up;Sitting   Lower Body Dressing: Contact guard assist;Sit to/from stand   Toilet Transfer: Contact guard assist;Rolling walker (2 wheels)   Toileting- Clothing Manipulation and Hygiene: Contact guard assist;Sit to/from stand       Functional mobility during ADLs: Contact guard assist;Rolling walker (2 wheels);Cueing for sequencing;Cueing for safety       Vision Baseline Vision/History: 0 No visual deficits;1 Wears glasses Wears Glasses: Reading only Ability to See in Adequate Light: 0 Adequate Patient Visual Report: No change from baseline Vision Assessment?: No apparent visual deficits     Perception Perception: Within Functional Limits  Praxis Praxis: [facility]       Pertinent Vitals/Pain Pain Assessment Pain Assessment: Faces Faces Pain Scale: Hurts a little bit Pain Location: Generalized with effort Pain Descriptors / Indicators: Grimacing Pain Intervention(s): Limited activity within patient's tolerance,  Monitored during session, Repositioned     Extremity/Trunk Assessment Upper Extremity Assessment Upper Extremity Assessment: Generalized weakness   Lower Extremity Assessment Lower Extremity Assessment: Defer to PT evaluation   Cervical / Trunk Assessment Cervical / Trunk Assessment: Back Surgery;Other exceptions   Communication Communication Communication: No apparent difficulties   Cognition Arousal: Alert Behavior During Therapy: WFL for tasks assessed/performed Cognition: No apparent impairments                               Following commands: Intact       Cueing  General Comments   Cueing Techniques: Verbal cues;Gestural cues      Exercises     Shoulder Instructions      Home Living Family/patient expects to be discharged to:: Private residence Living Arrangements: Spouse/significant other Available Help at Discharge: Family;Available PRN/intermittently Type of Home: House Home Access: Stairs to enter [employer] of Steps: 3 Entrance Stairs-Rails: Right;Left Home Layout: Able to live on main level with bedroom/bathroom     Bathroom Shower/Tub: Tub/shower unit;Curtain   Bathroom Toilet: Standard Bathroom Accessibility: No How Accessible: Accessible via walker Home Equipment: Toilet riser;Rollator (4 wheels);Cane - single point;Tub bench   Additional Comments: was walking with RW and reports furniture walking  Lives With: Spouse    Prior Functioning/Environment Prior Level of Function : Independent/Modified Independent             Mobility Comments: mobilizing with RW, no falls, progressing distance with family ADLs Comments: incr time but ind    OT Problem List: Decreased activity tolerance;Impaired balance (sitting and/or standing);Decreased safety awareness;Decreased knowledge of use of DME or AE;Pain   OT Treatment/Interventions: Self-care/ADL training;Therapeutic exercise;DME and/or AE instruction;Therapeutic  activities;Patient/family education;Balance training      OT Goals(Current goals can be found in the care plan section)   Acute Rehab OT Goals Patient Stated Goal: to go to AIR OT Goal Formulation: With patient Time For Goal Achievement: 04/07/24 Potential to Achieve Goals: Fair   OT Frequency:  Min 2X/week    Co-evaluation              AM-PAC OT 6 Clicks Daily Activity     Outcome Measure Help from another person eating meals?: None Help from another person taking care of personal grooming?: None Help from another person toileting, which includes using toliet, bedpan, or urinal?: A Little Help from another person bathing (including washing, rinsing, drying)?: A Little Help from another person to put on and taking off regular upper body clothing?: None Help from another person to put on and taking off regular lower body clothing?: A Little 6 Click Score: 21   End of Session Equipment Utilized During Treatment: Gait belt;Rolling walker (2 wheels) Nurse Communication: Mobility status  Activity Tolerance: Patient tolerated treatment well Patient left: in chair;with call bell/phone within reach;with chair alarm set  OT Visit Diagnosis: Unsteadiness on feet (R26.81);Other abnormalities of gait and mobility (R26.89);Muscle weakness (generalized) (M62.81);Pain Pain - part of body:  (general)                Time: 9262-9181 OT Time Calculation (min): 41 min Charges:  OT General Charges $OT Visit: 1 Visit OT Evaluation $OT Eval Low  Complexity: 1 Low OT Treatments $Self Care/Home Management : 23-37 mins  Warrick POUR OTR/L  Acute Rehab Services  [phone] office number   Warrick Berber 03/24/2024, 8:44 AM

## 2024-03-24 NOTE — Plan of Care (Signed)
°  Problem: Education: °Goal: Knowledge of General Education information will improve °Description: Including pain rating scale, medication(s)/side effects and non-pharmacologic comfort measures °Outcome: Progressing °  °Problem: Clinical Measurements: °Goal: Respiratory complications will improve °Outcome: Progressing °  °Problem: Activity: °Goal: Risk for activity intolerance will decrease °Outcome: Progressing °  °Problem: Elimination: °Goal: Will not experience complications related to bowel motility °Outcome: Progressing °  °

## 2024-03-24 NOTE — Progress Notes (Signed)
 Physical Therapy Treatment Patient Details Name: Diane Watkins MRN: [id number] DOB: 04-27-1951 Today's Date: 03/24/2024   History of Present Illness Diane Watkins is a 73 y.o. female with and recent transforaminal lumbar decompression and fusion at L4-5 on 02/08/2024, readmission with HA, dural tear (repaired)  and Meningitis with AIR stay, presents with nausea and vomiting, as well as myoclonic jerking observed by family    PT Comments  Pt making excellent progress towards her physical therapy goals and myoclonic jerking has resolved. Pt transferring to standing and ambulating 400 ft with a RW without physical assist. Able to perform standing exercises with external support for BLE strengthening. In light of progress, updated recs to HHPT. Discussed with pt and CM/MD.    If plan is discharge home, recommend the following: A little help with walking and/or transfers;A lot of help with bathing/dressing/bathroom;Assistance with cooking/housework;Help with stairs or ramp for entrance   Can travel by private vehicle        Equipment Recommendations  None recommended by PT    Recommendations for Other Services       Precautions / Restrictions Precautions Precautions: Back;Fall Precaution Booklet Issued: No Recall of Precautions/Restrictions: Intact Precaution/Restrictions Comments: able to verbalize 3/3 precautions Other Brace: no brace needed per orders Restrictions Weight Bearing Restrictions Per Provider Order: No     Mobility  Bed Mobility               General bed mobility comments: OOB in chair    Transfers Overall transfer level: Modified independent Equipment used: Rolling walker (2 wheels)                    Ambulation/Gait Ambulation/Gait assistance: Supervision Gait Distance (Feet): 400 Feet Assistive device: Rolling walker (2 wheels) Gait Pattern/deviations: Step-through pattern, Decreased stride length       General Gait Details: Min cues  for upward gaze, but overall good posture and steady with RW   Stairs             Wheelchair Mobility     Tilt Bed    Modified Rankin (Stroke Patients Only)       Balance Overall balance assessment: Needs assistance Sitting-balance support: Feet supported Sitting balance-Leahy Scale: Good       Standing balance-Leahy Scale: Fair Standing balance comment: able to complete some ADLs in standing but fatigued easily                            Communication Communication Communication: No apparent difficulties  Cognition Arousal: Alert Behavior During Therapy: WFL for tasks assessed/performed   PT - Cognitive impairments: No apparent impairments                         Following commands: Intact      Cueing Cueing Techniques: Verbal cues, Gestural cues  Exercises General Exercises - Lower Extremity Hip ABduction/ADduction: AROM, Both, 5 reps, Standing Hip Flexion/Marching: AROM, Both, 10 reps, Standing Heel Raises: AROM, Both, 10 reps, Standing    General Comments        Pertinent Vitals/Pain Pain Assessment Pain Assessment: Faces Faces Pain Scale: Hurts a little bit Pain Location: R hip Pain Descriptors / Indicators: Grimacing, Discomfort Pain Intervention(s): Monitored during session    Home Living Family/patient expects to be discharged to:: Private residence Living Arrangements: Spouse/significant other Available Help at Discharge: Family;Available PRN/intermittently Type of Home: House Home Access: Stairs to  enter Entrance Stairs-Rails: Right;Left Entrance Stairs-Number of Steps: 3   Home Layout: Able to live on main level with bedroom/bathroom Home Equipment: Patent examiner (4 wheels);Cane - single point;Tub bench Additional Comments: was walking with RW and reports furniture walking    Prior Function            PT Goals (current goals can now be found in the care plan section) Acute Rehab PT  Goals Potential to Achieve Goals: Good Progress towards PT goals: Progressing toward goals    Frequency    Min 2X/week      PT Plan      Co-evaluation              AM-PAC PT 6 Clicks Mobility   Outcome Measure  Help needed turning from your back to your side while in a flat bed without using bedrails?: None Help needed moving from lying on your back to sitting on the side of a flat bed without using bedrails?: None Help needed moving to and from a bed to a chair (including a wheelchair)?: None Help needed standing up from a chair using your arms (e.g., wheelchair or bedside chair)?: None Help needed to walk in hospital room?: A Little Help needed climbing 3-5 steps with a railing? : A Little 6 Click Score: 22    End of Session Equipment Utilized During Treatment: Gait belt Activity Tolerance: Patient tolerated treatment well Patient left: in chair;with call bell/phone within reach;with chair alarm set Nurse Communication: Mobility status PT Visit Diagnosis: Unsteadiness on feet (R26.81);Muscle weakness (generalized) (M62.81)     Time: 9055-8993 PT Time Calculation (min) (ACUTE ONLY): 22 min  Charges:    $Therapeutic Activity: 8-22 mins PT General Charges $$ ACUTE PT VISIT: 1 Visit                     Aleck Daring, PT, DPT Acute Rehabilitation Services Office [phone]    [name] 03/24/2024, 10:20 AM

## 2024-03-24 NOTE — Evaluation (Signed)
 RT Evaluate and Treat Note  03/24/2024   Breathing is (select one): Same as normal    The following was found on auscultation (select multiple):  Bilateral Breath Sounds: Clear;Diminished (03/23/24 2002)             Cough Assessment:      Most Recent Chest Xray:... (No results found.No active disease.    The following medications and/or interventions were ordered/changed/discontinued as part of the Respiratory Treatment protocol:   Medication Changes: No medications changes neccessary at this time. Albuterol  PRN available if needed.    Airway Clearance Changes: Not Airway clearance recommended at this time   Oxygen Therapy Changes:Pt on RA at this time.

## 2024-03-24 NOTE — Progress Notes (Signed)
 Bladder scan performed after patient urinated 350 cc of amber colored urine. Scan revealed 0 (zero).

## 2024-03-24 NOTE — Progress Notes (Signed)
 Progress Note   Patient: Diane Watkins FMW:[id number] DOB: 24-Mar-1951 DOA: 03/22/2024     2 DOS: the patient was seen and examined on 03/24/2024   Brief hospital course: CC: dehydration HPI: Diane Watkins is a 73 y.o. female with medical history significant of hypertension, hyperlipidemia, diabetes mellitus type 2, arthritis, and recent transforaminal lumbar decompression and fusion at L4-5 on 02/08/2024 presents with nausea and vomiting.   She was just recently hospitalized from 8/31-9/10 after presenting with headache following her recent surgical procedure.  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  Patient underwent MRI which revealed persistent leak on 9/1 and underwent exploration of the lumbar wound with closure of dural defect by Dr. Joshua on 9/3.  Blood cultures from 9/6 positive for Enterobacter Cloacae for which ID was consulted and recommended 8 weeks of IV antibiotics for which a PICC line was placed on 9/9.  She went to rehab following her hospitalization.     She reports that the nausea and vomiting started last night after eating and drinking, but also mentions having a pattern of eating and then experiencing nausea for a couple of days. She has not had a chance to eat or drink anything today. No stomach pain or diarrhea. She confirms urination without discomfort and has not experienced any recent falls. No fevers have been noted.   Her husband, who lives with her, has been eating the same food but has not experienced similar symptoms. She mentions taking a medication similar to omeprazole, prescribed for stomach issues. Occasional shortness of breath is reported.   Over the phone patient's husband notes that she has been a little altered at times and talking out of her head.   In the ED patient was noted to be afebrile with heart rates elevated up to 115, and all other vital signs maintained.  Labs significant for hemoglobin 9.8, BUN 53, and creatinine 2.53.  CT  scan of the head did not reveal any acute intracranial abnormality.  CT scan of the abdomen and pelvis noted no acute abnormality with posterior rod and pedicle screw fixation at L4-L5 with stranding density posterior likely related to recent surgery with no discrete fluid collection appreciated.  Patient was bolused 2 L of IV fluids and Zofran .  TRH consulted to admit.  Assessment and Plan:  # AKI Patient presented with creatinine elevated to 2.53 with BUN 53.  Baseline creatinine around 0.8.  Received 2 L of IV fluids in the ED.  - UA negative for casts - Cr elevated to 3.54, was 2.72 yesterday - Thought to be most likely due to combination of dehydration in the setting of poor oral intake, nausea, and vomiting, the possibility of interstitial nephritis from cefepime . However, not adequately responding to fluids. - Continue NS 100 mL/hr for 10 hours - Ordered urine Na and urine creatinine - Nephrology consulted - spoke with Dr. Jerrye who kindly agreed to review and eval patient  # Nausea and vomiting  Patient presented with symptoms of nausea vomiting for few days.  Also noted her husband ate similar food without any issue. - CT abdomen pelvis showed no acute intra-abdominal abnormalities to explain the patient's symptoms - Reports improvement, tolerating regular diet - PRN Zofran   # Acute toxic metabolic encephalopathy - resolved - Patient's husband reported some confusion.  - CT head negative for acute intracranial findings - Etiology most likely multifactorial in the setting of acute renal failure complicated by cefepime  neurotoxicity, with improvement after discontinuation of cefepime  -  Delirium precautions  # History of Enterobacter cloacae bacteremia - Diagnosed with Enterobacter cloacae bacteremia on 02/20/2024 - Evaluated by ID with recommendations for 8 weeks of IV antibiotics for which a right upper extremity PICC line was placed on 02/23/2024 with the patient discharged on IV  cefepime  - ID consulted now due to concerns for cefepime  toxicity - antibiotics were initially revised to meropenem now changed to ertapenem - BCx (03/22/2024) showing no growth to date - Continue IV ertapenem  # Status post lumbar fusion Patient with history of CSF leak after decompression and fusion surgery at L4-L5 on 8/25.  Patient underwent exploration of lumbar wound with closure of dural deficit on 9/3 after MRI noted findings suggestive of a CSF leak.  CT scan of the abdomen pelvis noted posterior rod and pedicle screw fixation at L4-L5 but strandy density posteriorly thought to likely be related to the recent surgery with no discrete fluid collection present.  # Normocytic anemia - Hemoglobin stable, baseline  #NID-T2DM - Hemoglobin A1c 7 (02/03/2024) - Stable  #Hypertension - SBP trending up today, 130s-160 - Home lisinopril  held due to AKI - Continue metoprolol  12.5 mg twice daily - PRN IV hydralazine 10 mg every 6 hours for SBP >160 and/or DBP greater than sign 110  #Hyperlipidemia - Continue home atorvastatin    Subjective: Patient seen at bedside.  No acute events overnight per patient or RN staff.  Patient expresses concerns about the IV antibiotics she is receiving states she is worried that she will have similar side effects to what she had with cefepime .  States she wishes to be monitored to ensure lack of side effects.  Patient was reassured that she will be monitored while hospitalized and then the antibiotic choice has been tailored to avoid similar side effects to which she experienced.  Physical Exam: Vitals:   03/23/24 2036 03/24/24 0451 03/24/24 0931 03/24/24 1650  BP: (!) 151/72 132/69 (!) 160/70 (!) 159/74  Pulse: 76 64 64 66  Resp: 18 18 19 19   Temp: 98.8 F (37.1 C) 98.3 F (36.8 C) 98.2 F (36.8 C) 98.3 F (36.8 C)  TempSrc:      SpO2: 99% 98% 98% 100%  Weight:      Height:       Physical Exam Constitutional:      General: She is not in acute  distress.    Appearance: She is not ill-appearing.  HENT:     Mouth/Throat:     Mouth: Mucous membranes are moist.  Eyes:     Pupils: Pupils are equal, round, and reactive to light.  Cardiovascular:     Rate and Rhythm: Normal rate and regular rhythm.     Heart sounds: Normal heart sounds. No murmur heard. Pulmonary:     Effort: Pulmonary effort is normal. No respiratory distress.     Breath sounds: Normal breath sounds. No wheezing.  Abdominal:     General: Bowel sounds are normal. There is no distension.     Palpations: Abdomen is soft.     Tenderness: There is no abdominal tenderness. There is no guarding.  Musculoskeletal:     Right lower leg: Edema (trace) present.     Left lower leg: Edema (trace) present.     Comments: RUE PICC line in place  Skin:    General: Skin is warm and dry.     Capillary Refill: Capillary refill takes less than 2 seconds.  Neurological:     Mental Status: She is alert and oriented  to person, place, and time. Mental status is at baseline.     Sensory: Sensation is intact.     Motor: Motor function is intact.     Comments: Tremulousness in the right > left hand noted, worse with action    Family Communication: Discussed with patient and her husband, Marcey, at bedside.  Disposition: Status is: Inpatient Remains inpatient appropriate because: Pending improvement in renal function  Planned Discharge Destination: Home with Home Health  DVT prophylaxis: heparin injection 5,000 Units Start: 03/22/24 2200  Time spent: 40 minutes  Author: Duffy Larch, MD 03/24/2024 5:33 PM  For on call review [email or web address].

## 2024-03-24 NOTE — Consult Note (Signed)
 [name] Renal Consultation Note  Requesting MD: Duffy Larch, MD Indication for Consultation:  AKI  Chief complaint: nausea  HPI:  Diane Watkins is a 73 y.o. female with a history of diabetes mellitus, HTN, and hyperlipidemia who presented to the hospital with nausea which had been going on for a couple of days.  She has noticed some shaking and confusion.  She states she wasn't really aware of a change but her children noted confusion.  She states that her legs were swelling, as well.  Shaking was better after stopping the cefepime  but states now is shaking again.  She had cut down gabapentin  on her own at home and would like to stop because she felt it made her confused.  Note that she has a recent history of transforaminal lumbar decompression and fusion of L4-L5 on 02/08/24.  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  She had an exploration of her lumbar wound on 9/3 and on 9/6 blood cultures was found to have enterobacter bacteremia and has been on IV cefepime  at home.  She was found to have acute kidney injury with a creatinine up to 2.53 as of the 10/7 labs.  Her labs on 10/9 demonstrate that her creatinine has risen to 3.49.  Nephrology is consulted for assistance with management of acute kidney injury.  She is on lisinopril  at home.   She got a NS bolus as well as LR bolus earlier this admission.  She received NS at 100 ml/hr from 10/7 - 10/8.  Here, she was transitioned to meropenem and later ertapenem.  She has been off of cefepime .  She has a baseline Cr 0.8-0.9 and her creatinine was 0.79 on 03/07/24.  No documented hypotension.  Strict ins/outs are not available.  She had 1.3 liters UOP over 10/8 as well as 4 unmeasured urine voids.  Three voids documented today.  No acute process on 10/7 CT and specifically no hydro.  Nausea resolved since admission.  She hasn't had trouble urinating.  She states  fluids were started back this evening.  Just urinated quite a bit tonight.  No rash.  I reached out to her husband via phone this evening and did not get an answer.    Creat  Date/Time Value Ref Range Status  04/12/2014 09:04 AM 0.88 0.50 - 1.10 mg/dL Final   Creatinine, Ser  Date/Time Value Ref Range Status  03/24/2024 06:12 PM 3.49 (H) 0.44 - 1.00 mg/dL Final  89/91/7974 98:42 AM 2.72 (H) 0.44 - 1.00 mg/dL Final  89/92/7974 96:81 PM 2.53 (H) 0.44 - 1.00 mg/dL Final  90/77/7974 96:51 AM 0.79 0.44 - 1.00 mg/dL Final  90/81/7974 95:79 AM 0.85 0.44 - 1.00 mg/dL Final  90/84/7974 95:96 AM 0.91 0.44 - 1.00 mg/dL Final  90/86/7974 95:63 AM 0.74 0.44 - 1.00 mg/dL Final  90/88/7974 94:87 AM 0.78 0.44 - 1.00 mg/dL Final  90/89/7974 96:84 AM 0.88 0.44 - 1.00 mg/dL Final  90/90/7974 97:81 AM 0.82 0.44 - 1.00 mg/dL Final  90/91/7974 97:72 AM 0.92 0.44 - 1.00 mg/dL Final  90/92/7974 95:75 AM 1.02 (H) 0.44 - 1.00 mg/dL Final  90/93/7974 94:87 AM 0.89 0.44 - 1.00 mg/dL Final  90/94/7974 97:75 AM 0.89 0.44 - 1.00 mg/dL Final  90/95/7974 97:71 AM 0.97 0.44 - 1.00 mg/dL Final  90/96/7974 88:48 AM 0.98 0.44 - 1.00 mg/dL Final  90/98/7974 92:93 AM 0.90 0.44 - 1.00 mg/dL Final  91/68/7974 87:92  PM 1.10 (H) 0.44 - 1.00 mg/dL Final  91/73/7974 90:62 AM 0.95 0.44 - 1.00 mg/dL Final  91/79/7974 98:65 PM 0.87 0.44 - 1.00 mg/dL Final  97/82/7974 88:72 AM 0.83 0.57 - 1.00 mg/dL Final  97/83/7975 98:55 PM 0.98 0.57 - 1.00 mg/dL Final  91/83/7976 97:53 PM 0.91 0.57 - 1.00 mg/dL Final  96/79/7976 89:44 AM 0.91 0.44 - 1.00 mg/dL Final  97/96/7976 90:87 AM 1.07 (H) 0.44 - 1.00 mg/dL Final  95/85/7977 93:65 PM 1.02 (H) 0.44 - 1.00 mg/dL Final  97/91/7977 89:68 AM 0.90 0.57 - 1.00 mg/dL Final  98/77/7978 90:48 AM 1.02 (H) 0.57 - 1.00 mg/dL Final  89/98/7979 98:77 AM 0.79 0.44 - 1.00 mg/dL Final  90/69/7979 87:53 AM 0.91 0.44 - 1.00 mg/dL Final  90/70/7979 94:69 AM 0.97 0.44 - 1.00 mg/dL Final  90/71/7979 95:74  AM 1.11 (H) 0.44 - 1.00 mg/dL Final  90/73/7979 91:71 PM 0.85 0.44 - 1.00 mg/dL Final  89/92/7980 88:72 AM 0.94 0.44 - 1.00 mg/dL Final  98/81/7980 91:53 AM 0.95 0.57 - 1.00 mg/dL Final  93/77/7981 89:81 AM 0.96 0.57 - 1.00 mg/dL Final  93/90/7982 90:79 AM 0.92 0.57 - 1.00 mg/dL Final     PMHx:   Past Medical History:  Diagnosis Date   Arthritis    Diabetes mellitus without complication (HCC)    High cholesterol    History of hiatal hernia    Hyperlipidemia    Hypertension    Postoperative CSF leak 02/24/2024    Past Surgical History:  Procedure Laterality Date   BIOPSY  07/24/2021   Procedure: BIOPSY;  Surgeon: Golda Claudis PENNER, MD;  [facility]: [facility];  Service: Endoscopy;;  polyp   CATARACT EXTRACTION W/PHACO Left 03/29/2018   Procedure: CATARACT EXTRACTION PHACO AND INTRAOCULAR LENS PLACEMENT (IOC);  Surgeon: Perley Hamilton, MD;  Location: [facility];  Service: Ophthalmology;  Laterality: Left;  CDE: 10.13   CHOLECYSTECTOMY     COLONOSCOPY WITH PROPOFOL  N/A 07/24/2021   Procedure: COLONOSCOPY WITH PROPOFOL ;  Surgeon: Golda Claudis PENNER, MD;  [facility]: [facility];  Service: Endoscopy;  Laterality: N/A;   ESOPHAGOGASTRODUODENOSCOPY (EGD) WITH PROPOFOL  N/A 07/24/2021   Procedure: ESOPHAGOGASTRODUODENOSCOPY (EGD) WITH PROPOFOL ;  Surgeon: Golda Claudis PENNER, MD;  [facility]: [facility];  Service: Endoscopy;  Laterality: N/A;   HARDWARE REMOVAL N/A 02/17/2024   Procedure: REMOVAL, HARDWARE;  Surgeon: Reyne Cordella SQUIBB, MD;  Location: [facility];  Service: Orthopedics;  Laterality: N/A;   INCISIONAL HERNIA REPAIR N/A 09/04/2021   Procedure: HERNIA REPAIR INCISIONAL W/MESH;  Surgeon: Mavis Anes, MD;  Location: [facility];  Service: General;  Laterality: N/A;   POLYPECTOMY  07/24/2021   Procedure: POLYPECTOMY;  Surgeon: Golda Claudis PENNER, MD;  [facility]: [facility];  Service: Endoscopy;;   TRANSFORAMINAL LUMBAR INTERBODY FUSION (TLIF) WITH PEDICLE SCREW FIXATION 1 LEVEL N/A 02/08/2024   Procedure:  TRANSFORAMINAL LUMBAR INTERBODY FUSION (TLIF) WITH PEDICLE SCREW FIXATION 1 LEVEL;  Surgeon: Reyne Cordella SQUIBB, MD;  Location: [facility];  Service: Orthopedics;  Laterality: N/A;  Transforaminal lumber interbody fusion with pedicle screw fixation, lumbar 4-lumbar 5   TUBAL LIGATION     WOUND EXPLORATION N/A 02/17/2024   Procedure: WOUND EXPLORATION;  Surgeon: Reyne Cordella SQUIBB, MD;  Location: [facility];  Service: Orthopedics;  Laterality: N/A;  Exploration of wound, Repair of Spinal fluid Leak Lumbar four-five  Removal of screws and Replacement of same screws    Family Hx:  Family History  Problem Relation Age of Onset   Stroke Mother  Alcohol  abuse Father    Heart disease Father 24       MI    Social History:  reports that she has never smoked. She has been exposed to tobacco smoke. She has never used smokeless tobacco. She reports that she does not currently use alcohol . She reports that she does not use drugs.  Allergies:  Allergies  Allergen Reactions   Amoxil [Amoxicillin] Other (See Comments)    Bad headaches Has patient had a PCN reaction causing immediate rash, facial/tongue/throat swelling, SOB or lightheadedness with hypotension: No Has patient had a PCN reaction causing severe rash involving mucus membranes or skin necrosis: No Has patient had a PCN reaction that required hospitalization: No Has patient had a PCN reaction occurring within the last 10 years: No If all of the above answers are NO, then may proceed with Cephalosporin use.    Oxycodone -Acetaminophen  Itching    Medications: Prior to Admission medications   Medication Sig Start Date End Date Taking? Authorizing Provider  B Complex-C (B-COMPLEX WITH VITAMIN C) tablet Take 1 tablet by mouth daily. 03/08/24  Yes Love, Sharlet RAMAN, PA-C  cetirizine (ZYRTEC) 10 MG tablet Take 10 mg by mouth at bedtime.   Yes [provider]  cyanocobalamin  (VITAMIN B12) 1000 MCG tablet Take 1,000 mcg by mouth in the morning.    Yes [provider]  cyclobenzaprine  (FLEXERIL ) 10 MG tablet Take 1 tablet (10 mg total) by mouth 3 (three) times daily as needed for muscle spasms. 03/07/24  Yes Love, Sharlet RAMAN, PA-C  diclofenac  Sodium (VOLTAREN ) 1 % GEL Apply 2 g topically 4 (four) times daily. Patient taking differently: Apply 2 g topically 4 (four) times daily as needed. 03/07/24  Yes Love, Sharlet RAMAN, PA-C  docusate sodium  (COLACE) 100 MG capsule Take 1 capsule (100 mg total) by mouth daily. Patient taking differently: Take 100 mg by mouth in the morning. 03/08/24  Yes Love, Sharlet RAMAN, PA-C  gabapentin  (NEURONTIN ) 100 MG capsule Take 2 capsules (200 mg total) by mouth 3 (three) times daily. Patient taking differently: Take 100 mg by mouth 2 (two) times daily. 03/07/24  Yes Love, Sharlet RAMAN, PA-C  lisinopril  (ZESTRIL ) 20 MG tablet Take 1 tablet (20 mg total) by mouth every evening. 03/07/24  Yes Love, Sharlet RAMAN, PA-C  magnesium  gluconate (MAGONATE) 500 (27 Mg) MG TABS tablet Take 0.5 tablets (250 mg total) by mouth at bedtime. 03/07/24  Yes Love, Sharlet RAMAN, PA-C  metoprolol  tartrate (LOPRESSOR ) 25 MG tablet Take 0.5 tablets (12.5 mg total) by mouth 2 (two) times daily. 03/07/24  Yes Love, Sharlet RAMAN, PA-C  omega-3 acid ethyl esters (LOVAZA ) 1 g capsule Take 2 capsules (2 g total) by mouth 2 (two) times daily. 08/03/23  Yes Cook, Jayce G, DO  Polyethyl Glycol-Propyl Glycol 0.4-0.3 % SOLN Place 1-2 drops into both eyes 3 (three) times daily as needed (pain.).   Yes [provider]  polyethylene glycol powder (GLYCOLAX /MIRALAX ) 17 GM/SCOOP powder Mix 17 g (1 capful) in 4-8 oz of liquid and take by mouth daily. Patient taking differently: Take 17 g by mouth as needed for mild constipation or moderate constipation. 03/08/24  Yes Love, Sharlet RAMAN, PA-C  prochlorperazine  (COMPAZINE ) 5 MG tablet Take 1-2 tablets (5-10 mg total) by mouth every 6 (six) hours as needed for nausea. 03/07/24  Yes Love, Sharlet RAMAN, PA-C  rosuvastatin  (CRESTOR ) 40  MG tablet Take 1 tablet (40 mg total) by mouth every evening. 03/07/24  Yes Love, Sharlet RAMAN, PA-C  traMADol  (ULTRAM ) 50 MG tablet Take 1 tablet (50 mg total) by mouth every 6 (six) hours as needed for moderate pain (pain score 4-6). 03/07/24  Yes Love, Sharlet RAMAN, PA-C  traZODone  (DESYREL ) 50 MG tablet Take 0.5-1 tablets (25-50 mg total) by mouth at bedtime as needed for sleep. 03/07/24  Yes Love, Sharlet RAMAN, PA-C  ceFEPime  (MAXIPIME ) IVPB Inject 2 g into the vein every 8 (eight) hours. Indication:  Enterobacter cloacae bacteremia/lumbar hardware infection First Dose: Yes Last Day of Therapy:  04/16/24  Labs - Once weekly:  CBC/D and CMET Labs - Once weekly: ESR and CRP Method of administration: IV Push Method of administration may be changed at the discretion of home infusion pharmacist based upon assessment of the patient and/or caregiver's ability to self-administer the medication ordered. Patient not taking: Reported on 03/22/2024 03/07/24   Love, Sharlet RAMAN, PA-C  topiramate  (TOPAMAX ) 25 MG tablet Take 1 tablet (25 mg total) by mouth at bedtime. Patient not taking: Reported on 03/22/2024 03/07/24   Love, Sharlet RAMAN, PA-C   I have reviewed the patient's current and reported prior to admission medications.   Labs:     Latest Ref Rng & Units 03/24/2024    6:12 PM 03/23/2024    1:57 AM 03/22/2024    3:18 PM  BMP  Glucose 70 - 99 mg/dL 860  880  897   BUN 8 - 23 mg/dL 51  50  53   Creatinine 0.44 - 1.00 mg/dL 6.50  7.27  7.46   Sodium 135 - 145 mmol/L 137  140  141   Potassium 3.5 - 5.1 mmol/L 5.0  4.5  4.6   Chloride 98 - 111 mmol/L 108  112  111   CO2 22 - 32 mmol/L 18  18  20    Calcium  8.9 - 10.3 mg/dL 9.4  8.9  9.7     Urinalysis    Component Value Date/Time   COLORURINE YELLOW 03/22/2024 1715   APPEARANCEUR CLEAR 03/22/2024 1715   LABSPEC 1.008 03/22/2024 1715   PHURINE 5.0 03/22/2024 1715   GLUCOSEU NEGATIVE 03/22/2024 1715   HGBUR MODERATE (A) 03/22/2024 1715   BILIRUBINUR NEGATIVE  03/22/2024 1715   KETONESUR NEGATIVE 03/22/2024 1715   PROTEINUR 30 (A) 03/22/2024 1715   NITRITE NEGATIVE 03/22/2024 1715   LEUKOCYTESUR NEGATIVE 03/22/2024 1715     ROS:  Pertinent items noted in HPI and remainder of comprehensive ROS otherwise negative.  Physical Exam: Vitals:   03/24/24 0931 03/24/24 1650  BP: (!) 160/70 (!) 159/74  Pulse: 64 66  Resp: 19 19  Temp: 98.2 F (36.8 C) 98.3 F (36.8 C)  SpO2: 98% 100%     General:  elderly female in bed in NAD   HEENT: NCAT Eyes: EOMI sclera anicteric Neck: supple trachea midline  Heart: S1S2 no rub Lungs: clear to auscultation; normal work of breathing at rest on room air  Abdomen: soft/nt/nd; obese habitus Extremities: no pitting edema; no cyanosis or clubbing Skin: no rash on extremities exposed Neuro: awake on arrival and provides name, year, location, and basic situation Psych no anxiety or agitation GU no foley  Assessment/Plan:   # AKI  - May be AIN from cefepime  and is compounded by pre-renal insults with lisinopril  use and nausea/reduced intake.  She has no documented hypotension.  Baseline Cr less than 1 - Continue fluids as tolerated x 24 hours - Check urine protein/cr ratio - Re-ordered strict ins/outs - ordered but not being obtained - Ordered daily  weights   - Hopeful for plateau with supportive care - Check post-void residual bladder scan and in/out cath if over 300 mL urine retained  - Hold home lisinopril  - Defer empiric prednisone  for now but may need to consider if refractory to conservative measures and to stopping the cefepime    # AMS - Agree with transition off of cefepime  as may be related - Would hold gabapentin  if worsens  # Metabolic acidosis - Start oral bicarbonate for now   # Normocytic Anemia  - Improved on most recent check   - Recent iron panel with mild iron deficiency - Start oral iron OTC  # Enterobacter bacteremia - Positive on 9/6 blood cultures - Abx per ID and  primary team  - Previously on cefepime  and has been transitioned to ertapenem  # HTN  - Reasonable control on current regimen  - Would avoid hypotension   Thank you for the consult.  Please do not hesitate to contact me with any questions regarding our patient.    Katheryn JAYSON Saba 03/24/2024, 9:13 PM

## 2024-03-24 NOTE — TOC Progression Note (Addendum)
 Transition of Care [facility]) - Progression Note    Patient Details  Name: Diane Watkins MRN: [id number] Date of Birth: 07/07/1950  [facility] CM/SW Contact  Tom-Johnson, Harvest Muskrat, RN Phone Number: 03/24/2024, 9:25 AM  Clinical Narrative:     CIR recommended for discharge disposition, following for potential admit.   Patient not Medically ready for discharge.  CM will continue to follow as patient progresses with care towards discharge.   14:00- PT/OT discharge recommendation changed to Home health. Patient is active with [facility] Resumption of care referral sent to [facility] with acceptance noted, info on AVS.  CM consulted for home with IV abx. Patient has no preference, CM called in referral to [facility] and Pam noted acceptance, info on AVS.   Patient not Medically ready for discharge.  CM will continue to follow as patient progresses with care towards discharge.                         Expected Discharge Plan and Services                                               Social Drivers of Health (SDOH) Interventions SDOH Screenings   Food Insecurity: No Food Insecurity (03/22/2024)  Housing: Low Risk  (03/22/2024)  Transportation Needs: No Transportation Needs (03/22/2024)  Utilities: Not At Risk (03/22/2024)  Alcohol  Screen: Low Risk  (08/03/2023)  Depression (PHQ2-9): Low Risk  (12/28/2023)  Financial Resource Strain: Low Risk  (08/03/2023)  Physical Activity: Insufficiently Active (08/03/2023)  Social Connections: Moderately Integrated (03/22/2024)  Stress: No Stress Concern Present (08/03/2023)  Tobacco Use: Low Risk  (03/22/2024)  Health Literacy: Adequate Health Literacy (02/13/2023)    Readmission Risk Interventions    03/23/2024    1:01 PM 02/16/2024    3:50 PM  Readmission Risk Prevention Plan  Post Dischage Appt  Complete  Medication Screening  Complete  Transportation Screening Complete Complete  PCP or Specialist Appt within  3-5 Days Complete   HRI or Home Care Consult Complete   Social Work Consult for Recovery Care Planning/Counseling Complete   Palliative Care Screening Not Applicable   Medication Review Oceanographer) Referral to Pharmacy

## 2024-03-24 NOTE — Progress Notes (Signed)
 IP rehab admisisons - Noted patient is doing well now and ambulated 400'.  Received notification from PT that patient is now recommended for home with HH therapies.  I will sign off for inpatient rehab at this time.  [phone]

## 2024-03-25 DIAGNOSIS — N179 Acute kidney failure, unspecified: Secondary | ICD-10-CM | POA: Diagnosis not present

## 2024-03-25 DIAGNOSIS — Z981 Arthrodesis status: Secondary | ICD-10-CM | POA: Diagnosis not present

## 2024-03-25 DIAGNOSIS — Z87898 Personal history of other specified conditions: Secondary | ICD-10-CM | POA: Diagnosis not present

## 2024-03-25 LAB — CBC
HCT: 26.4 % — ABNORMAL LOW (ref 36.0–46.0)
Hemoglobin: 8.6 g/dL — ABNORMAL LOW (ref 12.0–15.0)
MCH: 30.6 pg (ref 26.0–34.0)
MCHC: 32.6 g/dL (ref 30.0–36.0)
MCV: 94 fL (ref 80.0–100.0)
Platelets: 192 K/uL (ref 150–400)
RBC: 2.81 MIL/uL — ABNORMAL LOW (ref 3.87–5.11)
RDW: 14.4 % (ref 11.5–15.5)
WBC: 7.6 K/uL (ref 4.0–10.5)
nRBC: 0 % (ref 0.0–0.2)

## 2024-03-25 LAB — RENAL FUNCTION PANEL
Albumin: 2.2 g/dL — ABNORMAL LOW (ref 3.5–5.0)
Anion gap: 10 (ref 5–15)
BUN: 51 mg/dL — ABNORMAL HIGH (ref 8–23)
CO2: 18 mmol/L — ABNORMAL LOW (ref 22–32)
Calcium: 8.8 mg/dL — ABNORMAL LOW (ref 8.9–10.3)
Chloride: 111 mmol/L (ref 98–111)
Creatinine, Ser: 3.52 mg/dL — ABNORMAL HIGH (ref 0.44–1.00)
GFR, Estimated: 13 mL/min — ABNORMAL LOW (ref >60)
Glucose, Bld: 93 mg/dL (ref 70–99)
Phosphorus: 4 mg/dL (ref 2.5–4.6)
Potassium: 4.6 mmol/L (ref 3.5–5.1)
Sodium: 139 mmol/L (ref 135–145)

## 2024-03-25 LAB — SODIUM, URINE, RANDOM: Sodium, Ur: 66 mmol/L

## 2024-03-25 LAB — CREATININE, URINE, RANDOM: Creatinine, Urine: 33 mg/dL

## 2024-03-25 MED ORDER — HYDRALAZINE HCL 20 MG/ML IJ SOLN
10.0000 mg | Freq: Four times a day (QID) | INTRAMUSCULAR | Status: DC | PRN
Start: 2024-03-25 — End: 2024-03-28
  Administered 2024-03-28: 10 mg via INTRAVENOUS
  Filled 2024-03-25: qty 1

## 2024-03-25 MED ORDER — SODIUM CHLORIDE 0.9% FLUSH: 10.0000 mL | INTRAVENOUS | Status: DC | PRN

## 2024-03-25 MED ORDER — AMLODIPINE BESYLATE 5 MG PO TABS
5.0000 mg | ORAL_TABLET | Freq: Every day | ORAL | Status: DC
  Administered 2024-03-25 – 2024-03-26 (×2): 5 mg via ORAL
  Filled 2024-03-25 (×2): qty 1

## 2024-03-25 MED ORDER — SODIUM CHLORIDE 0.9 % IV SOLN: INTRAVENOUS | Status: AC

## 2024-03-25 NOTE — Care Management Important Message (Signed)
 Important Message  Patient Details  Name: Diane Watkins MRN: [id number] Date of Birth: [birth year]   Important Message Given:  Yes - Medicare IM     Claretta Deed 03/25/2024, 3:45 PM

## 2024-03-25 NOTE — Progress Notes (Addendum)
 Progress Note   Patient: Diane Watkins FMW:[id number] DOB: 08/28/1950 DOA: 03/22/2024     3 DOS: the patient was seen and examined on 03/25/2024   Brief hospital course: CC: dehydration HPI: Diane Watkins is a 73 y.o. female with medical history significant of hypertension, hyperlipidemia, diabetes mellitus type 2, arthritis, and recent transforaminal lumbar decompression and fusion at L4-5 on 02/08/2024 presents with nausea and vomiting.   She was just recently hospitalized from 8/31-9/10 after presenting with headache following her recent surgical procedure.  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  Patient underwent MRI which revealed persistent leak on 9/1 and underwent exploration of the lumbar wound with closure of dural defect by Dr. Joshua on 9/3.  Blood cultures from 9/6 positive for Enterobacter Cloacae for which ID was consulted and recommended 8 weeks of IV antibiotics for which a PICC line was placed on 9/9.  She went to rehab following her hospitalization.     She reports that the nausea and vomiting started last night after eating and drinking, but also mentions having a pattern of eating and then experiencing nausea for a couple of days. She has not had a chance to eat or drink anything today. No stomach pain or diarrhea. She confirms urination without discomfort and has not experienced any recent falls. No fevers have been noted.   Her husband, who lives with her, has been eating the same food but has not experienced similar symptoms. She mentions taking a medication similar to omeprazole, prescribed for stomach issues. Occasional shortness of breath is reported.   Over the phone patient's husband notes that she has been a little altered at times and talking out of her head.   In the ED patient was noted to be afebrile with heart rates elevated up to 115, and all other vital signs maintained.  Labs significant for hemoglobin 9.8, BUN 53, and creatinine 2.53.  CT  scan of the head did not reveal any acute intracranial abnormality.  CT scan of the abdomen and pelvis noted no acute abnormality with posterior rod and pedicle screw fixation at L4-L5 with stranding density posterior likely related to recent surgery with no discrete fluid collection appreciated.  Patient was bolused 2 L of IV fluids and Zofran .  TRH consulted to admit.  Assessment and Plan:  # AKI Patient presented with creatinine elevated to 2.53 with BUN 53.  Baseline creatinine around 0.8.  Received 2 L of IV fluids in the ED.  - UA negative for casts - Cr elevated to 3.52 but appears to be plateauing - Thought to be most likely due to combination of dehydration in the setting of poor oral intake, nausea, and vomiting, the possibility of interstitial nephritis from cefepime . However, not adequately responding to fluids. - FeNa (10/10) - 5.1%  - PVR negative for urinary retention - Nephrology, Dr. Jerrye, evaluated patient - indicated AKI possibly due to AIN from cefepime  with prerenal insult from lisinopril  and hypovolemia - Urine protein/creatinine ratio - pending - Continue NS 100 mL/hr   #NAGMA - Bicarb 18, corrected AG 14.5 - Continue PO bicarb  # Nausea and vomiting -- resolved Patient presented with symptoms of nausea vomiting for few days.  Also noted her husband ate similar food without any issue. - CT abdomen pelvis showed no acute intra-abdominal abnormalities to explain the patient's symptoms - Reports improvement, tolerating regular diet - PRN Zofran   # Acute toxic metabolic encephalopathy - resolved - Patient's husband reported some confusion.  -  CT head negative for acute intracranial findings - Etiology most likely multifactorial in the setting of acute renal failure complicated by cefepime  neurotoxicity, with improvement after discontinuation of cefepime  - Delirium precautions - Gabapentin  discontinued  # History of Enterobacter cloacae bacteremia - Diagnosed with  Enterobacter cloacae bacteremia on 02/20/2024. Evaluated by ID with recommendations for 8 weeks of IV antibiotics for which a right upper extremity PICC line was placed on 02/23/2024 with the patient discharged on IV cefepime  - ID consulted due to concerns for cefepime  toxicity - antibiotics were initially revised to meropenem now changed to ertapenem - BCx (03/22/2024) showing no growth to date - Continue IV ertapenem  # Status post lumbar fusion Patient with history of CSF leak after decompression and fusion surgery at L4-L5 on 8/25.  Patient underwent exploration of lumbar wound with closure of dural deficit on 9/3 after MRI noted findings suggestive of a CSF leak.  CT scan of the abdomen pelvis noted posterior rod and pedicle screw fixation at L4-L5 but strandy density posteriorly thought to likely be related to the recent surgery with no discrete fluid collection present.  # Normocytic anemia - Hemoglobin stable, baseline  #NID-T2DM - Hemoglobin A1c 7 (02/03/2024) - Stable  #Hypertension - BP is elevated today with systolic in the 140s to 160s - Home lisinopril  held due to AKI - Continue metoprolol  12.5 mg twice daily - Started amlodipine 5 mg daily - PRN IV hydralazine 10 mg every 6 hours for SBP >160 and/or DBP > 110  #Hyperlipidemia - Continue home atorvastatin    Subjective: Patient seen at bedside.  No acute events overnight per patient or RN staff.  Patient's husband, daughter-in-law, and grandson were at bedside.  She reports doing well today and is relieved that she wa seen by the nephrology team.  She continues to report poor appetite, stating that food just does not taste good to her.  However, denies any nausea or vomiting at this time.  Encouraged her to continue to maintain oral hydration as best as possible. Reports tremulousness in bilateral hands has improved.  Physical Exam: Vitals:   03/25/24 0413 03/25/24 0416 03/25/24 0846 03/25/24 1053  BP: (!) 144/68 (!) 169/95 (!)  149/70   Pulse: 76 81 72 72  Resp: 18  19 18   Temp: 98.4 F (36.9 C) 98.4 F (36.9 C) 98.3 F (36.8 C)   TempSrc: Oral Oral    SpO2: 100% 100% 99% 100%  Weight:      Height:       Physical Exam Constitutional:      General: She is not in acute distress.    Appearance: She is not ill-appearing.  HENT:     Mouth/Throat:     Mouth: Mucous membranes are moist.  Eyes:     Pupils: Pupils are equal, round, and reactive to light.  Cardiovascular:     Rate and Rhythm: Normal rate and regular rhythm.     Heart sounds: Normal heart sounds. No murmur heard. Pulmonary:     Effort: Pulmonary effort is normal. No respiratory distress.     Breath sounds: Normal breath sounds. No wheezing.  Abdominal:     General: Bowel sounds are normal. There is no distension.     Palpations: Abdomen is soft.     Tenderness: There is no abdominal tenderness. There is no guarding.  Musculoskeletal:     Right lower leg: Edema (trace) present.     Left lower leg: Edema (trace) present.     Comments:  RUE PICC line in place  Skin:    General: Skin is warm and dry.     Capillary Refill: Capillary refill takes less than 2 seconds.  Neurological:     Mental Status: She is alert and oriented to person, place, and time. Mental status is at baseline.     Sensory: Sensation is intact.     Motor: Motor function is intact.     Comments: Tremulousness in the right > left hand noted, worse with action    Family Communication: Discussed with patient, her husband Marcey, and her daughter-in-law  Disposition: Status is: Inpatient Remains inpatient appropriate because: Pending improvement in renal function  Planned Discharge Destination: Home with Home Health  DVT prophylaxis: heparin injection 5,000 Units Start: 03/22/24 2200  Time spent: 40 minutes  Author: Duffy Larch, MD 03/25/2024 11:04 AM  For on call review [email or web address].

## 2024-03-25 NOTE — Progress Notes (Signed)
 [facility] Progress Note   Subjective:   Seen in room - says feels sleepy and woozy.  Was worse yesterday with myoclonic jerks too by her report.  Gabapentin  was stopped yesterday and the myoclonic jerks have subsided.  No LUTs. I/Os 2.2 / 1.1 all UOP.  Cr plateaued at 3.5 today. Says po intake isn't great  Objective Vitals:   03/24/24 2029 03/25/24 0413 03/25/24 0416 03/25/24 0846  BP: (!) 156/70 (!) 144/68 (!) 169/95 (!) 149/70  Pulse: 71 76 81 72  Resp: 18 18  19   Temp: 98.4 F (36.9 C) 98.4 F (36.9 C) 98.4 F (36.9 C) 98.3 F (36.8 C)  TempSrc: Oral Oral Oral   SpO2: 99% 100% 100% 99%  Weight:      Height:       Physical Exam General: comfortable in chair Heart:RRR no rub Lungs: clear on RA Extremities:1+ LE edema - says it was 'much worse' previously Neuro: AOX3 but says feels hazy on details, nonfocal   Additional Objective Labs: Basic Metabolic Panel: Recent Labs  Lab 03/22/24 1518 03/23/24 0157 03/24/24 1812 03/25/24 0249  NA 141 140 137 139  K 4.6 4.5 5.0 4.6  CL 111 112* 108 111  CO2 20* 18* 18* 18*  GLUCOSE 102* 119* 139* 93  BUN 53* 50* 51* 51*  CREATININE 2.53* 2.72* 3.49* 3.52*  CALCIUM  9.7 8.9 9.4 8.8*  PHOS 3.9  --   --  4.0   Liver Function Tests: Recent Labs  Lab 03/22/24 1518 03/25/24 0249  AST 27  --   ALT 25  --   ALKPHOS 47  --   BILITOT 0.5  --   PROT 5.9*  --   ALBUMIN  2.6* 2.2*   No results for input(s): LIPASE, AMYLASE in the last 168 hours. CBC: Recent Labs  Lab 03/22/24 1518 03/23/24 0157 03/24/24 1812 03/25/24 0249  WBC 7.2 7.3 8.1 7.6  NEUTROABS 4.8  --   --   --   HGB 9.8* 8.5* 10.2* 8.6*  HCT 30.2* 26.3* 31.2* 26.4*  MCV 95.9 93.6 93.1 94.0  PLT 197 183 226 192   Blood Culture    Component Value Date/Time   SDES BLOOD LEFT HAND 03/22/2024 2121   SPECREQUEST  03/22/2024 2121    BOTTLES DRAWN AEROBIC AND ANAEROBIC Blood Culture adequate volume   CULT  03/22/2024 2121    NO GROWTH 3  DAYS Performed at [facility], [address], [place], [place] [address]    REPTSTATUS PENDING 03/22/2024 2121    Cardiac Enzymes: Recent Labs  Lab 03/22/24 2050  CKTOTAL 77   CBG: No results for input(s): GLUCAP in the last 168 hours. Iron Studies: No results for input(s): IRON, TIBC, TRANSFERRIN, FERRITIN in the last 72 hours. @lablastinr3 @ Studies/Results: No results found. Medications:  sodium chloride  100 mL/hr at 03/24/24 2026   ertapenem 500 mg (03/24/24 1100)    Chlorhexidine  Gluconate Cloth  6 each Topical Q0600   heparin  5,000 Units Subcutaneous Q8H   iron polysaccharides  150 mg Oral Daily   metoprolol  tartrate  12.5 mg Oral BID   pantoprazole  (PROTONIX ) IV  40 mg Intravenous Q24H   rosuvastatin   40 mg Oral QPM   sodium bicarbonate  650 mg Oral BID   sodium chloride  flush  3 mL Intravenous Q12H   Assessment/Plan:     # AKI  - May be AIN from cefepime  and is compounded by pre-renal insults with lisinopril  use and nausea/reduced intake.  She has no documented hypotension.  Baseline Cr less than 1 -cefepime  has been stopped and cr plateaued today - with po intake not robust will continue isotonic fluids today, she has some edema but actually says it's improving - Check urine protein/cr ratio - ordered not done - Cont strict I/Os daily weights   - Hopeful for improvement with supportive care - Bladder scan 10/9 ok - Cont holding home lisinopril  - Defer empiric prednisone  for now but may need to consider if refractory to conservative measures and to stopping the cefepime     # AMS - Agree with transition off of cefepime  as may be related - Gabapentin  on hold too - seems to be improving some   # Metabolic acidosis - Cont oral bicarbonate for now    # Normocytic Anemia  - Improved on most recent check   - Recent iron panel with mild iron deficiency - Cont oral iron OTC   # Enterobacter bacteremia - Positive on 9/6 blood cultures - Abx  per ID and primary team  - Previously on cefepime  and has been transitioned to ertapenem   # HTN  - Reasonable control on current regimen  - Would avoid hypotension    Thank you for the consult.  Please do not hesitate to contact me with any questions regarding our patient.   Manuelita Barters MD 03/25/2024, 10:48 AM  [facility] Pager: [phone]

## 2024-03-25 NOTE — Plan of Care (Signed)
  Problem: Education: Goal: Knowledge of General Education information will improve Description Including pain rating scale, medication(s)/side effects and non-pharmacologic comfort measures Outcome: Progressing   Problem: Clinical Measurements: Goal: Ability to maintain clinical measurements within normal limits will improve Outcome: Progressing Goal: Will remain free from infection Outcome: Progressing   Problem: Nutrition: Goal: Adequate nutrition will be maintained Outcome: Progressing   Problem: Coping: Goal: Level of anxiety will decrease Outcome: Progressing   Problem: Safety: Goal: Ability to remain free from injury will improve Outcome: Progressing   Problem: Skin Integrity: Goal: Risk for impaired skin integrity will decrease Outcome: Progressing

## 2024-03-25 NOTE — Progress Notes (Signed)
 Mobility Specialist Progress Note:    03/25/24 1144  Mobility  Activity Ambulated with assistance  Level of Assistance Standby assist, set-up cues, supervision of patient - no hands on  Assistive Device Front wheel walker  Distance Ambulated (ft) 400 ft  Activity Response Tolerated well  Mobility Referral Yes  Mobility visit 1 Mobility  Mobility Specialist Start Time (ACUTE ONLY) 1032  Mobility Specialist Stop Time (ACUTE ONLY) 1045  Mobility Specialist Time Calculation (min) (ACUTE ONLY) 13 min   Pt received in chair agreeable to mobility. Requested to use the BR prior to ambulating halls. No physical assistance needed. No c/o throughout. Pt required reminders for proper back precautions. Returned to room w/o fault. Left in chair w/ call bell and personal belongings in reach. All needs met.  Thersia Minder Mobility Specialist  Please contact vis Secure Chat or  Rehab Office [phone]

## 2024-03-25 NOTE — Progress Notes (Signed)
 Physical Therapy Treatment Patient Details Name: Diane Watkins MRN: [id number] DOB: June 25, 1950 Today's Date: 03/25/2024   History of Present Illness Diane Watkins is a 73 y.o. female with and recent transforaminal lumbar decompression and fusion at L4-5 on 02/08/2024, readmission with HA, dural tear (repaired)  and Meningitis with AIR stay, presents with nausea and vomiting, as well as myoclonic jerking observed by family    PT Comments  Pt tolerated treatment well today. Pt with similar presentation to previous session. Able to ambulate in hallway with RW at supervision level. No change in DC/DME recs at this time. PT will continue to follow.     If plan is discharge home, recommend the following: A little help with walking and/or transfers;A lot of help with bathing/dressing/bathroom;Assistance with cooking/housework;Help with stairs or ramp for entrance   Can travel by private vehicle        Equipment Recommendations  None recommended by PT    Recommendations for Other Services       Precautions / Restrictions Precautions Precautions: Back;Fall Precaution Booklet Issued: No Recall of Precautions/Restrictions: Intact Precaution/Restrictions Comments: able to verbalize 3/3 precautions Other Brace: no brace needed per orders Restrictions Weight Bearing Restrictions Per Provider Order: No     Mobility  Bed Mobility               General bed mobility comments: OOB at sink    Transfers                   General transfer comment: Pt up at sink brushing teeth.    Ambulation/Gait Ambulation/Gait assistance: Supervision Gait Distance (Feet): 400 Feet Assistive device: Rolling walker (2 wheels) Gait Pattern/deviations: Step-through pattern, Decreased stride length Gait velocity: decreased     General Gait Details: Min cues for upward gaze, but overall good posture and steady with RW   Stairs             Wheelchair Mobility     Tilt Bed     Modified Rankin (Stroke Patients Only)       Balance Overall balance assessment: Needs assistance Sitting-balance support: Feet supported Sitting balance-Leahy Scale: Good       Standing balance-Leahy Scale: Fair Standing balance comment: able to complete some ADLs in standing but fatigued easily                            Communication Communication Communication: No apparent difficulties  Cognition Arousal: Alert Behavior During Therapy: WFL for tasks assessed/performed   PT - Cognitive impairments: No apparent impairments                         Following commands: Intact      Cueing Cueing Techniques: Verbal cues, Gestural cues  Exercises      General Comments General comments (skin integrity, edema, etc.): VSS      Pertinent Vitals/Pain Pain Assessment Pain Assessment: No/denies pain    Home Living                          Prior Function            PT Goals (current goals can now be found in the care plan section) Acute Rehab PT Goals Potential to Achieve Goals: Good    Frequency    Min 2X/week      PT Plan  Co-evaluation              AM-PAC PT 6 Clicks Mobility   Outcome Measure  Help needed turning from your back to your side while in a flat bed without using bedrails?: None Help needed moving from lying on your back to sitting on the side of a flat bed without using bedrails?: None Help needed moving to and from a bed to a chair (including a wheelchair)?: None Help needed standing up from a chair using your arms (e.g., wheelchair or bedside chair)?: None Help needed to walk in hospital room?: A Little Help needed climbing 3-5 steps with a railing? : A Little 6 Click Score: 22    End of Session Equipment Utilized During Treatment: Gait belt Activity Tolerance: Patient tolerated treatment well Patient left: in chair;with call bell/phone within reach;with chair alarm set Nurse  Communication: Mobility status PT Visit Diagnosis: Unsteadiness on feet (R26.81);Muscle weakness (generalized) (M62.81)     Time: 8571-8561 PT Time Calculation (min) (ACUTE ONLY): 10 min  Charges:    $Gait Training: 8-22 mins PT General Charges $$ ACUTE PT VISIT: 1 Visit                     Sueellen NOVAK, PT, DPT Acute Rehab Services [phone]    Christoffer Currier 03/25/2024, 3:40 PM

## 2024-03-25 NOTE — Evaluation (Signed)
 RT Evaluate and Treat Note  03/25/2024   Breathing is (select one): Same as normal    The following was found on auscultation (select multiple):  Bilateral Breath Sounds: Diminished;Clear (03/25/24 1053)  R Upper  Breath Sounds: Clear (03/25/24 1053) L Upper Breath Sounds: Clear (03/25/24 1053) R Lower Breath Sounds: Diminished (03/25/24 1053) L Lower Breath Sounds: Diminished (03/25/24 1053)    Cough Assessment: Cough: Non-productive (03/25/24 1053)    Most Recent Chest Xray:... (No results found.    The following medications and/or interventions were ordered/changed/discontinued as part of the Respiratory Treatment protocol:   Medication Changes: None   Airway Clearance Changes: None   Oxygen Therapy Changes: None

## 2024-03-25 NOTE — Progress Notes (Signed)
 [facility]  Date of Admission:  03/22/2024     Reason for Follow Up: AKI (acute kidney injury)  Total days of antibiotics 4         ASSESSMENT:  Diane Watkins is a 73 year old female with recent history of Enterobacter cloacae bacteremia after L4-L5 fusion in August 2025 status post exploration and closure with plan for 8 weeks of IV cefepime  admitted with worsening nausea, vomiting, and onset of tremors/jerking motions and found to have acute kidney injury and concern for cefepime  CNS toxicity.  Antibiotics changed to ertapenem.   Diane Watkins had tremor today primarily on the right side during exam and remains at baseline from cognitive perspective. Suspect these are residual effects of cefepime  in the setting of reduced renal function and anticipate improvements as cefepime  continues to clear. Nephrology following renal status. Discussed plan of care to continue with current dose of ertapenem. PICC line functioning and continue care per protocol. Will continue to monitor for renal improvement and any additional confusion or other CNS symptoms. Remaining medical and supportive care per Internal Medicine.   PLAN:  Continue current dose of Ertapenem. Monitor renal function and neurological status. Acute kidney injury management per Nephrology. PICC line care per protocol. Remaining medical and supportive care per Internal Medicine.  Dr. Luiz is available over the weekend for any ID related questions.   Principal Problem:   AKI (acute kidney injury) Active Problems:   Essential hypertension, benign   Hyperlipidemia   Controlled type 2 diabetes mellitus without complication, without long-term current use of insulin  (HCC)   S/P lumbar fusion   Nausea and vomiting   History of bacteremia - enterobacter cloacae from 02-20-2024.   Obesity (BMI 30-39.9)    Chlorhexidine  Gluconate Cloth  6 each Topical Q0600   heparin  5,000 Units Subcutaneous Q8H   iron  polysaccharides  150 mg Oral Daily   metoprolol  tartrate  12.5 mg Oral BID   pantoprazole  (PROTONIX ) IV  40 mg Intravenous Q24H   rosuvastatin   40 mg Oral QPM   sodium bicarbonate  650 mg Oral BID   sodium chloride  flush  3 mL Intravenous Q12H    SUBJECTIVE:  Afebrile overnight with no acute events. Having some tremor in her right hand but appears to wax and wane. Improved since admission. Tolerating antibiotics. PICC line functioning. Anxious about antibiotics.   Allergies  Allergen Reactions   Amoxil [Amoxicillin] Other (See Comments)    Bad headaches Has patient had a PCN reaction causing immediate rash, facial/tongue/throat swelling, SOB or lightheadedness with hypotension: No Has patient had a PCN reaction causing severe rash involving mucus membranes or skin necrosis: No Has patient had a PCN reaction that required hospitalization: No Has patient had a PCN reaction occurring within the last 10 years: No If all of the above answers are NO, then may proceed with Cephalosporin use.    Oxycodone -Acetaminophen  Itching     Review of Systems: Review of Systems  Constitutional:  Negative for chills, fever and weight loss.  Respiratory:  Negative for cough, shortness of breath and wheezing.   Cardiovascular:  Negative for chest pain and leg swelling.  Gastrointestinal:  Negative for abdominal pain, constipation, diarrhea, nausea and vomiting.  Skin:  Negative for rash.  Neurological:  Positive for tremors.      OBJECTIVE: Vitals:   03/25/24 0413 03/25/24 0416 03/25/24 0846 03/25/24 1053  BP: (!) 144/68 (!) 169/95 (!) 149/70   Pulse: 76 81 72 72  Resp: 18  19 18   Temp: 98.4 F (36.9 C) 98.4 F (36.9 C) 98.3 F (36.8 C)   TempSrc: Oral Oral    SpO2: 100% 100% 99% 100%  Weight:      Height:       Body mass index is 37.22 kg/m.  Physical Exam Constitutional:      General: She is not in acute distress.    Appearance: She is well-developed.  Cardiovascular:      Rate and Rhythm: Normal rate and regular rhythm.     Heart sounds: Normal heart sounds.  Pulmonary:     Effort: Pulmonary effort is normal.     Breath sounds: Normal breath sounds.  Skin:    General: Skin is warm and dry.  Neurological:     Mental Status: She is alert and oriented to person, place, and time.  Psychiatric:        Mood and Affect: Mood normal.        Behavior: Behavior normal.     Lab Results Lab Results  Component Value Date   WBC 7.6 03/25/2024   HGB 8.6 (L) 03/25/2024   HCT 26.4 (L) 03/25/2024   MCV 94.0 03/25/2024   PLT 192 03/25/2024    Lab Results  Component Value Date   CREATININE 3.52 (H) 03/25/2024   BUN 51 (H) 03/25/2024   NA 139 03/25/2024   K 4.6 03/25/2024   CL 111 03/25/2024   CO2 18 (L) 03/25/2024    Lab Results  Component Value Date   ALT 25 03/22/2024   AST 27 03/22/2024   ALKPHOS 47 03/22/2024   BILITOT 0.5 03/22/2024     Microbiology: Recent Results (from the past 240 hours)  Culture, blood (Routine X 2) w Reflex to ID Panel     Status: None (Preliminary result)   Collection Time: 03/22/24  8:50 PM   Specimen: BLOOD LEFT HAND  Result Value Ref Range Status   Specimen Description BLOOD LEFT HAND  Final   Special Requests   Final    BOTTLES DRAWN AEROBIC AND ANAEROBIC Blood Culture adequate volume   Culture   Final    NO GROWTH 3 DAYS Performed at [facility], [address], [place], [place] [address]    Report Status PENDING  Incomplete  Culture, blood (Routine X 2) w Reflex to ID Panel     Status: None (Preliminary result)   Collection Time: 03/22/24  9:21 PM   Specimen: BLOOD LEFT HAND  Result Value Ref Range Status   Specimen Description BLOOD LEFT HAND  Final   Special Requests   Final    BOTTLES DRAWN AEROBIC AND ANAEROBIC Blood Culture adequate volume   Culture   Final    NO GROWTH 3 DAYS Performed at [facility], [address] [place], [place] [address]    Report Status PENDING  Incomplete     I have personally spent 36 minutes involved in face-to-face and non-face-to-face activities for this patient on the day of the visit. Professional time spent includes the following activities: preparing to see the patient (review of tests), performing a medically appropriate examination, ordering medications, communicating with other health care professionals, documenting clinical information in the EMR, communicating results and counseling patient regarding medication and plan of care, and care coordination.    Greg Jackye Dever, NP [facility]  03/25/2024  3:18 PM

## 2024-03-25 NOTE — Plan of Care (Signed)
°  Problem: Education: °Goal: Knowledge of General Education information will improve °Description: Including pain rating scale, medication(s)/side effects and non-pharmacologic comfort measures °Outcome: Progressing °  °Problem: Clinical Measurements: °Goal: Respiratory complications will improve °Outcome: Progressing °  °Problem: Activity: °Goal: Risk for activity intolerance will decrease °Outcome: Progressing °  °Problem: Elimination: °Goal: Will not experience complications related to bowel motility °Outcome: Progressing °  °

## 2024-03-26 DIAGNOSIS — Z87898 Personal history of other specified conditions: Secondary | ICD-10-CM | POA: Diagnosis not present

## 2024-03-26 DIAGNOSIS — N179 Acute kidney failure, unspecified: Secondary | ICD-10-CM | POA: Diagnosis not present

## 2024-03-26 DIAGNOSIS — Z981 Arthrodesis status: Secondary | ICD-10-CM | POA: Diagnosis not present

## 2024-03-26 LAB — CBC
HCT: 25.5 % — ABNORMAL LOW (ref 36.0–46.0)
Hemoglobin: 8.4 g/dL — ABNORMAL LOW (ref 12.0–15.0)
MCH: 30.8 pg (ref 26.0–34.0)
MCHC: 32.9 g/dL (ref 30.0–36.0)
MCV: 93.4 fL (ref 80.0–100.0)
Platelets: 181 K/uL (ref 150–400)
RBC: 2.73 MIL/uL — ABNORMAL LOW (ref 3.87–5.11)
RDW: 14.4 % (ref 11.5–15.5)
WBC: 7.6 K/uL (ref 4.0–10.5)
nRBC: 0 % (ref 0.0–0.2)

## 2024-03-26 LAB — RENAL FUNCTION PANEL
Albumin: 2.3 g/dL — ABNORMAL LOW (ref 3.5–5.0)
Anion gap: 8 (ref 5–15)
BUN: 46 mg/dL — ABNORMAL HIGH (ref 8–23)
CO2: 18 mmol/L — ABNORMAL LOW (ref 22–32)
Calcium: 8.7 mg/dL — ABNORMAL LOW (ref 8.9–10.3)
Chloride: 112 mmol/L — ABNORMAL HIGH (ref 98–111)
Creatinine, Ser: 3.2 mg/dL — ABNORMAL HIGH (ref 0.44–1.00)
GFR, Estimated: 15 mL/min — ABNORMAL LOW (ref >60)
Glucose, Bld: 99 mg/dL (ref 70–99)
Phosphorus: 4.1 mg/dL (ref 2.5–4.6)
Potassium: 4.6 mmol/L (ref 3.5–5.1)
Sodium: 138 mmol/L (ref 135–145)

## 2024-03-26 MED ORDER — AMLODIPINE BESYLATE 10 MG PO TABS
10.0000 mg | ORAL_TABLET | Freq: Every day | ORAL | Status: DC
  Administered 2024-03-27 – 2024-03-28 (×2): 10 mg via ORAL
  Filled 2024-03-26 (×2): qty 1

## 2024-03-26 MED ORDER — ORAL CARE MOUTH RINSE: 15.0000 mL | OROMUCOSAL | Status: DC | PRN

## 2024-03-26 NOTE — Progress Notes (Signed)
 [facility] Progress Note   Subjective:   Seen in room, frontal HA today.  Confusion improving, no other new symptoms. No LUTs. I/Os 2.5 / 0.450 all UOP.  Cr improved to 3.2 today  Objective Vitals:   03/25/24 2028 03/26/24 0406 03/26/24 0737 03/26/24 0747  BP: (!) 170/75 (!) 172/83 (!) 170/79   Pulse: 67 70 69 72  Resp: 18 18 18 18   Temp: 98.7 F (37.1 C) 98 F (36.7 C) 98.3 F (36.8 C)   TempSrc: Oral Oral    SpO2: 100% 98% 99% 100%  Weight:      Height:       Physical Exam General: comfortable in chair  Heart:RRR no rub Lungs: clear on RA Extremities: trace -1+ LE edema - says it was 'much worse' previously Neuro: AOX3, more fluidly conversant today   Additional Objective Labs: Basic Metabolic Panel: Recent Labs  Lab 03/22/24 1518 03/23/24 0157 03/24/24 1812 03/25/24 0249 03/26/24 0020  NA 141   < > 137 139 138  K 4.6   < > 5.0 4.6 4.6  CL 111   < > 108 111 112*  CO2 20*   < > 18* 18* 18*  GLUCOSE 102*   < > 139* 93 99  BUN 53*   < > 51* 51* 46*  CREATININE 2.53*   < > 3.49* 3.52* 3.20*  CALCIUM  9.7   < > 9.4 8.8* 8.7*  PHOS 3.9  --   --  4.0 4.1   < > = values in this interval not displayed.   Liver Function Tests: Recent Labs  Lab 03/22/24 1518 03/25/24 0249 03/26/24 0020  AST 27  --   --   ALT 25  --   --   ALKPHOS 47  --   --   BILITOT 0.5  --   --   PROT 5.9*  --   --   ALBUMIN  2.6* 2.2* 2.3*   No results for input(s): LIPASE, AMYLASE in the last 168 hours. CBC: Recent Labs  Lab 03/22/24 1518 03/23/24 0157 03/24/24 1812 03/25/24 0249 03/26/24 0020  WBC 7.2 7.3 8.1 7.6 7.6  NEUTROABS 4.8  --   --   --   --   HGB 9.8* 8.5* 10.2* 8.6* 8.4*  HCT 30.2* 26.3* 31.2* 26.4* 25.5*  MCV 95.9 93.6 93.1 94.0 93.4  PLT 197 183 226 192 181   Blood Culture    Component Value Date/Time   SDES BLOOD LEFT HAND 03/22/2024 2121   SPECREQUEST  03/22/2024 2121    BOTTLES DRAWN AEROBIC AND ANAEROBIC Blood Culture adequate volume    CULT  03/22/2024 2121    NO GROWTH 4 DAYS Performed at [facility], [address], [place], [place] [address]    REPTSTATUS PENDING 03/22/2024 2121    Cardiac Enzymes: Recent Labs  Lab 03/22/24 2050  CKTOTAL 77   CBG: No results for input(s): GLUCAP in the last 168 hours. Iron Studies: No results for input(s): IRON, TIBC, TRANSFERRIN, FERRITIN in the last 72 hours. @lablastinr3 @ Studies/Results: No results found. Medications:  ertapenem 500 mg (03/25/24 1130)    amLODipine  5 mg Oral Daily   Chlorhexidine  Gluconate Cloth  6 each Topical Q0600   heparin  5,000 Units Subcutaneous Q8H   iron polysaccharides  150 mg Oral Daily   metoprolol  tartrate  12.5 mg Oral BID   pantoprazole  (PROTONIX ) IV  40 mg Intravenous Q24H   rosuvastatin   40 mg Oral QPM  sodium bicarbonate  650 mg Oral BID   sodium chloride  flush  3 mL Intravenous Q12H   Assessment/Plan:     # AKI  - Suspected AIN from cefepime  and is compounded by pre-renal insults with lisinopril  use and nausea/reduced intake.  She has no documented hypotension. Bladder scan 10/9 ok.  Baseline Cr less than 1 -cefepime  has been stopped and cr plateaued yesterday and improved today - po intake improving, will hold on IVF today - Cont strict I/Os daily weights   - expect improvement with continued supportive care - Cont holding home lisinopril  until AKI resolved - Defer empiric prednisone  for now given improving after abx changed   # AMS: improved - Agree with transition off of cefepime  as may be related - Gabapentin  on hold too --> she previously took for HA and she does have a HA.  I think a GFR adjusted dose would be fine to retry but she wishes to hold for now   # Metabolic acidosis - Cont oral bicarbonate for now, stop when serum bicarb normal   # Normocytic Anemia  - stable in the 8s  - Recent iron panel with mild iron deficiency - Cont oral iron OTC   # Enterobacter bacteremia - Positive on  9/6 blood cultures - Abx per ID and primary team  - Previously on cefepime  and has been transitioned to ertapenem   # HTN  - running 170s in past 24h -cont to hold ACEi in light of AKI -increase amlodipine to 10 on 10/11   Nothing further to add.  Expect her kidney function will slowly return to normal.  Can f/u with PCP and if CKD after this insult can be referred to outpt nephrology f/u.  Call us  back if we can further assist here.   Manuelita Barters MD 03/26/2024, 9:18 AM  [facility] Pager: [phone]

## 2024-03-26 NOTE — Plan of Care (Signed)
   Problem: Education: Goal: Knowledge of General Education information will improve Description: Including pain rating scale, medication(s)/side effects and non-pharmacologic comfort measures Outcome: Completed/Met

## 2024-03-26 NOTE — Progress Notes (Signed)
 Progress Note   Patient: Diane Watkins FMW:[id number] DOB: 08/27/1950 DOA: 03/22/2024     4 DOS: the patient was seen and examined on 03/26/2024   Brief hospital course: CC: dehydration HPI: Diane Watkins is a 73 y.o. female with medical history significant of hypertension, hyperlipidemia, diabetes mellitus type 2, arthritis, and recent transforaminal lumbar decompression and fusion at L4-5 on 02/08/2024 presents with nausea and vomiting.   She was just recently hospitalized from 8/31-9/10 after presenting with headache following her recent surgical procedure.  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  Patient underwent MRI which revealed persistent leak on 9/1 and underwent exploration of the lumbar wound with closure of dural defect by Dr. Joshua on 9/3.  Blood cultures from 9/6 positive for Enterobacter Cloacae for which ID was consulted and recommended 8 weeks of IV antibiotics for which a PICC line was placed on 9/9.  She went to rehab following her hospitalization.     She reports that the nausea and vomiting started last night after eating and drinking, but also mentions having a pattern of eating and then experiencing nausea for a couple of days. She has not had a chance to eat or drink anything today. No stomach pain or diarrhea. She confirms urination without discomfort and has not experienced any recent falls. No fevers have been noted.   Her husband, who lives with her, has been eating the same food but has not experienced similar symptoms. She mentions taking a medication similar to omeprazole, prescribed for stomach issues. Occasional shortness of breath is reported.   Over the phone patient's husband notes that she has been a little altered at times and talking out of her head.   In the ED patient was noted to be afebrile with heart rates elevated up to 115, and all other vital signs maintained.  Labs significant for hemoglobin 9.8, BUN 53, and creatinine 2.53.  CT  scan of the head did not reveal any acute intracranial abnormality.  CT scan of the abdomen and pelvis noted no acute abnormality with posterior rod and pedicle screw fixation at L4-L5 with stranding density posterior likely related to recent surgery with no discrete fluid collection appreciated.  Patient was bolused 2 L of IV fluids and Zofran .  TRH consulted to admit.  Assessment and Plan:  # AKI Patient presented with creatinine elevated to 2.53 with BUN 53.  Baseline creatinine around 0.8.  Received 2 L of IV fluids in the ED.  - UA negative for casts - Cr improved to 3.20 today - Thought to be most likely due to combination of dehydration in the setting of poor oral intake, nausea, and vomiting, the possibility of interstitial nephritis from cefepime . However, not adequately responding to fluids. - FeNa (10/10) - 5.1%  - PVR negative for urinary retention - Nephrology, Dr. Jerrye, evaluated patient - indicated AKI possibly due to AIN from cefepime  with prerenal insult from lisinopril  and hypovolemia - Urine protein/creatinine ratio - pending - Stopped IVF, encouraging PO intake  #NAGMA - Bicarb 18, corrected AG 14.5 - Continue PO bicarb  # Nausea and vomiting -- resolved Patient presented with symptoms of nausea vomiting for few days.  Also noted her husband ate similar food without any issue. - CT abdomen pelvis showed no acute intra-abdominal abnormalities to explain the patient's symptoms - Reports improvement, tolerating regular diet - PRN Zofran   # Acute toxic metabolic encephalopathy - resolved - Patient's husband reported some confusion.  - CT head negative for  acute intracranial findings - Etiology most likely multifactorial in the setting of acute renal failure complicated by cefepime  neurotoxicity, with improvement after discontinuation of cefepime  - Delirium precautions - Gabapentin  discontinued  # History of Enterobacter cloacae bacteremia - Diagnosed with  Enterobacter cloacae bacteremia on 02/20/2024. Evaluated by ID with recommendations for 8 weeks of IV antibiotics for which a right upper extremity PICC line was placed on 02/23/2024 with the patient discharged on IV cefepime  - ID consulted due to concerns for cefepime  toxicity - antibiotics were initially revised to meropenem now changed to ertapenem - BCx (03/22/2024) showing no growth to date - Continue IV ertapenem  # Status post lumbar fusion Patient with history of CSF leak after decompression and fusion surgery at L4-L5 on 8/25.  Patient underwent exploration of lumbar wound with closure of dural deficit on 9/3 after MRI noted findings suggestive of a CSF leak.  CT scan of the abdomen pelvis noted posterior rod and pedicle screw fixation at L4-L5 but strandy density posteriorly thought to likely be related to the recent surgery with no discrete fluid collection present.  # Normocytic anemia - Hemoglobin stable, baseline - Continue PO Fe supplementation   #NID-T2DM - Hemoglobin A1c 7 (02/03/2024) - Stable  #Hypertension - BP is elevated today with systolic in the 140s to 170s - Home lisinopril  held due to AKI - Continue metoprolol  12.5 mg twice daily - Increased amlodipine tp 10 mg daily - PRN IV hydralazine 10 mg every 6 hours for SBP >160 and/or DBP > 110  #Hyperlipidemia - Continue home atorvastatin    Subjective: Patient seen at bedside.  No acute events overnight per patient or RN staff.  Patient was in good spirits today due to decline in creatinine. Continues to report poor PO intake, states she has no appetite for the food being served, especially after being placed on a renal diet. Encouraged her to take in as much as she can tolerate. Denies any chest pain, SOB, nausea, vomiting, abdominal pain, fevers, chills.   Physical Exam: Vitals:   03/26/24 1351 03/26/24 1624 03/26/24 1932 03/26/24 1955  BP: (!) 149/75 (!) 170/79 (!) 151/73 120/70  Pulse: 66 65 69 70  Resp: 16 18 18  18   Temp:  98.4 F (36.9 C) 98.2 F (36.8 C) 98.2 F (36.8 C)  TempSrc:  Oral Oral Oral  SpO2:  100% 98% 97%  Weight:      Height:       Physical Exam Constitutional:      General: She is not in acute distress.    Appearance: She is not ill-appearing.  HENT:     Mouth/Throat:     Mouth: Mucous membranes are moist.  Eyes:     Pupils: Pupils are equal, round, and reactive to light.  Cardiovascular:     Rate and Rhythm: Normal rate and regular rhythm.     Heart sounds: Normal heart sounds. No murmur heard. Pulmonary:     Effort: Pulmonary effort is normal. No respiratory distress.     Breath sounds: Normal breath sounds. No wheezing.  Abdominal:     General: Bowel sounds are normal. There is no distension.     Palpations: Abdomen is soft.     Tenderness: There is no abdominal tenderness. There is no guarding.  Musculoskeletal:     Right lower leg: Edema (trace) present.     Left lower leg: Edema (trace) present.     Comments: RUE PICC line in place  Skin:    General:  Skin is warm and dry.     Capillary Refill: Capillary refill takes less than 2 seconds.  Neurological:     Mental Status: She is alert and oriented to person, place, and time.    Family Communication: Discussed with patient, her husband Diane Watkins, and her daughter-in-law  Disposition: Status is: Inpatient Remains inpatient appropriate because: Pending improvement in renal function  Planned Discharge Destination: Home with Home Health  DVT prophylaxis: heparin injection 5,000 Units Start: 03/22/24 2200  Time spent: 35 minutes  Author: Duffy Larch, MD 03/26/2024 8:19 PM  For on call review [email or web address].

## 2024-03-26 NOTE — Progress Notes (Signed)
 Mobility Specialist Progress Note:    03/26/24 1144  Mobility  Activity Ambulated with assistance  Level of Assistance Standby assist, set-up cues, supervision of patient - no hands on  Assistive Device Front wheel walker  Distance Ambulated (ft) 400 ft  Activity Response Tolerated well  Mobility Referral Yes  Mobility visit 1 Mobility  Mobility Specialist Start Time (ACUTE ONLY) 1020  Mobility Specialist Stop Time (ACUTE ONLY) 1033  Mobility Specialist Time Calculation (min) (ACUTE ONLY) 13 min   Received pt in chair having no complaints and agreeable to mobility. Pt was asymptomatic throughout ambulation and returned to room w/o fault. Left in chair w/ call bell in reach and all needs met.   Thersia Minder Mobility Specialist  Please contact vis Secure Chat or  Rehab Office [phone]

## 2024-03-26 NOTE — Evaluation (Signed)
 RT Evaluate and Treat Note  03/26/2024   Breathing is (select one): Same as normal    The following was found on auscultation (select multiple):  Bilateral Breath Sounds: Clear;Diminished (03/26/24 0747)  R Upper  Breath Sounds: Clear;Diminished (03/26/24 0747) L Upper Breath Sounds: Clear;Diminished (03/26/24 0747) R Lower Breath Sounds: Clear;Diminished (03/26/24 0747) L Lower Breath Sounds: Clear;Diminished (03/26/24 0747)    Cough Assessment: Cough: Non-productive (03/26/24 0747)    Most Recent Chest Xray:... (No results found.  Last CXR taken on 03/22/2024 shows clear lungs.    The following medications and/or interventions were ordered/changed/discontinued as part of the Respiratory Treatment protocol:   Medication Changes:  None   Airway Clearance Changes:  None   Oxygen Therapy Changes: None

## 2024-03-27 ENCOUNTER — Encounter (HOSPITAL_COMMUNITY): Payer: Self-pay | Admitting: Internal Medicine

## 2024-03-27 DIAGNOSIS — N179 Acute kidney failure, unspecified: Secondary | ICD-10-CM | POA: Diagnosis not present

## 2024-03-27 LAB — CULTURE, BLOOD (ROUTINE X 2)
Culture: NO GROWTH
Culture: NO GROWTH
Special Requests: ADEQUATE
Special Requests: ADEQUATE

## 2024-03-27 LAB — RENAL FUNCTION PANEL
Albumin: 2.4 g/dL — ABNORMAL LOW (ref 3.5–5.0)
Anion gap: 12 (ref 5–15)
BUN: 41 mg/dL — ABNORMAL HIGH (ref 8–23)
CO2: 20 mmol/L — ABNORMAL LOW (ref 22–32)
Calcium: 9 mg/dL (ref 8.9–10.3)
Chloride: 112 mmol/L — ABNORMAL HIGH (ref 98–111)
Creatinine, Ser: 3.18 mg/dL — ABNORMAL HIGH (ref 0.44–1.00)
GFR, Estimated: 15 mL/min — ABNORMAL LOW (ref >60)
Glucose, Bld: 97 mg/dL (ref 70–99)
Phosphorus: 4.6 mg/dL (ref 2.5–4.6)
Potassium: 4.5 mmol/L (ref 3.5–5.1)
Sodium: 144 mmol/L (ref 135–145)

## 2024-03-27 LAB — CBC
HCT: 25.6 % — ABNORMAL LOW (ref 36.0–46.0)
Hemoglobin: 8.4 g/dL — ABNORMAL LOW (ref 12.0–15.0)
MCH: 30.9 pg (ref 26.0–34.0)
MCHC: 32.8 g/dL (ref 30.0–36.0)
MCV: 94.1 fL (ref 80.0–100.0)
Platelets: 184 K/uL (ref 150–400)
RBC: 2.72 MIL/uL — ABNORMAL LOW (ref 3.87–5.11)
RDW: 14.7 % (ref 11.5–15.5)
WBC: 7.6 K/uL (ref 4.0–10.5)
nRBC: 0 % (ref 0.0–0.2)

## 2024-03-27 NOTE — Plan of Care (Signed)
  Problem: Clinical Measurements: Goal: Will remain free from infection Outcome: Not Progressing   Problem: Clinical Measurements: Goal: Diagnostic test results will improve Outcome: Not Progressing   Problem: Safety: Goal: Ability to remain free from injury will improve Outcome: Not Progressing   Problem: Pain Managment: Goal: General experience of comfort will improve and/or be controlled Outcome: Not Progressing

## 2024-03-27 NOTE — Progress Notes (Signed)
 Progress Note   Patient: Diane Watkins FMW:[id number] DOB: [birth year] DOA: 03/22/2024     5 DOS: the patient was seen and examined on 03/27/2024   Brief hospital course: CC: dehydration HPI: Diane Watkins is a 73 y.o. female with medical history significant of hypertension, hyperlipidemia, diabetes mellitus type 2, arthritis, and recent transforaminal lumbar decompression and fusion at L4-5 on 02/08/2024 presents with nausea and vomiting.   She was just recently hospitalized from 8/31-9/10 after presenting with headache following her recent surgical procedure.  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  Patient underwent MRI which revealed persistent leak on 9/1 and underwent exploration of the lumbar wound with closure of dural defect by Dr. Joshua on 9/3.  Blood cultures from 9/6 positive for Enterobacter Cloacae for which ID was consulted and recommended 8 weeks of IV antibiotics for which a PICC line was placed on 9/9.  She went to rehab following her hospitalization.     She reports that the nausea and vomiting started last night after eating and drinking, but also mentions having a pattern of eating and then experiencing nausea for a couple of days. She has not had a chance to eat or drink anything today. No stomach pain or diarrhea. She confirms urination without discomfort and has not experienced any recent falls. No fevers have been noted.   Her husband, who lives with her, has been eating the same food but has not experienced similar symptoms. She mentions taking a medication similar to omeprazole, prescribed for stomach issues. Occasional shortness of breath is reported.   Over the phone patient's husband notes that she has been a little altered at times and talking out of her head.   In the ED patient was noted to be afebrile with heart rates elevated up to 115, and all other vital signs maintained.  Labs significant for hemoglobin 9.8, BUN 53, and creatinine 2.53.  CT  scan of the head did not reveal any acute intracranial abnormality.  CT scan of the abdomen and pelvis noted no acute abnormality with posterior rod and pedicle screw fixation at L4-L5 with stranding density posterior likely related to recent surgery with no discrete fluid collection appreciated.  Patient was bolused 2 L of IV fluids and Zofran .  TRH consulted to admit.  Assessment and Plan:  # AKI - improving Patient presented with creatinine elevated to 2.53 with BUN 53.  Baseline creatinine around 0.8.  Received 2 L of IV fluids in the ED.  - UA negative for casts - Cr improved to 3.18 today - Thought to be most likely due to combination of dehydration in the setting of poor oral intake, nausea, and vomiting, the possibility of interstitial nephritis from cefepime . However, not adequately responding to fluids. - FeNa (10/10) - 5.1%  - PVR negative for urinary retention - Nephrology signed off - indicated AKI possibly due to AIN from cefepime  with prerenal insult from lisinopril  and hypovolemia. Now signed off given improvement in Cr, expecting slow and gradual improvement  - Urine protein/creatinine ratio - pending - Encouraging PO intake  #NAGMA - Bicarb 20 - Continue PO bicarb  # Nausea and vomiting -- resolved Patient presented with symptoms of nausea vomiting for few days.  Also noted her husband ate similar food without any issue. - CT abdomen pelvis showed no acute intra-abdominal abnormalities to explain the patient's symptoms - Reports improvement, tolerating regular diet - PRN Zofran   # Acute toxic metabolic encephalopathy - resolved - Patient's husband reported  some confusion.  - CT head negative for acute intracranial findings - Etiology most likely multifactorial in the setting of acute renal failure complicated by cefepime  neurotoxicity, with improvement after discontinuation of cefepime  - Delirium precautions - Gabapentin  discontinued  # History of Enterobacter  cloacae bacteremia - Diagnosed with Enterobacter cloacae bacteremia on 02/20/2024. Evaluated by ID with recommendations for 8 weeks of IV antibiotics for which a right upper extremity PICC line was placed on 02/23/2024 with the patient discharged on IV cefepime  - ID consulted due to concerns for cefepime  toxicity - antibiotics were initially revised to meropenem now changed to ertapenem - BCx (03/22/2024) showing no growth to date - Continue IV ertapenem  # Status post lumbar fusion Patient with history of CSF leak after decompression and fusion surgery at L4-L5 on 8/25.  Patient underwent exploration of lumbar wound with closure of dural deficit on 9/3 after MRI noted findings suggestive of a CSF leak.  CT scan of the abdomen pelvis noted posterior rod and pedicle screw fixation at L4-L5 but strandy density posteriorly thought to likely be related to the recent surgery with no discrete fluid collection present.  # Normocytic anemia - Hemoglobin stable, baseline - Continue PO Fe supplementation   #NID-T2DM - Hemoglobin A1c 7 (02/03/2024) - Stable  #Hypertension - BP is elevated today with systolic in the 140s to 170s - Home lisinopril  held due to AKI - Continue metoprolol  12.5 mg twice daily - Continue amlodipine tp 10 mg daily - PRN IV hydralazine 10 mg every 6 hours for SBP >160 and/or DBP > 110  #Hyperlipidemia - Continue home atorvastatin    Subjective: Patient seen at bedside.  No acute events overnight per patient or RN staff.  Patient was in good spirits today due to decline in creatinine. Continues to report poor PO intake, states she has no appetite for the food being served, picked off the cucumbers in her salad. Denies any chest pain, SOB, nausea, vomiting, abdominal pain, fevers, chills.   Physical Exam: Vitals:   03/27/24 0504 03/27/24 0859 03/27/24 1700 03/27/24 1931  BP: (!) 157/71 (!) 152/72 (!) 157/84 (!) 144/68  Pulse: 69 82 79 73  Resp: 18 18  18   Temp: 98.7 F (37.1  C) 98.6 F (37 C) 98 F (36.7 C) 98.9 F (37.2 C)  TempSrc: Oral Oral Oral Oral  SpO2: 96% 100% 99% 99%  Weight:      Height:       Physical Exam Constitutional:      General: She is not in acute distress.    Appearance: She is not ill-appearing.  HENT:     Mouth/Throat:     Mouth: Mucous membranes are moist.  Eyes:     Pupils: Pupils are equal, round, and reactive to light.  Cardiovascular:     Rate and Rhythm: Normal rate and regular rhythm.     Heart sounds: Normal heart sounds. No murmur heard. Pulmonary:     Effort: Pulmonary effort is normal. No respiratory distress.     Breath sounds: Normal breath sounds. No wheezing.  Abdominal:     General: Bowel sounds are normal. There is no distension.     Palpations: Abdomen is soft.     Tenderness: There is no abdominal tenderness. There is no guarding.  Musculoskeletal:     Right lower leg: Edema (trace) present.     Left lower leg: Edema (trace) present.     Comments: RUE PICC line in place  Skin:    General:  Skin is warm and dry.     Capillary Refill: Capillary refill takes less than 2 seconds.  Neurological:     Mental Status: She is alert and oriented to person, place, and time.    Family Communication: Discussed with patient, her husband Marcey, and her daughter-in-law  Disposition: Status is: Inpatient Remains inpatient appropriate because: Pending improvement in renal function and final antibiotic EOT  Planned Discharge Destination: Home with Home Health  DVT prophylaxis: heparin injection 5,000 Units Start: 03/22/24 2200  Time spent: 35 minutes  Author: Duffy Larch, MD 03/27/2024 10:40 PM  For on call review [email or web address].

## 2024-03-27 NOTE — Evaluation (Signed)
 RT Evaluate and Treat Note  03/27/2024   Breathing is (select one): Same as normal    The following was found on auscultation (select multiple):  Bilateral Breath Sounds: Diminished (03/26/24 2053)  R Upper  Breath Sounds: Clear;Diminished (03/26/24 0747) L Upper Breath Sounds: Clear;Diminished (03/26/24 0747) R Lower Breath Sounds: Clear;Diminished (03/26/24 0747) L Lower Breath Sounds: Clear;Diminished (03/26/24 0747)    Cough Assessment: Cough: Non-productive (03/26/24 0747)    Most Recent Chest Xray:... (No results found.    The following medications and/or interventions were ordered/changed/discontinued as part of the Respiratory Treatment protocol:   Medication Changes: None   Airway Clearance Changes: None   Oxygen Therapy Changes: None

## 2024-03-27 NOTE — Progress Notes (Signed)
 Mobility Specialist Progress Note:    03/27/24 1207  Mobility  Activity Ambulated with assistance  Level of Assistance Standby assist, set-up cues, supervision of patient - no hands on  Assistive Device Front wheel walker  Distance Ambulated (ft) 200 ft  Activity Response Tolerated well  Mobility Referral Yes  Mobility visit 1 Mobility  Mobility Specialist Start Time (ACUTE ONLY) 1000  Mobility Specialist Stop Time (ACUTE ONLY) 1015  Mobility Specialist Time Calculation (min) (ACUTE ONLY) 15 min   Received pt in chair having no complaints and agreeable to mobility. Pt was asymptomatic throughout ambulation and returned to room w/o fault. Left in chair w/ call bell in reach and all needs met.   Thersia Minder Mobility Specialist  Please contact vis Secure Chat or  Rehab Office [phone]

## 2024-03-28 ENCOUNTER — Other Ambulatory Visit (HOSPITAL_COMMUNITY): Payer: Self-pay

## 2024-03-28 DIAGNOSIS — N179 Acute kidney failure, unspecified: Secondary | ICD-10-CM | POA: Diagnosis not present

## 2024-03-28 DIAGNOSIS — B9621 Shiga toxin-producing Escherichia coli [E. coli] (STEC) O157 as the cause of diseases classified elsewhere: Secondary | ICD-10-CM

## 2024-03-28 DIAGNOSIS — T8463XD Infection and inflammatory reaction due to internal fixation device of spine, subsequent encounter: Secondary | ICD-10-CM

## 2024-03-28 LAB — RENAL FUNCTION PANEL
Albumin: 2.5 g/dL — ABNORMAL LOW (ref 3.5–5.0)
Anion gap: 10 (ref 5–15)
BUN: 35 mg/dL — ABNORMAL HIGH (ref 8–23)
CO2: 21 mmol/L — ABNORMAL LOW (ref 22–32)
Calcium: 8.9 mg/dL (ref 8.9–10.3)
Chloride: 110 mmol/L (ref 98–111)
Creatinine, Ser: 2.99 mg/dL — ABNORMAL HIGH (ref 0.44–1.00)
GFR, Estimated: 16 mL/min — ABNORMAL LOW (ref >60)
Glucose, Bld: 94 mg/dL (ref 70–99)
Phosphorus: 4.1 mg/dL (ref 2.5–4.6)
Potassium: 4.4 mmol/L (ref 3.5–5.1)
Sodium: 141 mmol/L (ref 135–145)

## 2024-03-28 MED ORDER — PANTOPRAZOLE SODIUM 40 MG PO TBEC
40.0000 mg | DELAYED_RELEASE_TABLET | Freq: Every day | ORAL | Status: DC
  Administered 2024-03-28: 40 mg via ORAL
  Filled 2024-03-28: qty 1

## 2024-03-28 MED ORDER — POLYSACCHARIDE IRON COMPLEX 150 MG PO CAPS
150.0000 mg | ORAL_CAPSULE | Freq: Every day | ORAL | 0 refills | Status: DC
  Filled 2024-03-28: qty 30, 30d supply, fill #0

## 2024-03-28 MED ORDER — SODIUM BICARBONATE 650 MG PO TABS
650.0000 mg | ORAL_TABLET | Freq: Two times a day (BID) | ORAL | 0 refills | Status: AC
  Filled 2024-03-28: qty 28, 14d supply, fill #0

## 2024-03-28 MED ORDER — ERTAPENEM IV (FOR PTA / DISCHARGE USE ONLY): 500.0000 mg | INTRAVENOUS | 0 refills | Status: AC

## 2024-03-28 MED ORDER — HEPARIN SOD (PORK) LOCK FLUSH 100 UNIT/ML IV SOLN
250.0000 [IU] | INTRAVENOUS | Status: AC | PRN
  Administered 2024-03-28: 250 [IU]

## 2024-03-28 MED ORDER — AMLODIPINE BESYLATE 10 MG PO TABS
10.0000 mg | ORAL_TABLET | Freq: Every day | ORAL | 0 refills | Status: DC
  Filled 2024-03-28: qty 30, 30d supply, fill #0

## 2024-03-28 NOTE — Progress Notes (Signed)
 PHARMACY CONSULT NOTE FOR:  OUTPATIENT  PARENTERAL ANTIBIOTIC THERAPY (OPAT)  Indication: Enterobacter cloacae bacteremia/lumbar hardware infection Regimen: Ertapenem IV 500 mg Q24H End date: 04/14/2024  IV antibiotic discharge orders are pended. To discharging provider:  please sign these orders via discharge navigator,  Select New Orders & click on the button choice - Manage This Unsigned Work.     Thank you for allowing pharmacy to be a part of this patient's care.  Feliciano Close, PharmD PGY2 Infectious Diseases Pharmacy Resident  03/28/2024 2:40 PM

## 2024-03-28 NOTE — Progress Notes (Signed)
 Instructions/medications/discharge discussed in detail with patient and family member upon notification about patient's possible discharge. Denies any further needs at this time.

## 2024-03-28 NOTE — Progress Notes (Signed)
 0730: Report received from Charito, RN at this time. + established RA PICC, +Heparin, + 1 assist with walker. Possible d/c today. 1028: Patient seem sitting by window in recliner at this time. Daughter in law bedside. Patient appears sad and somber, when asked about condition, patient states she is depressed. States she is unsure about her plan of care and if her kidney function is getting better. This nurse goes over laboratory work with daughter in law bedside as family is watching trending results. Dr. Mosie notified of patient/family concerns: Persistent early morning HA since d/c of gabapentin , bilateral lower extremity weakness and edema, increased sadness r/t acute illness.  1053: ID provider and team bedside at this time   Morning Assessment:  +Sadness/tearful  + flushed appearance + 2+ bilateral radial and DP pulses + bilateral non-pitting dependent edema in lower extremities + active bowels  + no adventitious breath sounds   +weak dry cough with deep inspiratory breath  Endorses: slight HA, sadness, intermittent nausea, bilateral lower leg swelling, productive cough at night (orthopnea), dry cough with deep breathing, general malaise, previous constipation that has resolved with softeners  Denies emesis, congestion, lightheadedness/dizziness, CP/SOB, N/T, Dysuria 1225: Patient requesting tylenol  for HA at this time   1330: This nurse to bedside to find patient very tearful at this time. States she is mostly upset about her decline in independency, need for consistent IV antibiotics and HA. States she feels anxious as well. This nurse reassures patient that she is well appearing and offers off unit privileges (to promote different scene).  Patient states she would be okay with this but is mostly concerned about plan of care and d/c timeline. Dr. Mosie notified on patient's presentation and concerns. 1355: Dr. Mosie bedside  1428: The nurse to patient bedside to administer  Heparin. Patient up with therapy at this time, walking halls with front wheel walker. Patient appears to be in better spirits.   1652: IV team to patient's bedside for PICC dressing change, prior to d/c   Please utilize progressive note for this nurse assessment of patient by way of exception. This is in the event of no documentation within flowsheets   Yajayra Feldt, RN

## 2024-03-28 NOTE — Discharge Instructions (Signed)
 Continue to replenish your body with fluids to keep up your hydration level Call your PCP and make an appointment to see them in 1 week to repeat labs including electrolytes, kidney function, and bicarbonate levels as well as discuss any medication changes that occurred at discharge from this hospital Hold your home Lisinopril  until your kidney function recovers and you are instructed to resume it by your PCP Keep a daily blood pressure log - measure your blood pressure once in the morning and once at night. Take your log with you to your PCP appointment so they can make any necessary adjustments

## 2024-03-28 NOTE — TOC Transition Note (Signed)
 Transition of Care [facility]) - Discharge Note   Patient Details  Name: Diane Watkins MRN: [id number] Date of Birth: 1950/11/17  [facility] CM/SW Contact:  Tom-Johnson, Harvest Muskrat, RN Phone Number: 03/28/2024, 5:05 PM   Clinical Narrative:     Patient is scheduled for discharge today with home IV abx.  Readmission Risk Assessment done. Home health info, Outpatient f/u, hospital f/u and discharge instructions on AVS. Prescriptions sent to [facility] pharmacy and patient will receive meds prior discharge. Daughter in-law Mliss at bedside and will transport at discharge.  No further ICM needs noted.      Final next level of care: Home w Home Health Services Barriers to Discharge: Barriers Resolved   Patient Goals and CMS Choice Patient states their goals for this hospitalization and ongoing recovery are:: To return home CMS Medicare.gov Compare Post Acute Care list provided to:: Patient Choice offered to / list presented to : Patient      Discharge Placement                Patient to be transferred to facility by: Mliss Name of family member notified: Daughter in-law    Discharge Plan and Services Additional resources added to the After Visit Summary for                            [facility] Arranged: PT, OT, RN, IV Antibiotics HH Agency: [facility], [facility] Date [facility] Agency Contacted: 03/28/24 Time HH Agency Contacted: 1000 Representative spoke with at [facility] Agency: Darleene with [facility] and Pam with [employer].  Social Drivers of Health (SDOH) Interventions SDOH Screenings   Food Insecurity: No Food Insecurity (03/22/2024)  Housing: Low Risk  (03/22/2024)  Transportation Needs: No Transportation Needs (03/22/2024)  Utilities: Not At Risk (03/22/2024)  Alcohol  Screen: Low Risk  (08/03/2023)  Depression (PHQ2-9): Low Risk  (12/28/2023)  Financial Resource Strain: Low Risk  (08/03/2023)  Physical Activity: Insufficiently Active (08/03/2023)  Social  Connections: Moderately Integrated (03/22/2024)  Stress: No Stress Concern Present (08/03/2023)  Tobacco Use: Low Risk  (03/22/2024)  Health Literacy: Adequate Health Literacy (02/13/2023)     Readmission Risk Interventions    03/28/2024    5:03 PM 03/23/2024    1:01 PM 02/16/2024    3:50 PM  Readmission Risk Prevention Plan  Post Dischage Appt   Complete  Medication Screening   Complete  Transportation Screening Complete Complete Complete  PCP or Specialist Appt within 3-5 Days  Complete   HRI or Home Care Consult  Complete   Social Work Consult for Recovery Care Planning/Counseling  Complete   Palliative Care Screening  Not Applicable   Medication Review [facility]ographer) Referral to Pharmacy Referral to Pharmacy   PCP or Specialist appointment within 3-5 days of discharge Complete    HRI or Home Care Consult Complete    SW Recovery Care/Counseling Consult Complete    Palliative Care Screening Not Applicable    Skilled Nursing Facility Not Applicable

## 2024-03-28 NOTE — Plan of Care (Signed)
 [name] is well appearing and pleasant. She was a bit anxious and tearful this morning. Patient felt like she was not properly updated on her plan of care. Patient was concerned about her kidney function, and felt very anxious and sad about her decrease in independency. This nurse spent intentional time consoling patient and reassuring her of her progress. Patient seems to have adequate resources (home health) and a stable support system. Once the care team was able to update [name], she relaxed a bit. Though she still endorses a headache and decrease in appetite, she seems eager to get back to her normal. Please see progress note from this nurse for shift assessment.   Melenda Bielak, RN

## 2024-03-28 NOTE — Progress Notes (Signed)
 Physical Therapy Treatment Patient Details Name: Diane Watkins MRN: [id number] DOB: [birth year] Today's Date: 03/28/2024   History of Present Illness Diane Watkins is a 73 y.o. female with and recent transforaminal lumbar decompression and fusion at L4-5 on 02/08/2024, readmission with HA, dural tear (repaired)  and Meningitis with AIR stay, presents with nausea and vomiting, as well as myoclonic jerking observed by family    PT Comments  Continuing work on functional mobility and activity tolerance; patient has been upset most of the day, but had a chance to speak with the doctor before this session, and she seems happy that the plan was to go home today; we discussed considerations for getting up the stairs to enter her home, and assist available at Home. She was able to walk in the hallway with the walker, and showed good self monitoring of activity tolerance; OK to DC home from a PT standpoint.    If plan is discharge home, recommend the following: A little help with walking and/or transfers;A lot of help with bathing/dressing/bathroom;Assistance with cooking/housework;Help with stairs or ramp for entrance   Can travel by private vehicle        Equipment Recommendations  None recommended by PT    Recommendations for Other Services       Precautions / Restrictions Precautions Precautions: Back;Fall Precaution Booklet Issued: No Recall of Precautions/Restrictions: Intact Precaution/Restrictions Comments: able to verbalize 3/3 precautions Other Brace: no brace needed per orders Restrictions Weight Bearing Restrictions Per Provider Order: No     Mobility  Bed Mobility                   Transfers                   Stood from recliner to RW with CGA, and cues for hand placement     Ambulation/Gait Ambulation/Gait assistance: Supervision  200 Assistive device: Rolling walker (2 wheels) Gait Pattern/deviations: Step-through pattern, Decreased stride  length Gait velocity: decreased     General Gait Details: Min cues for upward gaze, but overall good posture and steady with RW   Stairs             Wheelchair Mobility     Tilt Bed    Modified Rankin (Stroke Patients Only)       Balance Overall balance assessment: Needs assistance Sitting-balance support: Feet supported Sitting balance-Leahy Scale: Good       Standing balance-Leahy Scale: Fair Standing balance comment: able to complete some ADLs in standing but fatigued easily                            Communication Communication Communication: No apparent difficulties  Cognition Arousal: Alert Behavior During Therapy: WFL for tasks assessed/performed   PT - Cognitive impairments: No apparent impairments                         Following commands: Intact      Cueing Cueing Techniques: Verbal cues, Gestural cues  Exercises      General Comments  Daughter in law, Diane Watkins, present and helfpul      Pertinent Vitals/Pain      Home Living                          Prior Function            PT Goals (current  goals can now be found in the care plan section) Acute Rehab PT Goals PT Goal Formulation: With patient Time For Goal Achievement: 04/06/24 Potential to Achieve Goals: Good Progress towards PT goals: Progressing toward goals    Frequency    Min 2X/week      PT Plan      Co-evaluation              AM-PAC PT 6 Clicks Mobility   Outcome Measure  Help needed turning from your back to your side while in a flat bed without using bedrails?: None Help needed moving from lying on your back to sitting on the side of a flat bed without using bedrails?: None Help needed moving to and from a bed to a chair (including a wheelchair)?: None Help needed standing up from a chair using your arms (e.g., wheelchair or bedside chair)?: None Help needed to walk in hospital room?: A Little Help needed climbing 3-5  steps with a railing? : A Little 6 Click Score: 22    End of Session Equipment Utilized During Treatment: Gait belt Activity Tolerance: Patient tolerated treatment well Patient left: in chair;with call bell/phone within reach;with chair alarm set Nurse Communication: Mobility status PT Visit Diagnosis: Unsteadiness on feet (R26.81);Muscle weakness (generalized) (M62.81)     Time: 8570-8545 PT Time Calculation (min) (ACUTE ONLY): 25 min  Charges:    $Gait Training: 23-37 mins PT General Charges $$ ACUTE PT VISIT: 1 Visit                     Silvano Currier, PT  Acute Rehabilitation Services Office [phone] Secure Chat welcomed    Silvano VEAR Currier 03/28/2024, 3:09 PM

## 2024-03-28 NOTE — Progress Notes (Signed)
 OT Cancellation Note  Patient Details Name: Diane Watkins MRN: [id number] DOB: [birth year]   Cancelled Treatment:    Reason Eval/Treat Not Completed: Fatigue/lethargy limiting ability to participate (Pt reporting not feeling good today but reported ok with HH therapy at this time.) Jinger Middlesworth K OTR/L  Acute Rehab Services  [phone] office number   Warrick Berber 03/28/2024, 11:58 AM

## 2024-03-28 NOTE — Progress Notes (Signed)
 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  Antibiotics changed to ertapenem.   Diane Watkins renal function continues to improve slowly with mental status remaining stable. Discussed plan of care to continue with current dose of Ertapenem for duration of treatment through 04/14/24 for Enterobacter cloacae bacteremia and lumbar hardware infection. Continue PICC line care per protocol. Home health/OPAT orders. Ok for discharge from ID standpoint and will arrange follow up in clinic. Standard/universal precautions. Remaining medical and supportive care per Internal Medicine.   PLAN:  Continue current dose of Ertapenem. Continue therapeutic drug monitoring of renal function.  Home health/OPAT orders PICC line care per protocol.  Ok for discharge from ID standpoint with follow up in ID clinic.  Remaining medical and supportive care per Internal Medicine.   Diagnosis:  Enterobacter bacteremia and post-surgical infection   Culture Result: Enterobacter cloacae   Allergies  Allergen Reactions   Amoxil [Amoxicillin] Other (See Comments)    Bad headaches Has patient had a PCN reaction causing immediate rash, facial/tongue/throat swelling, SOB or lightheadedness with hypotension: No Has patient had a PCN reaction causing severe rash involving mucus membranes or skin necrosis: No Has patient had a PCN reaction that required hospitalization: No Has patient had a PCN reaction occurring within the last 10 years:  No If all of the above answers are NO, then may proceed with Cephalosporin use.    Oxycodone -Acetaminophen  Itching    OPAT Orders Discharge antibiotics to be given via PICC line Discharge antibiotics: Ertapenem 500 mg IV q 24 Per pharmacy protocol   Duration:  2.5 weeks   End Date: 04/14/24  [facility] Care Per Protocol:  Home health RN for IV administration and teaching; PICC line care and labs.    Labs weekly while on IV antibiotics: _X_ CBC with differential __ BMP _X_ CMP _X_ CRP _X_ ESR __ Vancomycin  trough __ CK  __ Please pull PIC at completion of IV antibiotics __ Please leave PIC in place until doctor has seen patient or been notified  Fax weekly labs to [phone]  Clinic Follow Up Appt:  04/13/24 at 1:45 pm with Dr. Dennise     Principal Problem:   AKI (acute kidney injury) Active Problems:   Essential hypertension, benign   Hyperlipidemia   Controlled type 2 diabetes mellitus without complication, without long-term current use of insulin  (HCC)   S/P lumbar fusion   Nausea and vomiting   History of bacteremia - enterobacter cloacae from 02-20-2024.   Obesity (BMI 30-39.9)    amLODipine  10 mg Oral Daily   Chlorhexidine  Gluconate Cloth  6 each Topical Q0600   heparin  5,000 Units Subcutaneous Q8H   iron polysaccharides  150 mg Oral Daily   metoprolol  tartrate  12.5 mg Oral BID   pantoprazole   40 mg Oral Daily   rosuvastatin   40 mg Oral QPM   sodium bicarbonate  650 mg Oral BID   sodium chloride  flush  3 mL Intravenous Q12H    SUBJECTIVE:  Afebrile overnight with no acute events. Tolerating antibiotics with no adverse side effects.  Allergies  Allergen Reactions   Amoxil [Amoxicillin] Other (See Comments)    Bad headaches Has patient had a PCN reaction causing immediate rash, facial/tongue/throat swelling, SOB or lightheadedness with hypotension: No Has patient had a PCN reaction causing severe rash involving mucus membranes or skin  necrosis: No Has patient had a PCN reaction that required hospitalization: No Has patient had a PCN reaction occurring within the last 10 years: No If all of the above answers are NO, then may proceed with Cephalosporin use.    Oxycodone -Acetaminophen  Itching     Review of Systems: Review of Systems  Constitutional:  Negative for chills, fever and weight loss.  Respiratory:  Negative for cough, shortness of breath and wheezing.   Cardiovascular:  Negative for chest pain and leg swelling.  Gastrointestinal:  Negative for abdominal pain, constipation, diarrhea, nausea and vomiting.  Skin:  Negative for rash.      OBJECTIVE: Vitals:   03/28/24 1100 03/28/24 1147 03/28/24 1148 03/28/24 1322  BP: (!) 166/75 (!) 163/74 (!) 163/74 (!) 147/68  Pulse:    85  Resp:    18  Temp:    98.4 F (36.9 C)  TempSrc:    Oral  SpO2:    99%  Weight:      Height:       Body mass index is 38.15 kg/m.  Physical Exam Constitutional:      General: She is not in acute distress.    Appearance: She is well-developed.  Cardiovascular:     Rate and Rhythm: Normal rate and regular rhythm.     Heart sounds: Normal heart sounds.  Pulmonary:     Effort: Pulmonary effort is normal.     Breath sounds: Normal breath sounds.  Skin:    General: Skin is warm and dry.  Neurological:     Mental Status: She is alert and oriented to person, place, and time.     Lab Results Lab Results  Component Value Date   WBC 7.6 03/27/2024   HGB 8.4 (L) 03/27/2024   HCT 25.6 (L) 03/27/2024   MCV 94.1 03/27/2024   PLT 184 03/27/2024    Lab Results  Component Value Date   CREATININE 2.99 (H) 03/28/2024   BUN 35 (H) 03/28/2024   NA 141 03/28/2024   K 4.4 03/28/2024   CL 110 03/28/2024   CO2 21 (L) 03/28/2024    Lab Results  Component Value Date   ALT 25 03/22/2024   AST 27 03/22/2024   ALKPHOS 47 03/22/2024   BILITOT 0.5 03/22/2024     Microbiology: Recent Results (from the past 240 hours)   Culture, blood (Routine X 2) w Reflex to ID Panel     Status: None   Collection Time: 03/22/24  8:50 PM   Specimen: BLOOD LEFT HAND  Result Value Ref Range Status   Specimen Description BLOOD LEFT HAND  Final   Special Requests   Final    BOTTLES DRAWN AEROBIC AND ANAEROBIC Blood Culture adequate volume   Culture   Final    NO GROWTH 5 DAYS Performed at [facility], [address], [place], [place] [address]    Report Status 03/27/2024 FINAL  Final  Culture, blood (Routine X 2) w Reflex to ID Panel     Status: None   Collection Time: 03/22/24  9:21 PM   Specimen: BLOOD LEFT HAND  Result Value Ref Range  Status   Specimen Description BLOOD LEFT HAND  Final   Special Requests   Final    BOTTLES DRAWN AEROBIC AND ANAEROBIC Blood Culture adequate volume   Culture   Final    NO GROWTH 5 DAYS Performed at [facility], [address] [place], [place] [address]    Report Status 03/27/2024 FINAL  Final   I have personally spent 28 minutes involved in face-to-face and non-face-to-face activities for this patient on the day of the visit. Professional time spent includes the following activities: preparing to see the patient (review of tests), performing a medically appropriate examination, ordering medications, communicating with other health care professionals, documenting clinical information in the EMR, communicating results and counseling patient regarding medication and plan of care, and care coordination.    Greg Jie Stickels, NP [facility]  03/28/2024  3:12 PM

## 2024-03-29 ENCOUNTER — Telehealth: Payer: Self-pay | Admitting: *Deleted

## 2024-03-29 NOTE — Transitions of Care (Post Inpatient/ED Visit) (Signed)
 03/29/2024  Name: Diane Watkins MRN: [id number] DOB: 1950-11-28  Today's TOC FU Call Status: Today's TOC FU Call Status:: Successful TOC FU Call Completed TOC FU Call Complete Date: 03/29/24 Patient's Name and Date of Birth confirmed.  Transition Care Management Follow-up Telephone Call Date of Discharge: 03/28/24 Discharge Facility: [facility]) Type of Discharge: Inpatient Admission Primary Inpatient Discharge Diagnosis:: AKI How have you been since you were released from the hospital?: Better (eating, drinking well, ambulating with walker) Any questions or concerns?: No  Items Reviewed: Did you receive and understand the discharge instructions provided?: Yes Medications obtained,verified, and reconciled?: Yes (Medications Reviewed) Any new allergies since your discharge?: No Dietary orders reviewed?: Yes Type of Diet Ordered:: heart healthy Do you have support at home?: Yes People in Home [RPT]: spouse Name of Support/Comfort Primary Source: Marcey Spearman Reviewed signs /symptoms of infection Reviewed importance of staying well hydrated  Medications Reviewed Today: Medications Reviewed Today     Reviewed by Aura Mliss LABOR, RN (Registered Nurse) on 03/29/24 at 1436  Med List Status: <None>   Medication Order Taking? Sig Documenting Provider Last Dose Status Informant  amLODipine (NORVASC) 10 MG tablet [id number] Yes Take 1 tablet (10 mg total) by mouth daily. Al-Sultani, Anmar, MD  Active   B Complex-C (B-COMPLEX WITH VITAMIN C) tablet [id number] Yes Take 1 tablet by mouth daily. Maurice Sharlet GORMAN, PA-C  Active Family Member, Pharmacy Records  cetirizine (ZYRTEC) 10 MG tablet [id number] Yes Take 10 mg by mouth at bedtime. [provider]  Active Pharmacy Records, Family Member  cyanocobalamin  (VITAMIN B12) 1000 MCG tablet [id number] Yes Take 1,000 mcg by mouth in the morning. [provider]  Active Family Member, Pharmacy Records  diclofenac  Sodium  (VOLTAREN ) 1 % GEL [id number] Yes Apply 2 g topically 4 (four) times daily. Maurice Sharlet GORMAN, PA-C  Active Family Member, Pharmacy Records  docusate sodium  (COLACE) 100 MG capsule [id number] Yes Take 1 capsule (100 mg total) by mouth daily. Maurice Sharlet GORMAN, PA-C  Active Family Member, Pharmacy Records  ertapenem [facility]) IVPB [id number] Yes Inject 500 mg into the vein daily for 17 days. Indication:  Enterobacter cloacae bacteremia/lumbar hardware infection First Dose: Yes Last Day of Therapy:  04/14/2024 Labs - Once weekly:  CBC/D and BMP, Labs - Once weekly: ESR and CRP Method of administration: Mini-Bag Plus / Gravity Method of administration may be changed at the discretion of home infusion pharmacist based upon assessment of the patient and/or caregiver's ability to self-administer the medication ordered. Al-Sultani, Anmar, MD  Active   gabapentin  (NEURONTIN ) 100 MG capsule [id number]  Take 2 capsules (200 mg total) by mouth 3 (three) times daily.  Patient not taking: Reported on 03/29/2024   Maurice Sharlet GORMAN, PA-C  Active Family Member, Pharmacy Records  iron polysaccharides (NIFEREX) 150 MG capsule [id number] Yes Take 1 capsule (150 mg total) by mouth daily. Al-Sultani, Anmar, MD  Active   lisinopril  (ZESTRIL ) 20 MG tablet [id number]  Take 1 tablet (20 mg total) by mouth every evening.  Patient not taking: Reported on 03/29/2024   Maurice Sharlet GORMAN, PA-C  Active Family Member, Pharmacy Records  magnesium  gluconate (MAGONATE) 500 (27 Mg) MG TABS tablet [id number] Yes Take 0.5 tablets (250 mg total) by mouth at bedtime. Maurice Sharlet GORMAN, PA-C  Active Family Member, Pharmacy Records  metoprolol  tartrate (LOPRESSOR ) 25 MG tablet [id number] Yes Take 0.5 tablets (12.5 mg total) by mouth 2 (two) times daily. Love, Sharlet GORMAN, PA-C  Active Family Member, Pharmacy  Records  omega-3 acid ethyl esters (LOVAZA ) 1 g capsule [id number] Yes Take 2 capsules (2 g total) by mouth 2 (two) times daily. Cook, Jayce G, DO  Active  Pharmacy Records, Family Member  Polyethyl Glycol-Propyl Glycol 0.4-0.3 % SOLN [id number] Yes Place 1-2 drops into both eyes 3 (three) times daily as needed (pain.). [provider]  Active Pharmacy Records, Family Member           Med Note SOILA LYLE JAYSON [place] Aug 29, 2021 11:28 AM)    polyethylene glycol powder (GLYCOLAX /MIRALAX ) 17 GM/SCOOP powder [id number] Yes Mix 17 g (1 capful) in 4-8 oz of liquid and take by mouth daily. Maurice Sharlet RAMAN, PA-C  Active Family Member, Pharmacy Records  prochlorperazine  (COMPAZINE ) 5 MG tablet [id number] Yes Take 1-2 tablets (5-10 mg total) by mouth every 6 (six) hours as needed for nausea. Maurice Sharlet RAMAN, PA-C  Active Family Member, Pharmacy Records  rosuvastatin  (CRESTOR ) 40 MG tablet [id number] Yes Take 1 tablet (40 mg total) by mouth every evening. Maurice Sharlet RAMAN, PA-C  Active Family Member, Pharmacy Records  sodium bicarbonate 650 MG tablet [id number] Yes Take 1 tablet (650 mg total) by mouth 2 (two) times daily for 14 days. Al-Sultani, Anmar, MD  Active   traZODone  (DESYREL ) 50 MG tablet [id number]  Take 0.5-1 tablets (25-50 mg total) by mouth at bedtime as needed for sleep.  Patient not taking: Reported on 03/29/2024   Maurice Sharlet RAMAN, PA-C  Active Family Member, Pharmacy Records  Med List Note Sandie Rosaria PARAS, CPhT 03/22/24 1515): Mliss (daughter-in-law) [phone]              Home Care and Equipment/Supplies: Were Home Health Services Ordered?: Yes Name of Home Health Agency:: [facility] Has Agency set up a time to come to your home?: Yes First Home Health Visit Date: 03/30/24 (pt states she is independent with antibiotic administration) Any new equipment or medical supplies ordered?: No  Functional Questionnaire: Do you need assistance with bathing/showering or dressing?: No Do you need assistance with meal preparation?: No Do you need assistance with eating?: No Do you have difficulty maintaining continence: No Do you need  assistance with getting out of bed/getting out of a chair/moving?: Yes (walker) Do you have difficulty managing or taking your medications?: No  Follow up appointments reviewed: PCP Follow-up appointment confirmed?: Yes Date of PCP follow-up appointment?: 04/04/24 Follow-up Provider: Jayce Cook DO Specialist [facility] Follow-up appointment confirmed?: Yes Date of Specialist follow-up appointment?: 04/12/24 Follow-Up Specialty Provider:: ID    Dr. Dennise Do you need transportation to your follow-up appointment?: No Do you understand care options if your condition(s) worsen?: Yes-patient verbalized understanding  SDOH Interventions Today    Flowsheet Row Most Recent Value  SDOH Interventions   Food Insecurity Interventions Intervention Not Indicated  Housing Interventions Intervention Not Indicated  Transportation Interventions Intervention Not Indicated  Utilities Interventions Intervention Not Indicated    Mliss Creed [facility], BSN RN Care Manager/ Transition of Care [facility] Population [facility] [phone]

## 2024-03-30 ENCOUNTER — Encounter (INDEPENDENT_AMBULATORY_CARE_PROVIDER_SITE_OTHER): Payer: Self-pay | Admitting: Gastroenterology

## 2024-03-30 DIAGNOSIS — R291 Meningismus: Secondary | ICD-10-CM | POA: Diagnosis not present

## 2024-03-30 NOTE — Discharge Summary (Signed)
 Physician Discharge Summary   Patient: Diane Watkins MRN: [id number] DOB: 10-Mar-1951  Admit date:     03/22/2024  Discharge date: 03/28/2024  Discharge Physician: Duffy Al-Sultani   PCP: Cook, Jayce G, DO   Recommendations at discharge:  {Tip this will not be part of the note when signed- Example include specific recommendations for outpatient follow-up, pending tests to follow-up on. (Optional):26781} Continue to replenish your body with fluids to keep up your hydration level Call your PCP and make an appointment to see them in 1 week to repeat labs including electrolytes, kidney function, and bicarbonate levels as well as discuss any medication changes that occurred at discharge from this hospital Hold your home Lisinopril  until your kidney function recovers and you are instructed to resume it by your PCP Keep a daily blood pressure log - measure your blood pressure once in the morning and once at night. Take your log with you to your PCP appointment so they can make any necessary adjustments  Discharge Diagnoses: Principal Problem:   AKI (acute kidney injury) Active Problems:   Nausea and vomiting   Essential hypertension, benign   Hyperlipidemia   Controlled type 2 diabetes mellitus without complication, without long-term current use of insulin  (HCC)   S/P lumbar fusion   History of bacteremia - enterobacter cloacae from 02-20-2024.   Obesity (BMI 30-39.9)  Resolved Problems:   Acute metabolic encephalopathy  Admission HPI: CC: dehydration HPI: Diane Watkins is a 73 y.o. female with medical history significant of hypertension, hyperlipidemia, diabetes mellitus type 2, arthritis, and recent transforaminal lumbar decompression and fusion at L4-5 on 02/08/2024 presents with nausea and vomiting.   She was just recently hospitalized from 8/31-9/10 after presenting with headache following her recent surgical procedure.  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   Patient underwent MRI which revealed persistent leak on 9/1 and underwent exploration of the lumbar wound with closure of dural defect by Dr. Joshua on 9/3.  Blood cultures from 9/6 positive for Enterobacter Cloacae for which ID was consulted and recommended 8 weeks of IV antibiotics for which a PICC line was placed on 9/9.  She went to rehab following her hospitalization.     She reports that the nausea and vomiting started last night after eating and drinking, but also mentions having a pattern of eating and then experiencing nausea for a couple of days. She has not had a chance to eat or drink anything today. No stomach pain or diarrhea. She confirms urination without discomfort and has not experienced any recent falls. No fevers have been noted.   Her husband, who lives with her, has been eating the same food but has not experienced similar symptoms. She mentions taking a medication similar to omeprazole, prescribed for stomach issues. Occasional shortness of breath is reported.   Over the phone patient's husband notes that she has been a little altered at times and talking out of her head.   In the ED patient was noted to be afebrile with heart rates elevated up to 115, and all other vital signs maintained.  Labs significant for hemoglobin 9.8, BUN 53, and creatinine 2.53.  CT scan of the head did not reveal any acute intracranial abnormality.  CT scan of the abdomen and pelvis noted no acute abnormality with posterior rod and pedicle screw fixation at L4-L5 with stranding density posterior likely related to recent surgery with no discrete fluid collection appreciated.  Patient was bolused 2 L of IV fluids and Zofran .  TRH consulted to admit.  Assessment and Plan:  # AKI 2/2 to combination of AIN from cefepime  and prerenal from lisinopril  and hypovolemia Patient presented with creatinine elevated to 2.53 with BUN 53.  Baseline creatinine around 0.8.  - UA was negative for casts.   - FeNa (10/10) -  5.1%  - PVR negative for urinary retention - Her creatinine continued to rise despite IVFs, nephrology was consulted. Etiology was hought to be most likely due to combination of dehydration in the setting of poor oral intake, nausea, and vomiting, the possibility of interstitial nephritis from cefepime . - Ultimately creatinine plateaued at 3.49-3.52 and then began to slow improve. - Nephrology recommended maintaining a renal diet.  - Her is expected to hopefully continue to gradually improve with time and continued appropriate PO intake, cessation of nephrotoxic agents such as lisinopril  until recovery, and change in antibiotics. In addition to the weekly labs she will be getting as part of her OPAT, she is to follow up with her PCP within 1 week of discharge to repeat her labs and ensure continued trend in the correct direction.   #NAGMA - Likely secondary to acute renal failure - Was started on PO sodium bicarb while hospitalized. Was also prescribed PO sodium bicarb on discharge. She is to take this until resolution of her acidosis. Therefore, will need follow up with PCP for repeat labs and monitoring.    # Nausea and vomiting -- resolved Patient presented with symptoms of nausea vomiting for few days.  Also noted her husband ate similar food without any issue. - CT abdomen pelvis showed no acute intra-abdominal abnormalities to explain the patient's symptoms - Her nausea and vomiting resolved. However, she continued to have relatively poor intake due to lack of interest in available food options on renal diet.     # Acute toxic metabolic encephalopathy - resolved # Cefepime  neurotoxicity likely - The patient presented with some confusion and involuntary jerking movements - CT head negative for acute intracranial findings - Etiology most likely multifactorial in the setting of acute renal failure complicated by cefepime  neurotoxicity, with improvement after discontinuation of cefepime  -  Gabapentin  discontinued to avoid confounding effects specially in the setting of decreased renal clearance - At the time of discharge, the patient was AAOx4 and had no further involuntary jerking movements or shaking   # History of Enterobacter cloacae bacteremia - Diagnosed with Enterobacter cloacae bacteremia on 02/20/2024. Evaluated by ID with recommendations for 8 weeks of IV antibiotics for which a right upper extremity PICC line was placed on 02/23/2024 with the patient discharged on IV cefepime  - ID consulted due to concerns for cefepime  toxicity - antibiotics were initially revised to meropenem and ultimately changed to ertapenem - BCx (03/22/2024) showing no growth to date - Continue IV ertapenem as outpatient per ID instructions. OPAT setup at discharge.    # Status post lumbar fusion Patient with history of CSF leak after decompression and fusion surgery at L4-L5 on 8/25.  Patient underwent exploration of lumbar wound with closure of dural deficit on 9/3 after MRI noted findings suggestive of a CSF leak.  CT scan of the abdomen pelvis noted posterior rod and pedicle screw fixation at L4-L5 but strandy density posteriorly thought to likely be related to the recent surgery with no discrete fluid collection present. - Stable at discharge   # Normocytic anemia - Hemoglobin stable, baseline - Continue PO Fe supplementation  - PCP to repeat labs and assess need for continued supplementation   #  NID-T2DM - Hemoglobin A1c 7 (02/03/2024) - Stable   #Hypertension - Home lisinopril  held due to AKI - Continue metoprolol  12.5 mg twice daily - Continue amlodipine tp 10 mg daily - Patient instructed to keep ambulatory BP log, measure BP ideally twice daily around the same time (AM and PM) and to bring that log with them to their next PCP    #Hyperlipidemia - Continue home atorvastatin     {Tip this will not be part of the note when signed Body mass index is 38.15 kg/m. , ,   (Optional):26781}  {(NOTE) Pain control PDMP Statment (Optional):26782} Consultants: *** Procedures performed: ***  Disposition: {Plan; Disposition:26390} Diet recommendation:  Discharge Diet Orders (From admission, onward)     Start     Ordered   03/28/24 0000  Diet - low sodium heart healthy        03/28/24 1710           {Diet_Plan:26776} DISCHARGE MEDICATION: Allergies as of 03/28/2024       Reactions   Amoxil [amoxicillin] Other (See Comments)   Bad headaches Has patient had a PCN reaction causing immediate rash, facial/tongue/throat swelling, SOB or lightheadedness with hypotension: No Has patient had a PCN reaction causing severe rash involving mucus membranes or skin necrosis: No Has patient had a PCN reaction that required hospitalization: No Has patient had a PCN reaction occurring within the last 10 years: No If all of the above answers are NO, then may proceed with Cephalosporin use.   Oxycodone -acetaminophen  Itching        Medication List     PAUSE taking these medications    gabapentin  100 MG capsule Wait to take this until your doctor or other care provider tells you to start again. Commonly known as: NEURONTIN  Take 2 capsules (200 mg total) by mouth 3 (three) times daily. What changed:  how much to take when to take this   lisinopril  20 MG tablet Wait to take this until your doctor or other care provider tells you to start again. We held this blood pressure medication because of the decline in your kidney function. Please continue to hold this until your PCP tells you otherwise.  Commonly known as: ZESTRIL  Take 1 tablet (20 mg total) by mouth every evening.       STOP taking these medications    ceFEPime  IVPB Commonly known as: MAXIPIME    cyclobenzaprine  10 MG tablet Commonly known as: FLEXERIL    topiramate  25 MG tablet Commonly known as: TOPAMAX    traMADol  50 MG tablet Commonly known as: ULTRAM        TAKE these medications     amLODipine 10 MG tablet Commonly known as: NORVASC Take 1 tablet (10 mg total) by mouth daily.   B-complex with vitamin C tablet Take 1 tablet by mouth daily.   cetirizine 10 MG tablet Commonly known as: ZYRTEC Take 10 mg by mouth at bedtime.   cyanocobalamin  1000 MCG tablet Commonly known as: VITAMIN B12 Take 1,000 mcg by mouth in the morning.   diclofenac  Sodium 1 % Gel Commonly known as: VOLTAREN  Apply 2 g topically 4 (four) times daily. What changed:  when to take this reasons to take this   docusate sodium  100 MG capsule Commonly known as: COLACE Take 1 capsule (100 mg total) by mouth daily. What changed: when to take this   ertapenem IVPB Commonly known as: INVANZ Inject 500 mg into the vein daily for 17 days. Indication:  Enterobacter cloacae bacteremia/lumbar hardware infection First Dose:  Yes Last Day of Therapy:  04/14/2024 Labs - Once weekly:  CBC/D and BMP, Labs - Once weekly: ESR and CRP Method of administration: Mini-Bag Plus / Gravity Method of administration may be changed at the discretion of home infusion pharmacist based upon assessment of the patient and/or caregiver's ability to self-administer the medication ordered.   Ferrex 150 150 MG capsule Generic drug: iron polysaccharides Take 1 capsule (150 mg total) by mouth daily.   Mag-G 500 (27 Mg) MG Tabs tablet Generic drug: magnesium  gluconate Take 0.5 tablets (250 mg total) by mouth at bedtime.   metoprolol  tartrate 25 MG tablet Commonly known as: LOPRESSOR  Take 0.5 tablets (12.5 mg total) by mouth 2 (two) times daily.   omega-3 acid ethyl esters 1 g capsule Commonly known as: LOVAZA  Take 2 capsules (2 g total) by mouth 2 (two) times daily.   Polyethyl Glycol-Propyl Glycol 0.4-0.3 % Soln Place 1-2 drops into both eyes 3 (three) times daily as needed (pain.).   polyethylene glycol powder 17 GM/SCOOP powder Commonly known as: GLYCOLAX /MIRALAX  Mix 17 g (1 capful) in 4-8 oz of liquid and  take by mouth daily. What changed:  when to take this reasons to take this   prochlorperazine  5 MG tablet Commonly known as: COMPAZINE  Take 1-2 tablets (5-10 mg total) by mouth every 6 (six) hours as needed for nausea.   rosuvastatin  40 MG tablet Commonly known as: CRESTOR  Take 1 tablet (40 mg total) by mouth every evening.   sodium bicarbonate 650 MG tablet Take 1 tablet (650 mg total) by mouth 2 (two) times daily for 14 days.   traZODone  50 MG tablet Commonly known as: DESYREL  Take 0.5-1 tablets (25-50 mg total) by mouth at bedtime as needed for sleep.               Discharge Care Instructions  (From admission, onward)           Start     Ordered   03/28/24 0000  Change dressing on IV access line weekly and PRN  (Home infusion instructions - [facility] )        03/28/24 1656            Follow-up Information     [facility], [facility] Follow up.   Specialty: Home Health Services Why: Someone will call you to schedule resumption of care visit. Contact information: [address] [place] [address] [phone]         [facility] [address] [place], [place] (DME) [facility]ba [facility] Follow up.   Specialty: DME Services Why: Someone will call you  to schedule first home visit. Contact information: [address] [place]  [address] [phone]        Cook, Jayce G, DO. Schedule an appointment as soon as possible for a visit in 1 week(s).   Specialty: Family Medicine Contact information: [address] [place] [address] [phone]         Dea Shiner, MD Follow up in 2 week(s).   Specialty: Infectious Diseases Contact information: [address] [place] [address] [phone]                Discharge Exam: Diane Watkins   03/22/24 1225 03/22/24 1914 03/27/24 0436  Weight: 93 kg 92.3 kg 94.6 kg   ***  Condition at discharge: {DC  Condition:26389}  The results of significant diagnostics from this hospitalization (including imaging, microbiology, ancillary and laboratory) are listed  below for reference.   Imaging Studies: DG Chest Port 1 View Result Date: 03/22/2024 CLINICAL DATA:  Dehydration EXAM: PORTABLE CHEST 1 VIEW COMPARISON:  02/20/2024 FINDINGS: Cardiac shadow is stable. Right PICC is noted in satisfactory position. The lungs are clear bilaterally. No bony abnormality is noted. IMPRESSION: No active disease. Electronically Signed   By: Oneil Devonshire M.D.   On: 03/22/2024 23:22   CT ABDOMEN PELVIS WO CONTRAST Result Date: 03/22/2024 CLINICAL DATA:  Acute abdominal pain. EXAM: CT ABDOMEN AND PELVIS WITHOUT CONTRAST TECHNIQUE: Multidetector CT imaging of the abdomen and pelvis was performed following the standard protocol without IV contrast. RADIATION DOSE REDUCTION: This exam was performed according to the departmental dose-optimization program which includes automated exposure control, adjustment of the mA and/or kV according to patient size and/or use of iterative reconstruction technique. COMPARISON:  CT 02/23/2024 FINDINGS: Lower chest: Hypoventilatory changes in the lung bases. Hepatobiliary: No focal liver abnormality is seen. Status post cholecystectomy. No biliary dilatation. Pancreas: No ductal dilatation or inflammation. Spleen: Normal in size without focal abnormality. Adrenals/Urinary Tract: Normal adrenal glands. No hydronephrosis, renal calculi, or evidence of renal inflammation. Unremarkable urinary bladder. Stomach/Bowel: Small hiatal hernia. Duodenal diverticulum. No bowel obstruction or inflammation. Moderate colonic stool burden. Left colonic diverticulosis without diverticulitis. Diminutive appendix tentatively visualized. Vascular/Lymphatic: Aortic and branch atherosclerosis. No aortic aneurysm. No enlarged lymph nodes in the abdomen or pelvis. Reproductive: Uterus and bilateral adnexa are unremarkable.  Other: No free air or ascites. Diminutive fat containing umbilical hernia. Tiny fat containing ventral hernia in the supraumbilical region just to the right of midline. Musculoskeletal: Posterior rod and pedicle screw fixation L4-L5 with strandy density posteriorly, likely related to recent surgery. No discrete fluid collection. IMPRESSION: 1. No acute abnormality in the abdomen/pelvis. 2. Colonic diverticulosis without diverticulitis. 3. Small hiatal hernia. 4. Posterior rod and pedicle screw fixation L4-L5 with strandy density posteriorly, likely related to recent surgery. No discrete fluid collection. Aortic Atherosclerosis (ICD10-I70.0). Electronically Signed   By: Andrea Gasman M.D.   On: 03/22/2024 14:27   CT Head Wo Contrast Result Date: 03/22/2024 CLINICAL DATA:  Subdural hemorrhage EXAM: CT HEAD WITHOUT CONTRAST TECHNIQUE: Contiguous axial images were obtained from the base of the skull through the vertex without intravenous contrast. RADIATION DOSE REDUCTION: This exam was performed according to the departmental dose-optimization program which includes automated exposure control, adjustment of the mA and/or kV according to patient size and/or use of iterative reconstruction technique. COMPARISON:  02/14/2024 FINDINGS: Brain: No evidence of acute infarction, hemorrhage, hydrocephalus, extra-axial collection or mass lesion/mass effect. Scattered tiny foci of intracranial lipid identified by prior CT and MRI are unchanged (series 2, image 13 11, 14). Vascular: No hyperdense vessel or unexpected calcification. Skull: Normal. Negative for fracture or focal lesion. Sinuses/Orbits: No acute finding. Other: None. IMPRESSION: 1. No acute intracranial pathology. 2. Scattered tiny foci of intracranial lipid identified by prior CT and MRI are unchanged. Electronically Signed   By: Marolyn JONETTA Jaksch M.D.   On: 03/22/2024 14:25    Microbiology: Results for orders placed or performed during the hospital encounter  of 03/22/24  Culture, blood (Routine X 2) w Reflex to ID Panel     Status: None   Collection Time: 03/22/24  8:50 PM   Specimen: BLOOD LEFT HAND  Result Value Ref Range Status   Specimen Description BLOOD LEFT HAND  Final   Special Requests   Final    BOTTLES DRAWN AEROBIC AND ANAEROBIC Blood Culture adequate volume   Culture  Final    NO GROWTH 5 DAYS Performed at [facility], [address], [place], [place] [address]    Report Status 03/27/2024 FINAL  Final  Culture, blood (Routine X 2) w Reflex to ID Panel     Status: None   Collection Time: 03/22/24  9:21 PM   Specimen: BLOOD LEFT HAND  Result Value Ref Range Status   Specimen Description BLOOD LEFT HAND  Final   Special Requests   Final    BOTTLES DRAWN AEROBIC AND ANAEROBIC Blood Culture adequate volume   Culture   Final    NO GROWTH 5 DAYS Performed at [facility], [address] [place], [place] [address]    Report Status 03/27/2024 FINAL  Final    Labs: CBC: Recent Labs  Lab 03/24/24 1812 03/25/24 0249 03/26/24 0020 03/27/24 0430  WBC 8.1 7.6 7.6 7.6  HGB 10.2* 8.6* 8.4* 8.4*  HCT 31.2* 26.4* 25.5* 25.6*  MCV 93.1 94.0 93.4 94.1  PLT 226 192 181 184   Basic Metabolic Panel: Recent Labs  Lab 03/24/24 1812 03/25/24 0249 03/26/24 0020 03/27/24 0430 03/28/24 0653  NA 137 139 138 144 141  K 5.0 4.6 4.6 4.5 4.4  CL 108 111 112* 112* 110  CO2 18* 18* 18* 20* 21*  GLUCOSE 139* 93 99 97 94  BUN 51* 51* 46* 41* 35*  CREATININE 3.49* 3.52* 3.20* 3.18* 2.99*  CALCIUM  9.4 8.8* 8.7* 9.0 8.9  PHOS  --  4.0 4.1 4.6 4.1   Liver Function Tests: Recent Labs  Lab 03/25/24 0249 03/26/24 0020 03/27/24 0430 03/28/24 0653  ALBUMIN  2.2* 2.3* 2.4* 2.5*   CBG: No results for input(s): GLUCAP in the last 168 hours.  Discharge time spent: 45 minutes  Signed: Duffy Larch, MD [facility] 03/30/2024

## 2024-04-04 ENCOUNTER — Ambulatory Visit (INDEPENDENT_AMBULATORY_CARE_PROVIDER_SITE_OTHER): Payer: Self-pay | Admitting: Family Medicine

## 2024-04-04 ENCOUNTER — Encounter: Payer: Self-pay | Admitting: Family Medicine

## 2024-04-04 VITALS — BP 138/82 | HR 69 | Temp 98.1°F | Ht 62.0 in | Wt 199.2 lb

## 2024-04-04 DIAGNOSIS — D649 Anemia, unspecified: Secondary | ICD-10-CM

## 2024-04-04 DIAGNOSIS — N179 Acute kidney failure, unspecified: Secondary | ICD-10-CM | POA: Diagnosis not present

## 2024-04-04 NOTE — Assessment & Plan Note (Addendum)
 Improving. Labs today to assess creatinine, bicarb.   Continue to hold ACE inhibitor.

## 2024-04-04 NOTE — Progress Notes (Signed)
 Subjective:  Patient ID: Diane Watkins, female    DOB: Sep 27, 1950  Age: 73 y.o. MRN: [id number]  CC:   Chief Complaint  Patient presents with   Hospitalization Follow-up    Pt. Is here for a hospital follow up.      HPI:  73 year old female presents for hospital follow-up.  Patient has had quite a time recently.  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  She went to inpatient rehab after her hospitalization.  Patient recently developed nausea and vomiting and went to the ER for evaluation on 10/7.  In the ER, she was found to have a significant acute kidney injury.  She was subsequently admitted from 10/7 to 10/13.  Creatinine rose significantly and plateaued.  This was thought to be secondary to AIN from cefepime  and prerenal azotemia from lisinopril  and hypovolemia.  Last creatinine was 2.99.  Hospital course, discharge summary, labs, imaging reviewed.  Patient presents today for follow-up.  She states that she is still feeling fatigued but seems to be doing better.  Appetite improving.  No nausea or vomiting.  Patient needs labs today.  Patient Active Problem List   Diagnosis Date Noted   Anemia 04/04/2024   AKI (acute kidney injury) 03/22/2024   History of bacteremia - enterobacter cloacae from 02-20-2024. 03/22/2024   Obesity (BMI 30-39.9) 03/22/2024   S/P lumbar fusion 02/08/2024   Controlled type 2 diabetes mellitus without complication, without long-term current use of insulin  (HCC) 12/28/2023   GERD (gastroesophageal reflux disease) 11/14/2021   Hyperlipidemia 07/09/2019   Primary osteoarthritis of both knees 05/01/2015   Osteopenia 05/17/2014   Essential hypertension, benign 05/04/2013    Social Hx   Social History   Socioeconomic History   Marital status: Married    Spouse name: Not on file   Number of children: Not on file   Years of education: Not on file   Highest  education level: 12th grade  Occupational History   Not on file  Tobacco Use   Smoking status: Never    Passive exposure: Past   Smokeless tobacco: Never  Vaping Use   Vaping status: Never Used  Substance and Sexual Activity   Alcohol  use: Not Currently    Comment: rare - social   Drug use: No   Sexual activity: Yes    Birth control/protection: Post-menopausal  Other Topics Concern   Not on file  Social History Narrative   Not on file   Social Drivers of Health   Financial Resource Strain: Low Risk  (08/03/2023)   Overall Financial Resource Strain (CARDIA)    Difficulty of Paying Living Expenses: Not hard at all  Food Insecurity: No Food Insecurity (03/29/2024)   Hunger Vital Sign    Worried About Running Out of Food in the Last Year: Never true    Ran Out of Food in the Last Year: Never true  Transportation Needs: No Transportation Needs (03/29/2024)   PRAPARE - Administrator, Civil Service (Medical): No    Lack of Transportation (Non-Medical): No  Physical Activity: Insufficiently Active (08/03/2023)   Exercise Vital Sign    Days of Exercise per Week: 1 day    Minutes of Exercise per Session: 10 min  Stress: No Stress Concern Present (08/03/2023)   [employer] of Occupational Health - Occupational Stress Questionnaire    Feeling of Stress : Not at all  Social Connections: Moderately Integrated (03/22/2024)   Social  Connection and Isolation Panel    Frequency of Communication with Friends and Family: More than three times a week    Frequency of Social Gatherings with Friends and Family: Three times a week    Attends Religious Services: More than 4 times per year    Active Member of Clubs or Organizations: No    Attends Banker Meetings: Never    Marital Status: Married    Review of Systems Per HPI  Objective:  BP 138/82 (BP Location: Left Arm, Patient Position: Sitting)   Pulse 69   Temp 98.1 F (36.7 C)   Ht 5' 2 (1.575 m)   Wt  199 lb 4 oz (90.4 kg)   SpO2 95%   BMI 36.44 kg/m      04/04/2024    9:58 AM 03/28/2024    4:14 PM 03/28/2024    1:22 PM  BP/Weight  Systolic BP 138 139 147  Diastolic BP 82 64 68  Wt. (Lbs) 199.25    BMI 36.44 kg/m2      Physical Exam Vitals and nursing note reviewed.  Constitutional:      General: She is not in acute distress.    Appearance: Normal appearance. She is obese.  HENT:     Head: Normocephalic and atraumatic.  Eyes:     General:        Right eye: No discharge.        Left eye: No discharge.     Conjunctiva/sclera: Conjunctivae normal.  Cardiovascular:     Rate and Rhythm: Normal rate and regular rhythm.  Pulmonary:     Effort: Pulmonary effort is normal.     Breath sounds: Normal breath sounds. No wheezing, rhonchi or rales.  Neurological:     Mental Status: She is alert.  Psychiatric:        Mood and Affect: Mood normal.        Behavior: Behavior normal.     Lab Results  Component Value Date   WBC 7.6 03/27/2024   HGB 8.4 (L) 03/27/2024   HCT 25.6 (L) 03/27/2024   PLT 184 03/27/2024   GLUCOSE 94 03/28/2024   CHOL 181 08/03/2023   TRIG 223 (H) 08/03/2023   HDL 49 08/03/2023   LDLCALC 94 08/03/2023   ALT 25 03/22/2024   AST 27 03/22/2024   NA 141 03/28/2024   K 4.4 03/28/2024   CL 110 03/28/2024   CREATININE 2.99 (H) 03/28/2024   BUN 35 (H) 03/28/2024   CO2 21 (L) 03/28/2024   TSH 4.775 (H) 02/19/2024   HGBA1C 7.0 (H) 02/03/2024     Assessment & Plan:  AKI (acute kidney injury) Assessment & Plan: Improving. Labs today to assess creatinine, bicarb.   Continue to hold ACE inhibitor.  Orders: -     CBC -     CMP14+EGFR  Anemia, unspecified type Assessment & Plan: Rechecking CBC today.     Follow-up:   1 month  Mell Guia DO [facility]

## 2024-04-04 NOTE — Patient Instructions (Signed)
Labs today.    Follow up in 1 month

## 2024-04-04 NOTE — Assessment & Plan Note (Signed)
Rechecking CBC today

## 2024-04-05 ENCOUNTER — Ambulatory Visit: Payer: Self-pay | Admitting: Family Medicine

## 2024-04-05 DIAGNOSIS — R7881 Bacteremia: Secondary | ICD-10-CM | POA: Diagnosis not present

## 2024-04-05 DIAGNOSIS — T8463XA Infection and inflammatory reaction due to internal fixation device of spine, initial encounter: Secondary | ICD-10-CM | POA: Diagnosis not present

## 2024-04-05 DIAGNOSIS — G039 Meningitis, unspecified: Secondary | ICD-10-CM | POA: Diagnosis not present

## 2024-04-05 LAB — CMP14+EGFR
ALT: 22 IU/L (ref 0–32)
AST: 24 IU/L (ref 0–40)
Albumin: 4.2 g/dL (ref 3.8–4.8)
Alkaline Phosphatase: 64 IU/L (ref 49–135)
BUN/Creatinine Ratio: 12 (ref 12–28)
BUN: 24 mg/dL (ref 8–27)
Bilirubin Total: 0.3 mg/dL (ref 0.0–1.2)
CO2: 24 mmol/L (ref 20–29)
Calcium: 9.6 mg/dL (ref 8.7–10.3)
Chloride: 106 mmol/L (ref 96–106)
Creatinine, Ser: 1.97 mg/dL — ABNORMAL HIGH (ref 0.57–1.00)
Globulin, Total: 2.2 g/dL (ref 1.5–4.5)
Glucose: 104 mg/dL — ABNORMAL HIGH (ref 70–99)
Potassium: 4.8 mmol/L (ref 3.5–5.2)
Sodium: 144 mmol/L (ref 134–144)
Total Protein: 6.4 g/dL (ref 6.0–8.5)
eGFR: 27 mL/min/1.73 — ABNORMAL LOW (ref >59)

## 2024-04-05 LAB — CBC
Hematocrit: 31.1 % — ABNORMAL LOW (ref 34.0–46.6)
Hemoglobin: 9.9 g/dL — ABNORMAL LOW (ref 11.1–15.9)
MCH: 30.6 pg (ref 26.6–33.0)
MCHC: 31.8 g/dL (ref 31.5–35.7)
MCV: 96 fL (ref 79–97)
Platelets: 323 x10E3/uL (ref 150–450)
RBC: 3.24 x10E6/uL — ABNORMAL LOW (ref 3.77–5.28)
RDW: 13.8 % (ref 11.7–15.4)
WBC: 9.5 x10E3/uL (ref 3.4–10.8)

## 2024-04-09 DIAGNOSIS — R291 Meningismus: Secondary | ICD-10-CM | POA: Diagnosis not present

## 2024-04-11 ENCOUNTER — Inpatient Hospital Stay: Admitting: Physical Medicine and Rehabilitation

## 2024-04-11 DIAGNOSIS — G009 Bacterial meningitis, unspecified: Secondary | ICD-10-CM | POA: Diagnosis not present

## 2024-04-11 DIAGNOSIS — T8463XA Infection and inflammatory reaction due to internal fixation device of spine, initial encounter: Secondary | ICD-10-CM | POA: Diagnosis not present

## 2024-04-11 DIAGNOSIS — B9689 Other specified bacterial agents as the cause of diseases classified elsewhere: Secondary | ICD-10-CM | POA: Diagnosis not present

## 2024-04-12 ENCOUNTER — Telehealth: Payer: Self-pay

## 2024-04-12 ENCOUNTER — Encounter: Payer: Self-pay | Admitting: Internal Medicine

## 2024-04-12 ENCOUNTER — Ambulatory Visit (INDEPENDENT_AMBULATORY_CARE_PROVIDER_SITE_OTHER): Admitting: Internal Medicine

## 2024-04-12 ENCOUNTER — Encounter: Payer: Self-pay | Admitting: Family Medicine

## 2024-04-12 ENCOUNTER — Other Ambulatory Visit: Payer: Self-pay | Admitting: Family Medicine

## 2024-04-12 ENCOUNTER — Other Ambulatory Visit: Payer: Self-pay

## 2024-04-12 VITALS — BP 124/76 | HR 92 | Temp 97.7°F | Ht 62.0 in | Wt 198.0 lb

## 2024-04-12 DIAGNOSIS — B9689 Other specified bacterial agents as the cause of diseases classified elsewhere: Secondary | ICD-10-CM | POA: Diagnosis not present

## 2024-04-12 DIAGNOSIS — G9600 Cerebrospinal fluid leak, unspecified: Secondary | ICD-10-CM

## 2024-04-12 DIAGNOSIS — Z981 Arthrodesis status: Secondary | ICD-10-CM

## 2024-04-12 DIAGNOSIS — N179 Acute kidney failure, unspecified: Secondary | ICD-10-CM | POA: Diagnosis not present

## 2024-04-12 DIAGNOSIS — T8140XA Infection following a procedure, unspecified, initial encounter: Secondary | ICD-10-CM | POA: Diagnosis not present

## 2024-04-12 DIAGNOSIS — R7881 Bacteremia: Secondary | ICD-10-CM

## 2024-04-12 MED ORDER — CIPROFLOXACIN HCL 500 MG PO TABS: 500.0000 mg | ORAL_TABLET | Freq: Two times a day (BID) | ORAL | 5 refills | Status: DC

## 2024-04-12 MED ORDER — METOPROLOL TARTRATE 25 MG PO TABS: 12.5000 mg | ORAL_TABLET | Freq: Two times a day (BID) | ORAL | 1 refills | Status: AC

## 2024-04-12 NOTE — Progress Notes (Signed)
 Patient: Diane Watkins  DOB: Oct 24, 1950 MRN: [id number] PCP: Bluford Jacqulyn MATSU, DO    Chief Complaint  Patient presents with   Follow-up     Patient Active Problem List   Diagnosis Date Noted   Anemia 04/04/2024   AKI (acute kidney injury) 03/22/2024   History of bacteremia - enterobacter cloacae from 02-20-2024. 03/22/2024   Obesity (BMI 30-39.9) 03/22/2024   S/P lumbar fusion 02/08/2024   Controlled type 2 diabetes mellitus without complication, without long-term current use of insulin  (HCC) 12/28/2023   GERD (gastroesophageal reflux disease) 11/14/2021   Hyperlipidemia 07/09/2019   Primary osteoarthritis of both knees 05/01/2015   Osteopenia 05/17/2014   Essential hypertension, benign 05/04/2013     Subjective:  Diane Watkins is a 73 y.o. female with past medical history of hypertension, diabetes, transforaminal lumbar decompression and fusion at L4-L5 presents for hospital follow-up Enterobacter cloacae bacteremia secondary to possible CSF leak.  Patient was discharged on cefepime  for 8 weeks EOT 11/1.  She returned back to the ED for neurotoxicity secondary to cefepime  in the setting of AKI.  Transition to ertapenem.  Today no new complaints.  Able to answer questions.  Review of Systems  All other systems reviewed and are negative.   Past Medical History:  Diagnosis Date   Arthritis    Diabetes mellitus without complication (HCC)    High cholesterol    History of hiatal hernia    Hyperlipidemia    Hypertension    Postoperative CSF leak 02/24/2024    Outpatient Medications Prior to Visit  Medication Sig Dispense Refill   amLODipine (NORVASC) 10 MG tablet Take 1 tablet (10 mg total) by mouth daily. 30 tablet 0   B Complex-C (B-COMPLEX WITH VITAMIN C) tablet Take 1 tablet by mouth daily. 30 tablet 0   cetirizine (ZYRTEC) 10 MG tablet Take 10 mg by mouth at bedtime.     cyanocobalamin  (VITAMIN B12) 1000 MCG tablet Take 1,000 mcg by mouth in the morning.      docusate sodium  (COLACE) 100 MG capsule Take 1 capsule (100 mg total) by mouth daily. 30 capsule 0   ertapenem (INVANZ) IVPB Inject 500 mg into the vein daily for 17 days. Indication:  Enterobacter cloacae bacteremia/lumbar hardware infection First Dose: Yes Last Day of Therapy:  04/14/2024 Labs - Once weekly:  CBC/D and BMP, Labs - Once weekly: ESR and CRP Method of administration: Mini-Bag Plus / Gravity Method of administration may be changed at the discretion of home infusion pharmacist based upon assessment of the patient and/or caregiver's ability to self-administer the medication ordered. 17 Units 0   iron polysaccharides (NIFEREX) 150 MG capsule Take 1 capsule (150 mg total) by mouth daily. 30 capsule 0   magnesium  gluconate (MAGONATE) 500 (27 Mg) MG TABS tablet Take 0.5 tablets (250 mg total) by mouth at bedtime. 30 tablet 0   metoprolol  tartrate (LOPRESSOR ) 25 MG tablet Take 0.5 tablets (12.5 mg total) by mouth 2 (two) times daily. 30 tablet 0   omega-3 acid ethyl esters (LOVAZA ) 1 g capsule Take 2 capsules (2 g total) by mouth 2 (two) times daily. 360 capsule 3   Polyethyl Glycol-Propyl Glycol 0.4-0.3 % SOLN Place 1-2 drops into both eyes 3 (three) times daily as needed (pain.).     prochlorperazine  (COMPAZINE ) 5 MG tablet Take 1-2 tablets (5-10 mg total) by mouth every 6 (six) hours as needed for nausea. 30 tablet 0   rosuvastatin  (CRESTOR ) 40 MG tablet Take 1  tablet (40 mg total) by mouth every evening.     diclofenac  Sodium (VOLTAREN ) 1 % GEL Apply 2 g topically 4 (four) times daily. (Patient not taking: Reported on 04/12/2024) 350 g 0   polyethylene glycol powder (GLYCOLAX /MIRALAX ) 17 GM/SCOOP powder Mix 17 g (1 capful) in 4-8 oz of liquid and take by mouth daily. (Patient not taking: Reported on 04/12/2024) 238 g 0   No facility-administered medications prior to visit.     Allergies  Allergen Reactions   Amoxil [Amoxicillin] Other (See Comments)    Bad headaches Has patient  had a PCN reaction causing immediate rash, facial/tongue/throat swelling, SOB or lightheadedness with hypotension: No Has patient had a PCN reaction causing severe rash involving mucus membranes or skin necrosis: No Has patient had a PCN reaction that required hospitalization: No Has patient had a PCN reaction occurring within the last 10 years: No If all of the above answers are NO, then may proceed with Cephalosporin use.    Oxycodone -Acetaminophen  Itching   Cefepime      Neurotoxicity and nephrotoxicity     Social History   Tobacco Use   Smoking status: Never    Passive exposure: Past   Smokeless tobacco: Never  Vaping Use   Vaping status: Never Used  Substance Use Topics   Alcohol  use: Not Currently    Comment: rare - social   Drug use: No    Family History  Problem Relation Age of Onset   Stroke Mother    Alcohol  abuse Father    Heart disease Father 11       MI    Objective:   Vitals:   04/12/24 1341  Weight: 198 lb (89.8 kg)  Height: 5' 2 (1.575 m)   Body mass index is 36.21 kg/m.  Physical Exam Constitutional:      Appearance: Normal appearance.  HENT:     Head: Normocephalic and atraumatic.     Right Ear: Tympanic membrane normal.     Left Ear: Tympanic membrane normal.     Nose: Nose normal.     Mouth/Throat:     Mouth: Mucous membranes are moist.  Eyes:     Extraocular Movements: Extraocular movements intact.     Conjunctiva/sclera: Conjunctivae normal.     Pupils: Pupils are equal, round, and reactive to light.  Cardiovascular:     Rate and Rhythm: Normal rate and regular rhythm.     Heart sounds: No murmur heard.    No friction rub. No gallop.  Pulmonary:     Effort: Pulmonary effort is normal.     Breath sounds: Normal breath sounds.  Abdominal:     General: Abdomen is flat.     Palpations: Abdomen is soft.  Skin:    General: Skin is warm and dry.  Neurological:     General: No focal deficit present.     Mental Status: She is alert  and oriented to person, place, and time.  Psychiatric:        Mood and Affect: Mood normal.     Lab Results: Lab Results  Component Value Date   WBC 9.5 04/04/2024   HGB 9.9 (L) 04/04/2024   HCT 31.1 (L) 04/04/2024   MCV 96 04/04/2024   PLT 323 04/04/2024    Lab Results  Component Value Date   CREATININE 1.97 (H) 04/04/2024   BUN 24 04/04/2024   NA 144 04/04/2024   K 4.8 04/04/2024   CL 106 04/04/2024   CO2 24 04/04/2024  Lab Results  Component Value Date   ALT 22 04/04/2024   AST 24 04/04/2024   ALKPHOS 64 04/04/2024   BILITOT 0.3 04/04/2024     Assessment & Plan:  #Enterobacter cloacae bacteremia in the setting of CSF leak secondary to postop infection  #c/vf cefepime  tox with AIN #AKI, c/f ckd Hx of Enterobacter cloacae bacteremia after L4-L5 fusion in August 2025 status post exploration and closure with plan for 8 weeks of IV cefepime  followed by 6 months of suppressive antibiotics with ciprofloxacin as hardware is in place  c/b N/V and tremors with aki c/f cefepime  tox. Changed to ertapenem to complete course EOT 10/30 for ertapenem ->start cipro suppression 500mg  po bid. Avoid bactrim due to recovering AKI  #PICC #Med management Pull picc after last dose of abx Labs 10/21: wbc 8.2, scr 1.75esr 35, crp 2 F/ u in 2 weeks to assess on suppressive cipro   Loney Stank, MD [facility]   04/12/24  1:45 PM I have personally spent 45 minutes involved in face-to-face and non-face-to-face activities for this patient on the day of the visit. Professional time spent includes the following activities: Preparing to see the patient (review of tests), Obtaining and/or reviewing separately obtained history (admission/discharge record), Performing a medically appropriate examination and/or evaluation , Ordering medications/tests/procedures, referring and communicating with other health care professionals, Documenting  clinical information in the EMR, Independently interpreting results (not separately reported), Communicating results to the patient/family/caregiver, Counseling and educating the patient/family/caregiver and Care coordination (not separately reported).

## 2024-04-12 NOTE — Telephone Encounter (Signed)
 Per Dr. Dennise, okay to pull PICC after last dose on 10/30. Orders sent to Holley Herring, RN with [employer].   Danija Gosa, BSN, RN

## 2024-04-13 ENCOUNTER — Inpatient Hospital Stay: Admitting: Internal Medicine

## 2024-04-13 NOTE — Telephone Encounter (Signed)
 Pam with [employer] called. States [facility] RN wanted to double check that picc can be pulled as WBC is trending upward. Routing to provider and pharmacy team to review labs.  Shirely Toren, BSN, RN

## 2024-04-13 NOTE — Telephone Encounter (Signed)
 For reference -  WBC tend - 9/30 8; 10/7 9.2; 10/20 9.5; 10/21 8.2; 10/27 11.4

## 2024-04-14 NOTE — Telephone Encounter (Signed)
 Sent provider's message to Holley Herring, RN with [employer].   Nakima Fluegge, BSN, RN

## 2024-04-14 NOTE — Telephone Encounter (Signed)
 Pull picc after last dose of abx as planned. Slight increase in wbc 8.2-> 11.4 is not clinically significant in absence of other findings

## 2024-04-23 ENCOUNTER — Encounter: Payer: Self-pay | Admitting: Family Medicine

## 2024-04-25 ENCOUNTER — Other Ambulatory Visit: Payer: Self-pay | Admitting: Family Medicine

## 2024-04-25 MED ORDER — AMLODIPINE BESYLATE 10 MG PO TABS: 10.0000 mg | ORAL_TABLET | Freq: Every day | ORAL | 3 refills | Status: AC

## 2024-04-28 ENCOUNTER — Other Ambulatory Visit: Payer: Self-pay

## 2024-04-28 ENCOUNTER — Ambulatory Visit: Admitting: Internal Medicine

## 2024-04-28 ENCOUNTER — Encounter: Payer: Self-pay | Admitting: Internal Medicine

## 2024-04-28 VITALS — BP 135/82 | HR 95 | Temp 98.6°F | Wt 198.0 lb

## 2024-04-28 DIAGNOSIS — N179 Acute kidney failure, unspecified: Secondary | ICD-10-CM | POA: Diagnosis not present

## 2024-04-28 DIAGNOSIS — Z981 Arthrodesis status: Secondary | ICD-10-CM

## 2024-04-28 DIAGNOSIS — B9621 Shiga toxin-producing Escherichia coli [E. coli] (STEC) O157 as the cause of diseases classified elsewhere: Secondary | ICD-10-CM

## 2024-04-28 DIAGNOSIS — R7881 Bacteremia: Secondary | ICD-10-CM

## 2024-04-28 DIAGNOSIS — G9600 Cerebrospinal fluid leak, unspecified: Secondary | ICD-10-CM

## 2024-04-28 MED ORDER — CIPROFLOXACIN HCL 500 MG PO TABS: 500.0000 mg | ORAL_TABLET | Freq: Two times a day (BID) | ORAL | 5 refills | Status: AC

## 2024-04-28 NOTE — Progress Notes (Signed)
 Patient: Diane Watkins  DOB: [birth year] MRN: [id number] PCP: Cook, Jayce G, DO    Patient Active Problem List   Diagnosis Date Noted   Anemia 04/04/2024   AKI (acute kidney injury) 03/22/2024   History of bacteremia - enterobacter cloacae from 02-20-2024. 03/22/2024   Obesity (BMI 30-39.9) 03/22/2024   S/P lumbar fusion 02/08/2024   Controlled type 2 diabetes mellitus without complication, without long-term current use of insulin  (HCC) 12/28/2023   GERD (gastroesophageal reflux disease) 11/14/2021   Hyperlipidemia 07/09/2019   Primary osteoarthritis of both knees 05/01/2015   Osteopenia 05/17/2014   Essential hypertension, benign 05/04/2013     Subjective:  Diane Watkins is a 73 y.o. female with past medical history of hypertension, diabetes, transforaminal lumbar decompression and fusion at L4-L5 presents for hospital follow-up Enterobacter cloacae bacteremia secondary to possible CSF leak. Patient was discharged on cefepime  for 8 weeks EOT 11/1. She returned back to the ED for neurotoxicity secondary to cefepime  in the setting of AKI. Transition to ertapenem . Today: tolerating abx. .   Review of Systems  All other systems reviewed and are negative.   Past Medical History:  Diagnosis Date   Arthritis    Diabetes mellitus without complication (HCC)    High cholesterol    History of hiatal hernia    Hyperlipidemia    Hypertension    Postoperative CSF leak 02/24/2024    Outpatient Medications Prior to Visit  Medication Sig Dispense Refill   amLODipine  (NORVASC ) 10 MG tablet Take 1 tablet (10 mg total) by mouth daily. 90 tablet 3   B Complex-C (B-COMPLEX WITH VITAMIN C) tablet Take 1 tablet by mouth daily. 30 tablet 0   cetirizine (ZYRTEC) 10 MG tablet Take 10 mg by mouth at bedtime.     ciprofloxacin  (CIPRO ) 500 MG tablet Take 1 tablet (500 mg total) by mouth 2 (two) times daily. 60 tablet 5   cyanocobalamin  (VITAMIN B12) 1000 MCG tablet Take 1,000 mcg by mouth in  the morning.     docusate sodium  (COLACE) 100 MG capsule Take 1 capsule (100 mg total) by mouth daily. 30 capsule 0   metoprolol  tartrate (LOPRESSOR ) 25 MG tablet Take 0.5 tablets (12.5 mg total) by mouth 2 (two) times daily. 180 tablet 1   omega-3 acid ethyl esters (LOVAZA ) 1 g capsule Take 2 capsules (2 g total) by mouth 2 (two) times daily. 360 capsule 3   Polyethyl Glycol-Propyl Glycol 0.4-0.3 % SOLN Place 1-2 drops into both eyes 3 (three) times daily as needed (pain.).     polyethylene glycol powder (GLYCOLAX /MIRALAX ) 17 GM/SCOOP powder Mix 17 g (1 capful) in 4-8 oz of liquid and take by mouth daily. (Patient not taking: Reported on 04/12/2024) 238 g 0   prochlorperazine  (COMPAZINE ) 5 MG tablet Take 1-2 tablets (5-10 mg total) by mouth every 6 (six) hours as needed for nausea. 30 tablet 0   rosuvastatin  (CRESTOR ) 40 MG tablet Take 1 tablet (40 mg total) by mouth every evening.     No facility-administered medications prior to visit.     Allergies  Allergen Reactions   Amoxil [Amoxicillin] Other (See Comments)    Bad headaches Has patient had a PCN reaction causing immediate rash, facial/tongue/throat swelling, SOB or lightheadedness with hypotension: No Has patient had a PCN reaction causing severe rash involving mucus membranes or skin necrosis: No Has patient had a PCN reaction that required hospitalization: No Has patient had a PCN reaction occurring within the last 10 years:  No If all of the above answers are NO, then may proceed with Cephalosporin use.    Oxycodone -Acetaminophen  Itching   Cefepime      Neurotoxicity and nephrotoxicity     Social History   Tobacco Use   Smoking status: Never    Passive exposure: Past   Smokeless tobacco: Never  Vaping Use   Vaping status: Never Used  Substance Use Topics   Alcohol  use: Not Currently    Comment: rare - social   Drug use: No    Family History  Problem Relation Age of Onset   Stroke Mother    Alcohol  abuse Father     Heart disease Father 71       MI    Objective:  There were no vitals filed for this visit. There is no height or weight on file to calculate BMI.  Physical Exam Constitutional:      Appearance: Normal appearance.  HENT:     Head: Normocephalic and atraumatic.     Right Ear: Tympanic membrane normal.     Left Ear: Tympanic membrane normal.     Nose: Nose normal.     Mouth/Throat:     Mouth: Mucous membranes are moist.  Eyes:     Extraocular Movements: Extraocular movements intact.     Conjunctiva/sclera: Conjunctivae normal.     Pupils: Pupils are equal, round, and reactive to light.  Cardiovascular:     Rate and Rhythm: Normal rate and regular rhythm.     Heart sounds: No murmur heard.    No friction rub. No gallop.  Pulmonary:     Effort: Pulmonary effort is normal.     Breath sounds: Normal breath sounds.  Abdominal:     General: Abdomen is flat.     Palpations: Abdomen is soft.  Musculoskeletal:        General: Normal range of motion.  Skin:    General: Skin is warm and dry.  Neurological:     General: No focal deficit present.     Mental Status: She is alert and oriented to person, place, and time.  Psychiatric:        Mood and Affect: Mood normal.     Lab Results: Lab Results  Component Value Date   WBC 9.5 04/04/2024   HGB 9.9 (L) 04/04/2024   HCT 31.1 (L) 04/04/2024   MCV 96 04/04/2024   PLT 323 04/04/2024    Lab Results  Component Value Date   CREATININE 1.97 (H) 04/04/2024   BUN 24 04/04/2024   NA 144 04/04/2024   K 4.8 04/04/2024   CL 106 04/04/2024   CO2 24 04/04/2024    Lab Results  Component Value Date   ALT 22 04/04/2024   AST 24 04/04/2024   ALKPHOS 64 04/04/2024   BILITOT 0.3 04/04/2024     Assessment & Plan:  #Enterobacter cloacae bacteremia in the setting of CSF leak secondary to postop infection  #c/vf cefepime  tox with AIN #AKI, c/f ckd Hx of Enterobacter cloacae bacteremia after L4-L5 fusion in August 2025 status post  exploration and closure with plan for 8 weeks of IV cefepime  followed by 6 months of suppressive antibiotics with ciprofloxacin  as hardware is in place  c/b N/V and tremors with aki c/f cefepime  tox. Changed to ertapenem  to complete course EOT 10/30 for ertapenem  ->started cipro  suppression 500mg  po bid. Avoid bactrim due to recovering AKI. 03/23/24 ecg qtc 490, 440 on 02/03/24 Plan -continue cipro ( no dirrhea with probiotics) -F/u with NSY(2nd  opion with Dr. Lynwood). Some let side pain( she thinks due to sleep pattern distant from incision site. -Labs today -F/U in 6 months   Loney Stank, MD [facility] I personally spent a total of 46 minutes in the care of the patient today including preparing to see the patient, getting/reviewing separately obtained history, performing a medically appropriate exam/evaluation, counseling and educating, placing orders, documenting clinical information in the EHR, and communicating results.    04/28/24  10:51 AM

## 2024-04-29 LAB — CBC WITH DIFFERENTIAL/PLATELET
Absolute Lymphocytes: 2038 {cells}/uL (ref 850–3900)
Absolute Monocytes: 725 {cells}/uL (ref 200–950)
Basophils Absolute: 88 {cells}/uL (ref 0–200)
Basophils Relative: 0.7 %
Eosinophils Absolute: 2475 {cells}/uL — ABNORMAL HIGH (ref 15–500)
Eosinophils Relative: 19.8 %
HCT: 32.3 % — ABNORMAL LOW (ref 35.0–45.0)
Hemoglobin: 10.6 g/dL — ABNORMAL LOW (ref 11.7–15.5)
MCH: 30.3 pg (ref 27.0–33.0)
MCHC: 32.8 g/dL (ref 32.0–36.0)
MCV: 92.3 fL (ref 80.0–100.0)
MPV: 11 fL (ref 7.5–12.5)
Monocytes Relative: 5.8 %
Neutro Abs: 7175 {cells}/uL (ref 1500–7800)
Neutrophils Relative %: 57.4 %
Platelets: 277 Thousand/uL (ref 140–400)
RBC: 3.5 Million/uL — ABNORMAL LOW (ref 3.80–5.10)
RDW: 14.6 % (ref 11.0–15.0)
Total Lymphocyte: 16.3 %
WBC: 12.5 Thousand/uL — ABNORMAL HIGH (ref 3.8–10.8)

## 2024-04-29 LAB — COMPLETE METABOLIC PANEL WITHOUT GFR
AG Ratio: 1.7 (calc) (ref 1.0–2.5)
ALT: 19 U/L (ref 6–29)
AST: 21 U/L (ref 10–35)
Albumin: 4.1 g/dL (ref 3.6–5.1)
Alkaline phosphatase (APISO): 53 U/L (ref 37–153)
BUN/Creatinine Ratio: 10 (calc) (ref 6–22)
BUN: 14 mg/dL (ref 7–25)
CO2: 27 mmol/L (ref 20–32)
Calcium: 9.9 mg/dL (ref 8.6–10.4)
Chloride: 107 mmol/L (ref 98–110)
Creat: 1.46 mg/dL — ABNORMAL HIGH (ref 0.60–1.00)
Globulin: 2.4 g/dL (ref 1.9–3.7)
Glucose, Bld: 122 mg/dL — ABNORMAL HIGH (ref 65–99)
Potassium: 4.1 mmol/L (ref 3.5–5.3)
Sodium: 142 mmol/L (ref 135–146)
Total Bilirubin: 0.3 mg/dL (ref 0.2–1.2)
Total Protein: 6.5 g/dL (ref 6.1–8.1)

## 2024-04-29 LAB — SEDIMENTATION RATE: Sed Rate: 28 mm/h (ref 0–30)

## 2024-04-29 LAB — C-REACTIVE PROTEIN: CRP: <3 mg/L (ref <8.0)

## 2024-05-02 ENCOUNTER — Encounter: Payer: Self-pay | Admitting: Internal Medicine

## 2024-05-04 NOTE — Telephone Encounter (Signed)
 It can be non-specific. If you are not having rash, or allergic like symptoms we just monitor

## 2024-05-06 ENCOUNTER — Encounter: Payer: Self-pay | Admitting: Family Medicine

## 2024-05-06 ENCOUNTER — Ambulatory Visit: Admitting: Family Medicine

## 2024-05-06 VITALS — BP 128/76 | HR 99 | Temp 98.2°F | Ht 62.0 in | Wt 200.0 lb

## 2024-05-06 DIAGNOSIS — I10 Essential (primary) hypertension: Secondary | ICD-10-CM

## 2024-05-06 DIAGNOSIS — N179 Acute kidney failure, unspecified: Secondary | ICD-10-CM

## 2024-05-06 NOTE — Patient Instructions (Signed)
Follow up in 3 months.  Take care  Dr. Cook  

## 2024-05-08 NOTE — Progress Notes (Signed)
 Subjective:  Patient ID: Diane Watkins, female    DOB: June 17, 1950  Age: 73 y.o. MRN: [id number]  CC:   Chief Complaint  Patient presents with   1 month follow up kidney check    No concerns voiced    HPI:  73 year old female presents for follow up.  Overall, she is improving. Getting strength back. Fatigue improving. Renal function slowly improving. Creatinine now 1.46.   BP well controlled.   Patient Active Problem List   Diagnosis Date Noted   Anemia 04/04/2024   AKI (acute kidney injury) 03/22/2024   History of bacteremia - enterobacter cloacae from 02-20-2024. 03/22/2024   Obesity (BMI 30-39.9) 03/22/2024   S/P lumbar fusion 02/08/2024   Controlled type 2 diabetes mellitus without complication, without long-term current use of insulin  (HCC) 12/28/2023   GERD (gastroesophageal reflux disease) 11/14/2021   Hyperlipidemia 07/09/2019   Primary osteoarthritis of both knees 05/01/2015   Osteopenia 05/17/2014   Essential hypertension, benign 05/04/2013    Social Hx   Social History   Socioeconomic History   Marital status: Married    Spouse name: Not on file   Number of children: Not on file   Years of education: Not on file   Highest education level: 12th grade  Occupational History   Not on file  Tobacco Use   Smoking status: Never    Passive exposure: Past   Smokeless tobacco: Never  Vaping Use   Vaping status: Never Used  Substance and Sexual Activity   Alcohol  use: Not Currently    Comment: rare - social   Drug use: No   Sexual activity: Yes    Birth control/protection: Post-menopausal  Other Topics Concern   Not on file  Social History Narrative   Not on file   Social Drivers of Health   Financial Resource Strain: Low Risk  (08/03/2023)   Overall Financial Resource Strain (CARDIA)    Difficulty of Paying Living Expenses: Not hard at all  Food Insecurity: No Food Insecurity (03/29/2024)   Hunger Vital Sign    Worried About Running Out of Food in  the Last Year: Never true    Ran Out of Food in the Last Year: Never true  Transportation Needs: No Transportation Needs (03/29/2024)   PRAPARE - Administrator, Civil Service (Medical): No    Lack of Transportation (Non-Medical): No  Physical Activity: Insufficiently Active (08/03/2023)   Exercise Vital Sign    Days of Exercise per Week: 1 day    Minutes of Exercise per Session: 10 min  Stress: No Stress Concern Present (08/03/2023)   [employer] of Occupational Health - Occupational Stress Questionnaire    Feeling of Stress : Not at all  Social Connections: Moderately Integrated (03/22/2024)   Social Connection and Isolation Panel    Frequency of Communication with Friends and Family: More than three times a week    Frequency of Social Gatherings with Friends and Family: Three times a week    Attends Religious Services: More than 4 times per year    Active Member of Clubs or Organizations: No    Attends Banker Meetings: Never    Marital Status: Married    Review of Systems Per HPI  Objective:  BP 128/76   Pulse 99   Temp 98.2 F (36.8 C)   Ht 5' 2 (1.575 m)   Wt 200 lb (90.7 kg)   SpO2 97%   BMI 36.58 kg/m  05/06/2024    9:27 AM 04/28/2024   10:52 AM 04/12/2024    1:41 PM  BP/Weight  Systolic BP 128 135 124  Diastolic BP 76 82 76  Wt. (Lbs) 200 198 198  BMI 36.58 kg/m2 36.21 kg/m2 36.21 kg/m2    Physical Exam Vitals and nursing note reviewed.  Constitutional:      General: She is not in acute distress.    Appearance: Normal appearance.  HENT:     Head: Normocephalic and atraumatic.  Eyes:     General:        Right eye: No discharge.        Left eye: No discharge.     Conjunctiva/sclera: Conjunctivae normal.  Cardiovascular:     Rate and Rhythm: Normal rate and regular rhythm.  Pulmonary:     Effort: Pulmonary effort is normal.     Breath sounds: Normal breath sounds. No wheezing, rhonchi or rales.  Neurological:      Mental Status: She is alert.  Psychiatric:        Mood and Affect: Mood normal.        Behavior: Behavior normal.     Lab Results  Component Value Date   WBC 12.5 (H) 04/28/2024   HGB 10.6 (L) 04/28/2024   HCT 32.3 (L) 04/28/2024   PLT 277 04/28/2024   GLUCOSE 122 (H) 04/28/2024   CHOL 181 08/03/2023   TRIG 223 (H) 08/03/2023   HDL 49 08/03/2023   LDLCALC 94 08/03/2023   ALT 19 04/28/2024   AST 21 04/28/2024   NA 142 04/28/2024   K 4.1 04/28/2024   CL 107 04/28/2024   CREATININE 1.46 (H) 04/28/2024   BUN 14 04/28/2024   CO2 27 04/28/2024   TSH 4.775 (H) 02/19/2024   HGBA1C 7.0 (H) 02/03/2024     Assessment & Plan:  AKI (acute kidney injury) Assessment & Plan: Renal function continuing to improved. Advised to increase water intake.   Essential hypertension, benign Assessment & Plan: BP well controlled.  Continue Amlodipine .    Follow-up:  3 months  Nolie Bignell Bluford DO [facility]

## 2024-05-08 NOTE — Assessment & Plan Note (Signed)
 Renal function continuing to improved. Advised to increase water intake.

## 2024-05-08 NOTE — Assessment & Plan Note (Addendum)
 BP well controlled.  Continue Amlodipine.

## 2024-07-20 ENCOUNTER — Other Ambulatory Visit: Payer: Self-pay | Admitting: Family Medicine

## 2024-07-20 DIAGNOSIS — E782 Mixed hyperlipidemia: Secondary | ICD-10-CM

## 2024-07-21 ENCOUNTER — Other Ambulatory Visit: Payer: Self-pay | Admitting: Internal Medicine

## 2024-07-21 ENCOUNTER — Telehealth: Payer: Self-pay

## 2024-07-21 DIAGNOSIS — Z87898 Personal history of other specified conditions: Secondary | ICD-10-CM

## 2024-07-21 MED ORDER — SULFAMETHOXAZOLE-TRIMETHOPRIM 800-160 MG PO TABS: 1.0000 | ORAL_TABLET | Freq: Two times a day (BID) | ORAL | 5 refills | Status: AC

## 2024-07-21 NOTE — Progress Notes (Signed)
 Labs to be done and bactrim  sent

## 2024-07-21 NOTE — Telephone Encounter (Signed)
 We can try bactrim  but she needs to stop by lab either this week or early next week to do labs since she has history of kidney injury and may worsen renal function. Also could you move her f/u with me to 3/19 please.

## 2024-07-21 NOTE — Telephone Encounter (Signed)
 Patient called and states she stopped taking her Cipro  about 2 weeks ago.  She reports having a lot of bilateral leg and foot pain and felt it was a side effect to the Cipro .  Since stopping the antibiotic the pain has improved.  he would like to know how she should precede and prefers not to be on Cipro .  Should she also be seen sooner or have labs done per the patient.  Duwan Adrian [name], CMA

## 2024-07-26 ENCOUNTER — Other Ambulatory Visit: Payer: Self-pay

## 2024-08-04 ENCOUNTER — Ambulatory Visit

## 2024-08-04 ENCOUNTER — Ambulatory Visit: Admitting: Family Medicine

## 2024-09-01 ENCOUNTER — Ambulatory Visit: Admitting: Internal Medicine

## 2024-10-31 ENCOUNTER — Ambulatory Visit: Admitting: Internal Medicine
